# Patient Record
Sex: Female | Born: 1935
Health system: Southern US, Community
[De-identification: ages and names within clinical notes are randomized; demographics above are authoritative.]

## PROBLEM LIST (undated history)

## (undated) DIAGNOSIS — J189 Pneumonia, unspecified organism: Secondary | ICD-10-CM

## (undated) DIAGNOSIS — I10 Essential (primary) hypertension: Secondary | ICD-10-CM

## (undated) DIAGNOSIS — E039 Hypothyroidism, unspecified: Secondary | ICD-10-CM

## (undated) DIAGNOSIS — M7989 Other specified soft tissue disorders: Secondary | ICD-10-CM

## (undated) DIAGNOSIS — F32A Depression, unspecified: Secondary | ICD-10-CM

## (undated) DIAGNOSIS — I499 Cardiac arrhythmia, unspecified: Secondary | ICD-10-CM

## (undated) DIAGNOSIS — M1990 Unspecified osteoarthritis, unspecified site: Secondary | ICD-10-CM

## (undated) DIAGNOSIS — I509 Heart failure, unspecified: Secondary | ICD-10-CM

## (undated) DIAGNOSIS — Z923 Personal history of irradiation: Secondary | ICD-10-CM

## (undated) DIAGNOSIS — E079 Disorder of thyroid, unspecified: Secondary | ICD-10-CM

## (undated) DIAGNOSIS — M869 Osteomyelitis, unspecified: Secondary | ICD-10-CM

## (undated) DIAGNOSIS — Z8719 Personal history of other diseases of the digestive system: Secondary | ICD-10-CM

## (undated) DIAGNOSIS — M8580 Other specified disorders of bone density and structure, unspecified site: Secondary | ICD-10-CM

## (undated) DIAGNOSIS — K469 Unspecified abdominal hernia without obstruction or gangrene: Secondary | ICD-10-CM

## (undated) DIAGNOSIS — F329 Major depressive disorder, single episode, unspecified: Secondary | ICD-10-CM

## (undated) HISTORY — DX: Other specified soft tissue disorders: M79.89

## (undated) HISTORY — PX: BILATERAL SALPINGOOPHORECTOMY: SHX1223

## (undated) HISTORY — DX: Other specified disorders of bone density and structure, unspecified site: M85.80

## (undated) HISTORY — DX: Essential (primary) hypertension: I10

## (undated) HISTORY — DX: Disorder of thyroid, unspecified: E07.9

## (undated) HISTORY — DX: Unspecified abdominal hernia without obstruction or gangrene: K46.9

## (undated) HISTORY — DX: Osteomyelitis, unspecified: M86.9

---

## 1966-03-30 HISTORY — PX: APPENDECTOMY: SHX54

## 1968-03-30 HISTORY — PX: TUMOR REMOVAL: SHX12

## 1970-03-30 HISTORY — PX: ABDOMINAL HYSTERECTOMY: SHX81

## 1978-11-30 HISTORY — PX: CHOLECYSTECTOMY: SHX55

## 1997-11-30 HISTORY — PX: COLONOSCOPY: SHX174

## 1998-07-19 ENCOUNTER — Ambulatory Visit (HOSPITAL_COMMUNITY): Admission: RE | Admit: 1998-07-19 | Discharge: 1998-07-19 | Payer: Self-pay | Admitting: Gastroenterology

## 2001-04-21 ENCOUNTER — Encounter: Payer: Self-pay | Admitting: Family Medicine

## 2001-04-21 ENCOUNTER — Ambulatory Visit (HOSPITAL_COMMUNITY): Admission: RE | Admit: 2001-04-21 | Discharge: 2001-04-21 | Payer: Self-pay | Admitting: Family Medicine

## 2001-05-30 DIAGNOSIS — M869 Osteomyelitis, unspecified: Secondary | ICD-10-CM

## 2001-05-30 HISTORY — DX: Osteomyelitis, unspecified: M86.9

## 2002-05-12 ENCOUNTER — Emergency Department (HOSPITAL_COMMUNITY): Admission: EM | Admit: 2002-05-12 | Discharge: 2002-05-12 | Payer: Self-pay | Admitting: Emergency Medicine

## 2002-06-22 ENCOUNTER — Encounter: Payer: Self-pay | Admitting: Family Medicine

## 2002-06-22 ENCOUNTER — Ambulatory Visit (HOSPITAL_COMMUNITY): Admission: RE | Admit: 2002-06-22 | Discharge: 2002-06-22 | Payer: Self-pay | Admitting: Family Medicine

## 2002-06-29 ENCOUNTER — Ambulatory Visit (HOSPITAL_COMMUNITY): Admission: RE | Admit: 2002-06-29 | Discharge: 2002-06-29 | Payer: Self-pay | Admitting: Family Medicine

## 2003-06-28 ENCOUNTER — Ambulatory Visit (HOSPITAL_COMMUNITY): Admission: RE | Admit: 2003-06-28 | Discharge: 2003-06-28 | Payer: Self-pay | Admitting: Family Medicine

## 2003-06-28 ENCOUNTER — Encounter: Payer: Self-pay | Admitting: Family Medicine

## 2003-09-18 ENCOUNTER — Encounter: Payer: Self-pay | Admitting: Family Medicine

## 2003-09-18 ENCOUNTER — Ambulatory Visit (HOSPITAL_COMMUNITY): Admission: RE | Admit: 2003-09-18 | Discharge: 2003-09-18 | Payer: Self-pay | Admitting: Family Medicine

## 2003-11-06 ENCOUNTER — Ambulatory Visit (HOSPITAL_COMMUNITY): Admission: RE | Admit: 2003-11-06 | Discharge: 2003-11-06 | Payer: Self-pay | Admitting: Gastroenterology

## 2003-11-06 ENCOUNTER — Encounter (INDEPENDENT_AMBULATORY_CARE_PROVIDER_SITE_OTHER): Payer: Self-pay

## 2003-11-06 HISTORY — PX: COLONOSCOPY: SHX174

## 2004-08-20 ENCOUNTER — Ambulatory Visit (HOSPITAL_COMMUNITY): Admission: RE | Admit: 2004-08-20 | Discharge: 2004-08-20 | Payer: Self-pay | Admitting: Family Medicine

## 2005-04-14 ENCOUNTER — Ambulatory Visit (HOSPITAL_COMMUNITY): Admission: RE | Admit: 2005-04-14 | Discharge: 2005-04-14 | Payer: Self-pay | Admitting: Family Medicine

## 2005-09-11 ENCOUNTER — Ambulatory Visit (HOSPITAL_COMMUNITY): Admission: RE | Admit: 2005-09-11 | Discharge: 2005-09-11 | Payer: Self-pay | Admitting: Family Medicine

## 2005-11-11 ENCOUNTER — Ambulatory Visit: Payer: Self-pay | Admitting: Psychiatry

## 2005-11-30 HISTORY — PX: ESOPHAGOGASTRODUODENOSCOPY: SHX1529

## 2005-12-18 ENCOUNTER — Ambulatory Visit (HOSPITAL_COMMUNITY): Admission: RE | Admit: 2005-12-18 | Discharge: 2005-12-18 | Payer: Self-pay | Admitting: Internal Medicine

## 2005-12-18 ENCOUNTER — Ambulatory Visit: Payer: Self-pay | Admitting: Internal Medicine

## 2005-12-21 ENCOUNTER — Ambulatory Visit (HOSPITAL_COMMUNITY): Admission: RE | Admit: 2005-12-21 | Discharge: 2005-12-21 | Payer: Self-pay | Admitting: Family Medicine

## 2005-12-25 ENCOUNTER — Ambulatory Visit: Payer: Self-pay | Admitting: Psychiatry

## 2005-12-28 ENCOUNTER — Ambulatory Visit (HOSPITAL_COMMUNITY): Admission: RE | Admit: 2005-12-28 | Discharge: 2005-12-28 | Payer: Self-pay | Admitting: Family Medicine

## 2005-12-29 ENCOUNTER — Emergency Department (HOSPITAL_COMMUNITY): Admission: EM | Admit: 2005-12-29 | Discharge: 2005-12-29 | Payer: Self-pay | Admitting: Emergency Medicine

## 2006-01-08 ENCOUNTER — Ambulatory Visit: Payer: Self-pay | Admitting: Psychiatry

## 2006-01-28 HISTORY — PX: COLONOSCOPY: SHX174

## 2006-01-29 ENCOUNTER — Ambulatory Visit: Payer: Self-pay | Admitting: Psychiatry

## 2006-02-12 ENCOUNTER — Ambulatory Visit (HOSPITAL_COMMUNITY): Payer: Self-pay | Admitting: Psychiatry

## 2006-02-26 ENCOUNTER — Ambulatory Visit (HOSPITAL_COMMUNITY): Payer: Self-pay | Admitting: Psychiatry

## 2006-03-12 ENCOUNTER — Ambulatory Visit (HOSPITAL_COMMUNITY): Payer: Self-pay | Admitting: Psychiatry

## 2006-04-02 ENCOUNTER — Ambulatory Visit (HOSPITAL_COMMUNITY): Payer: Self-pay | Admitting: Psychiatry

## 2006-05-10 ENCOUNTER — Ambulatory Visit (HOSPITAL_COMMUNITY): Payer: Self-pay | Admitting: Psychiatry

## 2006-06-11 ENCOUNTER — Ambulatory Visit (HOSPITAL_COMMUNITY): Payer: Self-pay | Admitting: Psychiatry

## 2006-06-25 ENCOUNTER — Ambulatory Visit (HOSPITAL_COMMUNITY): Payer: Self-pay | Admitting: Psychiatry

## 2006-07-04 ENCOUNTER — Emergency Department (HOSPITAL_COMMUNITY): Admission: EM | Admit: 2006-07-04 | Discharge: 2006-07-04 | Payer: Self-pay | Admitting: Family Medicine

## 2006-09-22 ENCOUNTER — Ambulatory Visit (HOSPITAL_COMMUNITY): Admission: RE | Admit: 2006-09-22 | Discharge: 2006-09-22 | Payer: Self-pay | Admitting: Family Medicine

## 2006-12-06 ENCOUNTER — Ambulatory Visit (HOSPITAL_COMMUNITY): Payer: Self-pay | Admitting: Psychiatry

## 2006-12-27 ENCOUNTER — Ambulatory Visit (HOSPITAL_COMMUNITY): Payer: Self-pay | Admitting: Psychiatry

## 2007-01-10 ENCOUNTER — Ambulatory Visit (HOSPITAL_COMMUNITY): Payer: Self-pay | Admitting: Psychiatry

## 2007-02-01 ENCOUNTER — Ambulatory Visit (HOSPITAL_COMMUNITY): Payer: Self-pay | Admitting: Psychiatry

## 2007-02-21 ENCOUNTER — Ambulatory Visit (HOSPITAL_COMMUNITY): Payer: Self-pay | Admitting: Psychiatry

## 2007-03-21 ENCOUNTER — Ambulatory Visit (HOSPITAL_COMMUNITY): Payer: Self-pay | Admitting: Psychiatry

## 2007-09-27 ENCOUNTER — Ambulatory Visit (HOSPITAL_COMMUNITY): Admission: RE | Admit: 2007-09-27 | Discharge: 2007-09-27 | Payer: Self-pay | Admitting: Family Medicine

## 2008-09-27 ENCOUNTER — Ambulatory Visit (HOSPITAL_COMMUNITY): Admission: RE | Admit: 2008-09-27 | Discharge: 2008-09-27 | Payer: Self-pay | Admitting: Family Medicine

## 2008-11-13 ENCOUNTER — Ambulatory Visit: Payer: Self-pay | Admitting: Vascular Surgery

## 2008-12-06 ENCOUNTER — Ambulatory Visit: Payer: Self-pay | Admitting: Vascular Surgery

## 2008-12-20 ENCOUNTER — Ambulatory Visit: Payer: Self-pay | Admitting: Vascular Surgery

## 2009-03-30 DIAGNOSIS — M8580 Other specified disorders of bone density and structure, unspecified site: Secondary | ICD-10-CM

## 2009-03-30 HISTORY — DX: Other specified disorders of bone density and structure, unspecified site: M85.80

## 2009-04-23 ENCOUNTER — Ambulatory Visit (HOSPITAL_COMMUNITY): Admission: RE | Admit: 2009-04-23 | Discharge: 2009-04-23 | Payer: Self-pay | Admitting: Family Medicine

## 2009-10-09 ENCOUNTER — Ambulatory Visit (HOSPITAL_COMMUNITY): Admission: RE | Admit: 2009-10-09 | Discharge: 2009-10-09 | Payer: Self-pay | Admitting: Family Medicine

## 2009-12-02 ENCOUNTER — Ambulatory Visit (HOSPITAL_COMMUNITY): Admission: RE | Admit: 2009-12-02 | Discharge: 2009-12-02 | Payer: Self-pay | Admitting: Family Medicine

## 2010-06-03 ENCOUNTER — Emergency Department (HOSPITAL_COMMUNITY): Admission: EM | Admit: 2010-06-03 | Discharge: 2010-06-03 | Payer: Self-pay | Admitting: Family Medicine

## 2010-10-10 ENCOUNTER — Ambulatory Visit (HOSPITAL_COMMUNITY): Admission: RE | Admit: 2010-10-10 | Discharge: 2010-10-10 | Payer: Self-pay | Admitting: Family Medicine

## 2011-02-18 ENCOUNTER — Other Ambulatory Visit (HOSPITAL_COMMUNITY): Payer: Self-pay

## 2011-02-27 ENCOUNTER — Ambulatory Visit (HOSPITAL_COMMUNITY): Admission: RE | Admit: 2011-02-27 | Payer: Medicare Other | Source: Ambulatory Visit | Admitting: General Surgery

## 2011-04-14 NOTE — Procedures (Signed)
LOWER EXTREMITY VENOUS REFLUX EXAM   INDICATION:  Left lower extremity pain and spider veins.   EXAM:  Using color-flow imaging and pulse Doppler spectral analysis, the  left common femoral, superficial femoral, popliteal, posterior tibial,  greater and lesser saphenous veins are evaluated.  There is no evidence  suggesting deep venous insufficiency in the left lower extremity.   The left saphenofemoral junction is competent.  The left GSV is  competent with the caliber as described below.   The left proximal short saphenous vein demonstrates competency.   GSV Diameter (used if found to be incompetent only)                                            Right    Left  Proximal Greater Saphenous Vein           cm       cm  Proximal-to-mid-thigh                     cm       cm  Mid thigh                                 cm       cm  Mid-distal thigh                          cm       cm  Distal thigh                              cm       cm  Knee                                      cm       cm   IMPRESSION:  1. No evidence of reflux noted in the left leg.  2. The left greater saphenous vein is not aneurysmal.  3. The left greater saphenous vein is not tortuous.  4. The deep venous system is competent.  5. The left lesser saphenous vein is competent.  6. No evidence of deep venous thrombosis noted in the left leg.   ___________________________________________  Quita Skye. Hart Rochester, M.D.   MG/MEDQ  D:  11/14/2008  T:  11/14/2008  Job:  [id number]

## 2011-04-14 NOTE — Consult Note (Signed)
VASCULAR SURGERY CONSULTATION   [name], [name]  DOB:  May 29, 1936                                       11/13/2008  EAVWU#:[id number]   The patient is a 75 year old female referred for vascular surgery  consultation by Dr. Gerda Diss for venous insufficiency of the left leg.  This patient has noticed an increasing discomfort in the posterior  aspect of her left leg in the popliteal fossa which she describes as an  aching, cramping discomfort which worsens as the day progresses the more  she is on her feet.  She has occasional mild swelling in the ankles as  the day progresses but has no history of bleeding, ulceration, deep  venous thrombosis, thrombophlebitis or bulging varicosities.  She has no  symptoms in the contralateral right leg.  She has never had a venous  evaluation.  She has noticed a bluish discoloration behind her knee as  well as in her anterior thighs which have become more prominent over the  last several years.   PAST MEDICAL HISTORY:  1. Hypertension.  2. Thyroid disease.  3. Negative for diabetes, coronary artery disease, hyperlipidemia,      COPD or stroke.   PAST SURGICAL HISTORY:  1. Appendectomy.  2. Hiatal hernia repair.  3. Hysterectomy.  4. Removal of bilateral ovaries.   FAMILY HISTORY:  Positive for varicose veins in her mother, coronary  artery disease in her father, diabetes in a sister, negative for stroke.   SOCIAL HISTORY:  She is married, has one child and is retired.  She does  not use tobacco or alcohol.   REVIEW OF SYSTEMS:  Positive for recent weight gain.  She has some  occasional palpitations and peptic ulcer disease, hiatal hernia and some  depression and nervousness.   ALLERGIES:  None known.   MEDICATIONS:  Please see health history form.   PHYSICAL EXAMINATION:  Vital signs:  Blood pressure 167/81, heart rate  62, respirations 14.  General:  She is a female patient who is in no  apparent distress, alert and  oriented x3.  Neck:  Supple, 3+ carotid  pulses palpable.  No bruits are audible.  Neurological:  Normal.  Chest:  Clear to auscultation.  Cardiovascular:  Regular rhythm.  No murmurs.  Abdomen:  Soft, nontender with no masses.  She has 3+ femoral, popliteal  and dorsalis pedis pulses palpable bilaterally.  There is no distal  edema noted.  She does have some prominent reticular and spider veins in  the left popliteal fossa over about a 4 cm in circumference area.  She  also has scattered spider veins in the anterior thigh particularly  medially on the left side.  Right leg has lesser degree of spider veins,  no bulging varicosities noted in either leg.  There is no  hyperpigmentation, ulceration or distal edema noted.   Venous duplex exam of the left leg reveals the deep system to be normal  and no reflux in the superficial system with no valvular incompetence.   I reassured her regarding these findings that her saphenous systems are  normal.  She would like to have sclerotherapy performed for these  reticular and spider veins in the left leg.  We will schedule that in  the near future.   Quita Skye Hart Rochester, M.D.  Electronically Signed  JDL/MEDQ  D:  11/13/2008  T:  11/14/2008  Job:  [id number]   cc:   Lorin Picket A. Gerda Diss, MD

## 2011-04-17 NOTE — Op Note (Signed)
NAMEKHLOE, [name]               ACCOUNT NO.:  [id number]   MEDICAL RECORD NO.:  [id number]          PATIENT TYPE:  AMB   LOCATION:  DAY                           FACILITY:  [facility]   PHYSICIAN:  R. Roetta Sessions, M.D. DATE OF BIRTH:  [birth year]   DATE OF PROCEDURE:  12/18/2005  DATE OF DISCHARGE:                                 OPERATIVE REPORT   PROCEDURE:  Diagnostic esophagogastroduodenoscopy.   INDICATIONS FOR PROCEDURE:  The patient is a 75 year old lady with well  controlled GERD on generic omeprazole ___________ until the night before  last when she got nauseated and heaved several times and brought up a bunch  of black, dark-looking blood in the toilet. She also subsequently had some  darker brown stools the next day. She came to see Dr. Gerda Diss today and found  to her have dark stool, strongly Hemoccult positive, on rectal exam. She has  not had any hematochezia. No further hematemesis. She has remained  hemodynamically stable. She has a hemoglobin of 11 arranged per Dr. Lilyan Punt done through his office. Ms. Greaves has a distant history of peptic  ulcer disease for which she underwent therapeutic endoscopy back in 1990  felt to be related to ibuprofen. She is not taking any ibuprofen, etc., at  this time. She does not recall ever being checked for Helicobacter pylori.  EGD is now being done to further evaluate her symptoms. Approach has been  discussed with the patient at length. Potential risks, benefits, and  alternatives have been reviewed and questions answered. She is agreeable.  Please see documentation in the medical record.   PROCEDURE NOTE:  O2 saturation, blood pressure, pulse, and respirations were  monitored throughout the entire procedure. Conscious sedation with Versed 4  mg IV and Demerol 100 mg IV in divided doses.  Cetacaine spray for topical  oropharyngeal anesthesia.   INSTRUMENT:  Olympus video chip system.   FINDINGS:  Esophagogastroduodenoscopy:   Examination of the tubular esophagus  revealed no mucosa abnormalities. EG junction easily traversed.   Stomach:  Gastric cavity was empty and insufflated well with air. Thorough  examination of gastric mucosa including retroflexed view of the proximal  stomach and esophagogastric junction was undertaken. The patient had  moderate-sized hiatal hernia. The gastric mucosa straddling the  diaphragmatic hiatus was abnormal in three areas. There were three areas of  linear erosions/ulceration straddling the diaphragmatic hiatus perpendicular  to it. Please see photos taken in the retroflexed position. These eroded  ulcerated areas had clean bases. The remainder of the gastric mucosa  appeared normal. Pylorus patent and easily traversed. Examination of bulb  and second portion revealed no abnormalities.   THERAPEUTIC/DIAGNOSTIC MANEUVERS:  None.   The patient tolerated the procedure well and was reactive to endoscopy.   IMPRESSION:  1.  Normal esophagus.  2.  Small to moderate size hiatal hernia.  3.  Three linear eroded ulcerated areas of mucosa straddling the      diaphragmatic hiatus as described above. The remainder of the gastric      mucosa appeared normal. Patent pylorus. Normal D1  and D2.   DISCUSSION:  The appearance of these lesions and location are more  consistent with so-called Sheria Lang lesion which is more or less a trauma-  induced lesion involving the gastric mucosa from the mechanical forces of  sliding back and forth across the diaphragmatic hiatus.   There was no active bleeding seen today.   RECOMMENDATIONS:  1.  Dr. Lilyan Punt has written a prescription for Nexium. I agree with her      going on this medication. She should start it today, Nexium 40 mg orally      daily. Carafate 1 g slurries q.i.d. for the next five days.  2.  Given her documented history of peptic ulcer disease previously and no      history of being checked for Helicobacter pylori, we will go  ahead and      draw serologies today and will treat if positive. I feel Ms. Tieken's      outlook is good.  3.  I told Ms. Plush to check with Dr. Ewing Schlein down in [place] when she is      due for her next colonoscopy.      Jonathon Bellows, M.D.  Electronically Signed     RMR/MEDQ  D:  12/18/2005  T:  12/18/2005  Job:  [id number]   cc:   Lorin Picket A. Gerda Diss, MD  Fax: [phone]

## 2011-04-17 NOTE — Op Note (Signed)
NAME:  [name], [name]                         ACCOUNT NO.:  [id number]   MEDICAL RECORD NO.:  [id number]                   PATIENT TYPE:  AMB   LOCATION:  ENDO                                 FACILITY:  [facility]   PHYSICIAN:  Petra Kuba, M.D.                 DATE OF BIRTH:  [birth year]   DATE OF PROCEDURE:  11/06/2003  DATE OF DISCHARGE:                                 OPERATIVE REPORT   PROCEDURE:  Colonoscopy.   INDICATIONS FOR PROCEDURE:  Screening, history of colon polyps.   Consent was signed after risks, benefits, methods, and options were  thoroughly discussed in the past.   MEDICINES USED:  Demerol 100, Versed 10.   DESCRIPTION OF PROCEDURE:  Rectal inspection was pertinent for external  hemorrhoids small. Digital exam was negative. The pediatric video adjustable  colonoscope was inserted and with abdominal pressure and rolling her on her  back we were able to advance to the cecum. No abnormality was seen on  insertion.  The cecum was identified by the appendiceal orifice and the  ileocecal valve. The scope was slowly withdrawn. The prep was adequate.  There was some liquid stool that required washing and suctioning but on slow  withdrawal through the colon, the cecum, ascending, transverse and  descending were normal.  The scope was withdrawn around the left side of the  colon in the distal sigmoid.  A tiny polyp was seen and was cold biopsied,  also one in the rectum was seen probably hyperplastic, cold biopsied and put  in the same container.  Anorectal pull through and retroflexion confirmed  some small hemorrhoids.  The scope was reinserted a short ways up the left  side of the colon, air was suctioned, scope removed.  The patient tolerated  the procedure well. There was no obvious or immediate complications.   ENDOSCOPIC DIAGNOSIS:  1. Internal and external hemorrhoids.  2. Two tiny rectal distal sigmoid polyps cold biopsied.  3. Otherwise within normal limits to  the cecum.   PLAN:  Await pathology to determine future colonic screening.  Happy to see  back p.r.n. otherwise return care to Dr. Gerda Diss for the customary health  care maintenance to include yearly rectals and guaiacs.                                               Petra Kuba, M.D.    MEM/MEDQ  D:  11/06/2003  T:  11/06/2003  Job:  [id number]   cc:   Lorin Picket A. Gerda Diss, M.D.  [address], [address]  [place]  [place] [address]  Fax: [phone]

## 2011-11-03 ENCOUNTER — Other Ambulatory Visit: Payer: Self-pay | Admitting: Family Medicine

## 2011-11-03 DIAGNOSIS — Z139 Encounter for screening, unspecified: Secondary | ICD-10-CM

## 2011-11-12 ENCOUNTER — Ambulatory Visit (HOSPITAL_COMMUNITY)
Admission: RE | Admit: 2011-11-12 | Discharge: 2011-11-12 | Disposition: A | Discharge status: Home / Self Care | Payer: Medicare Other | Source: Ambulatory Visit | Attending: Family Medicine | Admitting: Family Medicine

## 2011-11-12 DIAGNOSIS — Z1231 Encounter for screening mammogram for malignant neoplasm of breast: Secondary | ICD-10-CM | POA: Insufficient documentation

## 2011-11-12 DIAGNOSIS — Z139 Encounter for screening, unspecified: Secondary | ICD-10-CM

## 2011-12-07 DIAGNOSIS — I10 Essential (primary) hypertension: Secondary | ICD-10-CM | POA: Diagnosis not present

## 2011-12-25 DIAGNOSIS — I10 Essential (primary) hypertension: Secondary | ICD-10-CM | POA: Diagnosis not present

## 2011-12-25 DIAGNOSIS — E039 Hypothyroidism, unspecified: Secondary | ICD-10-CM | POA: Diagnosis not present

## 2011-12-25 DIAGNOSIS — Z79899 Other long term (current) drug therapy: Secondary | ICD-10-CM | POA: Diagnosis not present

## 2012-01-11 DIAGNOSIS — Z0000 Encounter for general adult medical examination without abnormal findings: Secondary | ICD-10-CM | POA: Diagnosis not present

## 2012-04-08 DIAGNOSIS — E039 Hypothyroidism, unspecified: Secondary | ICD-10-CM | POA: Diagnosis not present

## 2012-04-15 ENCOUNTER — Ambulatory Visit (INDEPENDENT_AMBULATORY_CARE_PROVIDER_SITE_OTHER): Payer: Medicare Other | Admitting: Surgery

## 2012-04-15 ENCOUNTER — Encounter (INDEPENDENT_AMBULATORY_CARE_PROVIDER_SITE_OTHER): Payer: Self-pay | Admitting: Surgery

## 2012-04-15 VITALS — BP 128/70 | HR 68 | Temp 97.8°F | Resp 14 | Ht 65.5 in | Wt 188.0 lb

## 2012-04-15 DIAGNOSIS — K469 Unspecified abdominal hernia without obstruction or gangrene: Secondary | ICD-10-CM

## 2012-04-15 NOTE — Progress Notes (Signed)
Chief Complaint:  Right inguinal hernia x 3 years  History of Present Illness:  [name] is an 76 y.o. female referred by Dr. Lilyan Punt with a right inguinal hernia.  She saw Dr. Freida Busman last year who commented on what are probably ventral incisional hernias. These have been bothering her.  Physical exam shows a prominent right inguinal hernia that is reducible. She has an old midline incision with a bulge at the lower umbilicus it could be one or multiple hernias. He would be good to get a CT scan to see what is involved with these hernias whether they need to be repaired where they can be observed. We will therefore get a CT abdomen and pelvis with contrast and I will see her back after that and we will make disposition.  Past Medical History  Diagnosis Date  . Hypertension   . Hernia   . Thyroid disease   . Leg swelling     Past Surgical History  Procedure Date  . Appendectomy 03/1966  . Abdominal hysterectomy 03/1970  . Cholecystectomy 1980  . Tumor removal 03/1968    ovarian    Current Outpatient Prescriptions  Medication Sig Dispense Refill  . ALPRAZolam (XANAX) 0.5 MG tablet Take 0.5 mg by mouth as needed.      Marland Kitchen atenolol (TENORMIN) 50 MG tablet Take 50 mg by mouth daily.      . hydrochlorothiazide (HYDRODIURIL) 25 MG tablet Take 25 mg by mouth daily.      Marland Kitchen levothyroxine (SYNTHROID, LEVOTHROID) 125 MCG tablet Take 125 mcg by mouth daily.      . potassium chloride (K-DUR) 10 MEQ tablet Take 10 mEq by mouth every morning.      . ranitidine (ZANTAC) 300 MG capsule Take 300 mg by mouth every evening.      . venlafaxine XR (EFFEXOR-XR) 150 MG 24 hr capsule Take 150 mg by mouth daily.       Erythromycin; Aspirin; and Ibuprofen Family History  Problem Relation Age of Onset  . Dementia Mother   . Heart disease Father   . Cancer Father     throat  . Cancer Sister     breast   Social History:   reports that she has never smoked. She has never used smokeless tobacco. She  reports that she does not drink alcohol or use illicit drugs.   REVIEW OF SYSTEMS - PERTINENT POSITIVES ONLY: noncontributory  Physical Exam:   Blood pressure 128/70, pulse 68, temperature 97.8 F (36.6 C), temperature source Temporal, resp. rate 14, height 5' 5.5" (1.664 m), weight 188 lb (85.276 kg). Body mass index is 30.81 kg/(m^2).  Gen:  WDWN WF NAD  Neurological: Alert and oriented to person, place, and time. Motor and sensory function is grossly intact  Head: Normocephalic and atraumatic.  Eyes: Conjunctivae are normal. Pupils are equal, round, and reactive to light. No scleral icterus.  Neck: Normal range of motion. Neck supple. No tracheal deviation or thyromegaly present.  Cardiovascular:  SR without murmurs or gallops.  No carotid bruits Respiratory: Effort normal.  No respiratory distress. No chest wall tenderness. Breath sounds normal.  No wheezes, rales or rhonchi.  Abdomen:  Soft areas in lower midline with possible ventral herniae.  Right inguinal hernia is prominent GU: Musculoskeletal: Normal range of motion. Extremities are nontender. No cyanosis, edema or clubbing noted Lymphadenopathy: No cervical, preauricular, postauricular or axillary adenopathy is present Skin: Skin is warm and dry. No rash noted. No diaphoresis. No erythema. No  pallor. Pscyh: Normal mood and affect. Behavior is normal. Judgment and thought content normal.   LABORATORY RESULTS: No results found for this or any previous visit (from the past 48 hour(s)).  RADIOLOGY RESULTS: No results found.  Problem List: There is no problem list on file for this patient.   Assessment & Plan: RIH;  Asymptomatic ventral herniae- will get CT to assess what is in the herniae.      Matt B. Daphine Deutscher, MD, [facility] [phone] beeper [phone]  04/15/2012 3:47 PM

## 2012-04-19 ENCOUNTER — Ambulatory Visit
Admission: RE | Admit: 2012-04-19 | Discharge: 2012-04-19 | Disposition: A | Discharge status: Home / Self Care | Payer: Medicare Other | Source: Ambulatory Visit | Attending: Surgery | Admitting: Surgery

## 2012-04-19 DIAGNOSIS — K449 Diaphragmatic hernia without obstruction or gangrene: Secondary | ICD-10-CM | POA: Diagnosis not present

## 2012-04-19 DIAGNOSIS — K469 Unspecified abdominal hernia without obstruction or gangrene: Secondary | ICD-10-CM

## 2012-04-19 DIAGNOSIS — K4090 Unilateral inguinal hernia, without obstruction or gangrene, not specified as recurrent: Secondary | ICD-10-CM | POA: Diagnosis not present

## 2012-04-19 DIAGNOSIS — R109 Unspecified abdominal pain: Secondary | ICD-10-CM | POA: Diagnosis not present

## 2012-04-19 MED ORDER — IOHEXOL 300 MG/ML  SOLN
100.0000 mL | Freq: Once | INTRAMUSCULAR | Status: AC | PRN
  Administered 2012-04-19: 100 mL via INTRAVENOUS

## 2012-04-27 ENCOUNTER — Ambulatory Visit (INDEPENDENT_AMBULATORY_CARE_PROVIDER_SITE_OTHER): Payer: Medicare Other | Admitting: Surgery

## 2012-04-27 ENCOUNTER — Encounter (INDEPENDENT_AMBULATORY_CARE_PROVIDER_SITE_OTHER): Payer: Self-pay | Admitting: Surgery

## 2012-04-27 VITALS — BP 142/70 | HR 72 | Temp 98.0°F | Resp 18 | Ht 65.5 in | Wt 186.0 lb

## 2012-04-27 DIAGNOSIS — K4090 Unilateral inguinal hernia, without obstruction or gangrene, not specified as recurrent: Secondary | ICD-10-CM | POA: Insufficient documentation

## 2012-04-27 NOTE — Patient Instructions (Signed)
Nausea and vomiting his the hallmark of bowel obstruction. If you develop the symptomsthen feel your right groin make sure he doesn't have incarceration. If you develop the symptoms as always probably better to be checked out rather than try to consider them an intestinal virus.

## 2012-04-27 NOTE — Progress Notes (Signed)
[name] 76 y.o.  Body mass index is 30.48 kg/(m^2).  There is no problem list on file for this patient.   Allergies  Allergen Reactions  . Erythromycin Shortness Of Breath  . Aspirin Other (See Comments)    Caused internal bleeding. Patient had ulcer at the time. Instructed by PCP - Marca Ancona not to take any longer.  . Ibuprofen     Instructed not to take by PCP - Marca Ancona due to reaction to aspirin as well (precautionary).    Past Surgical History  Procedure Date  . Appendectomy 03/1966  . Abdominal hysterectomy 03/1970  . Cholecystectomy 1980  . Tumor removal 03/1968    Sheena Rake, MD, MD No diagnosis found.  [name] returns today and I reviewed her CT scan of her abdomen and pelvis and discussed the findings with her. She does have the right inguinal hernia and we knew she had. No ventral hernias were noted as suggested by Dr. Freida Busman. She does have a hiatal hernia and she has been managing very well for a number of years. She has had inguinal hernia for about 2 years. She is actually getting along very well with that I think would like to manage this without surgery. She knows what to look for in terms of symptoms of bowel obstruction tori be happy to see her again whenever needed should she change her mind will have surgery or if she should become symptomatic. I told her this was a perfectly good decisions and we would be glad to watch this with her. Plan return p.r.n.  Matt B. Daphine Deutscher, MD, [facility] [phone] beeper [phone]  04/27/2012 3:13 PM

## 2012-06-17 DIAGNOSIS — E039 Hypothyroidism, unspecified: Secondary | ICD-10-CM | POA: Diagnosis not present

## 2012-07-13 DIAGNOSIS — H2510 Age-related nuclear cataract, unspecified eye: Secondary | ICD-10-CM | POA: Diagnosis not present

## 2012-07-19 DIAGNOSIS — E039 Hypothyroidism, unspecified: Secondary | ICD-10-CM | POA: Diagnosis not present

## 2012-07-19 DIAGNOSIS — I10 Essential (primary) hypertension: Secondary | ICD-10-CM | POA: Diagnosis not present

## 2012-08-19 DIAGNOSIS — Z23 Encounter for immunization: Secondary | ICD-10-CM | POA: Diagnosis not present

## 2012-12-12 ENCOUNTER — Other Ambulatory Visit: Payer: Self-pay | Admitting: Family Medicine

## 2012-12-12 DIAGNOSIS — Z139 Encounter for screening, unspecified: Secondary | ICD-10-CM

## 2012-12-15 ENCOUNTER — Ambulatory Visit (HOSPITAL_COMMUNITY)
Admission: RE | Admit: 2012-12-15 | Discharge: 2012-12-15 | Disposition: A | Discharge status: Home / Self Care | Payer: Medicare Other | Source: Ambulatory Visit | Attending: Family Medicine | Admitting: Family Medicine

## 2012-12-15 DIAGNOSIS — Z1231 Encounter for screening mammogram for malignant neoplasm of breast: Secondary | ICD-10-CM | POA: Diagnosis not present

## 2012-12-15 DIAGNOSIS — Z139 Encounter for screening, unspecified: Secondary | ICD-10-CM

## 2013-02-01 DIAGNOSIS — Z79899 Other long term (current) drug therapy: Secondary | ICD-10-CM | POA: Diagnosis not present

## 2013-02-01 DIAGNOSIS — M899 Disorder of bone, unspecified: Secondary | ICD-10-CM | POA: Diagnosis not present

## 2013-02-01 DIAGNOSIS — I10 Essential (primary) hypertension: Secondary | ICD-10-CM | POA: Diagnosis not present

## 2013-02-01 DIAGNOSIS — E039 Hypothyroidism, unspecified: Secondary | ICD-10-CM | POA: Diagnosis not present

## 2013-02-01 DIAGNOSIS — E782 Mixed hyperlipidemia: Secondary | ICD-10-CM | POA: Diagnosis not present

## 2013-02-01 DIAGNOSIS — R5381 Other malaise: Secondary | ICD-10-CM | POA: Diagnosis not present

## 2013-02-04 ENCOUNTER — Encounter: Payer: Self-pay | Admitting: *Deleted

## 2013-04-25 DIAGNOSIS — L570 Actinic keratosis: Secondary | ICD-10-CM | POA: Diagnosis not present

## 2013-04-25 DIAGNOSIS — D485 Neoplasm of uncertain behavior of skin: Secondary | ICD-10-CM | POA: Diagnosis not present

## 2013-04-25 DIAGNOSIS — D0460 Carcinoma in situ of skin of unspecified upper limb, including shoulder: Secondary | ICD-10-CM | POA: Diagnosis not present

## 2013-04-25 DIAGNOSIS — L821 Other seborrheic keratosis: Secondary | ICD-10-CM | POA: Diagnosis not present

## 2013-05-11 DIAGNOSIS — C44621 Squamous cell carcinoma of skin of unspecified upper limb, including shoulder: Secondary | ICD-10-CM | POA: Diagnosis not present

## 2013-05-11 DIAGNOSIS — L570 Actinic keratosis: Secondary | ICD-10-CM | POA: Diagnosis not present

## 2013-05-16 ENCOUNTER — Ambulatory Visit (INDEPENDENT_AMBULATORY_CARE_PROVIDER_SITE_OTHER): Payer: Medicare Other | Admitting: Family Medicine

## 2013-05-16 ENCOUNTER — Encounter: Payer: Self-pay | Admitting: Family Medicine

## 2013-05-16 VITALS — BP 130/70 | HR 70 | Wt 188.0 lb

## 2013-05-16 DIAGNOSIS — Z0000 Encounter for general adult medical examination without abnormal findings: Secondary | ICD-10-CM

## 2013-05-16 DIAGNOSIS — E785 Hyperlipidemia, unspecified: Secondary | ICD-10-CM | POA: Diagnosis not present

## 2013-05-16 DIAGNOSIS — I10 Essential (primary) hypertension: Secondary | ICD-10-CM

## 2013-05-16 DIAGNOSIS — E039 Hypothyroidism, unspecified: Secondary | ICD-10-CM | POA: Insufficient documentation

## 2013-05-16 MED ORDER — ALPRAZOLAM 0.5 MG PO TABS: 0.5000 mg | ORAL_TABLET | Freq: Two times a day (BID) | ORAL | Status: DC | PRN

## 2013-05-16 NOTE — Progress Notes (Signed)
  Subjective:    Patient ID: [name], female    DOB: Apr 21, 1936, 77 y.o.   MRN: [id number]  HPI Patient here for a wellness physical. No falls in the last six months. MMSE score 28. WC is 39 inches. Patient states she has no concerns today. Patient overall is doing fairly well she is trying to lose weight is trying to watch diet is trying to exercise. Mental health doing well cognitive doing well safety reviewed dietary reviewed. Family history some neurologic disorders otherwise negative personal history-hypothyroidism, GERD, reflux.  Review of Systems  Constitutional: Negative for activity change, appetite change and fatigue.  HENT: Negative for congestion, rhinorrhea, neck pain and ear discharge.   Eyes: Negative for discharge.  Respiratory: Negative for cough, chest tightness and wheezing.   Cardiovascular: Negative for chest pain.  Gastrointestinal: Negative for vomiting and abdominal pain.  Genitourinary: Negative for frequency and difficulty urinating.  Allergic/Immunologic: Negative for environmental allergies and food allergies.  Neurological: Negative for weakness and headaches.  Psychiatric/Behavioral: Negative for behavioral problems and agitation.       Objective:   Physical Exam  Nursing note and vitals reviewed. Constitutional: She is oriented to person, place, and time. She appears well-developed and well-nourished.  HENT:  Head: Normocephalic.  Right Ear: External ear normal.  Left Ear: External ear normal.  Eyes: Pupils are equal, round, and reactive to light.  Neck: Normal range of motion. No thyromegaly present.  Cardiovascular: Normal rate, regular rhythm, normal heart sounds and intact distal pulses.   No murmur heard. Pulmonary/Chest: Effort normal and breath sounds normal. No respiratory distress. She has no wheezes.  Abdominal: Soft. Bowel sounds are normal. She exhibits no distension and no mass. There is no tenderness.  Genitourinary: Vagina  normal. No vaginal discharge found.  Musculoskeletal: Normal range of motion. She exhibits no edema and no tenderness.  Lymphadenopathy:    She has no cervical adenopathy.  Neurological: She is alert and oriented to person, place, and time. She exhibits normal muscle tone.  Skin: Skin is warm and dry.  Psychiatric: She has a normal mood and affect. Her behavior is normal.   Breast exam was normal.       Assessment & Plan:  Wellness-patient overall doing well continue current medications as is. Lab work toward the end of this year. Followup within 6 months. Safety measures dietary measures all reviewed.

## 2013-07-05 ENCOUNTER — Other Ambulatory Visit: Payer: Self-pay | Admitting: Family Medicine

## 2013-08-18 ENCOUNTER — Other Ambulatory Visit: Payer: Self-pay | Admitting: Family Medicine

## 2013-08-30 ENCOUNTER — Encounter: Payer: Self-pay | Admitting: Family Medicine

## 2013-08-30 ENCOUNTER — Ambulatory Visit (INDEPENDENT_AMBULATORY_CARE_PROVIDER_SITE_OTHER): Payer: Medicare Other | Admitting: Family Medicine

## 2013-08-30 VITALS — BP 122/72 | Temp 98.2°F | Ht 65.5 in | Wt 183.4 lb

## 2013-08-30 DIAGNOSIS — H6120 Impacted cerumen, unspecified ear: Secondary | ICD-10-CM | POA: Diagnosis not present

## 2013-08-30 DIAGNOSIS — H6123 Impacted cerumen, bilateral: Secondary | ICD-10-CM

## 2013-08-30 NOTE — Progress Notes (Signed)
  Subjective:    Patient ID: [name], female    DOB: [birth year], 77 y.o.   MRN: [id number]  Ear Fullness  There is pain in both ears. This is a new problem. The current episode started 1 to 4 weeks ago. The problem occurs constantly. The problem has been gradually worsening. There has been no fever. The patient is experiencing no pain. Associated symptoms include hearing loss. She has tried ear drops (Peroxide) for the symptoms. The treatment provided no relief.      Review of Systems  HENT: Positive for hearing loss.        Objective:   Physical Exam  Bilateral cerumen impaction sinus nontender throat normal neck supple      Assessment & Plan:  Cerumen impaction irrigated successfully both side patient now healed

## 2013-09-12 DIAGNOSIS — Z23 Encounter for immunization: Secondary | ICD-10-CM | POA: Diagnosis not present

## 2013-10-03 ENCOUNTER — Other Ambulatory Visit: Payer: Self-pay | Admitting: Family Medicine

## 2013-10-05 ENCOUNTER — Other Ambulatory Visit: Payer: Self-pay | Admitting: *Deleted

## 2013-10-05 MED ORDER — ALPRAZOLAM 0.5 MG PO TABS: 0.5000 mg | ORAL_TABLET | Freq: Two times a day (BID) | ORAL | Status: DC | PRN

## 2013-10-23 DIAGNOSIS — D485 Neoplasm of uncertain behavior of skin: Secondary | ICD-10-CM | POA: Diagnosis not present

## 2013-10-23 DIAGNOSIS — L570 Actinic keratosis: Secondary | ICD-10-CM | POA: Diagnosis not present

## 2013-10-23 DIAGNOSIS — Z85828 Personal history of other malignant neoplasm of skin: Secondary | ICD-10-CM | POA: Diagnosis not present

## 2013-12-01 ENCOUNTER — Encounter: Payer: Self-pay | Admitting: Family Medicine

## 2013-12-01 ENCOUNTER — Ambulatory Visit (INDEPENDENT_AMBULATORY_CARE_PROVIDER_SITE_OTHER): Payer: Medicare Other | Admitting: Family Medicine

## 2013-12-01 VITALS — BP 130/78 | Ht 65.5 in | Wt 188.4 lb

## 2013-12-01 DIAGNOSIS — E785 Hyperlipidemia, unspecified: Secondary | ICD-10-CM | POA: Diagnosis not present

## 2013-12-01 DIAGNOSIS — E039 Hypothyroidism, unspecified: Secondary | ICD-10-CM

## 2013-12-01 DIAGNOSIS — E559 Vitamin D deficiency, unspecified: Secondary | ICD-10-CM

## 2013-12-01 DIAGNOSIS — I10 Essential (primary) hypertension: Secondary | ICD-10-CM | POA: Diagnosis not present

## 2013-12-01 MED ORDER — ALPRAZOLAM 0.5 MG PO TABS: 0.5000 mg | ORAL_TABLET | Freq: Two times a day (BID) | ORAL | Status: DC | PRN

## 2013-12-01 MED ORDER — VENLAFAXINE HCL ER 150 MG PO CP24: 150.0000 mg | ORAL_CAPSULE | Freq: Every day | ORAL | Status: DC

## 2013-12-01 MED ORDER — POTASSIUM CHLORIDE CRYS ER 10 MEQ PO TBCR: 10.0000 meq | EXTENDED_RELEASE_TABLET | Freq: Every day | ORAL | Status: DC

## 2013-12-01 MED ORDER — RANITIDINE HCL 300 MG PO CAPS: 300.0000 mg | ORAL_CAPSULE | Freq: Every evening | ORAL | Status: DC

## 2013-12-01 MED ORDER — HYDROCHLOROTHIAZIDE 25 MG PO TABS: 25.0000 mg | ORAL_TABLET | Freq: Every day | ORAL | Status: DC

## 2013-12-01 MED ORDER — ATENOLOL 50 MG PO TABS
50.0000 mg | ORAL_TABLET | Freq: Every day | ORAL | Status: DC
Start: 2013-12-01 — End: 2014-06-19

## 2013-12-01 MED ORDER — LEVOTHYROXINE SODIUM 125 MCG PO TABS: 125.0000 ug | ORAL_TABLET | Freq: Every day | ORAL | Status: DC

## 2013-12-01 NOTE — Progress Notes (Signed)
   Subjective:    Patient ID: [name], female    DOB: March 28, 1936, 78 y.o.   MRN: [id number]  HPI Patient arrives for a follow up on blood pressure. Patient reports no concerns or problems-overall doing well. Long discussion held regarding her hypertension or hyperlipidemia hypothyroidism and vitamin D deficiency plus also the importance of exercise healthy eating taking medications on a regular basis patient denies any chest tightness pressure pain shortness breath nausea vomiting diarrhea  Review of Systems  Constitutional: Negative for activity change, appetite change and fatigue.  Endocrine: Negative for polydipsia and polyphagia.  Genitourinary: Negative for frequency.  Neurological: Negative for weakness.  Psychiatric/Behavioral: Negative for confusion.       Objective:   Physical Exam  Vitals reviewed. Constitutional: She appears well-nourished. No distress.  Cardiovascular: Normal rate, regular rhythm and normal heart sounds.   No murmur heard. Pulmonary/Chest: Effort normal and breath sounds normal. No respiratory distress.  Musculoskeletal: She exhibits no edema.  Lymphadenopathy:    She has no cervical adenopathy.  Neurological: She is alert. She exhibits normal muscle tone.  Psychiatric: Her behavior is normal.          Assessment & Plan:  hypothy-continue current medication check lab work. HTN-under good control continue current medication check lab work. Hyperlip-healthy diet regular exercise try to lose weight continue medication check lab work. Vit D def-history of this check vitamin D level may need supplementation Followup 6 months

## 2013-12-05 ENCOUNTER — Other Ambulatory Visit: Payer: Self-pay | Admitting: Family Medicine

## 2013-12-05 DIAGNOSIS — Z139 Encounter for screening, unspecified: Secondary | ICD-10-CM

## 2013-12-18 ENCOUNTER — Ambulatory Visit (HOSPITAL_COMMUNITY)
Admission: RE | Admit: 2013-12-18 | Discharge: 2013-12-18 | Disposition: A | Discharge status: Home / Self Care | Payer: Medicare Other | Source: Ambulatory Visit | Attending: Family Medicine | Admitting: Family Medicine

## 2013-12-18 DIAGNOSIS — Z1231 Encounter for screening mammogram for malignant neoplasm of breast: Secondary | ICD-10-CM | POA: Insufficient documentation

## 2013-12-18 DIAGNOSIS — Z139 Encounter for screening, unspecified: Secondary | ICD-10-CM

## 2014-04-24 DIAGNOSIS — Z85828 Personal history of other malignant neoplasm of skin: Secondary | ICD-10-CM | POA: Diagnosis not present

## 2014-04-24 DIAGNOSIS — L570 Actinic keratosis: Secondary | ICD-10-CM | POA: Diagnosis not present

## 2014-06-11 ENCOUNTER — Telehealth: Payer: Self-pay | Admitting: Family Medicine

## 2014-06-11 DIAGNOSIS — E039 Hypothyroidism, unspecified: Secondary | ICD-10-CM

## 2014-06-11 DIAGNOSIS — R5383 Other fatigue: Secondary | ICD-10-CM

## 2014-06-11 DIAGNOSIS — Z79899 Other long term (current) drug therapy: Secondary | ICD-10-CM

## 2014-06-11 DIAGNOSIS — R5381 Other malaise: Secondary | ICD-10-CM

## 2014-06-11 DIAGNOSIS — E785 Hyperlipidemia, unspecified: Secondary | ICD-10-CM

## 2014-06-11 NOTE — Telephone Encounter (Signed)
Has not had any labs since Epic.

## 2014-06-11 NOTE — Telephone Encounter (Signed)
bloodwork orders ready. Pt notified on voicemail.  

## 2014-06-11 NOTE — Telephone Encounter (Signed)
Pt has appt on 21 July, needs bw orders sent   Call when done

## 2014-06-11 NOTE — Telephone Encounter (Signed)
Lipid, liver, TSH, metabolic 7, CBC

## 2014-06-12 DIAGNOSIS — R5381 Other malaise: Secondary | ICD-10-CM | POA: Diagnosis not present

## 2014-06-12 DIAGNOSIS — E039 Hypothyroidism, unspecified: Secondary | ICD-10-CM | POA: Diagnosis not present

## 2014-06-12 DIAGNOSIS — Z79899 Other long term (current) drug therapy: Secondary | ICD-10-CM | POA: Diagnosis not present

## 2014-06-12 DIAGNOSIS — E785 Hyperlipidemia, unspecified: Secondary | ICD-10-CM | POA: Diagnosis not present

## 2014-06-12 DIAGNOSIS — R5383 Other fatigue: Secondary | ICD-10-CM | POA: Diagnosis not present

## 2014-06-12 LAB — CBC WITH DIFFERENTIAL/PLATELET
Basophils Absolute: 0 10*3/uL (ref 0.0–0.1)
Basophils Relative: 0 % (ref 0–1)
EOS PCT: 4 % (ref 0–5)
Eosinophils Absolute: 0.3 10*3/uL (ref 0.0–0.7)
HEMATOCRIT: 36.9 % (ref 36.0–46.0)
Hemoglobin: 12.7 g/dL (ref 12.0–15.0)
LYMPHS PCT: 47 % — AB (ref 12–46)
Lymphs Abs: 3.4 10*3/uL (ref 0.7–4.0)
MCH: 29.6 pg (ref 26.0–34.0)
MCHC: 34.4 g/dL (ref 30.0–36.0)
MCV: 86 fL (ref 78.0–100.0)
Monocytes Absolute: 0.6 10*3/uL (ref 0.1–1.0)
Monocytes Relative: 8 % (ref 3–12)
Neutro Abs: 3 10*3/uL (ref 1.7–7.7)
Neutrophils Relative %: 41 % — ABNORMAL LOW (ref 43–77)
PLATELETS: 335 10*3/uL (ref 150–400)
RBC: 4.29 MIL/uL (ref 3.87–5.11)
RDW: 13.1 % (ref 11.5–15.5)
WBC: 7.2 10*3/uL (ref 4.0–10.5)

## 2014-06-12 LAB — BASIC METABOLIC PANEL
BUN: 15 mg/dL (ref 6–23)
CO2: 31 mEq/L (ref 19–32)
Calcium: 9 mg/dL (ref 8.4–10.5)
Chloride: 102 mEq/L (ref 96–112)
Creat: 0.78 mg/dL (ref 0.50–1.10)
Glucose, Bld: 94 mg/dL (ref 70–99)
POTASSIUM: 4 meq/L (ref 3.5–5.3)
SODIUM: 141 meq/L (ref 135–145)

## 2014-06-12 LAB — LIPID PANEL
Cholesterol: 164 mg/dL (ref 0–200)
HDL: 69 mg/dL (ref >39)
LDL CALC: 74 mg/dL (ref 0–99)
Total CHOL/HDL Ratio: 2.4 Ratio
Triglycerides: 107 mg/dL (ref <150)
VLDL: 21 mg/dL (ref 0–40)

## 2014-06-12 LAB — HEPATIC FUNCTION PANEL
ALBUMIN: 3.9 g/dL (ref 3.5–5.2)
ALT: 16 U/L (ref 0–35)
AST: 18 U/L (ref 0–37)
Alkaline Phosphatase: 59 U/L (ref 39–117)
Bilirubin, Direct: 0.1 mg/dL (ref 0.0–0.3)
Indirect Bilirubin: 0.5 mg/dL (ref 0.2–1.2)
TOTAL PROTEIN: 6.5 g/dL (ref 6.0–8.3)
Total Bilirubin: 0.6 mg/dL (ref 0.2–1.2)

## 2014-06-12 LAB — TSH: TSH: 0.178 u[IU]/mL — ABNORMAL LOW (ref 0.350–4.500)

## 2014-06-19 ENCOUNTER — Encounter: Payer: Self-pay | Admitting: Family Medicine

## 2014-06-19 ENCOUNTER — Ambulatory Visit (INDEPENDENT_AMBULATORY_CARE_PROVIDER_SITE_OTHER): Payer: Medicare Other | Admitting: Family Medicine

## 2014-06-19 ENCOUNTER — Other Ambulatory Visit: Payer: Self-pay | Admitting: Family Medicine

## 2014-06-19 VITALS — BP 122/82 | Ht 65.5 in | Wt 186.6 lb

## 2014-06-19 DIAGNOSIS — I10 Essential (primary) hypertension: Secondary | ICD-10-CM

## 2014-06-19 DIAGNOSIS — M899 Disorder of bone, unspecified: Secondary | ICD-10-CM

## 2014-06-19 DIAGNOSIS — K2990 Gastroduodenitis, unspecified, without bleeding: Secondary | ICD-10-CM

## 2014-06-19 DIAGNOSIS — M8580 Other specified disorders of bone density and structure, unspecified site: Secondary | ICD-10-CM

## 2014-06-19 DIAGNOSIS — M949 Disorder of cartilage, unspecified: Secondary | ICD-10-CM

## 2014-06-19 DIAGNOSIS — Z23 Encounter for immunization: Secondary | ICD-10-CM

## 2014-06-19 DIAGNOSIS — E785 Hyperlipidemia, unspecified: Secondary | ICD-10-CM

## 2014-06-19 DIAGNOSIS — K2970 Gastritis, unspecified, without bleeding: Secondary | ICD-10-CM

## 2014-06-19 DIAGNOSIS — E039 Hypothyroidism, unspecified: Secondary | ICD-10-CM

## 2014-06-19 MED ORDER — VENLAFAXINE HCL ER 150 MG PO CP24: 150.0000 mg | ORAL_CAPSULE | Freq: Every day | ORAL | Status: DC

## 2014-06-19 MED ORDER — LEVOTHYROXINE SODIUM 112 MCG PO TABS: 112.0000 ug | ORAL_TABLET | Freq: Every day | ORAL | Status: DC

## 2014-06-19 MED ORDER — ALPRAZOLAM 0.5 MG PO TABS: 0.5000 mg | ORAL_TABLET | Freq: Two times a day (BID) | ORAL | Status: DC | PRN

## 2014-06-19 MED ORDER — HYDROCHLOROTHIAZIDE 25 MG PO TABS: 25.0000 mg | ORAL_TABLET | Freq: Every day | ORAL | Status: DC

## 2014-06-19 MED ORDER — POTASSIUM CHLORIDE CRYS ER 10 MEQ PO TBCR: 10.0000 meq | EXTENDED_RELEASE_TABLET | Freq: Every day | ORAL | Status: DC

## 2014-06-19 MED ORDER — ATENOLOL 50 MG PO TABS: 50.0000 mg | ORAL_TABLET | Freq: Every day | ORAL | Status: DC

## 2014-06-19 MED ORDER — PANTOPRAZOLE SODIUM 40 MG PO TBEC: 40.0000 mg | DELAYED_RELEASE_TABLET | Freq: Every day | ORAL | Status: DC

## 2014-06-19 NOTE — Progress Notes (Signed)
   Subjective:    Patient ID: [name], female    DOB: 16-May-1936, 78 y.o.   MRN: [id number]  Hypertension This is a new problem. The current episode started more than 1 year ago. Risk factors for coronary artery disease include dyslipidemia and post-menopausal state. Treatments tried: atenolol, HCTZ. There are no compliance problems.    Intermittent spells: sick on stomach, dry heaves, no pain, occasionally occurs, sometimes triggered by foods, nothing helps it, sometimes will occur a few hours , bowels ok, no blood, has awaken her before, has tried pepto bismol helped before.had black stool earlier this week Patient states she doesn't have as much energy as she used to have.  She denies being depressed states her medicine helps her.  Review of Systems  Constitutional: Negative for activity change, appetite change and fatigue.  Endocrine: Negative for polydipsia and polyphagia.  Genitourinary: Negative for frequency.  Neurological: Negative for weakness.  Psychiatric/Behavioral: Negative for confusion.       Objective:   Physical Exam  Vitals reviewed. Constitutional: She appears well-nourished. No distress.  Cardiovascular: Normal rate, regular rhythm and normal heart sounds.   No murmur heard. Pulmonary/Chest: Effort normal and breath sounds normal. No respiratory distress.  Abdominal: Soft. She exhibits no distension. There is no tenderness.  Musculoskeletal: She exhibits no edema.  Lymphadenopathy:    She has no cervical adenopathy.  Neurological: She is alert. She exhibits normal muscle tone.  Psychiatric: Her behavior is normal.    Abdomen is soft there is no guarding or rebound      Assessment & Plan:  Abdominal symptoms with intermittent symptoms along with having a black stool I believe this patient needs evaluation by gastroenterology along with EGD. Hemoglobin looks good today. Patient is stable. Stop ranitidine. Start protonix 40 mg daily.  HTN stable  monitor her followup 6 months  Osteopenia check bone density  Hypothyroidism-needs lower dose recheck TSH in 6 months  Hyperlipidemia good control 25 minutes spent with patient reviewing and updating her medicines

## 2014-06-25 ENCOUNTER — Ambulatory Visit (HOSPITAL_COMMUNITY)
Admission: RE | Admit: 2014-06-25 | Discharge: 2014-06-25 | Disposition: A | Discharge status: Home / Self Care | Payer: Medicare Other | Source: Ambulatory Visit | Attending: Family Medicine | Admitting: Family Medicine

## 2014-06-25 DIAGNOSIS — Z780 Asymptomatic menopausal state: Secondary | ICD-10-CM | POA: Diagnosis not present

## 2014-06-25 DIAGNOSIS — M949 Disorder of cartilage, unspecified: Principal | ICD-10-CM

## 2014-06-25 DIAGNOSIS — M899 Disorder of bone, unspecified: Secondary | ICD-10-CM | POA: Insufficient documentation

## 2014-06-28 ENCOUNTER — Telehealth: Payer: Self-pay | Admitting: Internal Medicine

## 2014-06-28 NOTE — Telephone Encounter (Signed)
I made OV with AS on 8/3 at 230 and LMOM for patient to call me back to confirm she got my message.

## 2014-06-28 NOTE — Telephone Encounter (Signed)
Dr Wolfgang Phoenix has sent an URG referral to my work queue for this patient. She has sever reflux, possible ulcer and needs EGD. PCP wants pt seen within the next 10-12 days. Please advise where I can put this patient in an URG spot

## 2014-06-28 NOTE — Telephone Encounter (Signed)
There appears to be a regular appt on AS's schedule for 8/3 at 2:30.

## 2014-07-02 ENCOUNTER — Ambulatory Visit (INDEPENDENT_AMBULATORY_CARE_PROVIDER_SITE_OTHER): Payer: Medicare Other | Admitting: Gastroenterology

## 2014-07-02 ENCOUNTER — Encounter: Payer: Self-pay | Admitting: Gastroenterology

## 2014-07-02 VITALS — BP 142/71 | HR 55 | Temp 97.2°F | Resp 18 | Ht 65.0 in | Wt 189.0 lb

## 2014-07-02 DIAGNOSIS — K219 Gastro-esophageal reflux disease without esophagitis: Secondary | ICD-10-CM

## 2014-07-02 DIAGNOSIS — Z1211 Encounter for screening for malignant neoplasm of colon: Secondary | ICD-10-CM | POA: Diagnosis not present

## 2014-07-02 NOTE — Assessment & Plan Note (Signed)
Reportedly last in 2007 per patient; however, epic records state 2004. Will need to retrieve from Dr. Perley Jain office. Obtain ifobt. If positive, consider early interval colonoscopy.

## 2014-07-02 NOTE — Assessment & Plan Note (Signed)
78 year old female with intermittent nausea, dry heaves, presenting with significant improvement after starting PPI therapy in the way of Protonix; also noted possible black stool in late June but no evidence of anemia on CBC at that time. Question if true melena; will obtain an ifobt. May need EGD if any further evidence or recurrent symptoms despite PPI. No acute findings today. Last EGD in 2007 with [facility] lesions. Close follow-up at this time. Obtain ifobt with further recommendations to follow.

## 2014-07-02 NOTE — Patient Instructions (Signed)
Continue to take Protonix once each morning, 30 minutes before breakfast.   Review the diet handout attached.   Please complete the stool sample. If this is positive for blood, we need to consider a colonoscopy with possible upper endoscopy if you have recurrent nausea, vomiting.   Further recommendations to follow!  Gastroesophageal Reflux Disease, Adult Gastroesophageal reflux disease (GERD) happens when acid from your stomach flows up into the esophagus. When acid comes in contact with the esophagus, the acid causes soreness (inflammation) in the esophagus. Over time, GERD may create small holes (ulcers) in the lining of the esophagus. CAUSES   Increased body weight. This puts pressure on the stomach, making acid rise from the stomach into the esophagus.  Smoking. This increases acid production in the stomach.  Drinking alcohol. This causes decreased pressure in the lower esophageal sphincter (valve or ring of muscle between the esophagus and stomach), allowing acid from the stomach into the esophagus.  Late evening meals and a full stomach. This increases pressure and acid production in the stomach.  A malformed lower esophageal sphincter. Sometimes, no cause is found. SYMPTOMS   Burning pain in the lower part of the mid-chest behind the breastbone and in the mid-stomach area. This may occur twice a week or more often.  Trouble swallowing.  Sore throat.  Dry cough.  Asthma-like symptoms including chest tightness, shortness of breath, or wheezing. DIAGNOSIS  Your caregiver may be able to diagnose GERD based on your symptoms. In some cases, X-rays and other tests may be done to check for complications or to check the condition of your stomach and esophagus. TREATMENT  Your caregiver may recommend over-the-counter or prescription medicines to help decrease acid production. Ask your caregiver before starting or adding any new medicines.  HOME CARE INSTRUCTIONS   Change the  factors that you can control. Ask your caregiver for guidance concerning weight loss, quitting smoking, and alcohol consumption.  Avoid foods and drinks that make your symptoms worse, such as:  Caffeine or alcoholic drinks.  Chocolate.  Peppermint or mint flavorings.  Garlic and onions.  Spicy foods.  Citrus fruits, such as oranges, lemons, or limes.  Tomato-based foods such as sauce, chili, salsa, and pizza.  Fried and fatty foods.  Avoid lying down for the 3 hours prior to your bedtime or prior to taking a nap.  Eat small, frequent meals instead of large meals.  Wear loose-fitting clothing. Do not wear anything tight around your waist that causes pressure on your stomach.  Raise the head of your bed 6 to 8 inches with wood blocks to help you sleep. Extra pillows will not help.  Only take over-the-counter or prescription medicines for pain, discomfort, or fever as directed by your caregiver.  Do not take aspirin, ibuprofen, or other nonsteroidal anti-inflammatory drugs (NSAIDs). SEEK IMMEDIATE MEDICAL CARE IF:   You have pain in your arms, neck, jaw, teeth, or back.  Your pain increases or changes in intensity or duration.  You develop nausea, vomiting, or sweating (diaphoresis).  You develop shortness of breath, or you faint.  Your vomit is green, yellow, black, or looks like coffee grounds or blood.  Your stool is red, bloody, or black. These symptoms could be signs of other problems, such as heart disease, gastric bleeding, or esophageal bleeding. MAKE SURE YOU:   Understand these instructions.  Will watch your condition.  Will get help right away if you are not doing well or get worse. Document Released: 08/26/2005 Document Revised: 02/08/2012  Document Reviewed: 06/05/2011 [facility] Patient Information 2015 [place]. This information is not intended to replace advice given to you by your health care provider. Make sure you discuss any questions you have  with your health care provider.

## 2014-07-02 NOTE — Progress Notes (Signed)
Primary Care Physician:  Sallee Lange, MD Primary Gastroenterologist:  Dr. Gala Romney   Chief Complaint  Patient presents with  . Referral    HPI:   [name] presents today at the request of Dr. Wolfgang Phoenix secondary to nausea, dry heaves, possible melena. Has multiple BMs per day but not diarrhea, usually after eating. Twice today already. Ate oatmeal for breakfast. Notes history of chronic  intermittent nausea, dry heaves, will have yellow emesis. Has woken up with nausea several times. Lower abdominal discomfort at times, sometimes at night. Usually after eating late. Was laying on back, turned over and relieved. Not routine or pattern to it. Saw black stool late June. Hgb at that time normal at 12.7. 3 days later was normal color. No dysphagia. No lack of appetite. No NSAIDs. Only takes tylenol if needed. No hematochezia. History of H.pylori in past per patient. Protonix started a few weeks ago. No nausea since day prior to physical. Reflux improved with PPI. Symptoms actually improved/resolved with Protonix.   Last EGD in 2007 with normal esophagus, small to moderate hiatal hernia, Cameron lesions. Last colonoscopy possibly in 2007. Was told repeat in 2017.   Past Medical History  Diagnosis Date  . Hypertension   . Hernia   . Thyroid disease   . Leg swelling   . Hernia     right inguinal hernia  . Osteopenia May 2010  . Osteitis pubis July 2002    Past Surgical History  Procedure Laterality Date  . Appendectomy  03/1966  . Abdominal hysterectomy  03/1970  . Cholecystectomy  1980  . Tumor removal  03/1968    ovarian  . Colonoscopy  1999  . Colonoscopy  March 2007    Repeat 10 years  . Esophagogastroduodenoscopy  2007    Dr. Gala Romney: normal esophagus, small tom oderate sized hiatal hernia, Cameron lesions    Current Outpatient Prescriptions  Medication Sig Dispense Refill  . ALPRAZolam (XANAX) 0.5 MG tablet Take 1 tablet (0.5 mg total) by mouth 2 (two) times daily as  needed for sleep. One bid prn  60 tablet  3  . atenolol (TENORMIN) 50 MG tablet Take 1 tablet (50 mg total) by mouth daily.  90 tablet  1  . Calcium Carbonate-Vitamin D (CALTRATE 600+D PO) Take by mouth.      . Cholecalciferol (VITAMIN D3) 2000 UNITS TABS Take by mouth.      . hydrochlorothiazide (HYDRODIURIL) 25 MG tablet Take 1 tablet (25 mg total) by mouth daily.  90 tablet  1  . levothyroxine (SYNTHROID, LEVOTHROID) 112 MCG tablet Take 1 tablet (112 mcg total) by mouth daily.  90 tablet  3  . pantoprazole (PROTONIX) 40 MG tablet Take 1 tablet (40 mg total) by mouth daily.  30 tablet  3  . potassium chloride (KLOR-CON M10) 10 MEQ tablet Take 1 tablet (10 mEq total) by mouth daily.  90 tablet  1  . venlafaxine XR (EFFEXOR-XR) 150 MG 24 hr capsule Take 1 capsule (150 mg total) by mouth daily.  90 capsule  1   No current facility-administered medications for this visit.    Allergies as of 07/02/2014 - Review Complete 07/02/2014  Allergen Reaction Noted  . Erythromycin Shortness Of Breath 04/15/2012  . Aspirin Other (See Comments) 04/15/2012  . Ibuprofen  04/15/2012    Family History  Problem Relation Age of Onset  . Dementia Mother   . Heart disease Father   . Cancer Father  throat  . Cancer Sister     breast  . Colon cancer Neg Hx     History   Social History  . Marital Status: Married    Spouse Name: N/A    Number of Children: N/A  . Years of Education: N/A   Occupational History  . Retired     Orthoptist, retired in [place]  . Smoking status: Never Smoker   . Smokeless tobacco: Never Used  . Alcohol Use: Yes     Comment: occasional margarita  . Drug Use: No  . Sexual Activity: Not on file   Other Topics Concern  . Not on file   Social History Narrative  . No narrative on file    Review of Systems: Negative unless mentioned in HPI  Physical Exam: BP 142/71  Pulse 55  Temp(Src) 97.2 F (36.2 C) (Oral)  Resp 18  Ht 5\' 5"   (1.651 m)  Wt 189 lb (85.73 kg)  BMI 31.45 kg/m2 General:   Alert and oriented. Pleasant and cooperative. Well-nourished and well-developed.  Head:  Normocephalic and atraumatic. Eyes:  Without icterus, sclera clear and conjunctiva pink.  Ears:  Normal auditory acuity. Nose:  No deformity, discharge,  or lesions. Mouth:  No deformity or lesions, oral mucosa pink.  Lungs:  Clear to auscultation bilaterally. No wheezes, rales, or rhonchi. No distress.  Heart:  S1, S2 present without murmurs appreciated.  Abdomen:  +BS, soft, non-tender and non-distended. No HSM noted. No guarding or rebound. No masses appreciated.  Rectal:  Deferred  Msk:  Symmetrical without gross deformities. Normal posture. Extremities:  Without clubbing or edema. Neurologic:  Alert and  oriented x4;  grossly normal neurologically. Skin:  Intact without significant lesions or rashes. Psych:  Alert and cooperative. Normal mood and affect.   Lab Results  Component Value Date   WBC 7.2 06/12/2014   HGB 12.7 06/12/2014   HCT 36.9 06/12/2014   MCV 86.0 06/12/2014   PLT 335 06/12/2014   Lab Results  Component Value Date   ALT 16 06/12/2014   AST 18 06/12/2014   ALKPHOS 59 06/12/2014   BILITOT 0.6 06/12/2014   Lab Results  Component Value Date   CREATININE 0.78 06/12/2014   BUN 15 06/12/2014   NA 141 06/12/2014   K 4.0 06/12/2014   CL 102 06/12/2014   CO2 31 06/12/2014

## 2014-07-03 NOTE — Progress Notes (Signed)
Cc to pcp °

## 2014-07-10 ENCOUNTER — Ambulatory Visit (INDEPENDENT_AMBULATORY_CARE_PROVIDER_SITE_OTHER): Payer: Medicare Other

## 2014-07-10 DIAGNOSIS — Z1211 Encounter for screening for malignant neoplasm of colon: Secondary | ICD-10-CM

## 2014-07-10 LAB — IFOBT (OCCULT BLOOD): IMMUNOLOGICAL FECAL OCCULT BLOOD TEST: POSITIVE

## 2014-07-10 NOTE — Progress Notes (Signed)
Pt returned IFOBT test and it was POSITIVE

## 2014-07-25 NOTE — Progress Notes (Signed)
Quick Note:  ifobt positive.  Recommend TCS+/-EGD with Dr. Gala Romney.  Last colonoscopy over 5 years ago. ______

## 2014-07-27 ENCOUNTER — Telehealth: Payer: Self-pay | Admitting: Internal Medicine

## 2014-07-27 NOTE — Telephone Encounter (Signed)
Tried to call her back-no answer

## 2014-07-27 NOTE — Telephone Encounter (Signed)
Pt was returning JL call from yesterday. I told her JL wouldn't be back until Monday and I would have her return her call. [phone]

## 2014-07-31 NOTE — Progress Notes (Signed)
Quick Note:  Noted. Will not charge for new H&P as it took longer to address as it was completed during my vacation time. ______

## 2014-07-31 NOTE — Telephone Encounter (Signed)
Spoke with pt

## 2014-08-13 ENCOUNTER — Other Ambulatory Visit: Payer: Self-pay

## 2014-08-13 ENCOUNTER — Ambulatory Visit (INDEPENDENT_AMBULATORY_CARE_PROVIDER_SITE_OTHER): Payer: Medicare Other | Admitting: Gastroenterology

## 2014-08-13 ENCOUNTER — Encounter: Payer: Self-pay | Admitting: Gastroenterology

## 2014-08-13 VITALS — BP 121/70 | HR 73 | Temp 97.4°F | Ht 65.5 in | Wt 190.2 lb

## 2014-08-13 DIAGNOSIS — K921 Melena: Secondary | ICD-10-CM

## 2014-08-13 DIAGNOSIS — K219 Gastro-esophageal reflux disease without esophagitis: Secondary | ICD-10-CM

## 2014-08-13 DIAGNOSIS — R195 Other fecal abnormalities: Secondary | ICD-10-CM | POA: Insufficient documentation

## 2014-08-13 MED ORDER — PEG 3350-KCL-NA BICARB-NACL 420 G PO SOLR: 4000.0000 mL | ORAL | Status: DC

## 2014-08-13 NOTE — Progress Notes (Signed)
Referring Provider: Kathyrn Drown, MD Primary Care Physician:  Sallee Lange, MD Primary GI: Dr. Gala Romney   Chief Complaint  Patient presents with  . Follow-up    doing ok    HPI:   [name] presents today secondary to nausea, dry heaves, possible melena earlier this summer but normal Hgb at that time. Notes once incidence of black stool late June but normal thereafter. GERD symptoms improved with addition of PPI in Aug 2015. Ifobt completed and positive. No further evidence of melena. No hematochezia. One spell of vomiting since last seen. Thinks it was something she ate. No constipation or diarrhea.   Last EGD in 2007 with normal esophagus, small to moderate hiatal hernia, Cameron lesions. Last colonoscopy possibly in 2007 by Dr. Watt Climes. Was told repeat in 2017.  Past Medical History  Diagnosis Date  . Hypertension   . Hernia   . Thyroid disease   . Leg swelling   . Hernia     right inguinal hernia  . Osteopenia May 2010  . Osteitis pubis July 2002    Past Surgical History  Procedure Laterality Date  . Appendectomy  03/1966  . Abdominal hysterectomy  03/1970  . Cholecystectomy  1980  . Tumor removal  03/1968    ovarian  . Colonoscopy  1999  . Colonoscopy  March 2007    Repeat 10 years  . Esophagogastroduodenoscopy  2007    Dr. Gala Romney: normal esophagus, small tom oderate sized hiatal hernia, Cameron lesions  . Colonoscopy  11/06/2003     Internal and external hemorrhoids/Two tiny rectal distal sigmoid polyps cold biopsied/Otherwise within normal limits to the cecum    Current Outpatient Prescriptions  Medication Sig Dispense Refill  . ALPRAZolam (XANAX) 0.5 MG tablet Take 1 tablet (0.5 mg total) by mouth 2 (two) times daily as needed for sleep. One bid prn  60 tablet  3  . atenolol (TENORMIN) 50 MG tablet Take 1 tablet (50 mg total) by mouth daily.  90 tablet  1  . Calcium Carbonate-Vitamin D (CALTRATE 600+D PO) Take by mouth.      . Cholecalciferol (VITAMIN D3)  2000 UNITS TABS Take by mouth.      . hydrochlorothiazide (HYDRODIURIL) 25 MG tablet Take 1 tablet (25 mg total) by mouth daily.  90 tablet  1  . levothyroxine (SYNTHROID, LEVOTHROID) 112 MCG tablet Take 1 tablet (112 mcg total) by mouth daily.  90 tablet  3  . pantoprazole (PROTONIX) 40 MG tablet Take 1 tablet (40 mg total) by mouth daily.  30 tablet  3  . potassium chloride (KLOR-CON M10) 10 MEQ tablet Take 1 tablet (10 mEq total) by mouth daily.  90 tablet  1  . venlafaxine XR (EFFEXOR-XR) 150 MG 24 hr capsule Take 1 capsule (150 mg total) by mouth daily.  90 capsule  1   No current facility-administered medications for this visit.    Allergies as of 08/13/2014 - Review Complete 08/13/2014  Allergen Reaction Noted  . Erythromycin Shortness Of Breath 04/15/2012  . Aspirin Other (See Comments) 04/15/2012  . Ibuprofen  04/15/2012    Family History  Problem Relation Age of Onset  . Dementia Mother   . Heart disease Father   . Cancer Father     throat  . Cancer Sister     breast  . Colon cancer Neg Hx     History   Social History  . Marital Status: Married    Spouse Name: N/A  Number of Children: N/A  . Years of Education: N/A   Occupational History  . Retired     Orthoptist, retired in [place]  . Smoking status: Never Smoker   . Smokeless tobacco: Never Used  . Alcohol Use: Yes     Comment: occasional margarita  . Drug Use: No  . Sexual Activity: None   Other Topics Concern  . None   Social History Narrative  . None    Review of Systems: As mentioned in HPI  Physical Exam: BP 121/70  Pulse 73  Temp(Src) 97.4 F (36.3 C) (Oral)  Ht 5' 5.5" (1.664 m)  Wt 190 lb 3.2 oz (86.274 kg)  BMI 31.16 kg/m2 General:   Alert and oriented. No distress noted. Pleasant and cooperative.  Head:  Normocephalic and atraumatic. Eyes:  Conjuctiva clear without scleral icterus. Mouth:  Oral mucosa pink and moist. Good dentition. No lesions. Heart:   S1, S2 present without murmurs, rubs, or gallops. Regular rate and rhythm. Abdomen:  +BS, soft, non-tender and non-distended. No rebound or guarding. No HSM or masses noted. Msk:  Symmetrical without gross deformities. Normal posture. Extremities:  Without edema. Neurologic:  Alert and  oriented x4;  grossly normal neurologically. Skin:  Intact without significant lesions or rashes. Psych:  Alert and cooperative. Normal mood and affect.  Lab Results  Component Value Date   WBC 7.2 06/12/2014   HGB 12.7 06/12/2014   HCT 36.9 06/12/2014   MCV 86.0 06/12/2014   PLT 335 06/12/2014

## 2014-08-13 NOTE — Patient Instructions (Signed)
We have scheduled you for a colonoscopy and possible upper endoscopy with Dr. Gala Romney in the near future.  Continue Protonix once daily, 30 minutes before breakfast.

## 2014-08-14 NOTE — Assessment & Plan Note (Signed)
78 year old female with heme positive stool with a question of black stool earlier this summer but normal Hgb at that time; this was an isolated incidence and question if truly melena. No hematochezia or other lower GI symptoms. Intermittent N/V noted earlier this summer but with significant improvement since addition of PPI in Aug 2015. With last colonoscopy around 2007, needs updated lower GI evaluation due to heme positive stool. Discussed possible upper endoscopy at time of colonoscopy if further nausea and vomiting despite PPI.   Proceed with TCS +/- EGD with Dr. Gala Romney in near future: the risks, benefits, and alternatives have been discussed with the patient in detail. The patient states understanding and desires to proceed. Continue Protonix daily

## 2014-08-14 NOTE — Progress Notes (Signed)
cc'ed to pcp °

## 2014-08-14 NOTE — Assessment & Plan Note (Signed)
Continue Protonix daily. Excellent improvement in N/V with addition of PPI.

## 2014-08-24 ENCOUNTER — Other Ambulatory Visit: Payer: Self-pay | Admitting: Family Medicine

## 2014-08-27 ENCOUNTER — Encounter (HOSPITAL_COMMUNITY): Payer: Self-pay | Admitting: Pharmacy Technician

## 2014-08-29 ENCOUNTER — Ambulatory Visit (HOSPITAL_COMMUNITY)
Admission: RE | Admit: 2014-08-29 | Discharge: 2014-08-29 | Disposition: A | Discharge status: Home Health | Payer: Medicare Other | Source: Ambulatory Visit | Attending: Internal Medicine | Admitting: Internal Medicine

## 2014-08-29 ENCOUNTER — Encounter (HOSPITAL_COMMUNITY)
Admission: RE | Disposition: A | Discharge status: Home Health | Payer: Self-pay | Source: Ambulatory Visit | Attending: Internal Medicine

## 2014-08-29 ENCOUNTER — Encounter (HOSPITAL_COMMUNITY): Payer: Self-pay | Admitting: *Deleted

## 2014-08-29 DIAGNOSIS — K921 Melena: Secondary | ICD-10-CM | POA: Diagnosis not present

## 2014-08-29 DIAGNOSIS — Z79899 Other long term (current) drug therapy: Secondary | ICD-10-CM | POA: Diagnosis not present

## 2014-08-29 DIAGNOSIS — I10 Essential (primary) hypertension: Secondary | ICD-10-CM | POA: Insufficient documentation

## 2014-08-29 DIAGNOSIS — Z86010 Personal history of colonic polyps: Secondary | ICD-10-CM

## 2014-08-29 DIAGNOSIS — Z886 Allergy status to analgesic agent status: Secondary | ICD-10-CM | POA: Insufficient documentation

## 2014-08-29 DIAGNOSIS — M899 Disorder of bone, unspecified: Secondary | ICD-10-CM | POA: Insufficient documentation

## 2014-08-29 DIAGNOSIS — Z881 Allergy status to other antibiotic agents status: Secondary | ICD-10-CM | POA: Diagnosis not present

## 2014-08-29 DIAGNOSIS — D126 Benign neoplasm of colon, unspecified: Secondary | ICD-10-CM | POA: Diagnosis not present

## 2014-08-29 DIAGNOSIS — R195 Other fecal abnormalities: Secondary | ICD-10-CM | POA: Diagnosis present

## 2014-08-29 DIAGNOSIS — M949 Disorder of cartilage, unspecified: Secondary | ICD-10-CM

## 2014-08-29 HISTORY — PX: COLONOSCOPY: SHX5424

## 2014-08-29 SURGERY — COLONOSCOPY
Anesthesia: Moderate Sedation

## 2014-08-29 MED ORDER — STERILE WATER FOR IRRIGATION IR SOLN
Status: DC | PRN
  Administered 2014-08-29: 13:00:00

## 2014-08-29 MED ORDER — LIDOCAINE VISCOUS 2 % MT SOLN
OROMUCOSAL | Status: DC | PRN
  Administered 2014-08-29: 4 mL via OROMUCOSAL

## 2014-08-29 MED ORDER — MEPERIDINE HCL 100 MG/ML IJ SOLN
INTRAMUSCULAR | Status: AC
  Filled 2014-08-29: qty 2

## 2014-08-29 MED ORDER — MIDAZOLAM HCL 5 MG/5ML IJ SOLN
INTRAMUSCULAR | Status: DC | PRN
  Administered 2014-08-29 (×2): 1 mg via INTRAVENOUS
  Administered 2014-08-29 (×2): 2 mg via INTRAVENOUS

## 2014-08-29 MED ORDER — MIDAZOLAM HCL 5 MG/5ML IJ SOLN
INTRAMUSCULAR | Status: AC
  Filled 2014-08-29: qty 10

## 2014-08-29 MED ORDER — MEPERIDINE HCL 100 MG/ML IJ SOLN
INTRAMUSCULAR | Status: DC | PRN
  Administered 2014-08-29: 50 mg via INTRAVENOUS
  Administered 2014-08-29: 25 mg via INTRAVENOUS

## 2014-08-29 MED ORDER — ONDANSETRON HCL 4 MG/2ML IJ SOLN
INTRAMUSCULAR | Status: AC
  Filled 2014-08-29: qty 2

## 2014-08-29 MED ORDER — SODIUM CHLORIDE 0.9 % IV SOLN
INTRAVENOUS | Status: DC
  Administered 2014-08-29: 1000 mL via INTRAVENOUS

## 2014-08-29 MED ORDER — ONDANSETRON HCL 4 MG/2ML IJ SOLN
INTRAMUSCULAR | Status: DC | PRN
  Administered 2014-08-29: 4 mg via INTRAVENOUS

## 2014-08-29 MED ORDER — LIDOCAINE VISCOUS 2 % MT SOLN
OROMUCOSAL | Status: AC
  Filled 2014-08-29: qty 15

## 2014-08-29 NOTE — Interval H&P Note (Signed)
History and Physical Interval Note:  08/29/2014 12:46 PM  [name]  has presented today for surgery, with the diagnosis of hem positive stool  The various methods of treatment have been discussed with the patient and family. After consideration of risks, benefits and other options for treatment, the patient has consented to  Procedure(s) with comments: COLONOSCOPY (N/A) - 1:15 pm ESOPHAGOGASTRODUODENOSCOPY (EGD) (N/A) as a surgical intervention .  The patient's history has been reviewed, patient examined, no change in status, stable for surgery.  I have reviewed the patient's chart and labs.  Questions were answered to the patient's satisfaction.     [name]  GERD, nausea and vomiting much better since on a PPI. Colonoscopy per plan. If Colonoscopy negative, will do an EGD; otherwise we'll hold EGD; The risks, benefits, limitations, imponderables and alternatives regarding both EGD and colonoscopy have been reviewed with the patient. Questions have been answered. All parties agreeable.

## 2014-08-29 NOTE — Op Note (Signed)
[facility] [address] [place], [address]   COLONOSCOPY PROCEDURE REPORT  PATIENT: Sheena, Kane  MR#: [id number] BIRTHDATE: February 26, 1936 , 58  yrs. old GENDER: female ENDOSCOPIST: R.  Garfield Cornea, MD FACP [facility] REFERRED DZ:HGDJM Wolfgang Phoenix, M.D.  Rosemary Holms, M.D. PROCEDURE DATE:  2014-09-26 PROCEDURE:   Colonoscopy, diagnostic INDICATIONS:Hemoccult-positive stool. MEDICATIONS: Versed 6 mg IV and Demerol 75 mg IV in divided doses. Xylocaine gel orally ASA CLASS:       Class II  CONSENT: The risks, benefits, alternatives and imponderables including but not limited to bleeding, perforation as well as the possibility of a missed lesion have been reviewed.  The potential for biopsy, lesion removal, etc. have also been discussed. Questions have been answered.  All parties agreeable.  Please see the history and physical in the medical record for more information.  DESCRIPTION OF PROCEDURE:   After the risks benefits and alternatives of the procedure were thoroughly explained, informed consent was obtained.  The digital rectal exam revealed no abnormalities of the rectum.   The EC-[id number] ([id number])  endoscope was introduced through the anus and advanced to the   . No adverse events experienced.   The quality of the prep was adequate.  The instrument was then slowly withdrawn as the colon was fully examined.      COLON FINDINGS: A sessile polyp measuring 4 mm in size was found at the cecum.  Retroflexed views revealed no abnormalities. Angus Seller of the rectal and colonic mucosa appeared normal.  The above-mentioned polyp was cold snare removed completely. Withdrawal time=9 minutes 0 seconds.  The scope was withdrawn and the procedure completed. COMPLICATIONS: There were no immediate complications.  ENDOSCOPIC IMPRESSION: Sessile polyp was found at the cecum status post cold snare polypectomy  RECOMMENDATIONS:  Followup on pathology. Patient nausea  vomiting and a dark stool over the summer-months ago.  Those symptoms have resolved on Protonix 40 mg daily. No need to do an EGD today per plan.   eSigned:  R. Garfield Cornea, MD Rosalita Chessman [facility] 09/26/14 1:34 PM   cc:  CPT CODES: ICD CODES:  The ICD and CPT codes recommended by this software are interpretations from the data that the clinical staff has captured with the software.  The verification of the translation of this report to the ICD and CPT codes and modifiers is the sole responsibility of the health care institution and practicing physician where this report was generated.  [place]. will not be held responsible for the validity of the ICD and CPT codes included on this report.  AMA assumes no liability for data contained or not contained herein. CPT is a Designer, television/film set of the [employer].

## 2014-08-29 NOTE — Discharge Instructions (Signed)
Colonoscopy Discharge Instructions  Read the instructions outlined below and refer to this sheet in the next few weeks. These discharge instructions provide you with general information on caring for yourself after you leave the hospital. Your doctor may also give you specific instructions. While your treatment has been planned according to the most current medical practices available, unavoidable complications occasionally occur. If you have any problems or questions after discharge, call Dr. Gala Romney at [phone]. ACTIVITY  You may resume your regular activity, but move at a slower pace for the next 24 hours.   Take frequent rest periods for the next 24 hours.   Walking will help get rid of the air and reduce the bloated feeling in your belly (abdomen).   No driving for 24 hours (because of the medicine (anesthesia) used during the test).    Do not sign any important legal documents or operate any machinery for 24 hours (because of the anesthesia used during the test).  NUTRITION  Drink plenty of fluids.   You may resume your normal diet as instructed by your doctor.   Begin with a light meal and progress to your normal diet. Heavy or fried foods are harder to digest and may make you feel sick to your stomach (nauseated).   Avoid alcoholic beverages for 24 hours or as instructed.  MEDICATIONS  You may resume your normal medications unless your doctor tells you otherwise.  WHAT YOU CAN EXPECT TODAY  Some feelings of bloating in the abdomen.   Passage of more gas than usual.   Spotting of blood in your stool or on the toilet paper.  IF YOU HAD POLYPS REMOVED DURING THE COLONOSCOPY:  No aspirin products for 7 days or as instructed.   No alcohol for 7 days or as instructed.   Eat a soft diet for the next 24 hours.  FINDING OUT THE RESULTS OF YOUR TEST Not all test results are available during your visit. If your test results are not back during the visit, make an appointment  with your caregiver to find out the results. Do not assume everything is normal if you have not heard from your caregiver or the medical facility. It is important for you to follow up on all of your test results.  SEEK IMMEDIATE MEDICAL ATTENTION IF:  You have more than a spotting of blood in your stool.   Your belly is swollen (abdominal distention).   You are nauseated or vomiting.   You have a temperature over 101.   You have abdominal pain or discomfort that is severe or gets worse throughout the day.    Polyp information provided  Further recommendations to follow pending review   Colon Polyps Polyps are lumps of extra tissue growing inside the body. Polyps can grow in the large intestine (colon). Most colon polyps are noncancerous (benign). However, some colon polyps can become cancerous over time. Polyps that are larger than a pea may be harmful. To be safe, caregivers remove and test all polyps. CAUSES  Polyps form when mutations in the genes cause your cells to grow and divide even though no more tissue is needed. RISK FACTORS There are a number of risk factors that can increase your chances of getting colon polyps. They include: Being older than 50 years. Family history of colon polyps or colon cancer. Long-term colon diseases, such as colitis or Crohn disease. Being overweight. Smoking. Being inactive. Drinking too much alcohol. SYMPTOMS  Most small polyps do not cause symptoms. If  symptoms are present, they may include: Blood in the stool. The stool may look dark red or black. Constipation or diarrhea that lasts longer than 1 week. DIAGNOSIS People often do not know they have polyps until their caregiver finds them during a regular checkup. Your caregiver can use 4 tests to check for polyps: Digital rectal exam. The caregiver wears gloves and feels inside the rectum. This test would find polyps only in the rectum. Barium enema. The caregiver puts a liquid called  barium into your rectum before taking X-rays of your colon. Barium makes your colon look white. Polyps are dark, so they are easy to see in the X-ray pictures. Sigmoidoscopy. A thin, flexible tube (sigmoidoscope) is placed into your rectum. The sigmoidoscope has a light and tiny camera in it. The caregiver uses the sigmoidoscope to look at the last third of your colon. Colonoscopy. This test is like sigmoidoscopy, but the caregiver looks at the entire colon. This is the most common method for finding and removing polyps. TREATMENT  Any polyps will be removed during a sigmoidoscopy or colonoscopy. The polyps are then tested for cancer. PREVENTION  To help lower your risk of getting more colon polyps: Eat plenty of fruits and vegetables. Avoid eating fatty foods. Do not smoke. Avoid drinking alcohol. Exercise every day. Lose weight if recommended by your caregiver. Eat plenty of calcium and folate. Foods that are rich in calcium include milk, cheese, and broccoli. Foods that are rich in folate include chickpeas, kidney beans, and spinach. HOME CARE INSTRUCTIONS Keep all follow-up appointments as directed by your caregiver. You may need periodic exams to check for polyps. SEEK MEDICAL CARE IF: You notice bleeding during a bowel movement. Document Released: 08/12/2004 Document Revised: 02/08/2012 Document Reviewed: 01/26/2012 [facility] Patient Information 2015 [place], [place]. This information is not intended to replace advice given to you by your health care provider. Make sure you discuss any questions you have with your health care provider.

## 2014-08-29 NOTE — H&P (View-Only) (Signed)
Referring Provider: Kathyrn Drown, MD Primary Care Physician:  Sallee Lange, MD Primary GI: Dr. Gala Romney   Chief Complaint  Patient presents with  . Follow-up    doing ok    HPI:   [name] presents today secondary to nausea, dry heaves, possible melena earlier this summer but normal Hgb at that time. Notes once incidence of black stool late June but normal thereafter. GERD symptoms improved with addition of PPI in Aug 2015. Ifobt completed and positive. No further evidence of melena. No hematochezia. One spell of vomiting since last seen. Thinks it was something she ate. No constipation or diarrhea.   Last EGD in 2007 with normal esophagus, small to moderate hiatal hernia, Cameron lesions. Last colonoscopy possibly in 2007 by Dr. Watt Climes. Was told repeat in 2017.  Past Medical History  Diagnosis Date  . Hypertension   . Hernia   . Thyroid disease   . Leg swelling   . Hernia     right inguinal hernia  . Osteopenia May 2010  . Osteitis pubis July 2002    Past Surgical History  Procedure Laterality Date  . Appendectomy  03/1966  . Abdominal hysterectomy  03/1970  . Cholecystectomy  1980  . Tumor removal  03/1968    ovarian  . Colonoscopy  1999  . Colonoscopy  March 2007    Repeat 10 years  . Esophagogastroduodenoscopy  2007    Dr. Gala Romney: normal esophagus, small tom oderate sized hiatal hernia, Cameron lesions  . Colonoscopy  11/06/2003     Internal and external hemorrhoids/Two tiny rectal distal sigmoid polyps cold biopsied/Otherwise within normal limits to the cecum    Current Outpatient Prescriptions  Medication Sig Dispense Refill  . ALPRAZolam (XANAX) 0.5 MG tablet Take 1 tablet (0.5 mg total) by mouth 2 (two) times daily as needed for sleep. One bid prn  60 tablet  3  . atenolol (TENORMIN) 50 MG tablet Take 1 tablet (50 mg total) by mouth daily.  90 tablet  1  . Calcium Carbonate-Vitamin D (CALTRATE 600+D PO) Take by mouth.      . Cholecalciferol (VITAMIN D3)  2000 UNITS TABS Take by mouth.      . hydrochlorothiazide (HYDRODIURIL) 25 MG tablet Take 1 tablet (25 mg total) by mouth daily.  90 tablet  1  . levothyroxine (SYNTHROID, LEVOTHROID) 112 MCG tablet Take 1 tablet (112 mcg total) by mouth daily.  90 tablet  3  . pantoprazole (PROTONIX) 40 MG tablet Take 1 tablet (40 mg total) by mouth daily.  30 tablet  3  . potassium chloride (KLOR-CON M10) 10 MEQ tablet Take 1 tablet (10 mEq total) by mouth daily.  90 tablet  1  . venlafaxine XR (EFFEXOR-XR) 150 MG 24 hr capsule Take 1 capsule (150 mg total) by mouth daily.  90 capsule  1   No current facility-administered medications for this visit.    Allergies as of 08/13/2014 - Review Complete 08/13/2014  Allergen Reaction Noted  . Erythromycin Shortness Of Breath 04/15/2012  . Aspirin Other (See Comments) 04/15/2012  . Ibuprofen  04/15/2012    Family History  Problem Relation Age of Onset  . Dementia Mother   . Heart disease Father   . Cancer Father     throat  . Cancer Sister     breast  . Colon cancer Neg Hx     History   Social History  . Marital Status: Married    Spouse Name: N/A  Number of Children: N/A  . Years of Education: N/A   Occupational History  . Retired     Orthoptist, retired in [place]  . Smoking status: Never Smoker   . Smokeless tobacco: Never Used  . Alcohol Use: Yes     Comment: occasional margarita  . Drug Use: No  . Sexual Activity: None   Other Topics Concern  . None   Social History Narrative  . None    Review of Systems: As mentioned in HPI  Physical Exam: BP 121/70  Pulse 73  Temp(Src) 97.4 F (36.3 C) (Oral)  Ht 5' 5.5" (1.664 m)  Wt 190 lb 3.2 oz (86.274 kg)  BMI 31.16 kg/m2 General:   Alert and oriented. No distress noted. Pleasant and cooperative.  Head:  Normocephalic and atraumatic. Eyes:  Conjuctiva clear without scleral icterus. Mouth:  Oral mucosa pink and moist. Good dentition. No lesions. Heart:   S1, S2 present without murmurs, rubs, or gallops. Regular rate and rhythm. Abdomen:  +BS, soft, non-tender and non-distended. No rebound or guarding. No HSM or masses noted. Msk:  Symmetrical without gross deformities. Normal posture. Extremities:  Without edema. Neurologic:  Alert and  oriented x4;  grossly normal neurologically. Skin:  Intact without significant lesions or rashes. Psych:  Alert and cooperative. Normal mood and affect.  Lab Results  Component Value Date   WBC 7.2 06/12/2014   HGB 12.7 06/12/2014   HCT 36.9 06/12/2014   MCV 86.0 06/12/2014   PLT 335 06/12/2014

## 2014-08-31 ENCOUNTER — Encounter (HOSPITAL_COMMUNITY): Payer: Self-pay | Admitting: Internal Medicine

## 2014-09-01 ENCOUNTER — Encounter: Payer: Self-pay | Admitting: Internal Medicine

## 2014-09-03 DIAGNOSIS — Z23 Encounter for immunization: Secondary | ICD-10-CM | POA: Diagnosis not present

## 2014-09-12 DIAGNOSIS — H2513 Age-related nuclear cataract, bilateral: Secondary | ICD-10-CM | POA: Diagnosis not present

## 2014-09-13 DIAGNOSIS — H2513 Age-related nuclear cataract, bilateral: Secondary | ICD-10-CM | POA: Diagnosis not present

## 2014-09-18 DIAGNOSIS — H2511 Age-related nuclear cataract, right eye: Secondary | ICD-10-CM | POA: Diagnosis not present

## 2014-09-18 NOTE — Patient Instructions (Signed)
[name]  09/18/2014   Your procedure is scheduled on:  09/25/14  Report to [facility] at 9:30 AM.  Call this number if you have problems the morning of surgery: [phone]   Remember:   Do not eat food or drink liquids after midnight.   Take these medicines the morning of surgery with A SIP OF WATER: Xanax, Atenolol, Synthroid, Protonix, Effexor   Do not wear jewelry, make-up or nail polish.  Do not wear lotions, powders, or perfumes. You may wear deodorant.  Do not shave 48 hours prior to surgery. Men may shave face and neck.  Do not bring valuables to the hospital.  [facility] is not responsible for any belongings or valuables.               Contacts, dentures or bridgework may not be worn into surgery.  Leave suitcase in the car. After surgery it may be brought to your room.  For patients admitted to the hospital, discharge time is determined by your treatment team.               Patients discharged the day of surgery will not be allowed to drive home.  Name and phone number of your driver:   Special Instructions: N/A   Please read over the following fact sheets that you were given: Anesthesia Post-op Instructions   PATIENT INSTRUCTIONS POST-ANESTHESIA  IMMEDIATELY FOLLOWING SURGERY:  Do not drive or operate machinery for the first twenty four hours after surgery.  Do not make any important decisions for twenty four hours after surgery or while taking narcotic pain medications or sedatives.  If you develop intractable nausea and vomiting or a severe headache please notify your doctor immediately.  FOLLOW-UP:  Please make an appointment with your surgeon as instructed. You do not need to follow up with anesthesia unless specifically instructed to do so.  WOUND CARE INSTRUCTIONS (if applicable):  Keep a dry clean dressing on the anesthesia/puncture wound site if there is drainage.  Once the wound has quit draining you may leave it open to air.  Generally you should  leave the bandage intact for twenty four hours unless there is drainage.  If the epidural site drains for more than 36-48 hours please call the anesthesia department.  QUESTIONS?:  Please feel free to call your physician or the hospital operator if you have any questions, and they will be happy to assist you.      Cataract A cataract is a clouding of the lens of the eye. When a lens becomes cloudy, vision is reduced based on the degree and nature of the clouding. Many cataracts reduce vision to some degree. Some cataracts make people more near-sighted as they develop. Other cataracts increase glare. Cataracts that are ignored and become worse can sometimes look white. The white color can be seen through the pupil. CAUSES   Aging. However, cataracts may occur at any age, even in newborns.  Certain drugs.  Trauma to the eye.  Certain diseases such as diabetes.  Specific eye diseases such as chronic inflammation inside the eye or a sudden attack of a rare form of glaucoma.  Inherited or acquired medical problems. SYMPTOMS   Gradual, progressive drop in vision in the affected eye.  Severe, rapid visual loss. This most often happens when trauma is the cause. DIAGNOSIS  To detect a cataract, an eye doctor examines the lens. Cataracts are best diagnosed with an exam of the eyes with the pupils enlarged (dilated)  by drops.  TREATMENT  For an early cataract, vision may improve by using different eyeglasses or stronger lighting. If that does not help your vision, surgery is the only effective treatment. A cataract needs to be surgically removed when vision loss interferes with your everyday activities, such as driving, reading, or watching TV. A cataract may also have to be removed if it prevents examination or treatment of another eye problem. Surgery removes the cloudy lens and usually replaces it with a substitute lens (intraocular lens, IOL).  At a time when both you and your doctor agree, the  cataract will be surgically removed. If you have cataracts in both eyes, only one is usually removed at a time. This allows the operated eye to heal and be out of danger from any possible problems after surgery (such as infection or poor wound healing). In rare cases, a cataract may be doing damage to your eye. In these cases, your caregiver may advise surgical removal right away. The vast majority of people who have cataract surgery have better vision afterward. HOME CARE INSTRUCTIONS  If you are not planning surgery, you may be asked to do the following:  Use different eyeglasses.  Use stronger or brighter lighting.  Ask your eye doctor about reducing your medicine dose or changing medicines if it is thought that a medicine caused your cataract. Changing medicines does not make the cataract go away on its own.  Become familiar with your surroundings. Poor vision can lead to injury. Avoid bumping into things on the affected side. You are at a higher risk for tripping or falling.  Exercise extreme care when driving or operating machinery.  Wear sunglasses if you are sensitive to bright light or experiencing problems with glare. SEEK IMMEDIATE MEDICAL CARE IF:   You have a worsening or sudden vision loss.  You notice redness, swelling, or increasing pain in the eye.  You have a fever. Document Released: 11/16/2005 Document Revised: 02/08/2012 Document Reviewed: 07/10/2011 [facility] Patient Information 2015 [place], [place]. This information is not intended to replace advice given to you by your health care provider. Make sure you discuss any questions you have with your health care provider.

## 2014-09-19 ENCOUNTER — Encounter (HOSPITAL_COMMUNITY): Payer: Self-pay | Admitting: Pharmacy Technician

## 2014-09-19 ENCOUNTER — Encounter (HOSPITAL_COMMUNITY): Payer: Self-pay

## 2014-09-19 ENCOUNTER — Encounter (HOSPITAL_COMMUNITY)
Admission: RE | Admit: 2014-09-19 | Discharge: 2014-09-19 | Disposition: A | Discharge status: Home / Self Care | Payer: Medicare Other | Source: Ambulatory Visit | Attending: Ophthalmology | Admitting: Ophthalmology

## 2014-09-19 ENCOUNTER — Other Ambulatory Visit: Payer: Self-pay

## 2014-09-19 DIAGNOSIS — H547 Unspecified visual loss: Secondary | ICD-10-CM | POA: Diagnosis not present

## 2014-09-19 DIAGNOSIS — Z01818 Encounter for other preprocedural examination: Secondary | ICD-10-CM | POA: Diagnosis not present

## 2014-09-19 HISTORY — DX: Hypothyroidism, unspecified: E03.9

## 2014-09-19 HISTORY — DX: Personal history of other diseases of the digestive system: Z87.19

## 2014-09-19 HISTORY — DX: Depression, unspecified: F32.A

## 2014-09-19 HISTORY — DX: Major depressive disorder, single episode, unspecified: F32.9

## 2014-09-19 LAB — BASIC METABOLIC PANEL
Anion gap: 11 (ref 5–15)
BUN: 18 mg/dL (ref 6–23)
CHLORIDE: 100 meq/L (ref 96–112)
CO2: 27 meq/L (ref 19–32)
CREATININE: 0.9 mg/dL (ref 0.50–1.10)
Calcium: 9.5 mg/dL (ref 8.4–10.5)
GFR calc Af Amer: 70 mL/min — ABNORMAL LOW (ref >90)
GFR calc non Af Amer: 61 mL/min — ABNORMAL LOW (ref >90)
GLUCOSE: 90 mg/dL (ref 70–99)
Potassium: 4.2 mEq/L (ref 3.7–5.3)
Sodium: 138 mEq/L (ref 137–147)

## 2014-09-19 LAB — HEMOGLOBIN AND HEMATOCRIT, BLOOD
HCT: 35.6 % — ABNORMAL LOW (ref 36.0–46.0)
HEMOGLOBIN: 11.9 g/dL — AB (ref 12.0–15.0)

## 2014-09-19 NOTE — Pre-Procedure Instructions (Signed)
Patient given information to sign up for chart at home.

## 2014-09-24 ENCOUNTER — Encounter (HOSPITAL_COMMUNITY): Payer: Self-pay | Admitting: Pharmacy Technician

## 2014-09-24 MED ORDER — TETRACAINE HCL 0.5 % OP SOLN
OPHTHALMIC | Status: AC
  Filled 2014-09-24: qty 2

## 2014-09-24 MED ORDER — PHENYLEPHRINE HCL 2.5 % OP SOLN
OPHTHALMIC | Status: AC
  Filled 2014-09-24: qty 15

## 2014-09-24 MED ORDER — KETOROLAC TROMETHAMINE 0.5 % OP SOLN
OPHTHALMIC | Status: AC
  Filled 2014-09-24: qty 5

## 2014-09-24 MED ORDER — CYCLOPENTOLATE-PHENYLEPHRINE OP SOLN OPTIME - NO CHARGE
OPHTHALMIC | Status: AC
  Filled 2014-09-24: qty 2

## 2014-09-25 ENCOUNTER — Ambulatory Visit (HOSPITAL_COMMUNITY): Payer: Medicare Other | Admitting: Anesthesiology

## 2014-09-25 ENCOUNTER — Encounter (HOSPITAL_COMMUNITY): Payer: Self-pay

## 2014-09-25 ENCOUNTER — Encounter (HOSPITAL_COMMUNITY): Payer: Medicare Other | Admitting: Anesthesiology

## 2014-09-25 ENCOUNTER — Encounter (HOSPITAL_COMMUNITY)
Admission: RE | Disposition: A | Discharge status: Home / Self Care | Payer: Self-pay | Source: Ambulatory Visit | Attending: Ophthalmology

## 2014-09-25 ENCOUNTER — Ambulatory Visit (HOSPITAL_COMMUNITY)
Admission: RE | Admit: 2014-09-25 | Discharge: 2014-09-25 | Disposition: A | Discharge status: Home / Self Care | Payer: Medicare Other | Source: Ambulatory Visit | Attending: Ophthalmology | Admitting: Ophthalmology

## 2014-09-25 DIAGNOSIS — K219 Gastro-esophageal reflux disease without esophagitis: Secondary | ICD-10-CM | POA: Diagnosis not present

## 2014-09-25 DIAGNOSIS — H2511 Age-related nuclear cataract, right eye: Secondary | ICD-10-CM | POA: Diagnosis not present

## 2014-09-25 DIAGNOSIS — E039 Hypothyroidism, unspecified: Secondary | ICD-10-CM | POA: Insufficient documentation

## 2014-09-25 DIAGNOSIS — I10 Essential (primary) hypertension: Secondary | ICD-10-CM | POA: Insufficient documentation

## 2014-09-25 DIAGNOSIS — H259 Unspecified age-related cataract: Secondary | ICD-10-CM | POA: Diagnosis not present

## 2014-09-25 DIAGNOSIS — Z79899 Other long term (current) drug therapy: Secondary | ICD-10-CM | POA: Insufficient documentation

## 2014-09-25 DIAGNOSIS — F329 Major depressive disorder, single episode, unspecified: Secondary | ICD-10-CM | POA: Diagnosis not present

## 2014-09-25 DIAGNOSIS — H2512 Age-related nuclear cataract, left eye: Secondary | ICD-10-CM | POA: Diagnosis not present

## 2014-09-25 HISTORY — PX: CATARACT EXTRACTION W/PHACO: SHX586

## 2014-09-25 SURGERY — PHACOEMULSIFICATION, CATARACT, WITH IOL INSERTION
Anesthesia: Monitor Anesthesia Care | Site: Eye | Laterality: Right

## 2014-09-25 MED ORDER — KETOROLAC TROMETHAMINE 0.5 % OP SOLN
1.0000 [drp] | OPHTHALMIC | Status: AC
  Administered 2014-09-25 (×3): 1 [drp] via OPHTHALMIC

## 2014-09-25 MED ORDER — PHENYLEPHRINE HCL 2.5 % OP SOLN
1.0000 [drp] | OPHTHALMIC | Status: AC
  Administered 2014-09-25 (×3): 1 [drp] via OPHTHALMIC

## 2014-09-25 MED ORDER — BSS IO SOLN
INTRAOCULAR | Status: DC | PRN
  Administered 2014-09-25: 15 mL via INTRAOCULAR

## 2014-09-25 MED ORDER — FENTANYL CITRATE 0.05 MG/ML IJ SOLN
INTRAMUSCULAR | Status: AC
  Filled 2014-09-25: qty 2

## 2014-09-25 MED ORDER — CYCLOPENTOLATE-PHENYLEPHRINE 0.2-1 % OP SOLN
1.0000 [drp] | OPHTHALMIC | Status: AC
  Administered 2014-09-25 (×3): 1 [drp] via OPHTHALMIC

## 2014-09-25 MED ORDER — MIDAZOLAM HCL 2 MG/2ML IJ SOLN
INTRAMUSCULAR | Status: AC
  Filled 2014-09-25: qty 2

## 2014-09-25 MED ORDER — BSS IO SOLN
INTRAOCULAR | Status: DC | PRN
  Administered 2014-09-25: 10:00:00

## 2014-09-25 MED ORDER — PROVISC 10 MG/ML IO SOLN
INTRAOCULAR | Status: DC | PRN
  Administered 2014-09-25: 0.85 mL via INTRAOCULAR

## 2014-09-25 MED ORDER — EPINEPHRINE HCL 1 MG/ML IJ SOLN
INTRAMUSCULAR | Status: AC
  Filled 2014-09-25: qty 1

## 2014-09-25 MED ORDER — TETRACAINE 0.5 % OP SOLN OPTIME - NO CHARGE
OPHTHALMIC | Status: DC | PRN
  Administered 2014-09-25: 2 [drp] via OPHTHALMIC

## 2014-09-25 MED ORDER — MIDAZOLAM HCL 2 MG/2ML IJ SOLN
1.0000 mg | INTRAMUSCULAR | Status: DC | PRN
  Administered 2014-09-25: 2 mg via INTRAVENOUS

## 2014-09-25 MED ORDER — LACTATED RINGERS IV SOLN
INTRAVENOUS | Status: DC | PRN
  Administered 2014-09-25: 08:00:00 via INTRAVENOUS

## 2014-09-25 MED ORDER — LACTATED RINGERS IV SOLN
INTRAVENOUS | Status: DC
  Administered 2014-09-25: 09:00:00 via INTRAVENOUS

## 2014-09-25 MED ORDER — FENTANYL CITRATE 0.05 MG/ML IJ SOLN
25.0000 ug | INTRAMUSCULAR | Status: AC
  Administered 2014-09-25: 25 ug via INTRAVENOUS

## 2014-09-25 MED ORDER — TETRACAINE HCL 0.5 % OP SOLN
1.0000 [drp] | OPHTHALMIC | Status: AC
  Administered 2014-09-25 (×3): 1 [drp] via OPHTHALMIC

## 2014-09-25 SURGICAL SUPPLY — 25 items
CAPSULAR TENSION RING-AMO (OPHTHALMIC RELATED) IMPLANT
CLOTH BEACON ORANGE TIMEOUT ST (SAFETY) ×2 IMPLANT
EYE SHIELD UNIVERSAL CLEAR (GAUZE/BANDAGES/DRESSINGS) ×2 IMPLANT
GLOVE BIO SURGEON STRL SZ 6.5 (GLOVE) ×1 IMPLANT
GLOVE BIO SURGEONS STRL SZ 6.5 (GLOVE) ×1
GLOVE ECLIPSE 6.5 STRL STRAW (GLOVE) IMPLANT
GLOVE ECLIPSE 7.0 STRL STRAW (GLOVE) IMPLANT
GLOVE EXAM NITRILE LRG STRL (GLOVE) IMPLANT
GLOVE EXAM NITRILE MD LF STRL (GLOVE) ×2 IMPLANT
GLOVE SKINSENSE NS SZ6.5 (GLOVE)
GLOVE SKINSENSE STRL SZ6.5 (GLOVE) IMPLANT
HEALON 5 0.6 ML (INTRAOCULAR LENS) IMPLANT
KIT VITRECTOMY (OPHTHALMIC RELATED) IMPLANT
PAD ARMBOARD 7.5X6 YLW CONV (MISCELLANEOUS) ×2 IMPLANT
PROC W NO LENS (INTRAOCULAR LENS)
PROC W SPEC LENS (INTRAOCULAR LENS)
PROCESS W NO LENS (INTRAOCULAR LENS) IMPLANT
PROCESS W SPEC LENS (INTRAOCULAR LENS) IMPLANT
RETRACTOR IRIS SIGHTPATH (OPHTHALMIC RELATED) IMPLANT
RING MALYGIN (MISCELLANEOUS) IMPLANT
SIGHTPATH CAT PROC W REG LENS (Ophthalmic Related) ×3 IMPLANT
TAPE SURG TRANSPORE 1 IN (GAUZE/BANDAGES/DRESSINGS) IMPLANT
TAPE SURGICAL TRANSPORE 1 IN (GAUZE/BANDAGES/DRESSINGS) ×2
VISCOELASTIC ADDITIONAL (OPHTHALMIC RELATED) IMPLANT
WATER STERILE IRR 250ML POUR (IV SOLUTION) ×2 IMPLANT

## 2014-09-25 NOTE — Anesthesia Procedure Notes (Signed)
Procedure Name: MAC Date/Time: 09/25/2014 9:30 AM Performed by: Andree Elk, AMY A Pre-anesthesia Checklist: Patient identified, Timeout performed, Emergency Drugs available, Suction available and Patient being monitored Oxygen Delivery Method: Nasal cannula

## 2014-09-25 NOTE — Op Note (Signed)
Patient brought to the operating room and prepped and draped in the usual manner.  Lid speculum inserted in right eye.  Stab incision made at the twelve o'clock position.  Provisc instilled in the anterior chamber.   A 2.4 mm. Stab incision was made temporally.  An anterior capsulotomy was done with a bent 25 gauge needle.  The nucleus was hydrodissected.  The Phaco tip was inserted in the anterior chamber and the nucleus was emulsified.  CDE was 7.56.  The cortical material was then removed with the I and A tip.  Posterior capsule was the polished.  The anterior chamber was deepened with Provisc.  A 23.0 Alcon SN60WF IOL was then inserted in the capsular bag.  Provisc was then removed with the I and A tip.  The wound was then hydrated.  Patient sent to the Recovery Room in good condition with follow up in my office.  Preoperative Diagnosis:  Nuclear Cataract OD Postoperative Diagnosis:  Same Procedure name: Kelman Phacoemulsification OD with IOL

## 2014-09-25 NOTE — H&P (Signed)
The patient was re examined and there is no change in the patients condition since the original H and P. 

## 2014-09-25 NOTE — Discharge Instructions (Signed)
[name]  09/25/2014           [facility] Instructions [place] [address]      1. Avoid closing eyes tightly. One often closes the eye tightly when laughing, talking, sneezing, coughing or if they feel irritated. At these times, you should be careful not to close your eyes tightly.  2. Instill eye drops as instructed. To instill drops in your eye, open it, look up and have someone gently pull the lower lid down and instill a couple of drops inside the lower lid.  3. Do not touch upper lid.  4. Take Advil or Tylenol for pain.  5. You may use either eye for near work, such as reading or sewing and you may watch television.  6. You may have your hair done at the beauty parlor at any time.  7. Wear dark glasses with or without your own glasses if you are in bright light.  8. Call our office at [phone] or [phone] if you have sharp pain in your eye or unusual symptoms.  9. Do not be concerned because vision in the operative eye is not good. It will not be good, no matter how successful the operation, until you get a special lens for it. Your old glasses will not be suited to the new eye that was operated on and you will not be ready for a new lens for about a month.  10. Follow up at the [facility] office.    I have received a copy of the above instructions and will follow them.     Follow up today with Dr. Gershon Crane between 2-3 pm

## 2014-09-25 NOTE — Transfer of Care (Signed)
Immediate Anesthesia Transfer of Care Note  Patient: [name]  Procedure(s) Performed: Procedure(s) with comments: CATARACT EXTRACTION PHACO AND INTRAOCULAR LENS PLACEMENT (IOC) (Right) - CDE:7.56  Patient Location: Short Stay  Anesthesia Type:MAC  Level of Consciousness: awake, alert , oriented and patient cooperative  Airway & Oxygen Therapy: Patient Spontanous Breathing  Post-op Assessment: Report given to PACU RN and Post -op Vital signs reviewed and stable  Post vital signs: Reviewed and stable  Complications: No apparent anesthesia complications

## 2014-09-25 NOTE — Anesthesia Postprocedure Evaluation (Signed)
  Anesthesia Post-op Note  Patient: [name]  Procedure(s) Performed: Procedure(s) with comments: CATARACT EXTRACTION PHACO AND INTRAOCULAR LENS PLACEMENT (IOC) (Right) - CDE:7.56  Patient Location: Short Stay  Anesthesia Type:MAC  Level of Consciousness: awake, alert , oriented and patient cooperative  Airway and Oxygen Therapy: Patient Spontanous Breathing  Post-op Pain: none  Post-op Assessment: Post-op Vital signs reviewed, Patient's Cardiovascular Status Stable, Respiratory Function Stable, Patent Airway, No signs of Nausea or vomiting and Pain level controlled  Post-op Vital Signs: Reviewed and stable  Last Vitals:  Filed Vitals:   09/25/14 0925  BP: 134/60  Pulse:   Temp:   Resp: 22    Complications: No apparent anesthesia complications

## 2014-09-25 NOTE — Anesthesia Preprocedure Evaluation (Signed)
Anesthesia Evaluation  Patient identified by MRN, date of birth, ID band Patient awake    Reviewed: Allergy & Precautions, H&P , NPO status , Patient's Chart, lab work & pertinent test results  Airway Mallampati: II  TM Distance: >3 FB Neck ROM: Full    Dental  (+) Teeth Intact, Implants   Pulmonary neg pulmonary ROS,  breath sounds clear to auscultation        Cardiovascular hypertension, Pt. on medications Rhythm:Regular Rate:Normal     Neuro/Psych PSYCHIATRIC DISORDERS Depression    GI/Hepatic hiatal hernia, GERD-  ,  Endo/Other  Hypothyroidism   Renal/GU      Musculoskeletal   Abdominal   Peds  Hematology   Anesthesia Other Findings   Reproductive/Obstetrics                             Anesthesia Physical Anesthesia Plan  ASA: II  Anesthesia Plan: MAC   Post-op Pain Management:    Induction: Intravenous  Airway Management Planned: Nasal Cannula  Additional Equipment:   Intra-op Plan:   Post-operative Plan:   Informed Consent: I have reviewed the patients History and Physical, chart, labs and discussed the procedure including the risks, benefits and alternatives for the proposed anesthesia with the patient or authorized representative who has indicated his/her understanding and acceptance.     Plan Discussed with:   Anesthesia Plan Comments:         Anesthesia Quick Evaluation

## 2014-09-26 ENCOUNTER — Encounter (HOSPITAL_COMMUNITY): Payer: Self-pay | Admitting: Ophthalmology

## 2014-10-01 MED ORDER — FENTANYL CITRATE 0.05 MG/ML IJ SOLN: 25.0000 ug | INTRAMUSCULAR | Status: DC | PRN

## 2014-10-01 MED ORDER — ONDANSETRON HCL 4 MG/2ML IJ SOLN: 4.0000 mg | Freq: Once | INTRAMUSCULAR | Status: AC | PRN

## 2014-10-02 ENCOUNTER — Encounter (HOSPITAL_COMMUNITY): Payer: Medicare Other | Attending: Ophthalmology

## 2014-10-03 ENCOUNTER — Encounter (HOSPITAL_COMMUNITY): Payer: Self-pay | Admitting: *Deleted

## 2014-10-08 MED ORDER — TETRACAINE HCL 0.5 % OP SOLN
OPHTHALMIC | Status: AC
  Filled 2014-10-08: qty 2

## 2014-10-08 MED ORDER — PHENYLEPHRINE HCL 2.5 % OP SOLN
OPHTHALMIC | Status: AC
  Filled 2014-10-08: qty 15

## 2014-10-08 MED ORDER — CYCLOPENTOLATE-PHENYLEPHRINE OP SOLN OPTIME - NO CHARGE
OPHTHALMIC | Status: AC
  Filled 2014-10-08: qty 2

## 2014-10-08 MED ORDER — KETOROLAC TROMETHAMINE 0.5 % OP SOLN
OPHTHALMIC | Status: AC
  Filled 2014-10-08: qty 5

## 2014-10-09 ENCOUNTER — Encounter (HOSPITAL_COMMUNITY): Payer: Self-pay | Admitting: Anesthesiology

## 2014-10-09 ENCOUNTER — Ambulatory Visit (HOSPITAL_COMMUNITY): Payer: Medicare Other | Admitting: Anesthesiology

## 2014-10-09 ENCOUNTER — Ambulatory Visit (HOSPITAL_COMMUNITY)
Admission: RE | Admit: 2014-10-09 | Discharge: 2014-10-09 | Disposition: A | Discharge status: Home / Self Care | Payer: Medicare Other | Source: Ambulatory Visit | Attending: Ophthalmology | Admitting: Ophthalmology

## 2014-10-09 ENCOUNTER — Encounter (HOSPITAL_COMMUNITY)
Admission: RE | Disposition: A | Discharge status: Home / Self Care | Payer: Self-pay | Source: Ambulatory Visit | Attending: Ophthalmology

## 2014-10-09 DIAGNOSIS — H259 Unspecified age-related cataract: Secondary | ICD-10-CM | POA: Diagnosis not present

## 2014-10-09 DIAGNOSIS — H2512 Age-related nuclear cataract, left eye: Secondary | ICD-10-CM | POA: Diagnosis not present

## 2014-10-09 DIAGNOSIS — E785 Hyperlipidemia, unspecified: Secondary | ICD-10-CM | POA: Diagnosis not present

## 2014-10-09 DIAGNOSIS — K219 Gastro-esophageal reflux disease without esophagitis: Secondary | ICD-10-CM | POA: Diagnosis not present

## 2014-10-09 DIAGNOSIS — E039 Hypothyroidism, unspecified: Secondary | ICD-10-CM | POA: Diagnosis not present

## 2014-10-09 DIAGNOSIS — I10 Essential (primary) hypertension: Secondary | ICD-10-CM | POA: Diagnosis not present

## 2014-10-09 HISTORY — PX: CATARACT EXTRACTION W/PHACO: SHX586

## 2014-10-09 SURGERY — PHACOEMULSIFICATION, CATARACT, WITH IOL INSERTION
Anesthesia: Monitor Anesthesia Care | Laterality: Left

## 2014-10-09 MED ORDER — PHENYLEPHRINE HCL 2.5 % OP SOLN
1.0000 [drp] | OPHTHALMIC | Status: AC
Start: 2014-10-09 — End: 2014-10-09
  Administered 2014-10-09 (×3): 1 [drp] via OPHTHALMIC

## 2014-10-09 MED ORDER — PROVISC 10 MG/ML IO SOLN
INTRAOCULAR | Status: DC | PRN
  Administered 2014-10-09: 0.85 mL via INTRAOCULAR

## 2014-10-09 MED ORDER — TETRACAINE 0.5 % OP SOLN OPTIME - NO CHARGE
OPHTHALMIC | Status: DC | PRN
  Administered 2014-10-09: 2 [drp] via OPHTHALMIC

## 2014-10-09 MED ORDER — TETRACAINE HCL 0.5 % OP SOLN
1.0000 [drp] | OPHTHALMIC | Status: AC
  Administered 2014-10-09 (×3): 1 [drp] via OPHTHALMIC

## 2014-10-09 MED ORDER — FENTANYL CITRATE 0.05 MG/ML IJ SOLN
INTRAMUSCULAR | Status: AC
  Filled 2014-10-09: qty 2

## 2014-10-09 MED ORDER — MIDAZOLAM HCL 2 MG/2ML IJ SOLN
1.0000 mg | INTRAMUSCULAR | Status: DC | PRN
  Administered 2014-10-09: 2 mg via INTRAVENOUS

## 2014-10-09 MED ORDER — LACTATED RINGERS IV SOLN
INTRAVENOUS | Status: DC
  Administered 2014-10-09: 1000 mL via INTRAVENOUS

## 2014-10-09 MED ORDER — CYCLOPENTOLATE-PHENYLEPHRINE 0.2-1 % OP SOLN
1.0000 [drp] | OPHTHALMIC | Status: AC
Start: 2014-10-09 — End: 2014-10-09
  Administered 2014-10-09 (×3): 1 [drp] via OPHTHALMIC

## 2014-10-09 MED ORDER — BSS IO SOLN
INTRAOCULAR | Status: DC | PRN
  Administered 2014-10-09: 15 mL

## 2014-10-09 MED ORDER — KETOROLAC TROMETHAMINE 0.5 % OP SOLN
1.0000 [drp] | OPHTHALMIC | Status: AC
  Administered 2014-10-09 (×3): 1 [drp] via OPHTHALMIC

## 2014-10-09 MED ORDER — EPINEPHRINE HCL 1 MG/ML IJ SOLN
INTRAOCULAR | Status: DC | PRN
  Administered 2014-10-09: 08:00:00

## 2014-10-09 MED ORDER — EPINEPHRINE HCL 1 MG/ML IJ SOLN
INTRAMUSCULAR | Status: AC
  Filled 2014-10-09: qty 1

## 2014-10-09 MED ORDER — MIDAZOLAM HCL 2 MG/2ML IJ SOLN
INTRAMUSCULAR | Status: AC
  Filled 2014-10-09: qty 2

## 2014-10-09 MED ORDER — FENTANYL CITRATE 0.05 MG/ML IJ SOLN
25.0000 ug | INTRAMUSCULAR | Status: AC
  Administered 2014-10-09 (×2): 25 ug via INTRAVENOUS

## 2014-10-09 SURGICAL SUPPLY — 10 items
CLOTH BEACON ORANGE TIMEOUT ST (SAFETY) ×2 IMPLANT
EYE SHIELD UNIVERSAL CLEAR (GAUZE/BANDAGES/DRESSINGS) ×2 IMPLANT
GLOVE BIO SURGEON STRL SZ 6.5 (GLOVE) ×1 IMPLANT
GLOVE BIO SURGEONS STRL SZ 6.5 (GLOVE) ×1
GLOVE EXAM NITRILE MD LF STRL (GLOVE) ×2 IMPLANT
PAD ARMBOARD 7.5X6 YLW CONV (MISCELLANEOUS) ×2 IMPLANT
SIGHTPATH CAT PROC W REG LENS (Ophthalmic Related) ×3 IMPLANT
TAPE SURG TRANSPORE 1 IN (GAUZE/BANDAGES/DRESSINGS) IMPLANT
TAPE SURGICAL TRANSPORE 1 IN (GAUZE/BANDAGES/DRESSINGS) ×2
WATER STERILE IRR 250ML POUR (IV SOLUTION) ×2 IMPLANT

## 2014-10-09 NOTE — Op Note (Signed)
Patient brought to the operating room and prepped and draped in the usual manner.  Lid speculum inserted in left eye.  Stab incision made at the twelve o'clock position.  Provisc instilled in the anterior chamber.   A 2.4 mm. Stab incision was made temporally.  An anterior capsulotomy was done with a bent 25 gauge needle.  The nucleus was hydrodissected.  The Phaco tip was inserted in the anterior chamber and the nucleus was emulsified.  CDE was 6.51.  The cortical material was then removed with the I and A tip.  Posterior capsule was the polished.  The anterior chamber was deepened with Provisc.  A 23.0 Alcon SN60WF IOL was then inserted in the capsular bag.  Provisc was then removed with the I and A tip.  The wound was then hydrated.  Patient sent to the Recovery Room in good condition with follow up in my office.  Preoperative Diagnosis:  Nuclear Cataract OS Postoperative Diagnosis:  Same Procedure name: Kelman Phacoemulsification OS with IOL

## 2014-10-09 NOTE — Transfer of Care (Signed)
Immediate Anesthesia Transfer of Care Note  Patient: [name]  Procedure(s) Performed: Procedure(s): CATARACT EXTRACTION PHACO AND INTRAOCULAR LENS PLACEMENT LEFT EYE CDE=6.51 (Left)  Patient Location: Short Stay  Anesthesia Type:MAC  Level of Consciousness: awake  Airway & Oxygen Therapy: Patient Spontanous Breathing  Post-op Assessment: Report given to PACU RN  Post vital signs: Reviewed  Complications: No apparent anesthesia complications

## 2014-10-09 NOTE — Anesthesia Postprocedure Evaluation (Signed)
  Anesthesia Post-op Note  Patient: [name]  Procedure(s) Performed: Procedure(s): CATARACT EXTRACTION PHACO AND INTRAOCULAR LENS PLACEMENT LEFT EYE CDE=6.51 (Left)  Patient Location: Short Stay  Anesthesia Type:MAC  Level of Consciousness: awake, alert  and oriented  Airway and Oxygen Therapy: Patient Spontanous Breathing  Post-op Pain: none  Post-op Assessment: Post-op Vital signs reviewed, Patient's Cardiovascular Status Stable, Respiratory Function Stable, Patent Airway and No signs of Nausea or vomiting  Post-op Vital Signs: Reviewed and stable  Last Vitals:  Filed Vitals:   10/09/14 0740  BP: 128/53  Pulse:   Temp:   Resp: 19    Complications: No apparent anesthesia complications

## 2014-10-09 NOTE — Anesthesia Preprocedure Evaluation (Signed)
Anesthesia Evaluation  Patient identified by MRN, date of birth, ID band Patient awake    Reviewed: Allergy & Precautions, H&P , NPO status , Patient's Chart, lab work & pertinent test results  Airway Mallampati: II  TM Distance: >3 FB Neck ROM: Full    Dental  (+) Teeth Intact, Implants   Pulmonary neg pulmonary ROS,  breath sounds clear to auscultation        Cardiovascular hypertension, Pt. on medications Rhythm:Regular Rate:Normal     Neuro/Psych PSYCHIATRIC DISORDERS Depression    GI/Hepatic hiatal hernia, GERD-  ,  Endo/Other  Hypothyroidism   Renal/GU      Musculoskeletal   Abdominal   Peds  Hematology   Anesthesia Other Findings   Reproductive/Obstetrics                             Anesthesia Physical Anesthesia Plan  ASA: II  Anesthesia Plan: MAC   Post-op Pain Management:    Induction: Intravenous  Airway Management Planned: Nasal Cannula  Additional Equipment:   Intra-op Plan:   Post-operative Plan:   Informed Consent: I have reviewed the patients History and Physical, chart, labs and discussed the procedure including the risks, benefits and alternatives for the proposed anesthesia with the patient or authorized representative who has indicated his/her understanding and acceptance.     Plan Discussed with:   Anesthesia Plan Comments:         Anesthesia Quick Evaluation

## 2014-10-09 NOTE — H&P (Signed)
The patient was re examined and there is no change in the patients condition since the original H and P. 

## 2014-10-09 NOTE — Discharge Instructions (Addendum)
[name]  10/09/2014           [facility] Instructions [place] [address]-      1. Avoid closing eyes tightly. One often closes the eye tightly when laughing, talking, sneezing, coughing or if they feel irritated. At these times, you should be careful not to close your eyes tightly.  2. Instill eye drops as instructed. To instill drops in your eye, open it, look up and have someone gently pull the lower lid down and instill a couple of drops inside the lower lid.  3. Do not touch upper lid.  4. Take Advil or Tylenol for pain.  5. You may use either eye for near work, such as reading or sewing and you may watch television.  6. You may have your hair done at the beauty parlor at any time.  7. Wear dark glasses with or without your own glasses if you are in bright light.  8. Call our office at [phone] or [phone] if you have sharp pain in your eye or unusual symptoms.  9. Do not be concerned because vision in the operative eye is not good. It will not be good, no matter how successful the operation, until you get a special lens for it. Your old glasses will not be suited to the new eye that was operated on and you will not be ready for a new lens for about a month.  10. Follow up at the [facility] office.    I have received a copy of the above instructions and will follow them.      PATIENT INSTRUCTIONS POST-ANESTHESIA  IMMEDIATELY FOLLOWING SURGERY:  Do not drive or operate machinery for the first twenty four hours after surgery.  Do not make any important decisions for twenty four hours after surgery or while taking narcotic pain medications or sedatives.  If you develop intractable nausea and vomiting or a severe headache please notify your doctor immediately.  FOLLOW-UP:  Please make an appointment with your surgeon as instructed. You do not need to follow up with anesthesia unless specifically instructed to do  so.  WOUND CARE INSTRUCTIONS (if applicable):  Keep a dry clean dressing on the anesthesia/puncture wound site if there is drainage.  Once the wound has quit draining you may leave it open to air.  Generally you should leave the bandage intact for twenty four hours unless there is drainage.  If the epidural site drains for more than 36-48 hours please call the anesthesia department.  QUESTIONS?:  Please feel free to call your physician or the hospital operator if you have any questions, and they will be happy to assist you.

## 2014-10-10 ENCOUNTER — Encounter (HOSPITAL_COMMUNITY): Payer: Self-pay | Admitting: Ophthalmology

## 2014-10-13 ENCOUNTER — Other Ambulatory Visit: Payer: Self-pay | Admitting: Family Medicine

## 2014-10-15 ENCOUNTER — Other Ambulatory Visit: Payer: Self-pay | Admitting: Family Medicine

## 2014-10-29 DIAGNOSIS — D485 Neoplasm of uncertain behavior of skin: Secondary | ICD-10-CM | POA: Diagnosis not present

## 2014-10-29 DIAGNOSIS — L570 Actinic keratosis: Secondary | ICD-10-CM | POA: Diagnosis not present

## 2014-10-29 DIAGNOSIS — Z85828 Personal history of other malignant neoplasm of skin: Secondary | ICD-10-CM | POA: Diagnosis not present

## 2014-12-04 ENCOUNTER — Other Ambulatory Visit: Payer: Self-pay | Admitting: Family Medicine

## 2014-12-04 DIAGNOSIS — Z1231 Encounter for screening mammogram for malignant neoplasm of breast: Secondary | ICD-10-CM

## 2014-12-09 ENCOUNTER — Other Ambulatory Visit: Payer: Self-pay | Admitting: Family Medicine

## 2014-12-10 NOTE — Telephone Encounter (Signed)
Last seen 06/19/14

## 2014-12-11 NOTE — Telephone Encounter (Signed)
This and 1 refill, needs ov before further

## 2014-12-19 ENCOUNTER — Ambulatory Visit (HOSPITAL_COMMUNITY)
Admission: RE | Admit: 2014-12-19 | Discharge: 2014-12-19 | Disposition: A | Discharge status: Home / Self Care | Payer: Medicare Other | Source: Ambulatory Visit | Attending: Family Medicine | Admitting: Family Medicine

## 2014-12-19 DIAGNOSIS — Z1231 Encounter for screening mammogram for malignant neoplasm of breast: Secondary | ICD-10-CM | POA: Diagnosis not present

## 2014-12-21 ENCOUNTER — Ambulatory Visit: Payer: Medicare Other | Admitting: Family Medicine

## 2014-12-27 ENCOUNTER — Ambulatory Visit (INDEPENDENT_AMBULATORY_CARE_PROVIDER_SITE_OTHER): Payer: Medicare Other | Admitting: Family Medicine

## 2014-12-27 VITALS — BP 112/80 | Ht 65.5 in | Wt 191.0 lb

## 2014-12-27 DIAGNOSIS — G44099 Other trigeminal autonomic cephalgias (TAC), not intractable: Secondary | ICD-10-CM | POA: Diagnosis not present

## 2014-12-27 DIAGNOSIS — E038 Other specified hypothyroidism: Secondary | ICD-10-CM

## 2014-12-27 MED ORDER — ALPRAZOLAM 0.5 MG PO TABS: 0.5000 mg | ORAL_TABLET | Freq: Two times a day (BID) | ORAL | Status: DC | PRN

## 2014-12-27 NOTE — Progress Notes (Signed)
   Subjective:    Patient ID: [name], female    DOB: 1936-09-06, 79 y.o.   MRN: [id number]  Hypertension This is a chronic problem. The current episode started more than 1 year ago. Associated symptoms include headaches. Pertinent negatives include no chest pain or shortness of breath. There are no compliance problems.   pt does walk some but states she could do better. Uses hand weights and does stretching. Follows a healthy diet.   Concerns about having a cramping pain in head and then afterwards her head feels sore. This started about 2 months ago. Sometimes back of head Sore to the touch , comes and goes  Review of Systems  Constitutional: Negative for fever and activity change.  HENT: Positive for rhinorrhea. Negative for congestion and ear pain.   Eyes: Negative for discharge.  Respiratory: Negative for cough, shortness of breath and wheezing.   Cardiovascular: Negative for chest pain.  Neurological: Positive for headaches. Negative for dizziness, tremors, syncope, speech difficulty, light-headedness and numbness.       Objective:   Physical Exam  Constitutional: She appears well-nourished. No distress.  Cardiovascular: Normal rate, regular rhythm and normal heart sounds.   No murmur heard. Pulmonary/Chest: Effort normal and breath sounds normal. No respiratory distress.  Musculoskeletal: She exhibits no edema.  Lymphadenopathy:    She has no cervical adenopathy.  Neurological: She is alert. She exhibits normal muscle tone.  Psychiatric: Her behavior is normal.  Vitals reviewed.         Assessment & Plan:  1. Other specified hypothyroidism Continue current medication check lab work - TSH  2. Other trigeminal autonomic cephalgia (TAC), not intractable Intermittent headaches is hard to tell if this is a pinch nerve in the back of her head I doubt that it is temporal arteritis but we will go ahead and check a sedimentation rate if ongoing troubles with headache  may need other testing - Sedimentation rate

## 2014-12-28 LAB — TSH: TSH: 2.235 u[IU]/mL (ref 0.350–4.500)

## 2014-12-28 LAB — SEDIMENTATION RATE: SED RATE: 6 mm/h (ref 0–22)

## 2015-01-01 NOTE — Progress Notes (Signed)
Patient notified and verbalized understanding of the test results. No further questions. 

## 2015-02-12 ENCOUNTER — Other Ambulatory Visit: Payer: Self-pay | Admitting: Family Medicine

## 2015-02-12 DIAGNOSIS — Z961 Presence of intraocular lens: Secondary | ICD-10-CM | POA: Diagnosis not present

## 2015-02-21 ENCOUNTER — Encounter: Payer: Self-pay | Admitting: Internal Medicine

## 2015-03-21 ENCOUNTER — Other Ambulatory Visit: Payer: Self-pay | Admitting: Family Medicine

## 2015-03-23 ENCOUNTER — Other Ambulatory Visit: Payer: Self-pay | Admitting: Family Medicine

## 2015-03-25 NOTE — Telephone Encounter (Signed)
Ok this and 4 rf

## 2015-04-10 ENCOUNTER — Ambulatory Visit (INDEPENDENT_AMBULATORY_CARE_PROVIDER_SITE_OTHER): Payer: Medicare Other | Admitting: Family Medicine

## 2015-04-10 VITALS — BP 118/80 | Ht 65.5 in | Wt 194.0 lb

## 2015-04-10 DIAGNOSIS — R0600 Dyspnea, unspecified: Secondary | ICD-10-CM

## 2015-04-10 DIAGNOSIS — R7989 Other specified abnormal findings of blood chemistry: Secondary | ICD-10-CM

## 2015-04-10 DIAGNOSIS — R3589 Other polyuria: Secondary | ICD-10-CM

## 2015-04-10 DIAGNOSIS — R600 Localized edema: Secondary | ICD-10-CM | POA: Diagnosis not present

## 2015-04-10 DIAGNOSIS — R358 Other polyuria: Secondary | ICD-10-CM

## 2015-04-10 DIAGNOSIS — Z79899 Other long term (current) drug therapy: Secondary | ICD-10-CM | POA: Diagnosis not present

## 2015-04-10 DIAGNOSIS — R799 Abnormal finding of blood chemistry, unspecified: Secondary | ICD-10-CM | POA: Diagnosis not present

## 2015-04-10 LAB — POCT URINALYSIS DIPSTICK
PH UA: 7
Spec Grav, UA: 1.015

## 2015-04-10 MED ORDER — INDAPAMIDE 2.5 MG PO TABS
2.5000 mg | ORAL_TABLET | Freq: Every day | ORAL | Status: DC
Start: 2015-04-10 — End: 2015-06-24

## 2015-04-10 NOTE — Progress Notes (Signed)
   Subjective:    Patient ID: [name], female    DOB: June 17, 1936, 79 y.o.   MRN: [id number]  HPISwelling in feet and ankles for the past 2 months. Taking HCTZ 25mg  one daily.  She denies nausea vomiting diarrhea. Denies any chest pressure tightness pain. This been on coming over the past 3-4 months.   Review of Systems  Constitutional: Negative for activity change, appetite change and fatigue.  HENT: Negative for congestion.   Respiratory: Negative for cough.   Cardiovascular: Negative for chest pain.  Gastrointestinal: Negative for abdominal pain.  Endocrine: Negative for polydipsia and polyphagia.  Neurological: Negative for weakness.  Psychiatric/Behavioral: Negative for confusion.   She denies PND denies orthopnea. Does relate a little dyspnea.    Objective:   Physical Exam  Constitutional: She appears well-nourished. No distress.  Cardiovascular: Normal rate, regular rhythm and normal heart sounds.   No murmur heard. Pulmonary/Chest: Effort normal and breath sounds normal. No respiratory distress.  Musculoskeletal: She exhibits edema.  Lymphadenopathy:    She has no cervical adenopathy.  Neurological: She is alert. She exhibits normal muscle tone.  Psychiatric: Her behavior is normal.  Vitals reviewed.  No crackles in the lungs. There is some mild edema in the ankles all the way up into the lower part of the leg. Pitting edema approximately 1+.      Assessment & Plan:  We will bring her back in several weeks to recheck Change diuretics Lab work checked to make sure there is no sign of CHF or albumin dysfunction or creatinine dysfunction. Urine protein negative.

## 2015-04-11 LAB — HEPATIC FUNCTION PANEL
ALBUMIN: 4.3 g/dL (ref 3.5–4.8)
ALT: 13 IU/L (ref 0–32)
AST: 18 IU/L (ref 0–40)
Alkaline Phosphatase: 69 IU/L (ref 39–117)
BILIRUBIN, DIRECT: 0.15 mg/dL (ref 0.00–0.40)
Bilirubin Total: 0.6 mg/dL (ref 0.0–1.2)
Total Protein: 6.8 g/dL (ref 6.0–8.5)

## 2015-04-11 LAB — BASIC METABOLIC PANEL
BUN / CREAT RATIO: 18 (ref 11–26)
BUN: 14 mg/dL (ref 8–27)
CO2: 27 mmol/L (ref 18–29)
Calcium: 9.2 mg/dL (ref 8.7–10.3)
Chloride: 95 mmol/L — ABNORMAL LOW (ref 97–108)
Creatinine, Ser: 0.77 mg/dL (ref 0.57–1.00)
GFR calc non Af Amer: 75 mL/min/{1.73_m2} (ref >59)
GFR, EST AFRICAN AMERICAN: 86 mL/min/{1.73_m2} (ref >59)
Glucose: 92 mg/dL (ref 65–99)
POTASSIUM: 4.1 mmol/L (ref 3.5–5.2)
Sodium: 138 mmol/L (ref 134–144)

## 2015-04-11 LAB — BRAIN NATRIURETIC PEPTIDE: BNP: 163.5 pg/mL — AB (ref 0.0–100.0)

## 2015-04-15 NOTE — Addendum Note (Signed)
Addended by: Carmelina Noun on: 04/15/2015 08:29 AM   Modules accepted: Orders

## 2015-04-22 ENCOUNTER — Other Ambulatory Visit: Payer: Self-pay | Admitting: Family Medicine

## 2015-04-22 DIAGNOSIS — L570 Actinic keratosis: Secondary | ICD-10-CM | POA: Diagnosis not present

## 2015-04-22 DIAGNOSIS — C4442 Squamous cell carcinoma of skin of scalp and neck: Secondary | ICD-10-CM | POA: Diagnosis not present

## 2015-04-22 DIAGNOSIS — L859 Epidermal thickening, unspecified: Secondary | ICD-10-CM | POA: Diagnosis not present

## 2015-04-22 DIAGNOSIS — D485 Neoplasm of uncertain behavior of skin: Secondary | ICD-10-CM | POA: Diagnosis not present

## 2015-04-22 DIAGNOSIS — Z85828 Personal history of other malignant neoplasm of skin: Secondary | ICD-10-CM | POA: Diagnosis not present

## 2015-04-22 DIAGNOSIS — C44629 Squamous cell carcinoma of skin of left upper limb, including shoulder: Secondary | ICD-10-CM | POA: Diagnosis not present

## 2015-04-24 ENCOUNTER — Ambulatory Visit (INDEPENDENT_AMBULATORY_CARE_PROVIDER_SITE_OTHER): Payer: Medicare Other | Admitting: Family Medicine

## 2015-04-24 ENCOUNTER — Encounter: Payer: Self-pay | Admitting: Family Medicine

## 2015-04-24 VITALS — BP 122/80 | Ht 65.5 in | Wt 188.8 lb

## 2015-04-24 DIAGNOSIS — D509 Iron deficiency anemia, unspecified: Secondary | ICD-10-CM

## 2015-04-24 DIAGNOSIS — R600 Localized edema: Secondary | ICD-10-CM

## 2015-04-24 NOTE — Progress Notes (Signed)
   Subjective:    Patient ID: Gayleen Orem, female    DOB: 1936-06-23, 79 y.o.   MRN: [id number]  HPI Patient arrives for a follow up on swelling in feet. Patient states she is doing better on Lozol. Patient denies chest tightness pressure pain or shortness of breath but she does state that she gives out of energy after 4-5 hours of activity every day denies sleep apnea symptoms  Review of Systems  Constitutional: Negative for activity change, appetite change and fatigue.  HENT: Negative for congestion.   Respiratory: Negative for cough.   Cardiovascular: Negative for chest pain.  Gastrointestinal: Negative for abdominal pain.  Endocrine: Negative for polydipsia and polyphagia.  Neurological: Negative for weakness.  Psychiatric/Behavioral: Negative for confusion.   Should also be noted that the patient did have a black stool and has had a history of anemia I recommend some additional checks. May need further workup.    Objective:   Physical Exam  Constitutional: She appears well-nourished. No distress.  Cardiovascular: Normal rate, regular rhythm and normal heart sounds.   No murmur heard. Pulmonary/Chest: Effort normal and breath sounds normal. No respiratory distress.  Musculoskeletal: She exhibits no edema.  Lymphadenopathy:    She has no cervical adenopathy.  Neurological: She is alert. She exhibits normal muscle tone.  Psychiatric: Her behavior is normal.  Vitals reviewed.         Assessment & Plan:  Pedal edema actually much better. I went over her lab work with her I do believe this patient should follow through was seen cardiology I believe she ought to get an echo to help rule out left ventricular dysfunction or possible CHF. Overall checkup today good follow-up in 3-5 months

## 2015-04-25 LAB — CBC WITH DIFFERENTIAL/PLATELET
Basophils Absolute: 0 10*3/uL (ref 0.0–0.2)
Basos: 0 %
EOS (ABSOLUTE): 0.2 10*3/uL (ref 0.0–0.4)
Eos: 2 %
HEMOGLOBIN: 13 g/dL (ref 11.1–15.9)
Hematocrit: 38.2 % (ref 34.0–46.6)
Immature Grans (Abs): 0 10*3/uL (ref 0.0–0.1)
Immature Granulocytes: 0 %
Lymphocytes Absolute: 4.5 10*3/uL — ABNORMAL HIGH (ref 0.7–3.1)
Lymphs: 50 %
MCH: 29.7 pg (ref 26.6–33.0)
MCHC: 34 g/dL (ref 31.5–35.7)
MCV: 87 fL (ref 79–97)
MONOCYTES: 8 %
Monocytes Absolute: 0.8 10*3/uL (ref 0.1–0.9)
Neutrophils Absolute: 3.7 10*3/uL (ref 1.4–7.0)
Neutrophils: 40 %
PLATELETS: 380 10*3/uL — AB (ref 150–379)
RBC: 4.37 x10E6/uL (ref 3.77–5.28)
RDW: 13.8 % (ref 12.3–15.4)
WBC: 9.3 10*3/uL (ref 3.4–10.8)

## 2015-04-25 LAB — FERRITIN: FERRITIN: 45 ng/mL (ref 15–150)

## 2015-04-26 ENCOUNTER — Encounter (INDEPENDENT_AMBULATORY_CARE_PROVIDER_SITE_OTHER): Payer: Self-pay

## 2015-04-26 ENCOUNTER — Ambulatory Visit (INDEPENDENT_AMBULATORY_CARE_PROVIDER_SITE_OTHER): Payer: Medicare Other | Admitting: Gastroenterology

## 2015-04-26 ENCOUNTER — Encounter: Payer: Self-pay | Admitting: Gastroenterology

## 2015-04-26 VITALS — BP 134/63 | HR 51 | Temp 97.7°F | Ht 65.5 in | Wt 188.2 lb

## 2015-04-26 DIAGNOSIS — R195 Other fecal abnormalities: Secondary | ICD-10-CM

## 2015-04-26 DIAGNOSIS — K219 Gastro-esophageal reflux disease without esophagitis: Secondary | ICD-10-CM

## 2015-04-26 NOTE — Progress Notes (Signed)
Referring Provider: Kathyrn Drown, MD Primary Care Physician:  Sallee Lange, MD  Chief Complaint  Patient presents with  . Follow-up    recently put herself on probiotics/ had abdominal cramping  afterwards/ black stool x 1 last tues    HPI:   [name] is a 79 y.o. female presenting today with a history of heme positive stool, GERD, possible melena last year. Colonoscopy completed with tubular adenoma. EGD not completed as she had not had any further dark stool and Hgb was stable.   Pitch black stool last Tuesday, one incident. Rare greens. No pepto. No iron. CBC 2 days ago with Hgb 13. Lower extremity edema better since changing medication. No N/V. No GERD exacerbation. Bm daily.   Past Medical History  Diagnosis Date  . Hypertension   . Hernia   . Thyroid disease   . Leg swelling   . Hernia     right inguinal hernia  . Osteopenia May 2010  . Osteitis pubis July 2002  . Hypothyroidism   . Depression   . H/O hiatal hernia     Past Surgical History  Procedure Laterality Date  . Appendectomy  03/1966  . Abdominal hysterectomy  03/1970  . Cholecystectomy  1980  . Tumor removal  03/1968    ovarian  . Colonoscopy  1999  . Colonoscopy  March 2007    Repeat 10 years  . Esophagogastroduodenoscopy  2007    Dr. Gala Romney: normal esophagus, small tom oderate sized hiatal hernia, Cameron lesions  . Colonoscopy  11/06/2003     Internal and external hemorrhoids/Two tiny rectal distal sigmoid polyps cold biopsied/Otherwise within normal limits to the cecum  . Colonoscopy N/A 08/29/2014    Dr. Gala Romney: Sessile polyp was found at the cecum status post cold snare polypectomy, tubular adenoma. No further surveillance unless clinical status changes  . Bilateral salpingoophorectomy    . Cataract extraction w/phaco Right 09/25/2014    Procedure: CATARACT EXTRACTION PHACO AND INTRAOCULAR LENS PLACEMENT (IOC);  Surgeon: Elta Guadeloupe T. Gershon Crane, MD;  Location: [facility];  Service: Ophthalmology;   Laterality: Right;  CDE:7.56  . Cataract extraction w/phaco Left 10/09/2014    Procedure: CATARACT EXTRACTION PHACO AND INTRAOCULAR LENS PLACEMENT LEFT EYE CDE=6.51;  Surgeon: Elta Guadeloupe T. Gershon Crane, MD;  Location: [facility];  Service: Ophthalmology;  Laterality: Left;    Current Outpatient Prescriptions  Medication Sig Dispense Refill  . acetaminophen (TYLENOL) 325 MG tablet Take 325 mg by mouth daily as needed for mild pain.    Marland Kitchen ALPRAZolam (XANAX) 0.5 MG tablet TAKE 1 TABLET BY MOUTH TWICE DAILY AS NEEDED 60 tablet 4  . atenolol (TENORMIN) 50 MG tablet TAKE 1 TABLET BY MOUTH DAILY 90 tablet 0  . Calcium Carbonate-Vitamin D (CALTRATE 600+D PO) Take 1 tablet by mouth daily.     . Cholecalciferol (VITAMIN D3) 2000 UNITS TABS Take 2 tablets by mouth daily.     . indapamide (LOZOL) 2.5 MG tablet Take 1 tablet (2.5 mg total) by mouth daily. 30 tablet 5  . levothyroxine (SYNTHROID, LEVOTHROID) 112 MCG tablet Take 1 tablet (112 mcg total) by mouth daily. 90 tablet 3  . pantoprazole (PROTONIX) 40 MG tablet TAKE 1 TABLET BY MOUTH EVERY DAY 90 tablet 0  . potassium chloride (KLOR-CON M10) 10 MEQ tablet Take 1 tablet (10 mEq total) by mouth daily. 90 tablet 1  . Probiotic Product (SOLUBLE FIBER/PROBIOTICS PO) Take by mouth. One capsule daily    . venlafaxine XR (EFFEXOR-XR)  150 MG 24 hr capsule Take 1 capsule (150 mg total) by mouth daily. 90 capsule 1   No current facility-administered medications for this visit.    Allergies as of 04/26/2015 - Review Complete 04/26/2015  Allergen Reaction Noted  . Erythromycin Shortness Of Breath 04/15/2012  . Aspirin Other (See Comments) 04/15/2012  . Ibuprofen  04/15/2012    Family History  Problem Relation Age of Onset  . Dementia Mother   . Heart disease Father   . Cancer Father     throat  . Cancer Sister     breast  . Colon cancer Neg Hx     History   Social History  . Marital Status: Married    Spouse Name: N/A  . Number of Children: N/A  . Years  of Education: N/A   Occupational History  . Retired     Orthoptist, retired in [place]  . Smoking status: Never Smoker   . Smokeless tobacco: Never Used     Comment: Never smoked  . Alcohol Use: 0.0 oz/week    0 Standard drinks or equivalent per week     Comment: occasional margarita  . Drug Use: No  . Sexual Activity: Yes    Birth Control/ Protection: Surgical   Other Topics Concern  . None   Social History Narrative    Review of Systems: As mentioned in HPI.  Physical Exam: BP 134/63 mmHg  Pulse 51  Temp(Src) 97.7 F (36.5 C) (Oral)  Ht 5' 5.5" (1.664 m)  Wt 188 lb 3.2 oz (85.367 kg)  BMI 30.83 kg/m2 General:   Alert and oriented. No distress noted. Pleasant and cooperative.  Head:  Normocephalic and atraumatic. Eyes:  Conjuctiva clear without scleral icterus. Abdomen:  +BS, soft, non-tender and non-distended. No rebound or guarding. No HSM or masses noted. Msk:  Symmetrical without gross deformities. Normal posture. Extremities:  Without edema. Neurologic:  Alert and  oriented x4;  grossly normal neurologically. Psych:  Alert and cooperative. Normal mood and affect.  Apr 28, 2015: Hgb 13.0, ferritin 45.

## 2015-04-26 NOTE — Patient Instructions (Signed)
We will see you back in 1 year!  Please let me know if you have any further issues.

## 2015-04-28 ENCOUNTER — Encounter: Payer: Self-pay | Admitting: Family Medicine

## 2015-05-06 ENCOUNTER — Encounter: Payer: Self-pay | Admitting: Gastroenterology

## 2015-05-06 NOTE — Assessment & Plan Note (Signed)
With recent colonoscopy completed and tubular adenoma noted. Due to age, no further surveillance indicated unless clinical status changes. Question of dark stool, ?melena in past and again most recently, but I highly doubt this is true melena, as she has had no change in Hgb. EGD deferred at time of colonoscopy as dark stool had resolved. Doubt an EGD would shed further light on this. Would monitor for now. Consider EGD if further concern for dark stool OR drop in Hgb. As of note, she is not on iron or taking pepto per her report. Limited dark "greens".

## 2015-05-06 NOTE — Assessment & Plan Note (Signed)
Continue Protonix once daily.  

## 2015-05-09 NOTE — Progress Notes (Signed)
CC'ED TO PCP 

## 2015-05-15 ENCOUNTER — Other Ambulatory Visit: Payer: Self-pay | Admitting: Family Medicine

## 2015-05-16 DIAGNOSIS — C44621 Squamous cell carcinoma of skin of unspecified upper limb, including shoulder: Secondary | ICD-10-CM | POA: Diagnosis not present

## 2015-05-16 DIAGNOSIS — C4442 Squamous cell carcinoma of skin of scalp and neck: Secondary | ICD-10-CM | POA: Diagnosis not present

## 2015-05-24 ENCOUNTER — Encounter: Payer: Self-pay | Admitting: Cardiovascular Disease

## 2015-05-24 ENCOUNTER — Ambulatory Visit (INDEPENDENT_AMBULATORY_CARE_PROVIDER_SITE_OTHER): Payer: Medicare Other | Admitting: Cardiovascular Disease

## 2015-05-24 VITALS — BP 136/74 | HR 53 | Ht 65.5 in | Wt 186.3 lb

## 2015-05-24 DIAGNOSIS — R0602 Shortness of breath: Secondary | ICD-10-CM

## 2015-05-24 DIAGNOSIS — R0609 Other forms of dyspnea: Secondary | ICD-10-CM | POA: Diagnosis not present

## 2015-05-24 DIAGNOSIS — R600 Localized edema: Secondary | ICD-10-CM | POA: Diagnosis not present

## 2015-05-24 NOTE — Progress Notes (Signed)
Patient ID: DIMITRI DSOUZA, female   DOB: 1936-03-05, 79 y.o.   MRN: [id number]      Cardiology Office Note   Date:  05/24/2015   ID:  CRISTALLE ROHM, DOB Jan 14, 1936, MRN [id number]  PCP:  Sallee Lange, MD  Cardiologist:   Sanda Klein, MD   Chief Complaint  Patient presents with  . New Evaluation     New patient exam; referred for elevated bmp and edema by Dr Sallee Lange; no chest pain, no shortness of breath, has edema in legs, feet and ankles, has pain in knees, left leg and arm, occassional cramping in legs, no lightheadedness, no dizziness      History of Present Illness: EOLA WALDREP is a 79 y.o. female who presents for evaluation of exertional dyspnea and lower extremity edema. Dr. Wolfgang Phoenix recently added indapamide to her medical regimen which led to complete resolution of her ankle swelling. However she continues to complain of NYHA functional class II dyspnea (for example walking up an incline or taking out the garbage can) which has occurred insidiously over the last year or so.  She has less energy than in the past. She reports having to work 10 minutes then rest 15 minutes.  She has a long-standing history of systemic hypertension, although she only started taking antihypertensives medications at the age of 43. She has well treated hypothyroidism history of hiatal hernia and osteopenia but has otherwise been remarkably healthy.  She is borderline obese with a BMI just over 30.  A chest x-ray performed 13 years ago showed cardiomegaly , but Mrs. Sambrano says this was not investigated further. She does not have diabetes mellitus and has an excellent lipid profile. She has never smoked and there is no strong family history of coronary disease.  The patient specifically denies any chest pain at rest exertion, dyspnea at res, orthopnea, paroxysmal nocturnal dyspnea, syncope, palpitations, focal neurological deficits, intermittent claudication, unexplained weight gain, cough,  hemoptysis or wheezing.   her husband Gwyndolyn Saxon is also my patient.   Past Medical History  Diagnosis Date  . Hypertension   . Hernia   . Thyroid disease   . Leg swelling   . Hernia     right inguinal hernia  . Osteopenia May 2010  . Osteitis pubis July 2002  . Hypothyroidism   . Depression   . H/O hiatal hernia     Past Surgical History  Procedure Laterality Date  . Appendectomy  03/1966  . Abdominal hysterectomy  03/1970  . Cholecystectomy  1980  . Tumor removal  03/1968    ovarian  . Colonoscopy  1999  . Colonoscopy  March 2007    Repeat 10 years  . Esophagogastroduodenoscopy  2007    Dr. Gala Romney: normal esophagus, small tom oderate sized hiatal hernia, Cameron lesions  . Colonoscopy  11/06/2003     Internal and external hemorrhoids/Two tiny rectal distal sigmoid polyps cold biopsied/Otherwise within normal limits to the cecum  . Colonoscopy N/A 08/29/2014    Dr. Gala Romney: Sessile polyp was found at the cecum status post cold snare polypectomy, tubular adenoma. No further surveillance unless clinical status changes  . Bilateral salpingoophorectomy    . Cataract extraction w/phaco Right 09/25/2014    Procedure: CATARACT EXTRACTION PHACO AND INTRAOCULAR LENS PLACEMENT (IOC);  Surgeon: Elta Guadeloupe T. Gershon Crane, MD;  Location: [facility];  Service: Ophthalmology;  Laterality: Right;  CDE:7.56  . Cataract extraction w/phaco Left 10/09/2014    Procedure: CATARACT EXTRACTION PHACO AND INTRAOCULAR LENS PLACEMENT LEFT  EYE CDE=6.51;  Surgeon: Elta Guadeloupe T. Gershon Crane, MD;  Location: [facility];  Service: Ophthalmology;  Laterality: Left;     Current Outpatient Prescriptions  Medication Sig Dispense Refill  . acetaminophen (TYLENOL) 325 MG tablet Take 325 mg by mouth daily as needed for mild pain.    Marland Kitchen ALPRAZolam (XANAX) 0.5 MG tablet TAKE 1 TABLET BY MOUTH TWICE DAILY AS NEEDED 60 tablet 4  . atenolol (TENORMIN) 50 MG tablet TAKE 1 TABLET BY MOUTH DAILY 90 tablet 1  . Calcium Carbonate-Vitamin D (CALTRATE 600+D  PO) Take 1 tablet by mouth daily.     . Cholecalciferol (VITAMIN D3) 2000 UNITS TABS Take 2 tablets by mouth daily.     . indapamide (LOZOL) 2.5 MG tablet Take 1 tablet (2.5 mg total) by mouth daily. 30 tablet 5  . levothyroxine (SYNTHROID, LEVOTHROID) 112 MCG tablet Take 1 tablet (112 mcg total) by mouth daily. 90 tablet 3  . pantoprazole (PROTONIX) 40 MG tablet TAKE 1 TABLET BY MOUTH EVERY DAY 90 tablet 0  . potassium chloride (KLOR-CON M10) 10 MEQ tablet Take 1 tablet (10 mEq total) by mouth daily. 90 tablet 1  . Probiotic Product (SOLUBLE FIBER/PROBIOTICS PO) Take by mouth. One capsule daily    . venlafaxine XR (EFFEXOR-XR) 150 MG 24 hr capsule Take 1 capsule (150 mg total) by mouth daily. 90 capsule 1   No current facility-administered medications for this visit.    Allergies:   Erythromycin; Aspirin; and Ibuprofen    Social History:  The patient  reports that she has never smoked. She has never used smokeless tobacco. She reports that she drinks alcohol. She reports that she does not use illicit drugs.   Family History:  The patient's family history includes Cancer in her father and sister; Dementia in her mother; Heart disease in her father. There is no history of Colon cancer.    ROS:  Please see the history of present illness.    Otherwise, review of systems positive for none.   All other systems are reviewed and negative.    PHYSICAL EXAM: VS:  BP 136/74 mmHg  Pulse 53  Ht 5' 5.5" (1.664 m)  Wt 186 lb 4.8 oz (84.505 kg)  BMI 30.52 kg/m2 , BMI Body mass index is 30.52 kg/(m^2).  General: Alert, oriented x3, no distress Head: no evidence of trauma, PERRL, EOMI, no exophtalmos or lid lag, no myxedema, no xanthelasma; normal ears, nose and oropharynx Neck: normal jugular venous pulsations and no hepatojugular reflux; brisk carotid pulses without delay and no carotid bruits Chest: clear to auscultation, no signs of consolidation by percussion or palpation, normal fremitus,  symmetrical and full respiratory excursions Cardiovascular: normal position and quality of the apical impulse, regular rhythm, normal first and second heart sounds, no murmurs, rubs or gallops Abdomen: no tenderness or distention, no masses by palpation, no abnormal pulsatility or arterial bruits, normal bowel sounds, no hepatosplenomegaly Extremities: no clubbing, cyanosis or edema; 2+ radial, ulnar and brachial pulses bilaterally; 2+ right femoral, posterior tibial and dorsalis pedis pulses; 2+ left femoral, posterior tibial and dorsalis pedis pulses; no subclavian or femoral bruits Neurological: grossly nonfocal Psych: euthymic mood, full affect   EKG:  EKG is ordered today. The ekg ordered today demonstrates  Sinus bradycardia, sharp Q waves in leads 1 and aVL, no repolarization abnormalities, QT interval 424 ms   Recent Labs: 06/12/2014: Platelets 335 09/19/2014: Hemoglobin 11.9* 12/27/2014: TSH 2.235 04/10/2015: ALT 13; BNP 163.5*; BUN 14; Creatinine, Ser 0.77; Potassium 4.1; Sodium  138    Lipid Panel    Component Value Date/Time   CHOL 164 06/12/2014 0950   TRIG 107 06/12/2014 0950   HDL 69 06/12/2014 0950   CHOLHDL 2.4 06/12/2014 0950   VLDL 21 06/12/2014 0950   LDLCALC 74 06/12/2014 0950      Wt Readings from Last 3 Encounters:  05/24/15 186 lb 4.8 oz (84.505 kg)  04/26/15 188 lb 3.2 oz (85.367 kg)  04/24/15 188 lb 12.8 oz (85.639 kg)      Other studies Reviewed: Additional studies/ records that were reviewed today include:  PCP notes , previous x-ray tests and labs   ASSESSMENT AND PLAN:   Mrs. Bangert  Has subtle evidence of possible congestive heart failure with lower extremity edema, exertional dyspnea and reported cardiomegaly on a previous chest x-ray. However her symptoms are mild and she improved quickly with a very weak diuretic.  Her BNP is minimally elevated , actually probably normal range for a woman in her 51s. I agree with Dr. Wolfgang Phoenix that the logical  next In her evaluation as an echocardiogram. It is quite possible we will see evidence of diastolic dysfunction.  If there is no evidence of significant structural heart disease , would consider reducing the dose of her beta blocker since her symptoms could also be explained by drug-induced chronotropic incompetence.  The suspicion for coronary insufficiency is low , with age and well treated hypertension being her only risk factors and without any atypical features to suggest angina pectoris.  Obesity is likely contributing to her exertional dyspnea and she is encouraged to lose weight by resuming regular exercise and restricting carbohydrate and saturated fat calories in her diet.   Current medicines are reviewed at length with the patient today.  The patient does not have concerns regarding medicines.  The following changes have been made:  no change  Labs/ tests ordered today include:  Orders Placed This Encounter  Procedures  . ECHOCARDIOGRAM COMPLETE   Patient Instructions  Your physician has requested that you have an echocardiogram. Echocardiography is a painless test that uses sound waves to create images of your heart. It provides your doctor with information about the size and shape of your heart and how well your heart's chambers and valves are working. This procedure takes approximately one hour. There are no restrictions for this procedure.  Your physician recommends that you schedule a follow-up appointment as needed with Dr. Sallyanne Kuster.     Mikael Spray, MD  05/24/2015 8:31 AM    Sanda Klein, MD, [facility] [phone] office [phone] pager

## 2015-05-24 NOTE — Patient Instructions (Signed)
Your physician has requested that you have an echocardiogram. Echocardiography is a painless test that uses sound waves to create images of your heart. It provides your doctor with information about the size and shape of your heart and how well your heart's chambers and valves are working. This procedure takes approximately one hour. There are no restrictions for this procedure.  Your physician recommends that you schedule a follow-up appointment as needed with Dr. Sallyanne Kuster.

## 2015-06-04 ENCOUNTER — Telehealth: Payer: Self-pay | Admitting: *Deleted

## 2015-06-04 ENCOUNTER — Ambulatory Visit (HOSPITAL_COMMUNITY)
Admission: RE | Admit: 2015-06-04 | Discharge: 2015-06-04 | Disposition: A | Discharge status: Home / Self Care | Payer: Medicare Other | Source: Ambulatory Visit | Attending: Cardiovascular Disease | Admitting: Cardiovascular Disease

## 2015-06-04 DIAGNOSIS — I517 Cardiomegaly: Secondary | ICD-10-CM | POA: Diagnosis not present

## 2015-06-04 DIAGNOSIS — R0600 Dyspnea, unspecified: Secondary | ICD-10-CM | POA: Diagnosis present

## 2015-06-04 DIAGNOSIS — I340 Nonrheumatic mitral (valve) insufficiency: Secondary | ICD-10-CM | POA: Diagnosis not present

## 2015-06-04 DIAGNOSIS — R0602 Shortness of breath: Secondary | ICD-10-CM

## 2015-06-04 DIAGNOSIS — I351 Nonrheumatic aortic (valve) insufficiency: Secondary | ICD-10-CM | POA: Diagnosis not present

## 2015-06-04 NOTE — Telephone Encounter (Signed)
Echo results called to patient.  Voiced understanding. 

## 2015-06-09 ENCOUNTER — Other Ambulatory Visit: Payer: Self-pay | Admitting: Family Medicine

## 2015-06-18 ENCOUNTER — Other Ambulatory Visit: Payer: Self-pay | Admitting: Family Medicine

## 2015-06-24 ENCOUNTER — Encounter: Payer: Self-pay | Admitting: Family Medicine

## 2015-06-24 ENCOUNTER — Ambulatory Visit (INDEPENDENT_AMBULATORY_CARE_PROVIDER_SITE_OTHER): Payer: Medicare Other | Admitting: Family Medicine

## 2015-06-24 VITALS — BP 108/72 | Ht 65.5 in | Wt 184.2 lb

## 2015-06-24 DIAGNOSIS — I10 Essential (primary) hypertension: Secondary | ICD-10-CM

## 2015-06-24 DIAGNOSIS — E038 Other specified hypothyroidism: Secondary | ICD-10-CM | POA: Diagnosis not present

## 2015-06-24 DIAGNOSIS — E785 Hyperlipidemia, unspecified: Secondary | ICD-10-CM | POA: Diagnosis not present

## 2015-06-24 MED ORDER — INDAPAMIDE 2.5 MG PO TABS: 2.5000 mg | ORAL_TABLET | Freq: Every day | ORAL | Status: DC

## 2015-06-24 MED ORDER — PANTOPRAZOLE SODIUM 40 MG PO TBEC: 40.0000 mg | DELAYED_RELEASE_TABLET | Freq: Every day | ORAL | Status: DC

## 2015-06-24 MED ORDER — POTASSIUM CHLORIDE CRYS ER 10 MEQ PO TBCR: 10.0000 meq | EXTENDED_RELEASE_TABLET | Freq: Every day | ORAL | Status: DC

## 2015-06-24 MED ORDER — VENLAFAXINE HCL ER 75 MG PO CP24: 75.0000 mg | ORAL_CAPSULE | Freq: Every day | ORAL | Status: DC

## 2015-06-24 MED ORDER — LEVOTHYROXINE SODIUM 112 MCG PO TABS: 112.0000 ug | ORAL_TABLET | Freq: Every day | ORAL | Status: DC

## 2015-06-24 NOTE — Progress Notes (Signed)
   Subjective:    Patient ID: [name], female    DOB: 18-Jan-1936, 79 y.o.   MRN: [id number]  Hypertension This is a chronic problem. The current episode started more than 1 year ago. The problem has been gradually improving since onset. Pertinent negatives include no chest pain. There are no associated agents to hypertension. There are no known risk factors for coronary artery disease. Treatments tried: indapamide, atenolol. The current treatment provides moderate improvement. There are no compliance problems.    Patient states she has no new concerns at this time.  We had a long discussion regarding recent evaluation by the cardiologist I went over her echo report went over how it's very important keep blood pressure under good control as well as cholesterol and to take her medicines  Review of Systems  Constitutional: Negative for activity change, appetite change and fatigue.  HENT: Negative for congestion.   Respiratory: Negative for cough.   Cardiovascular: Negative for chest pain.  Gastrointestinal: Negative for abdominal pain.  Endocrine: Negative for polydipsia and polyphagia.  Neurological: Negative for weakness.  Psychiatric/Behavioral: Negative for confusion.       Objective:   Physical Exam  Constitutional: She appears well-nourished. No distress.  Cardiovascular: Normal rate, regular rhythm and normal heart sounds.   No murmur heard. Pulmonary/Chest: Effort normal and breath sounds normal. No respiratory distress.  Musculoskeletal: She exhibits no edema.  Lymphadenopathy:    She has no cervical adenopathy.  Neurological: She is alert. She exhibits normal muscle tone.  Psychiatric: Her behavior is normal.  Vitals reviewed.         Assessment & Plan:  HTN Stable  1. Essential hypertension, benign Blood pressure stable continue current measures watch diet - Basic metabolic panel  2. Other specified hypothyroidism Check TSH hypothyroidism stable energy  level fair for her age - TSH  3. Hyperlipidemia Lipid control in the past is been good  Diastolic dysfunction-under good control currently pedal edema not severe check metabolic 7 because of diuretic's  Reflux under good control continuing her medication  Patient relates Effexor causes nausea times her moods are doing good we talked about this she agrees to go back to 75 mg dosage if she has problems she will let us know

## 2015-06-25 ENCOUNTER — Other Ambulatory Visit: Payer: Self-pay | Admitting: *Deleted

## 2015-06-25 ENCOUNTER — Encounter: Payer: Self-pay | Admitting: *Deleted

## 2015-06-25 DIAGNOSIS — E039 Hypothyroidism, unspecified: Secondary | ICD-10-CM

## 2015-06-25 LAB — BASIC METABOLIC PANEL
BUN/Creatinine Ratio: 17 (ref 11–26)
BUN: 14 mg/dL (ref 8–27)
CO2: 28 mmol/L (ref 18–29)
CREATININE: 0.81 mg/dL (ref 0.57–1.00)
Calcium: 9.1 mg/dL (ref 8.7–10.3)
Chloride: 92 mmol/L — ABNORMAL LOW (ref 97–108)
GFR calc Af Amer: 81 mL/min/{1.73_m2} (ref >59)
GFR calc non Af Amer: 70 mL/min/{1.73_m2} (ref >59)
Glucose: 99 mg/dL (ref 65–99)
POTASSIUM: 3.7 mmol/L (ref 3.5–5.2)
SODIUM: 137 mmol/L (ref 134–144)

## 2015-06-25 LAB — TSH: TSH: 0.342 u[IU]/mL — AB (ref 0.450–4.500)

## 2015-07-10 ENCOUNTER — Other Ambulatory Visit: Payer: Self-pay | Admitting: Family Medicine

## 2015-07-10 NOTE — Telephone Encounter (Signed)
May refill 4

## 2015-07-16 IMAGING — MG MM DIGITAL SCREENING BILAT
5 series · 5 of 5 positions shown · non-contrast
Comparison: Previous exam(s).

CLINICAL DATA: Screening.

EXAM:
DIGITAL SCREENING BILATERAL MAMMOGRAM WITH CAD

[L CC]
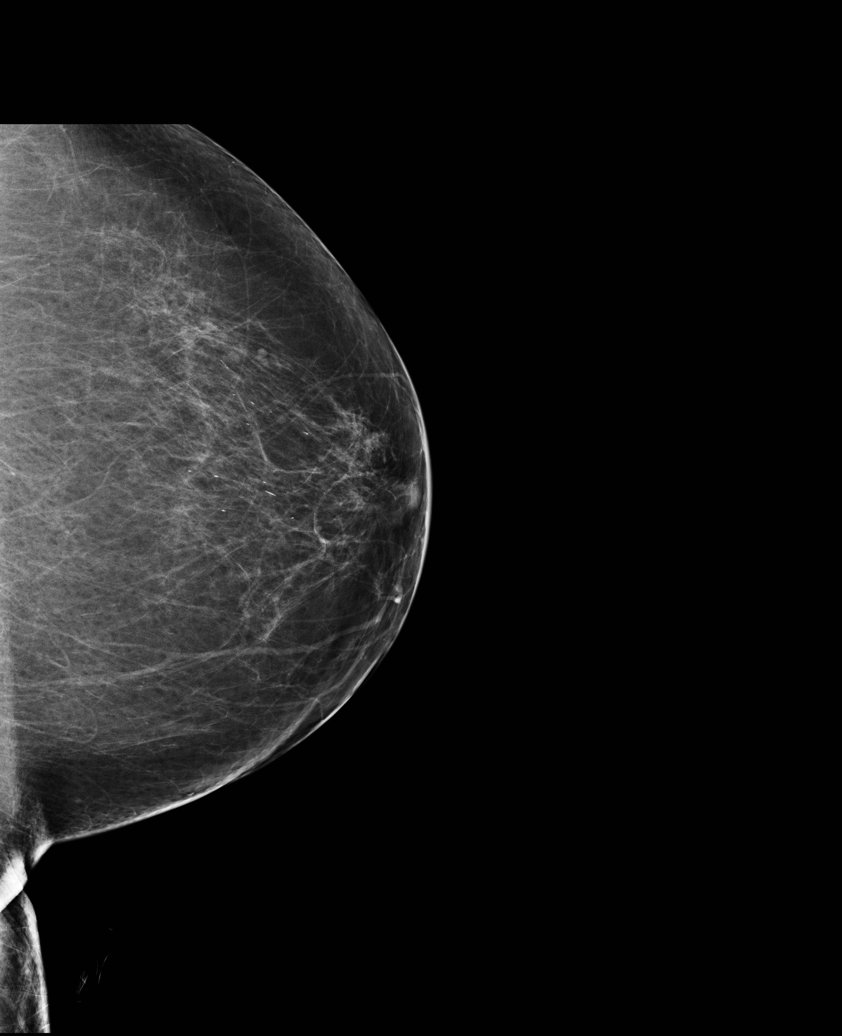

[L MLO (1 of 2)]
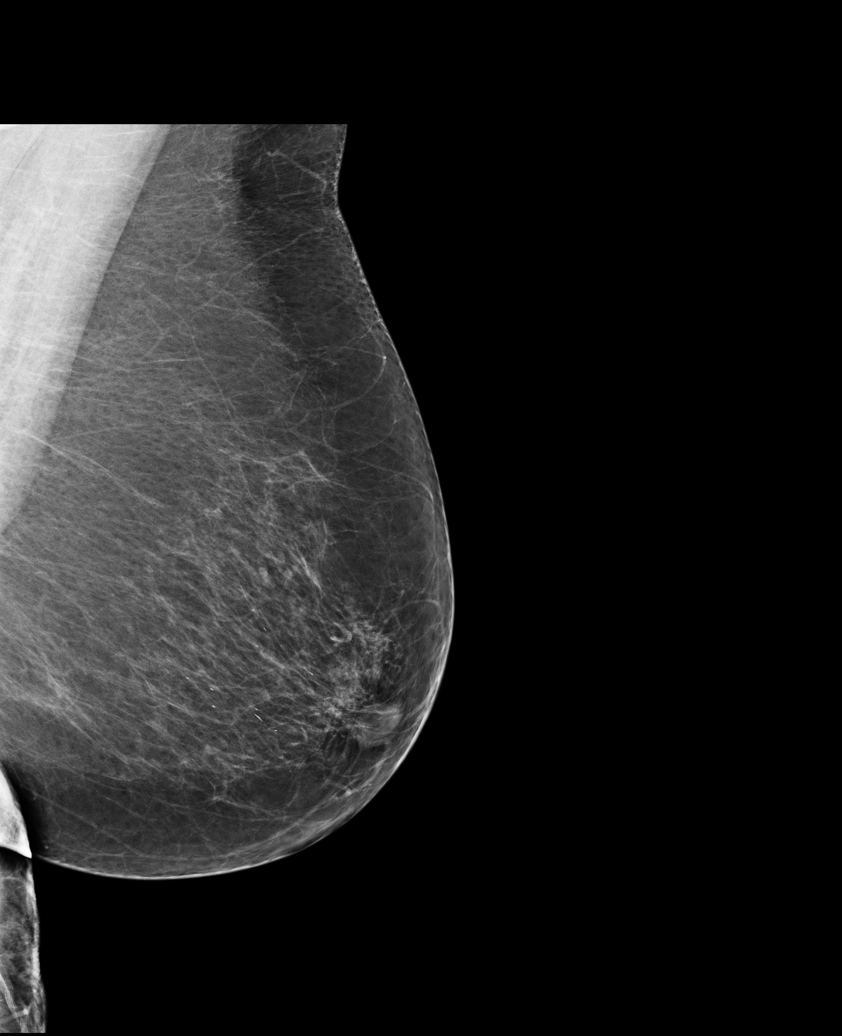

[R CC]
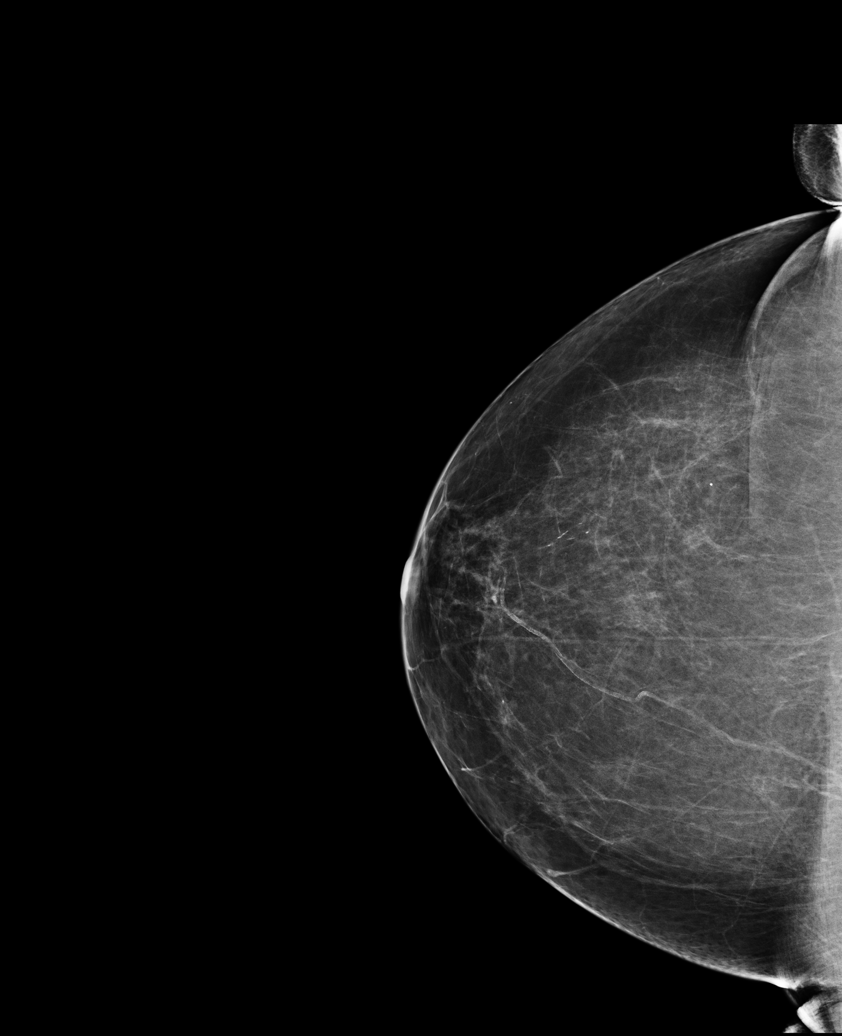

[R MLO]
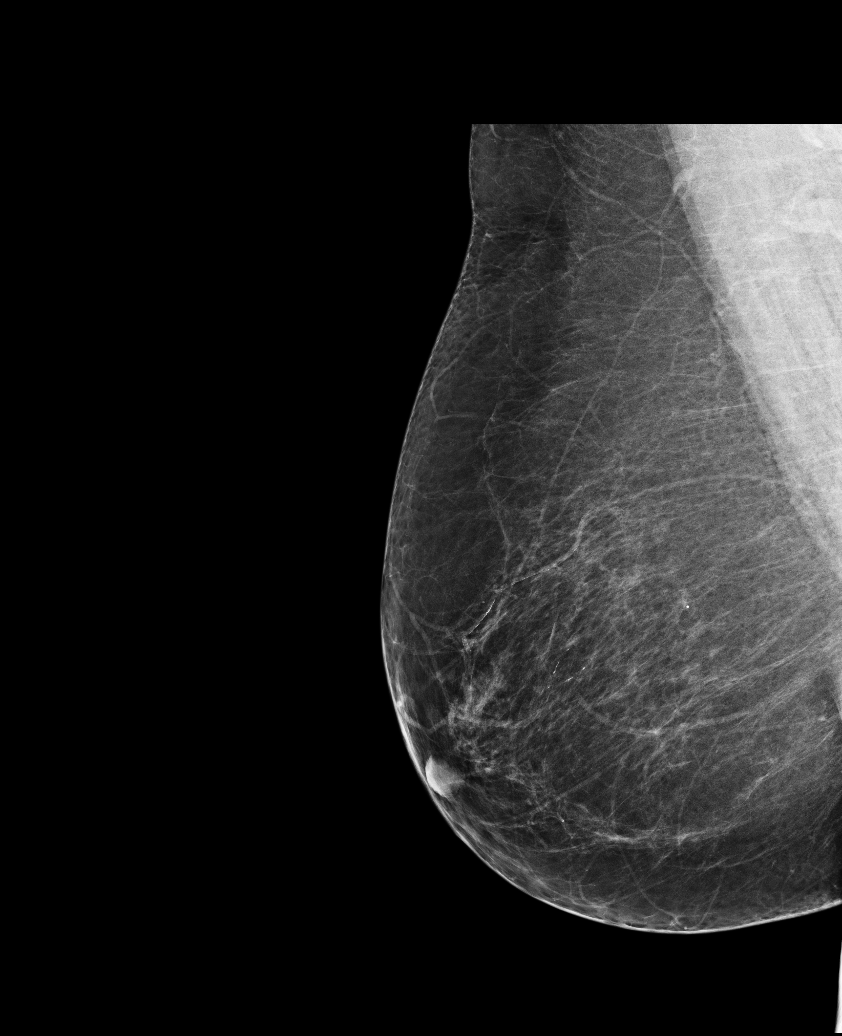

[L MLO (2 of 2)]
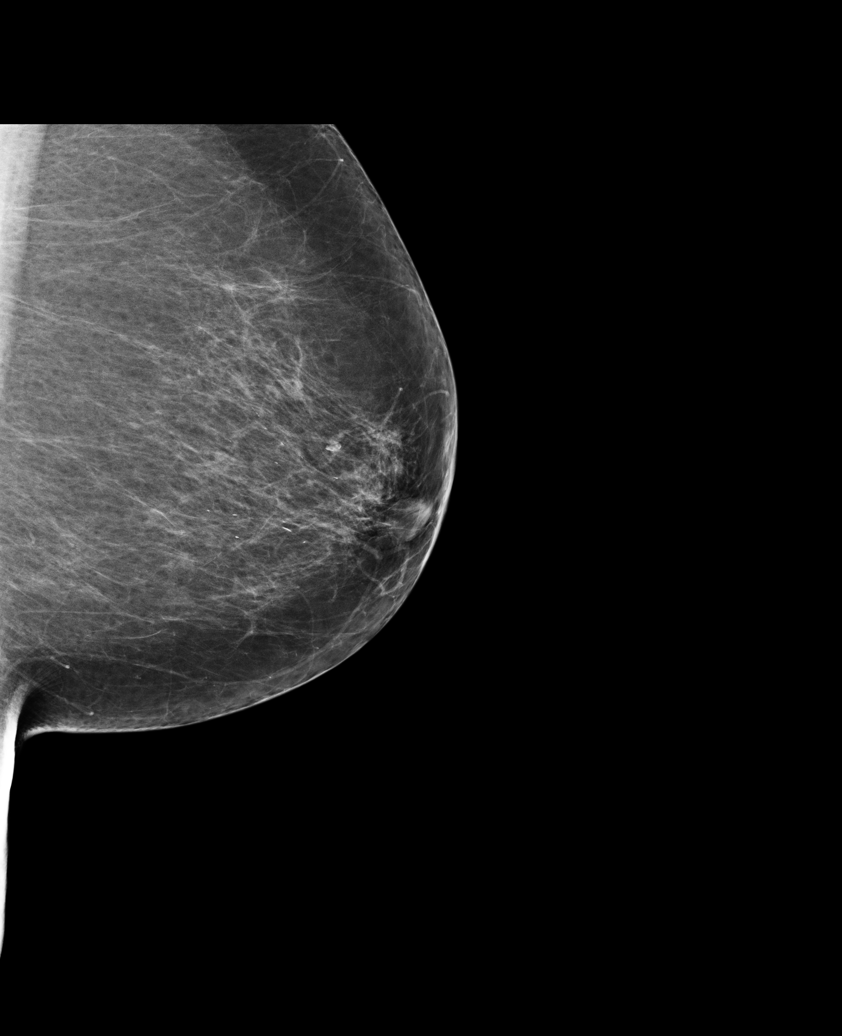

[5 of 5 positions shown; findings below may reference images not displayed]

ACR Breast Density Category b: There are scattered areas of
fibroglandular density.
FINDINGS: There has been no significant interval change and there are no
findings suspicious for malignancy. Images were processed with CAD.
IMPRESSION: No mammographic evidence of malignancy. A result letter of this
screening mammogram will be mailed directly to the patient.

RECOMMENDATION:
Screening mammogram in one year. (Code:[id number]-[id number])

BI-RADS CATEGORY  1: Negative.

## 2015-09-27 DIAGNOSIS — Z23 Encounter for immunization: Secondary | ICD-10-CM | POA: Diagnosis not present

## 2015-10-01 HISTORY — PX: SKIN CANCER EXCISION: SHX779

## 2015-10-14 DIAGNOSIS — E039 Hypothyroidism, unspecified: Secondary | ICD-10-CM | POA: Diagnosis not present

## 2015-10-15 LAB — TSH: TSH: 6 u[IU]/mL — AB (ref 0.450–4.500)

## 2015-10-18 ENCOUNTER — Other Ambulatory Visit: Payer: Self-pay | Admitting: Family Medicine

## 2015-10-21 ENCOUNTER — Ambulatory Visit (INDEPENDENT_AMBULATORY_CARE_PROVIDER_SITE_OTHER): Payer: Medicare Other | Admitting: Family Medicine

## 2015-10-21 VITALS — BP 136/82 | Ht 65.5 in | Wt 189.1 lb

## 2015-10-21 DIAGNOSIS — E785 Hyperlipidemia, unspecified: Secondary | ICD-10-CM

## 2015-10-21 DIAGNOSIS — E038 Other specified hypothyroidism: Secondary | ICD-10-CM

## 2015-10-21 DIAGNOSIS — I10 Essential (primary) hypertension: Secondary | ICD-10-CM | POA: Diagnosis not present

## 2015-10-21 DIAGNOSIS — K219 Gastro-esophageal reflux disease without esophagitis: Secondary | ICD-10-CM

## 2015-10-21 NOTE — Progress Notes (Signed)
   Subjective:    Patient ID: [name], female    DOB: 06/23/1936, 79 y.o.   MRN: [id number]  Hypertension This is a chronic problem. The current episode started more than 1 year ago. Pertinent negatives include no chest pain. There are no compliance problems.    patient states that her reflux under good control with her medication she takes her medicine on regular basis She also has thyroid issues recent test shows that it's underactive she takes her medicine but skips a dose on Mondays She brings her other medicines in for review including her blood pressure medicine she has 2 diuretics with her today HCTZ and indapamide. I told her that she only needs to be on the Lozol stop HCTZ. She states she has not been taking HCTZ.   Patient states no other concerns this visit.  Review of Systems  Constitutional: Negative for activity change, appetite change and fatigue.  HENT: Negative for congestion.   Respiratory: Negative for cough.   Cardiovascular: Negative for chest pain.  Gastrointestinal: Negative for abdominal pain.  Endocrine: Negative for polydipsia and polyphagia.  Neurological: Negative for weakness.  Psychiatric/Behavioral: Negative for confusion.       Objective:   Physical Exam  Constitutional: She appears well-nourished. No distress.  Cardiovascular: Normal rate, regular rhythm and normal heart sounds.   No murmur heard. Pulmonary/Chest: Effort normal and breath sounds normal. No respiratory distress.  Musculoskeletal: She exhibits no edema.  Lymphadenopathy:    She has no cervical adenopathy.  Neurological: She is alert. She exhibits normal muscle tone.  Psychiatric: Her behavior is normal.  Vitals reviewed.        Assessment & Plan:  Patient with recent lab work. HTN decent control continue current measures Hypothyroidism increase medication patient was skipping Mondays I recommend she doesn't half a tablet on Monday 1 tablet on all other days Continue  blood pressure medicine Takes PPI on a regular basis for reflux does well with this continue this Moods are fair continue Effexor under some stress with family illness but overall doing well

## 2015-11-18 DIAGNOSIS — L570 Actinic keratosis: Secondary | ICD-10-CM | POA: Diagnosis not present

## 2015-11-22 ENCOUNTER — Other Ambulatory Visit: Payer: Self-pay | Admitting: Family Medicine

## 2015-11-22 DIAGNOSIS — Z1231 Encounter for screening mammogram for malignant neoplasm of breast: Secondary | ICD-10-CM

## 2015-12-23 ENCOUNTER — Ambulatory Visit (HOSPITAL_COMMUNITY)
Admission: RE | Admit: 2015-12-23 | Discharge: 2015-12-23 | Disposition: A | Discharge status: Home / Self Care | Payer: Medicare Other | Source: Ambulatory Visit | Attending: Family Medicine | Admitting: Family Medicine

## 2015-12-23 DIAGNOSIS — Z1231 Encounter for screening mammogram for malignant neoplasm of breast: Secondary | ICD-10-CM | POA: Diagnosis not present

## 2015-12-31 ENCOUNTER — Other Ambulatory Visit: Payer: Self-pay | Admitting: Family Medicine

## 2016-01-01 DIAGNOSIS — E038 Other specified hypothyroidism: Secondary | ICD-10-CM | POA: Diagnosis not present

## 2016-01-02 ENCOUNTER — Encounter: Payer: Self-pay | Admitting: Family Medicine

## 2016-01-02 LAB — TSH: TSH: 1.09 u[IU]/mL (ref 0.450–4.500)

## 2016-01-08 ENCOUNTER — Other Ambulatory Visit: Payer: Self-pay | Admitting: Family Medicine

## 2016-01-08 NOTE — Telephone Encounter (Signed)
May have this and 4 refills 

## 2016-01-22 ENCOUNTER — Other Ambulatory Visit: Payer: Self-pay | Admitting: Family Medicine

## 2016-02-05 ENCOUNTER — Other Ambulatory Visit: Payer: Self-pay | Admitting: Family Medicine

## 2016-03-06 ENCOUNTER — Encounter: Payer: Self-pay | Admitting: Family Medicine

## 2016-03-06 ENCOUNTER — Telehealth: Payer: Self-pay | Admitting: Family Medicine

## 2016-03-06 ENCOUNTER — Ambulatory Visit (INDEPENDENT_AMBULATORY_CARE_PROVIDER_SITE_OTHER): Payer: Medicare Other | Admitting: Family Medicine

## 2016-03-06 VITALS — BP 130/78 | Temp 98.3°F | Ht 65.5 in | Wt 191.4 lb

## 2016-03-06 DIAGNOSIS — E785 Hyperlipidemia, unspecified: Secondary | ICD-10-CM

## 2016-03-06 DIAGNOSIS — R6889 Other general symptoms and signs: Secondary | ICD-10-CM

## 2016-03-06 DIAGNOSIS — J209 Acute bronchitis, unspecified: Secondary | ICD-10-CM

## 2016-03-06 DIAGNOSIS — J019 Acute sinusitis, unspecified: Secondary | ICD-10-CM

## 2016-03-06 DIAGNOSIS — Z79899 Other long term (current) drug therapy: Secondary | ICD-10-CM

## 2016-03-06 MED ORDER — ALBUTEROL SULFATE HFA 108 (90 BASE) MCG/ACT IN AERS: 2.0000 | INHALATION_SPRAY | RESPIRATORY_TRACT | Status: DC | PRN

## 2016-03-06 MED ORDER — AMOXICILLIN 500 MG PO TABS: 500.0000 mg | ORAL_TABLET | Freq: Three times a day (TID) | ORAL | Status: DC

## 2016-03-06 NOTE — Progress Notes (Signed)
   Subjective:    Patient ID: [name], female    DOB: 05/30/1936, 80 y.o.   MRN: [id number]  URI  This is a new problem. The current episode started in the past 7 days. The problem has been unchanged. Associated symptoms include coughing, headaches, a sore throat and wheezing. Associated symptoms comments: Shortness of breath, runny nose, fever. Treatments tried: coricidin. The treatment provided no relief.   Patient states that she has no other concerns at this time.  Patients husband had flulike illness earlier in week patient currently having a lot of head congestion drainage coughing sinus pressure in addition to this having some chest congestion difficulty breathing through her nose occasional wheezing but no true shortness of breath feeling very weak.  Review of Systems  Constitutional: Positive for fatigue.  HENT: Positive for sore throat.   Respiratory: Positive for cough and wheezing.   Musculoskeletal: Positive for myalgias.  Neurological: Positive for headaches.       Objective:   Physical Exam  Constitutional: She appears well-developed.  HENT:  Head: Normocephalic.  Nose: Nose normal.  Mouth/Throat: Oropharynx is clear and moist. No oropharyngeal exudate.  Neck: Neck supple.  Cardiovascular: Normal rate and normal heart sounds.   No murmur heard. Pulmonary/Chest: Effort normal and breath sounds normal. She has no wheezes.  Lymphadenopathy:    She has no cervical adenopathy.  Skin: Skin is warm and dry.  Nursing note and vitals reviewed.     Prescription for albuterol given that the patient may use if she has increased chest congestion wheezing if high fevers difficulty breathing or worse call follow-up    Assessment & Plan:  Viral URI Flulike illness Secondary bronchitis Antibiotics prescribed Warning signs discussed Follow-up if progressive troubles

## 2016-03-06 NOTE — Telephone Encounter (Signed)
Patient needs order for blood work ordered for appointment on Apr 20, 2016.

## 2016-03-06 NOTE — Patient Instructions (Signed)
If worse or problems please call

## 2016-03-08 NOTE — Telephone Encounter (Signed)
Lipid, liver, metabolic 7-please let the patient know that we are not checking her thyroid on this upcoming tests he cause she just had thyroid testing in February and it looked that it's under good control

## 2016-03-09 NOTE — Telephone Encounter (Signed)
Blood work ordered in EPIC. Patient notified. 

## 2016-03-17 ENCOUNTER — Encounter: Payer: Self-pay | Admitting: Internal Medicine

## 2016-04-13 DIAGNOSIS — Z79899 Other long term (current) drug therapy: Secondary | ICD-10-CM | POA: Diagnosis not present

## 2016-04-13 DIAGNOSIS — E785 Hyperlipidemia, unspecified: Secondary | ICD-10-CM | POA: Diagnosis not present

## 2016-04-14 DIAGNOSIS — Z961 Presence of intraocular lens: Secondary | ICD-10-CM | POA: Diagnosis not present

## 2016-04-14 DIAGNOSIS — H524 Presbyopia: Secondary | ICD-10-CM | POA: Diagnosis not present

## 2016-04-14 LAB — HEPATIC FUNCTION PANEL
ALBUMIN: 4.1 g/dL (ref 3.5–4.8)
ALT: 12 IU/L (ref 0–32)
AST: 17 IU/L (ref 0–40)
Alkaline Phosphatase: 69 IU/L (ref 39–117)
BILIRUBIN TOTAL: 0.5 mg/dL (ref 0.0–1.2)
Bilirubin, Direct: 0.15 mg/dL (ref 0.00–0.40)
TOTAL PROTEIN: 6.7 g/dL (ref 6.0–8.5)

## 2016-04-14 LAB — BASIC METABOLIC PANEL
BUN/Creatinine Ratio: 17 (ref 12–28)
BUN: 15 mg/dL (ref 8–27)
CALCIUM: 9.5 mg/dL (ref 8.7–10.3)
CO2: 28 mmol/L (ref 18–29)
Chloride: 98 mmol/L (ref 96–106)
Creatinine, Ser: 0.89 mg/dL (ref 0.57–1.00)
GFR calc Af Amer: 72 mL/min/{1.73_m2} (ref >59)
GFR, EST NON AFRICAN AMERICAN: 62 mL/min/{1.73_m2} (ref >59)
Glucose: 95 mg/dL (ref 65–99)
POTASSIUM: 4.3 mmol/L (ref 3.5–5.2)
Sodium: 141 mmol/L (ref 134–144)

## 2016-04-14 LAB — LIPID PANEL
CHOL/HDL RATIO: 2.4 ratio (ref 0.0–4.4)
Cholesterol, Total: 163 mg/dL (ref 100–199)
HDL: 67 mg/dL (ref >39)
LDL CALC: 81 mg/dL (ref 0–99)
Triglycerides: 74 mg/dL (ref 0–149)
VLDL CHOLESTEROL CAL: 15 mg/dL (ref 5–40)

## 2016-04-20 ENCOUNTER — Encounter: Payer: Self-pay | Admitting: Family Medicine

## 2016-04-20 ENCOUNTER — Ambulatory Visit (INDEPENDENT_AMBULATORY_CARE_PROVIDER_SITE_OTHER): Payer: Medicare Other | Admitting: Family Medicine

## 2016-04-20 VITALS — BP 138/72 | Temp 98.4°F | Ht 65.5 in | Wt 189.0 lb

## 2016-04-20 DIAGNOSIS — E785 Hyperlipidemia, unspecified: Secondary | ICD-10-CM

## 2016-04-20 DIAGNOSIS — E038 Other specified hypothyroidism: Secondary | ICD-10-CM

## 2016-04-20 DIAGNOSIS — K219 Gastro-esophageal reflux disease without esophagitis: Secondary | ICD-10-CM | POA: Diagnosis not present

## 2016-04-20 DIAGNOSIS — I10 Essential (primary) hypertension: Secondary | ICD-10-CM | POA: Diagnosis not present

## 2016-04-20 MED ORDER — PANTOPRAZOLE SODIUM 40 MG PO TBEC: 40.0000 mg | DELAYED_RELEASE_TABLET | Freq: Every day | ORAL | Status: DC

## 2016-04-20 MED ORDER — INDAPAMIDE 2.5 MG PO TABS: ORAL_TABLET | ORAL | Status: DC

## 2016-04-20 MED ORDER — ATENOLOL 50 MG PO TABS
50.0000 mg | ORAL_TABLET | Freq: Every day | ORAL | Status: DC
Start: 2016-04-20 — End: 2016-10-16

## 2016-04-20 MED ORDER — ALPRAZOLAM 0.5 MG PO TABS: 0.5000 mg | ORAL_TABLET | Freq: Two times a day (BID) | ORAL | Status: DC | PRN

## 2016-04-20 MED ORDER — POTASSIUM CHLORIDE CRYS ER 10 MEQ PO TBCR: 10.0000 meq | EXTENDED_RELEASE_TABLET | Freq: Every day | ORAL | Status: DC

## 2016-04-20 MED ORDER — LEVOTHYROXINE SODIUM 112 MCG PO TABS: 112.0000 ug | ORAL_TABLET | Freq: Every day | ORAL | Status: DC

## 2016-04-20 NOTE — Progress Notes (Signed)
   Subjective:    Patient ID: [name], female    DOB: 01/04/1936, 80 y.o.   MRN: [id number]  Hypertension This is a chronic problem. The current episode started more than 1 year ago. Pertinent negatives include no chest pain. There are no compliance problems (eats healthy and walks, lifts hand weights).   Patient recently got lab work completed. Cholesterol looks good. Liver functions look good sodium potassium kidney functions look good. Thyroid function previously February stable Patient does not do a lot of walking. Denies any chest tightness pressure pain shortness breath with activity. She does state she gets fatigued at times. Denies chest pressure laying pain Pt concerned that she gets fatigued easily.   Dry hacking cough. Pt states she always has a cough. She denies fevers chills sweats denies weight loss denies hemoptysis  Cervical pain intermittent pain in the back of the neck on the right side  Review of Systems  Constitutional: Negative for activity change, appetite change and fatigue.  HENT: Negative for congestion.   Respiratory: Negative for cough.   Cardiovascular: Negative for chest pain.  Gastrointestinal: Negative for abdominal pain.  Endocrine: Negative for polydipsia and polyphagia.  Neurological: Negative for weakness.  Psychiatric/Behavioral: Negative for confusion.       Objective:   Physical Exam  Constitutional: She appears well-nourished. No distress.  Cardiovascular: Normal rate, regular rhythm and normal heart sounds.   No murmur heard. Pulmonary/Chest: Effort normal and breath sounds normal. No respiratory distress.  Musculoskeletal: She exhibits no edema.  Lymphadenopathy:    She has no cervical adenopathy.  Neurological: She is alert. She exhibits normal muscle tone.  Psychiatric: Her behavior is normal.  Vitals reviewed.   25 minutes was spent with the patient. Greater than half the time was spent in discussion and answering questions  and counseling regarding the issues that the patient came in for today.       Assessment & Plan:  1. Essential hypertension, benign Blood pressure under good control importance of taking medicine discuss. Low-salt diet increase walking recommended to try to improve her stamina 2. Gastroesophageal reflux disease without esophagitis Reflux has been under very good control I encouraged patient to try reducing her medication to every other day to see if it stays under control lessen the side effects potentials with the medication  3. Other specified hypothyroidism Thyroid under good control continue current measures recent lab work reviewed  4. Hyperlipidemia Cholesterol great continue current medication watch diet closely stay physically active #5 obesity importance of exercise watching weight try to bring weight down Patient denies falls and depression

## 2016-05-01 ENCOUNTER — Ambulatory Visit: Payer: Medicare Other | Admitting: Internal Medicine

## 2016-05-11 DIAGNOSIS — L570 Actinic keratosis: Secondary | ICD-10-CM | POA: Diagnosis not present

## 2016-05-12 ENCOUNTER — Encounter: Payer: Self-pay | Admitting: Internal Medicine

## 2016-05-12 ENCOUNTER — Ambulatory Visit (INDEPENDENT_AMBULATORY_CARE_PROVIDER_SITE_OTHER): Payer: Medicare Other | Admitting: Internal Medicine

## 2016-05-12 VITALS — BP 130/65 | HR 58 | Temp 98.4°F | Ht 65.5 in | Wt 188.2 lb

## 2016-05-12 DIAGNOSIS — K219 Gastro-esophageal reflux disease without esophagitis: Secondary | ICD-10-CM | POA: Diagnosis not present

## 2016-05-12 NOTE — Progress Notes (Signed)
Primary Care Physician:  Sallee Lange, MD Primary Gastroenterologist:  Dr. Gala Romney  Pre-Procedure History & Physical: HPI:  [name] is a 80 y.o. female here for follow-up of GERD; history of colonic adenoma removed;  up to date on surveillance colonoscopy. Doing very well on pantoprazole 40 mg every other day. Her big challenge these days is obesity  -  trying to obtain a more healthy BMI. She started to walk with her husband every day. Hasn't had any melena or rectal bleeding.  No abdominal pain.  No dysphagia or odynophagia.  Past Medical History  Diagnosis Date  . Hypertension   . Hernia   . Thyroid disease   . Leg swelling   . Hernia     right inguinal hernia  . Osteopenia May 2010  . Osteitis pubis [facility]) July 2002  . Hypothyroidism   . Depression   . H/O hiatal hernia     Past Surgical History  Procedure Laterality Date  . Appendectomy  03/1966  . Abdominal hysterectomy  03/1970  . Cholecystectomy  1980  . Tumor removal  03/1968    ovarian  . Colonoscopy  1999  . Colonoscopy  March 2007    Repeat 10 years  . Esophagogastroduodenoscopy  2007    Dr. Gala Romney: normal esophagus, small tom oderate sized hiatal hernia, Cameron lesions  . Colonoscopy  11/06/2003     Internal and external hemorrhoids/Two tiny rectal distal sigmoid polyps cold biopsied/Otherwise within normal limits to the cecum  . Colonoscopy N/A 08/29/2014    Dr. Gala Romney: Sessile polyp was found at the cecum status post cold snare polypectomy, tubular adenoma. No further surveillance unless clinical status changes  . Bilateral salpingoophorectomy    . Cataract extraction w/phaco Right 09/25/2014    Procedure: CATARACT EXTRACTION PHACO AND INTRAOCULAR LENS PLACEMENT (IOC);  Surgeon: Elta Guadeloupe T. Gershon Crane, MD;  Location: [facility];  Service: Ophthalmology;  Laterality: Right;  CDE:7.56  . Cataract extraction w/phaco Left 10/09/2014    Procedure: CATARACT EXTRACTION PHACO AND INTRAOCULAR LENS PLACEMENT LEFT EYE  CDE=6.51;  Surgeon: Elta Guadeloupe T. Gershon Crane, MD;  Location: [facility];  Service: Ophthalmology;  Laterality: Left;  . Skin cancer excision Left 11 1 2016    left arm squamous cancer    Prior to Admission medications   Medication Sig Start Date End Date Taking? Authorizing Provider  acetaminophen (TYLENOL) 325 MG tablet Take 325 mg by mouth daily as needed for mild pain.   Yes Historical Provider, MD  ALPRAZolam Duanne Moron) 0.5 MG tablet Take 1 tablet (0.5 mg total) by mouth 2 (two) times daily as needed. 04/20/16  Yes Kathyrn Drown, MD  atenolol (TENORMIN) 50 MG tablet Take 1 tablet (50 mg total) by mouth daily. 04/20/16  Yes Kathyrn Drown, MD  Calcium Carbonate-Vitamin D (CALTRATE 600+D PO) Take 1 tablet by mouth daily.    Yes Historical Provider, MD  Cholecalciferol (VITAMIN D3) 2000 UNITS TABS Take 2 tablets by mouth daily.    Yes Historical Provider, MD  indapamide (LOZOL) 2.5 MG tablet TAKE 1 TABLET(2.5 MG) BY MOUTH DAILY 04/20/16  Yes Kathyrn Drown, MD  levothyroxine (SYNTHROID, LEVOTHROID) 112 MCG tablet Take 1 tablet (112 mcg total) by mouth daily. 04/20/16  Yes Kathyrn Drown, MD  pantoprazole (PROTONIX) 40 MG tablet Take 1 tablet (40 mg total) by mouth daily. Patient taking differently: Take 40 mg by mouth every other day.  04/20/16  Yes Kathyrn Drown, MD  potassium chloride (KLOR-CON M10) 10 MEQ  tablet Take 1 tablet (10 mEq total) by mouth daily. 04/20/16  Yes Kathyrn Drown, MD  Probiotic Product (SOLUBLE FIBER/PROBIOTICS PO) Take by mouth. One capsule daily   Yes Historical Provider, MD  albuterol (PROVENTIL HFA;VENTOLIN HFA) 108 (90 Base) MCG/ACT inhaler Inhale 2 puffs into the lungs every 4 (four) hours as needed for wheezing. Patient not taking: Reported on 04/20/2016 03/06/16   Kathyrn Drown, MD    Allergies as of 05/12/2016 - Review Complete 05/12/2016  Allergen Reaction Noted  . Erythromycin Shortness Of Breath 04/15/2012  . Aspirin Other (See Comments) 04/15/2012  . Ibuprofen  04/15/2012      Family History  Problem Relation Age of Onset  . Dementia Mother   . Heart disease Father   . Cancer Father     throat  . Cancer Sister     breast  . Colon cancer Neg Hx     Social History   Social History  . Marital Status: Married    Spouse Name: N/A  . Number of Children: N/A  . Years of Education: N/A   Occupational History  . Retired     Orthoptist, retired in [place]  . Smoking status: Never Smoker   . Smokeless tobacco: Never Used     Comment: Never smoked  . Alcohol Use: 0.0 oz/week    0 Standard drinks or equivalent per week     Comment: occasional margarita  . Drug Use: No  . Sexual Activity: Yes    Birth Control/ Protection: Surgical   Other Topics Concern  . Not on file   Social History Narrative    Review of Systems: See HPI, otherwise negative ROS  Physical Exam: BP 130/65 mmHg  Pulse 58  Temp(Src) 98.4 F (36.9 C) (Oral)  Ht 5' 5.5" (1.664 m)  Wt 188 lb 3.2 oz (85.367 kg)  BMI 30.83 kg/m2 General:   Alert,  Well-developed, well-nourished, pleasant and cooperative in NAD Neck:  Supple; no masses or thyromegaly. No significant cervical adenopathy. Lungs:  Clear throughout to auscultation.   No wheezes, crackles, or rhonchi. No acute distress. Heart:  Regular rate and rhythm; no murmurs, clicks, rubs,  or gallops. Abdomen: Non-distended, normal bowel sounds.  Soft and nontender without appreciable mass or hepatosplenomegaly.  Pulses:  Normal pulses noted. Extremities:  Without clubbing or edema.  Impression:    Pleasant 80 year old lady with long-standing GERD well-controlled on every other day pantoprazole 40 mg daily. History Hemoccult-positive stool last year with recent EGD and  TCS -up-to-date. Clinically, doing very well from a GI standpoint.  No further GI evaluation needed unless patient develops new symptoms.  Recommendations: May continue Pantoprazole 40 mg daily to every other day  Office visit in 1  year and as needed  Call if any interim problems arise  GERD information provided     Notice: This dictation was prepared with Dragon dictation along with smaller phrase technology. Any transcriptional errors that result from this process are unintentional and may not be corrected upon review.

## 2016-05-12 NOTE — Patient Instructions (Signed)
May continue Pantoprazole 40 mg daily to every other day  Office visit in 1 year and as needed  GERD information provided

## 2016-08-31 ENCOUNTER — Other Ambulatory Visit: Payer: Self-pay | Admitting: Family Medicine

## 2016-09-03 DIAGNOSIS — Z23 Encounter for immunization: Secondary | ICD-10-CM | POA: Diagnosis not present

## 2016-10-15 ENCOUNTER — Other Ambulatory Visit: Payer: Self-pay | Admitting: Family Medicine

## 2016-10-16 ENCOUNTER — Telehealth: Payer: Self-pay | Admitting: Family Medicine

## 2016-10-16 ENCOUNTER — Other Ambulatory Visit: Payer: Self-pay

## 2016-10-16 MED ORDER — ATENOLOL 50 MG PO TABS: 50.0000 mg | ORAL_TABLET | Freq: Every day | ORAL | 0 refills | Status: DC

## 2016-10-16 NOTE — Telephone Encounter (Signed)
Typically we use metoprolol which is a twice a day beta blocker. Or we can use metoprolol extended release which is once a day. Patient does have follow-up office visit on Monday if she would like to discuss further at that time or if she would like to list her preference now and we can send in rx

## 2016-10-16 NOTE — Telephone Encounter (Signed)
Pharmacist called from [facility]. Atenolol 50  is no longer available. Please advise?

## 2016-10-16 NOTE — Telephone Encounter (Signed)
Left message return call 10/16/16

## 2016-10-19 ENCOUNTER — Encounter: Payer: Self-pay | Admitting: Family Medicine

## 2016-10-19 ENCOUNTER — Ambulatory Visit (INDEPENDENT_AMBULATORY_CARE_PROVIDER_SITE_OTHER): Payer: Medicare Other | Admitting: Family Medicine

## 2016-10-19 VITALS — Ht 65.5 in | Wt 191.2 lb

## 2016-10-19 DIAGNOSIS — K219 Gastro-esophageal reflux disease without esophagitis: Secondary | ICD-10-CM

## 2016-10-19 DIAGNOSIS — I10 Essential (primary) hypertension: Secondary | ICD-10-CM

## 2016-10-19 DIAGNOSIS — E038 Other specified hypothyroidism: Secondary | ICD-10-CM

## 2016-10-19 MED ORDER — POTASSIUM CHLORIDE CRYS ER 10 MEQ PO TBCR: 10.0000 meq | EXTENDED_RELEASE_TABLET | Freq: Every day | ORAL | 3 refills | Status: DC

## 2016-10-19 MED ORDER — RANITIDINE HCL 300 MG PO TABS: 300.0000 mg | ORAL_TABLET | Freq: Every day | ORAL | 5 refills | Status: DC

## 2016-10-19 MED ORDER — METOPROLOL SUCCINATE ER 25 MG PO TB24
25.0000 mg | ORAL_TABLET | Freq: Every day | ORAL | 3 refills | Status: DC
Start: 2016-10-19 — End: 2017-10-05

## 2016-10-19 MED ORDER — LEVOTHYROXINE SODIUM 112 MCG PO TABS: 112.0000 ug | ORAL_TABLET | Freq: Every day | ORAL | 2 refills | Status: DC

## 2016-10-19 NOTE — Telephone Encounter (Signed)
Discussed with patient. Patient advised Typically we use metoprolol which is a twice a day beta blocker. Or we can use metoprolol extended release which is once a day. Patient does have appointment this am and will discuss further at office visit.

## 2016-10-19 NOTE — Progress Notes (Signed)
   Subjective:    Patient ID: [name], female    DOB: [birth year], 80 y.o.   MRN: [id number]  Hypertension  This is a chronic problem. The current episode started more than 1 year ago. Pertinent negatives include no chest pain, headaches or shortness of breath. Risk factors for coronary artery disease include post-menopausal state. Treatments tried: atenolol, lozol. There are no compliance problems.    Discuss options for atenolol which is no longer available This medication no longer being produced therefore not available therefore will need to change to a different beta blocker  Patient takes her thyroid medicine on a regular basis does need lab work  Her reflexes been under decent control but she has frequent loose stools wonders if this could be related to protonic's we will try different approach for her reflux  Review of Systems  Constitutional: Negative for activity change, fatigue and fever.  Respiratory: Negative for cough and shortness of breath.   Cardiovascular: Negative for chest pain and leg swelling.  Neurological: Negative for headaches.       Objective:   Physical Exam  Constitutional: She appears well-nourished. No distress.  Cardiovascular: Normal rate, regular rhythm and normal heart sounds.   No murmur heard. Pulmonary/Chest: Effort normal and breath sounds normal. No respiratory distress.  Musculoskeletal: She exhibits no edema.  Lymphadenopathy:    She has no cervical adenopathy.  Neurological: She is alert. She exhibits normal muscle tone.  Psychiatric: Her behavior is normal.  Vitals reviewed.         Assessment & Plan:  Hypertension decent control continue current measures, atenolol no longer produced. Therefore go with metoprolol 25 mg extended release   Reflux will try Zantac for 1 months E that doesn't better job of controlling her symptoms as well as less side effects patient currently having frequent loose stools with Protonix we will  see if Zantac does better  Hypothyroidism check TSH await the result continue medication

## 2016-10-20 ENCOUNTER — Encounter: Payer: Self-pay | Admitting: Family Medicine

## 2016-10-20 LAB — TSH: TSH: 2.19 u[IU]/mL (ref 0.450–4.500)

## 2016-11-03 ENCOUNTER — Other Ambulatory Visit: Payer: Self-pay | Admitting: Family Medicine

## 2016-11-04 NOTE — Telephone Encounter (Signed)
May refill this +3 additional refills 

## 2016-12-11 ENCOUNTER — Encounter (HOSPITAL_COMMUNITY): Payer: Self-pay | Admitting: Emergency Medicine

## 2016-12-11 ENCOUNTER — Ambulatory Visit (HOSPITAL_COMMUNITY)
Admission: EM | Admit: 2016-12-11 | Discharge: 2016-12-11 | Disposition: A | Discharge status: Home / Self Care | Payer: Medicare Other | Attending: Emergency Medicine | Admitting: Emergency Medicine

## 2016-12-11 ENCOUNTER — Ambulatory Visit (INDEPENDENT_AMBULATORY_CARE_PROVIDER_SITE_OTHER): Payer: Medicare Other

## 2016-12-11 DIAGNOSIS — M542 Cervicalgia: Secondary | ICD-10-CM

## 2016-12-11 MED ORDER — DICLOFENAC SODIUM 1 % TD GEL: 1.0000 "application " | Freq: Four times a day (QID) | TRANSDERMAL | 0 refills | Status: DC

## 2016-12-11 NOTE — ED Triage Notes (Signed)
Here for neck pain onset this am.... Reports it started on her back and then moved to the neck  Pain increases w/movement and getting worse  Taking Aleve w/no relief.   Denies inj/trauma, muscle weakness, blurred vision  Also c/o soreness of scalp/head onset 2 years   A&O x4... NAD

## 2016-12-11 NOTE — Discharge Instructions (Signed)
You may take 500 mg to 1 g of Tylenol up to 4 times a day as needed for pain. This with the Voltaren gel is an effective combination for pain. Try the Xanax as we discussed. Follow up with your doctor in several days. Try some deep tissue massage, and use ice or heat, whichever feels better Go to the ER for the signs and symptoms we discussed

## 2016-12-11 NOTE — ED Provider Notes (Signed)
HPI  SUBJECTIVE:  [name] is a 81 y.o. female who presents with throbbing, burning, constant bilateral neck pain starting last night. She states it radiates to the scalp/top of her head starting today. Patient states that it feels like a "crick in my neck". She tried 2 tabs of Tylenol and 2 tabs Aleve, ice, heat without improvement in symptoms. Symptoms are worse with neck extension and rotation. She is able to flex her neck. States that she was able to fully extend her neck yesterday. She denies recent viral symptoms, upper respiratory sx, flulike symptoms. No nausea, vomiting, fevers, trauma to her neck. States that she did not sleep on it improperly. She reports a posterior dull headache. No neck stiffness, photophobia, rash. No arm or leg weakness, numbness, tingling. No visual changes, chest pain, shortness of breath. No facial droop, slurred speech, discoordination. No sore throat. She has never had symptoms this severe before although she has had symptoms in this area with similar quality pain before. She does not work at a Teaching [employer], tablet. She denies looking down her phone frequently. She has had no change in her physical activity. She has a past medical history of hypertension, GERD, peptic ulcer disease/upper GI bleeding, cardiomyopathy. No history of diabetes, neck or back injury, MI, coronary artery disease, osteoporosis or cancer. She takes Xanax at night only. [id number] Wolfgang Phoenix, MD     Past Medical History:  Diagnosis Date  . Depression   . H/O hiatal hernia   . Hernia   . Hernia    right inguinal hernia  . Hypertension   . Hypothyroidism   . Leg swelling   . Osteitis pubis [facility]) July 2002  . Osteopenia May 2010  . Thyroid disease     Past Surgical History:  Procedure Laterality Date  . ABDOMINAL HYSTERECTOMY  03/1970  . APPENDECTOMY  03/1966  . BILATERAL SALPINGOOPHORECTOMY    . CATARACT EXTRACTION W/PHACO Right 09/25/2014   Procedure: CATARACT EXTRACTION PHACO  AND INTRAOCULAR LENS PLACEMENT (IOC);  Surgeon: Elta Guadeloupe T. Gershon Crane, MD;  Location: [facility];  Service: Ophthalmology;  Laterality: Right;  CDE:7.56  . CATARACT EXTRACTION W/PHACO Left 10/09/2014   Procedure: CATARACT EXTRACTION PHACO AND INTRAOCULAR LENS PLACEMENT LEFT EYE CDE=6.51;  Surgeon: Elta Guadeloupe T. Gershon Crane, MD;  Location: [facility];  Service: Ophthalmology;  Laterality: Left;  . CHOLECYSTECTOMY  1980  . COLONOSCOPY  1999  . COLONOSCOPY  March 2007   Repeat 10 years  . COLONOSCOPY  11/06/2003    Internal and external hemorrhoids/Two tiny rectal distal sigmoid polyps cold biopsied/Otherwise within normal limits to the cecum  . COLONOSCOPY N/A 08/29/2014   Dr. Gala Romney: Sessile polyp was found at the cecum status post cold snare polypectomy, tubular adenoma. No further surveillance unless clinical status changes  . ESOPHAGOGASTRODUODENOSCOPY  2007   Dr. Gala Romney: normal esophagus, small tom oderate sized hiatal hernia, Cameron lesions  . SKIN CANCER EXCISION Left 11 1 2016   left arm squamous cancer  . TUMOR REMOVAL  03/1968   ovarian    Family History  Problem Relation Age of Onset  . Dementia Mother   . Heart disease Father   . Cancer Father     throat  . Cancer Sister     breast  . Colon cancer Neg Hx     Social History  Substance Use Topics  . Smoking status: Never Smoker  . Smokeless tobacco: Never Used     Comment: Never smoked  . Alcohol use 0.0 oz/week  Comment: occasional margarita    No current facility-administered medications for this encounter.   Current Outpatient Prescriptions:  .  ALPRAZolam (XANAX) 0.5 MG tablet, TAKE 1 TABLET BY MOUTH TWICE DAILY AS NEEDED, Disp: 60 tablet, Rfl: 3 .  Calcium Carbonate-Vitamin D (CALTRATE 600+D PO), Take 1 tablet by mouth daily. , Disp: , Rfl:  .  Cholecalciferol (VITAMIN D3) 2000 UNITS TABS, Take 2 tablets by mouth daily. , Disp: , Rfl:  .  levothyroxine (SYNTHROID, LEVOTHROID) 112 MCG tablet, Take 1 tablet (112 mcg total) by mouth  daily., Disp: 90 tablet, Rfl: 2 .  metoprolol succinate (TOPROL-XL) 25 MG 24 hr tablet, Take 1 tablet (25 mg total) by mouth daily., Disp: 90 tablet, Rfl: 3 .  pantoprazole (PROTONIX) 40 MG tablet, Take 1 tablet (40 mg total) by mouth daily. (Patient taking differently: Take 40 mg by mouth every other day. ), Disp: 90 tablet, Rfl: 1 .  potassium chloride (KLOR-CON M10) 10 MEQ tablet, Take 1 tablet (10 mEq total) by mouth daily., Disp: 90 tablet, Rfl: 3 .  Probiotic Product (SOLUBLE FIBER/PROBIOTICS PO), Take by mouth. One capsule daily, Disp: , Rfl:  .  diclofenac sodium (VOLTAREN) 1 % GEL, Apply 1 application topically 4 (four) times daily. Apply 2 gm qid, Disp: 100 g, Rfl: 0 .  ranitidine (ZANTAC) 300 MG tablet, Take 1 tablet (300 mg total) by mouth at bedtime., Disp: 30 tablet, Rfl: 5  Allergies  Allergen Reactions  . Erythromycin Shortness Of Breath  . Aspirin Other (See Comments)    Caused internal bleeding. Patient had ulcer at the time. Instructed by PCP - Darral Dash not to take any longer.  . Ibuprofen     Instructed not to take by PCP - Darral Dash due to reaction to aspirin as well (precautionary).     ROS  As noted in HPI.   Physical Exam  BP 156/61 (BP Location: Left Arm)   Pulse 73   Temp 98.1 F (36.7 C) (Oral)   Resp 16   SpO2 99%   Constitutional: Well developed, well nourished, no acute distress Eyes: PERRL, EOMI, conjunctiva normal bilaterally and no photophobia. HENT: Normocephalic, atraumatic,mucus membranes moist Neck: Mild Bilateral trapezial tenderness, tight trapezius muscles, positive C-spine tenderness. No T-spine or L-spine tenderness. Pain with rotation to the right and left, pain with extension. Patient has difficulty fully extending neck. Patient able to flex neck. No meningismus. Respiratory: Normal inspiratory effort, lungs clear bilaterally Cardiovascular: Normal rate and rhythm, no murmurs, no gallops, no rubs GI: Nondistended skin: No rash, skin  intact Musculoskeletal: No edema, no tenderness, no deformities. No  Shoulder, arm tenderness bilaterally. Pt has full AROM b/l shoulders, elbows. Bilateral shoulder, elbow, grip strength 5/5 and equal. RP 2+. Neurologic: Alert & oriented x 3, CN II-XII grossly intact, no motor deficits, sensation grossly intact Psychiatric: Speech and behavior appropriate   ED Course   Medications - No data to display  Orders Placed This Encounter  Procedures  . DG Cervical Spine Complete    Standing Status:   Standing    Number of Occurrences:   1    Order Specific Question:   Reason for Exam (SYMPTOM  OR DIAGNOSIS REQUIRED)    Answer:   bilateral neck pain r/o acute changes   No results found for this or any previous visit (from the past 24 hour(s)). Dg Cervical Spine Complete  Result Date: 12/11/2016 CLINICAL DATA:  Acute onset of neck pain, radiating to the head. Initial encounter. EXAM: CERVICAL SPINE -  COMPLETE 4+ VIEW COMPARISON:  CT of the cervical spine performed 12/29/2005 FINDINGS: There is no evidence of fracture or subluxation. Vertebral bodies demonstrate normal height and alignment. Minimal disc space narrowing is noted at C5-C6. Small anterior and posterior disc osteophyte complexes are seen along the lower cervical spine. Prevertebral soft tissues are within normal limits. The provided odontoid view demonstrates no significant abnormality. The visualized lung apices are clear. IMPRESSION: No evidence of fracture or subluxation along the cervical spine. Electronically Signed   By: Garald Balding M.D.   On: 12/11/2016 18:15    ED Clinical Impression  Neck pain  ED Assessment/Plan  [facility] narcotic database reviewed. Patient with prescription of Xanax 0.5 from the same provider, last filled this on 11/04/2016  Imaging independently reviewed. Minimal disc space narrowing noted at C5, C6. No evidence of fracture or subluxation. Normal vertebral bodies. Small anterior posterior disc  osteophyte complexes are seen along the lower cervical spine. Prevertebral soft tissues normal. Odontoid view normal. See radiology report for details  Presentation is suggestive of a musculoskeletal cause of her neck pain. Doubt carotid dissection or meningitis. We'll get x-rays due to the midline C-spine tenderness to rule out any acute changes. Plan to send her Voltaren gel, hesitant to send home with muscle relaxants due to her age. She does have 0.5 mg Xanax that she can take it twice daily as needed. Advised her to try half a pill, if that does not work, then up at to a whole pill, she is not to take more than 2 a day. Tylenol 1 g up to 4 times a day as needed for pain. She will need to follow up with her primary care physician. To the ER if she gets worse or for any signs of stroke, meningitis.  Discussed imaging, MDM, plan and followup with patient. Discussed sn/sx that should prompt return to the ED. Patient agrees with plan.   Meds ordered this encounter  Medications  . diclofenac sodium (VOLTAREN) 1 % GEL    Sig: Apply 1 application topically 4 (four) times daily. Apply 2 gm qid    Dispense:  100 g    Refill:  0    *This clinic note was created using [employer]. Therefore, there may be occasional mistakes despite careful proofreading.  ?   Melynda Ripple, MD 12/11/16 2145

## 2016-12-15 ENCOUNTER — Ambulatory Visit (INDEPENDENT_AMBULATORY_CARE_PROVIDER_SITE_OTHER): Payer: Medicare Other | Admitting: Family Medicine

## 2016-12-15 VITALS — BP 136/88 | Wt 194.6 lb

## 2016-12-15 DIAGNOSIS — R519 Headache, unspecified: Secondary | ICD-10-CM

## 2016-12-15 DIAGNOSIS — R51 Headache: Secondary | ICD-10-CM | POA: Diagnosis not present

## 2016-12-15 NOTE — Progress Notes (Signed)
   Subjective:    Patient ID: [name], female    DOB: 11-28-1936, 81 y.o.   MRN: [id number]  HPI  Patient states she is in office today to follow up from recent UC visit.  She states she had severe headache.  She states she sat there for 4 hours and they did neck xray. She states she has improved since that visit. She denies symptoms of CVA/TIA.  Patient states her shoulders are still tender. The patient relates that she is having pain in the upper portion of her neck difficult time turning left and right she also relates that she was having a severe headache there is in the temporal region and the top of her head she went to urgent care center they did a cervical spine x-ray. They treated her with Tylenol and told Taran gel the center home she states it took 2 days for the headache go away no blurred vision or double vision denies nausea vomiting not wake her up through the night. Has not had frequent headaches.  Review of Systems Please see above    Objective:   Physical Exam Patient does not appear to be in any distress currently her lungs are clear hearts regular HEENT is benign extremities no edema neurologic grossly normal Patient relates some soreness in the upper shoulder muscles No tenderness in the temporal region     Assessment & Plan:  Severe headache Now has resolved We will check sedimentation rate I doubt polymyalgia rheumatica or temporal arteritis but this is a possibility  Patient was told if she starts having frequent headaches nausea vomiting she needs to immediately get checked she is in notify us if having ongoing troubles she is to keep her regular follow-up visit

## 2016-12-16 LAB — SEDIMENTATION RATE: Sed Rate: 9 mm/hr (ref 0–40)

## 2016-12-16 LAB — C-REACTIVE PROTEIN: CRP: 11 mg/L — ABNORMAL HIGH (ref 0.0–4.9)

## 2016-12-21 ENCOUNTER — Other Ambulatory Visit: Payer: Self-pay | Admitting: Family Medicine

## 2016-12-21 DIAGNOSIS — Z1231 Encounter for screening mammogram for malignant neoplasm of breast: Secondary | ICD-10-CM

## 2016-12-30 ENCOUNTER — Ambulatory Visit (HOSPITAL_COMMUNITY)
Admission: RE | Admit: 2016-12-30 | Discharge: 2016-12-30 | Disposition: A | Discharge status: Home / Self Care | Payer: Medicare Other | Source: Ambulatory Visit | Attending: Family Medicine | Admitting: Family Medicine

## 2016-12-30 DIAGNOSIS — Z1231 Encounter for screening mammogram for malignant neoplasm of breast: Secondary | ICD-10-CM | POA: Diagnosis present

## 2017-01-25 ENCOUNTER — Other Ambulatory Visit: Payer: Self-pay | Admitting: Family Medicine

## 2017-04-05 ENCOUNTER — Encounter: Payer: Self-pay | Admitting: Internal Medicine

## 2017-04-07 ENCOUNTER — Other Ambulatory Visit: Payer: Self-pay | Admitting: Family Medicine

## 2017-04-07 NOTE — Telephone Encounter (Signed)
It would be fine to give this +2 additional refills follow-up in the summer

## 2017-04-15 ENCOUNTER — Ambulatory Visit: Payer: Medicare Other | Admitting: Family Medicine

## 2017-04-23 ENCOUNTER — Other Ambulatory Visit: Payer: Self-pay | Admitting: Family Medicine

## 2017-04-28 ENCOUNTER — Ambulatory Visit (HOSPITAL_COMMUNITY): Payer: Medicare Other

## 2017-04-28 ENCOUNTER — Encounter: Payer: Self-pay | Admitting: Family Medicine

## 2017-04-28 ENCOUNTER — Ambulatory Visit (INDEPENDENT_AMBULATORY_CARE_PROVIDER_SITE_OTHER): Payer: Medicare Other | Admitting: Family Medicine

## 2017-04-28 VITALS — BP 138/84 | Ht 65.5 in | Wt 190.0 lb

## 2017-04-28 DIAGNOSIS — K219 Gastro-esophageal reflux disease without esophagitis: Secondary | ICD-10-CM | POA: Diagnosis not present

## 2017-04-28 DIAGNOSIS — Z780 Asymptomatic menopausal state: Secondary | ICD-10-CM | POA: Diagnosis not present

## 2017-04-28 DIAGNOSIS — E784 Other hyperlipidemia: Secondary | ICD-10-CM | POA: Diagnosis not present

## 2017-04-28 DIAGNOSIS — E038 Other specified hypothyroidism: Secondary | ICD-10-CM | POA: Diagnosis not present

## 2017-04-28 DIAGNOSIS — E7849 Other hyperlipidemia: Secondary | ICD-10-CM

## 2017-04-28 DIAGNOSIS — H6123 Impacted cerumen, bilateral: Secondary | ICD-10-CM | POA: Diagnosis not present

## 2017-04-28 DIAGNOSIS — I10 Essential (primary) hypertension: Secondary | ICD-10-CM

## 2017-04-28 MED ORDER — ALPRAZOLAM 0.5 MG PO TABS: 0.5000 mg | ORAL_TABLET | Freq: Two times a day (BID) | ORAL | 5 refills | Status: DC | PRN

## 2017-04-28 MED ORDER — LEVOTHYROXINE SODIUM 112 MCG PO TABS: 112.0000 ug | ORAL_TABLET | Freq: Every day | ORAL | 1 refills | Status: DC

## 2017-04-28 NOTE — Progress Notes (Signed)
   Subjective:    Patient ID: [name], female    DOB: 1936-09-17, 81 y.o.   MRN: [id number]  Hyperlipidemia  This is a chronic problem. The current episode started more than 1 year ago. Pertinent negatives include no chest pain or shortness of breath. Treatments tried: diet  There are no compliance problems (eats healthy and walks).   Blood pressure under good control takes her medicine watch his salt Hypothyroidism takes her medicine as directed labs reviewed Hyperlipidemia previous labs reviewed Patient due for bone density Patient M difficult time hearing Patient does take her blood pressure medicine on a regular basis Patient does use Xanax intermittently for stressed denies abusing it does not cause drowsiness Ear wax.    Review of Systems  Constitutional: Negative for activity change, appetite change and fatigue.  HENT: Negative for congestion.   Respiratory: Negative for cough and shortness of breath.   Cardiovascular: Negative for chest pain.  Endocrine: Negative for polydipsia and polyphagia.  Genitourinary: Negative for frequency.  Neurological: Negative for weakness.  Psychiatric/Behavioral: Negative for confusion.       Objective:   Physical Exam  Constitutional: She appears well-nourished. No distress.  Cardiovascular: Normal rate, regular rhythm and normal heart sounds.   No murmur heard. Pulmonary/Chest: Effort normal and breath sounds normal. No respiratory distress.  Musculoskeletal: She exhibits no edema.  Lymphadenopathy:    She has no cervical adenopathy.  Neurological: She is alert. She exhibits normal muscle tone.  Psychiatric: Her behavior is normal.  Vitals reviewed.         Assessment & Plan:  HTN-good control continue current measures check metabolic 7  Screening lipid  Reflux good control continue current measures  Hypothyroidism continue medication check TSH  Osteopenia check bone density  Generalized anxiety Xanax refills  given  Cerumen impaction referral to Dr. Benjamine Mola  Follow-up 6 months

## 2017-04-29 ENCOUNTER — Encounter: Payer: Self-pay | Admitting: Family Medicine

## 2017-05-03 DIAGNOSIS — E038 Other specified hypothyroidism: Secondary | ICD-10-CM | POA: Diagnosis not present

## 2017-05-03 DIAGNOSIS — I10 Essential (primary) hypertension: Secondary | ICD-10-CM | POA: Diagnosis not present

## 2017-05-03 DIAGNOSIS — E784 Other hyperlipidemia: Secondary | ICD-10-CM | POA: Diagnosis not present

## 2017-05-04 LAB — BASIC METABOLIC PANEL
BUN / CREAT RATIO: 12 (ref 12–28)
BUN: 11 mg/dL (ref 8–27)
CHLORIDE: 100 mmol/L (ref 96–106)
CO2: 31 mmol/L — ABNORMAL HIGH (ref 18–29)
Calcium: 9 mg/dL (ref 8.7–10.3)
Creatinine, Ser: 0.89 mg/dL (ref 0.57–1.00)
GFR calc Af Amer: 71 mL/min/{1.73_m2} (ref >59)
GFR calc non Af Amer: 62 mL/min/{1.73_m2} (ref >59)
GLUCOSE: 102 mg/dL — AB (ref 65–99)
Potassium: 3.6 mmol/L (ref 3.5–5.2)
SODIUM: 144 mmol/L (ref 134–144)

## 2017-05-04 LAB — LIPID PANEL
CHOL/HDL RATIO: 2.6 ratio (ref 0.0–4.4)
Cholesterol, Total: 153 mg/dL (ref 100–199)
HDL: 60 mg/dL (ref >39)
LDL CALC: 71 mg/dL (ref 0–99)
Triglycerides: 110 mg/dL (ref 0–149)
VLDL CHOLESTEROL CAL: 22 mg/dL (ref 5–40)

## 2017-05-04 LAB — TSH: TSH: 1.07 u[IU]/mL (ref 0.450–4.500)

## 2017-05-11 DIAGNOSIS — L570 Actinic keratosis: Secondary | ICD-10-CM | POA: Diagnosis not present

## 2017-05-12 ENCOUNTER — Ambulatory Visit (HOSPITAL_COMMUNITY)
Admission: RE | Admit: 2017-05-12 | Discharge: 2017-05-12 | Disposition: A | Discharge status: Home / Self Care | Payer: Medicare Other | Source: Ambulatory Visit | Attending: Family Medicine | Admitting: Family Medicine

## 2017-05-12 ENCOUNTER — Encounter: Payer: Self-pay | Admitting: Family Medicine

## 2017-05-12 DIAGNOSIS — Z780 Asymptomatic menopausal state: Secondary | ICD-10-CM | POA: Diagnosis not present

## 2017-05-12 DIAGNOSIS — M85852 Other specified disorders of bone density and structure, left thigh: Secondary | ICD-10-CM | POA: Insufficient documentation

## 2017-05-27 ENCOUNTER — Ambulatory Visit (INDEPENDENT_AMBULATORY_CARE_PROVIDER_SITE_OTHER): Payer: Medicare Other | Admitting: Otolaryngology

## 2017-05-27 DIAGNOSIS — H903 Sensorineural hearing loss, bilateral: Secondary | ICD-10-CM | POA: Diagnosis not present

## 2017-05-27 DIAGNOSIS — H6123 Impacted cerumen, bilateral: Secondary | ICD-10-CM

## 2017-07-21 ENCOUNTER — Other Ambulatory Visit: Payer: Self-pay | Admitting: Family Medicine

## 2017-08-11 ENCOUNTER — Other Ambulatory Visit: Payer: Self-pay | Admitting: Family Medicine

## 2017-09-13 DIAGNOSIS — Z23 Encounter for immunization: Secondary | ICD-10-CM | POA: Diagnosis not present

## 2017-10-05 ENCOUNTER — Ambulatory Visit (INDEPENDENT_AMBULATORY_CARE_PROVIDER_SITE_OTHER): Payer: Medicare Other | Admitting: Family Medicine

## 2017-10-05 ENCOUNTER — Encounter: Payer: Self-pay | Admitting: Family Medicine

## 2017-10-05 VITALS — BP 124/76 | Ht 66.5 in | Wt 187.6 lb

## 2017-10-05 DIAGNOSIS — G542 Cervical root disorders, not elsewhere classified: Secondary | ICD-10-CM | POA: Diagnosis not present

## 2017-10-05 DIAGNOSIS — E038 Other specified hypothyroidism: Secondary | ICD-10-CM | POA: Diagnosis not present

## 2017-10-05 DIAGNOSIS — I10 Essential (primary) hypertension: Secondary | ICD-10-CM

## 2017-10-05 DIAGNOSIS — Z23 Encounter for immunization: Secondary | ICD-10-CM

## 2017-10-05 DIAGNOSIS — R8290 Unspecified abnormal findings in urine: Secondary | ICD-10-CM

## 2017-10-05 LAB — POCT URINALYSIS DIPSTICK
SPEC GRAV UA: 1.015 (ref 1.010–1.025)
pH, UA: 6 (ref 5.0–8.0)

## 2017-10-05 MED ORDER — METOPROLOL SUCCINATE ER 25 MG PO TB24: 25.0000 mg | ORAL_TABLET | Freq: Every day | ORAL | 3 refills | Status: DC

## 2017-10-05 MED ORDER — LEVOTHYROXINE SODIUM 112 MCG PO TABS: 112.0000 ug | ORAL_TABLET | Freq: Every day | ORAL | 1 refills | Status: DC

## 2017-10-05 MED ORDER — POTASSIUM CHLORIDE CRYS ER 10 MEQ PO TBCR: 10.0000 meq | EXTENDED_RELEASE_TABLET | Freq: Every day | ORAL | 3 refills | Status: DC

## 2017-10-05 MED ORDER — ALPRAZOLAM 0.5 MG PO TABS: 0.5000 mg | ORAL_TABLET | Freq: Two times a day (BID) | ORAL | 5 refills | Status: DC | PRN

## 2017-10-05 MED ORDER — PANTOPRAZOLE SODIUM 40 MG PO TBEC: DELAYED_RELEASE_TABLET | ORAL | 0 refills | Status: DC

## 2017-10-05 NOTE — Progress Notes (Signed)
   Subjective:    Patient ID: [name], female    DOB: [birth year], 81 y.o.   MRN: [id number]  Hypertension  This is a chronic problem. The current episode started more than 1 year ago. Pertinent negatives include no chest pain, headaches or shortness of breath. Risk factors for coronary artery disease include post-menopausal state. Treatments tried: lozol, toprol. There are no compliance problems.    Urine seems cloudy at times- no burning or pain She denies abdominal pain fever chills sweats  She does states she takes her thyroid medicine keeps under good control has history of thyroid  Has a history of reflux is under good control with Protonix she does try to watch her scans  She does suffer with obesity she knows she needs to cut back on diet and stay active  She also relates some intermittent sharp pains in the back of her head that radiate up into the back of the occiput does not last long only a few seconds at a time is not frequent probable impingement of the nerve or possible occipital nerve inflammation  Review of Systems  Constitutional: Negative for activity change, fatigue and fever.  HENT: Negative for congestion.   Respiratory: Negative for cough, chest tightness and shortness of breath.   Cardiovascular: Negative for chest pain and leg swelling.  Gastrointestinal: Negative for abdominal pain.  Skin: Negative for color change.  Neurological: Negative for headaches.  Psychiatric/Behavioral: Negative for behavioral problems.       Objective:   Physical Exam  Constitutional: She appears well-developed and well-nourished. No distress.  HENT:  Head: Normocephalic and atraumatic.  Eyes: Right eye exhibits no discharge. Left eye exhibits no discharge.  Neck: No tracheal deviation present.  Cardiovascular: Normal rate, regular rhythm and normal heart sounds.  No murmur heard. Pulmonary/Chest: Effort normal and breath sounds normal. No respiratory distress. She has  no wheezes. She has no rales.  Musculoskeletal: She exhibits no edema.  Lymphadenopathy:    She has no cervical adenopathy.  Neurological: She is alert. She exhibits normal muscle tone.  Skin: Skin is warm and dry. No erythema.  Psychiatric: Her behavior is normal.  Vitals reviewed.   25 minutes was spent with the patient. Greater than half the time was spent in discussion and answering questions and counseling regarding the issues that the patient came in for today.      Assessment & Plan:  Urine does have WBCs we will send for culture await the results  Blood pressure good control continue current measures watch diet take medication  Thyroid under good control continue medication watch diet  Moderate obesity patient encouraged to watch diet minimize portions stay physically active try to lose weight  Reflux issues she states she needs to take Protonix on a regular basis to keep this under control therefore she may continue  Does have some stress related issues uses Xanax occasionally   patient will follow-up for wellness exam in the spring along with comprehensive lab work  Patient defers her breast exam today  If sharp pains get worse with her head she needs to follow-up

## 2017-10-06 ENCOUNTER — Ambulatory Visit: Payer: Medicare Other | Admitting: Family Medicine

## 2017-10-07 LAB — URINE CULTURE

## 2017-10-08 MED ORDER — CEFPROZIL 500 MG PO TABS: 500.0000 mg | ORAL_TABLET | Freq: Two times a day (BID) | ORAL | 0 refills | Status: DC

## 2017-11-13 ENCOUNTER — Other Ambulatory Visit: Payer: Self-pay | Admitting: Family Medicine

## 2017-11-15 ENCOUNTER — Other Ambulatory Visit: Payer: Self-pay | Admitting: *Deleted

## 2017-11-15 MED ORDER — INDAPAMIDE 2.5 MG PO TABS: ORAL_TABLET | ORAL | 0 refills | Status: DC

## 2017-11-29 ENCOUNTER — Other Ambulatory Visit: Payer: Self-pay | Admitting: Family Medicine

## 2018-01-17 ENCOUNTER — Other Ambulatory Visit: Payer: Self-pay | Admitting: Family Medicine

## 2018-01-20 ENCOUNTER — Other Ambulatory Visit: Payer: Self-pay | Admitting: Family Medicine

## 2018-01-20 DIAGNOSIS — Z1231 Encounter for screening mammogram for malignant neoplasm of breast: Secondary | ICD-10-CM

## 2018-01-24 ENCOUNTER — Ambulatory Visit (HOSPITAL_COMMUNITY)
Admission: RE | Admit: 2018-01-24 | Discharge: 2018-01-24 | Disposition: A | Discharge status: Home / Self Care | Payer: PPO | Source: Ambulatory Visit | Attending: Family Medicine | Admitting: Family Medicine

## 2018-01-24 ENCOUNTER — Encounter (HOSPITAL_COMMUNITY): Payer: Self-pay

## 2018-01-24 DIAGNOSIS — Z1231 Encounter for screening mammogram for malignant neoplasm of breast: Secondary | ICD-10-CM | POA: Diagnosis not present

## 2018-01-28 DIAGNOSIS — M5441 Lumbago with sciatica, right side: Secondary | ICD-10-CM | POA: Diagnosis not present

## 2018-01-28 DIAGNOSIS — M9902 Segmental and somatic dysfunction of thoracic region: Secondary | ICD-10-CM | POA: Diagnosis not present

## 2018-01-28 DIAGNOSIS — M9905 Segmental and somatic dysfunction of pelvic region: Secondary | ICD-10-CM | POA: Diagnosis not present

## 2018-01-28 DIAGNOSIS — M546 Pain in thoracic spine: Secondary | ICD-10-CM | POA: Diagnosis not present

## 2018-01-28 DIAGNOSIS — M9903 Segmental and somatic dysfunction of lumbar region: Secondary | ICD-10-CM | POA: Diagnosis not present

## 2018-01-31 DIAGNOSIS — M9905 Segmental and somatic dysfunction of pelvic region: Secondary | ICD-10-CM | POA: Diagnosis not present

## 2018-01-31 DIAGNOSIS — M5441 Lumbago with sciatica, right side: Secondary | ICD-10-CM | POA: Diagnosis not present

## 2018-01-31 DIAGNOSIS — M9903 Segmental and somatic dysfunction of lumbar region: Secondary | ICD-10-CM | POA: Diagnosis not present

## 2018-01-31 DIAGNOSIS — M546 Pain in thoracic spine: Secondary | ICD-10-CM | POA: Diagnosis not present

## 2018-01-31 DIAGNOSIS — M9902 Segmental and somatic dysfunction of thoracic region: Secondary | ICD-10-CM | POA: Diagnosis not present

## 2018-02-02 DIAGNOSIS — M9905 Segmental and somatic dysfunction of pelvic region: Secondary | ICD-10-CM | POA: Diagnosis not present

## 2018-02-02 DIAGNOSIS — M546 Pain in thoracic spine: Secondary | ICD-10-CM | POA: Diagnosis not present

## 2018-02-02 DIAGNOSIS — M9902 Segmental and somatic dysfunction of thoracic region: Secondary | ICD-10-CM | POA: Diagnosis not present

## 2018-02-02 DIAGNOSIS — M9903 Segmental and somatic dysfunction of lumbar region: Secondary | ICD-10-CM | POA: Diagnosis not present

## 2018-02-02 DIAGNOSIS — M5441 Lumbago with sciatica, right side: Secondary | ICD-10-CM | POA: Diagnosis not present

## 2018-02-03 DIAGNOSIS — M9905 Segmental and somatic dysfunction of pelvic region: Secondary | ICD-10-CM | POA: Diagnosis not present

## 2018-02-03 DIAGNOSIS — M5441 Lumbago with sciatica, right side: Secondary | ICD-10-CM | POA: Diagnosis not present

## 2018-02-03 DIAGNOSIS — M546 Pain in thoracic spine: Secondary | ICD-10-CM | POA: Diagnosis not present

## 2018-02-03 DIAGNOSIS — M9902 Segmental and somatic dysfunction of thoracic region: Secondary | ICD-10-CM | POA: Diagnosis not present

## 2018-02-03 DIAGNOSIS — M9903 Segmental and somatic dysfunction of lumbar region: Secondary | ICD-10-CM | POA: Diagnosis not present

## 2018-02-07 DIAGNOSIS — M9903 Segmental and somatic dysfunction of lumbar region: Secondary | ICD-10-CM | POA: Diagnosis not present

## 2018-02-07 DIAGNOSIS — M9902 Segmental and somatic dysfunction of thoracic region: Secondary | ICD-10-CM | POA: Diagnosis not present

## 2018-02-07 DIAGNOSIS — M9905 Segmental and somatic dysfunction of pelvic region: Secondary | ICD-10-CM | POA: Diagnosis not present

## 2018-02-07 DIAGNOSIS — M5441 Lumbago with sciatica, right side: Secondary | ICD-10-CM | POA: Diagnosis not present

## 2018-02-07 DIAGNOSIS — M546 Pain in thoracic spine: Secondary | ICD-10-CM | POA: Diagnosis not present

## 2018-02-08 ENCOUNTER — Other Ambulatory Visit: Payer: Self-pay | Admitting: Family Medicine

## 2018-02-09 DIAGNOSIS — M9905 Segmental and somatic dysfunction of pelvic region: Secondary | ICD-10-CM | POA: Diagnosis not present

## 2018-02-09 DIAGNOSIS — M5441 Lumbago with sciatica, right side: Secondary | ICD-10-CM | POA: Diagnosis not present

## 2018-02-09 DIAGNOSIS — M9902 Segmental and somatic dysfunction of thoracic region: Secondary | ICD-10-CM | POA: Diagnosis not present

## 2018-02-09 DIAGNOSIS — M546 Pain in thoracic spine: Secondary | ICD-10-CM | POA: Diagnosis not present

## 2018-02-09 DIAGNOSIS — M9903 Segmental and somatic dysfunction of lumbar region: Secondary | ICD-10-CM | POA: Diagnosis not present

## 2018-02-10 DIAGNOSIS — M9902 Segmental and somatic dysfunction of thoracic region: Secondary | ICD-10-CM | POA: Diagnosis not present

## 2018-02-10 DIAGNOSIS — M9903 Segmental and somatic dysfunction of lumbar region: Secondary | ICD-10-CM | POA: Diagnosis not present

## 2018-02-10 DIAGNOSIS — M5441 Lumbago with sciatica, right side: Secondary | ICD-10-CM | POA: Diagnosis not present

## 2018-02-10 DIAGNOSIS — M546 Pain in thoracic spine: Secondary | ICD-10-CM | POA: Diagnosis not present

## 2018-02-10 DIAGNOSIS — M9905 Segmental and somatic dysfunction of pelvic region: Secondary | ICD-10-CM | POA: Diagnosis not present

## 2018-02-11 ENCOUNTER — Other Ambulatory Visit: Payer: Self-pay | Admitting: Family Medicine

## 2018-02-14 ENCOUNTER — Other Ambulatory Visit: Payer: Self-pay | Admitting: Family Medicine

## 2018-03-31 ENCOUNTER — Ambulatory Visit (INDEPENDENT_AMBULATORY_CARE_PROVIDER_SITE_OTHER): Payer: PPO | Admitting: Family Medicine

## 2018-03-31 ENCOUNTER — Encounter: Payer: Self-pay | Admitting: Family Medicine

## 2018-03-31 VITALS — BP 138/78 | Ht 66.5 in | Wt 193.0 lb

## 2018-03-31 DIAGNOSIS — Z0000 Encounter for general adult medical examination without abnormal findings: Secondary | ICD-10-CM

## 2018-03-31 DIAGNOSIS — R7989 Other specified abnormal findings of blood chemistry: Secondary | ICD-10-CM

## 2018-03-31 DIAGNOSIS — Z0001 Encounter for general adult medical examination with abnormal findings: Secondary | ICD-10-CM

## 2018-03-31 DIAGNOSIS — E038 Other specified hypothyroidism: Secondary | ICD-10-CM

## 2018-03-31 DIAGNOSIS — I10 Essential (primary) hypertension: Secondary | ICD-10-CM

## 2018-03-31 DIAGNOSIS — F321 Major depressive disorder, single episode, moderate: Secondary | ICD-10-CM

## 2018-03-31 DIAGNOSIS — E7849 Other hyperlipidemia: Secondary | ICD-10-CM

## 2018-03-31 MED ORDER — ALPRAZOLAM 0.5 MG PO TABS: 0.5000 mg | ORAL_TABLET | Freq: Two times a day (BID) | ORAL | 5 refills | Status: DC | PRN

## 2018-03-31 MED ORDER — PANTOPRAZOLE SODIUM 40 MG PO TBEC: DELAYED_RELEASE_TABLET | ORAL | 1 refills | Status: DC

## 2018-03-31 MED ORDER — SERTRALINE HCL 50 MG PO TABS: 50.0000 mg | ORAL_TABLET | Freq: Every day | ORAL | 3 refills | Status: DC

## 2018-03-31 MED ORDER — LEVOTHYROXINE SODIUM 112 MCG PO TABS: ORAL_TABLET | ORAL | 1 refills | Status: DC

## 2018-03-31 MED ORDER — INDAPAMIDE 2.5 MG PO TABS: ORAL_TABLET | ORAL | 1 refills | Status: DC

## 2018-03-31 MED ORDER — METOPROLOL SUCCINATE ER 25 MG PO TB24: 25.0000 mg | ORAL_TABLET | Freq: Every day | ORAL | 1 refills | Status: DC

## 2018-03-31 MED ORDER — POTASSIUM CHLORIDE CRYS ER 10 MEQ PO TBCR: 10.0000 meq | EXTENDED_RELEASE_TABLET | Freq: Every day | ORAL | 1 refills | Status: DC

## 2018-03-31 NOTE — Progress Notes (Signed)
Subjective:    Patient ID: [name], female    DOB: 06/08/1936, 82 y.o.   MRN: [id number]  HPI    PATIENT IS UNDRESSED AND WAITING IN THE ROOM!  AWV- Annual Wellness Visit  The patient was seen for their annual wellness visit. The patient's past medical history, surgical history, and family history were reviewed. Pertinent vaccines were reviewed ( tetanus, pneumonia, shingles, flu) The patient's medication list was reviewed and updated.  The height and weight were entered.  BMI recorded in electronic record elsewhere  Cognitive screening was completed. Outcome of Mini - Cog: Can not tell me where 9:15 is on the clock face.   Falls /depression screening electronically recorded within record elsewhere  Yes she fell about two weeks ago and bruised her arm. She was given fall risk/prevention information.  Current tobacco usage:No (All patients who use tobacco were given written and verbal information on quitting)  Recent listing of emergency department/hospitalizations over the past year were reviewed.  current specialist the patient sees on a regular basis: Dr.Rourk   Medicare annual wellness visit patient questionnaire was reviewed.  A written screening schedule for the patient for the next 5-10 years was given. Appropriate discussion of followup regarding next visit was discussed.  Patient has thyroid condition.  Takes thyroid medication on a regular basis.  States that the proper way.  Relates compliance.  States no negative side effects.  States condition seems to be under good control.  Patient here for follow-up regarding cholesterol.  Patient does try to maintain a reasonable diet.  Patient does take the medication on a regular basis.  Denies missing a dose.  The patient denies any obvious side effects.  Prior blood work results reviewed with the patient.  The patient is aware of his cholesterol goals and the need to keep it under good control to lessen the risk of  disease.  Patient for blood pressure check up. Patient relates compliance with meds. Todays BP reviewed with the patient. Patient denies issues with medication. Patient relates reasonable diet. Patient tries to minimize salt. Patient aware of BP goals.     Review of Systems  Constitutional: Negative for activity change, appetite change and fatigue.  HENT: Negative for congestion and rhinorrhea.   Eyes: Negative for discharge.  Respiratory: Negative for cough, chest tightness and wheezing.   Cardiovascular: Negative for chest pain.  Gastrointestinal: Negative for abdominal pain, blood in stool and vomiting.  Endocrine: Negative for polyphagia.  Genitourinary: Negative for difficulty urinating and frequency.  Musculoskeletal: Negative for neck pain.  Skin: Negative for color change.  Allergic/Immunologic: Negative for environmental allergies and food allergies.  Neurological: Negative for weakness and headaches.  Psychiatric/Behavioral: Negative for agitation and behavioral problems.       Objective:   Physical Exam  Constitutional: She is oriented to person, place, and time. She appears well-developed and well-nourished.  HENT:  Head: Normocephalic.  Right Ear: External ear normal.  Left Ear: External ear normal.  Eyes: Pupils are equal, round, and reactive to light.  Neck: Normal range of motion. No thyromegaly present.  Cardiovascular: Normal rate, regular rhythm, normal heart sounds and intact distal pulses.  No murmur heard. Pulmonary/Chest: Effort normal and breath sounds normal. No respiratory distress. She has no wheezes.  Abdominal: Soft. Bowel sounds are normal. She exhibits no distension and no mass. There is no tenderness.  Musculoskeletal: Normal range of motion. She exhibits no edema or tenderness.  Lymphadenopathy:    She has no  cervical adenopathy.  Neurological: She is alert and oriented to person, place, and time. She exhibits normal muscle tone.  Skin: Skin  is warm and dry.  Psychiatric: She has a normal mood and affect. Her behavior is normal.   Breast exam normal Pelvic exam normal Pap smear not indicated       Assessment & Plan:  Adult wellness-complete.wellness physical was conducted today. Importance of diet and exercise were discussed in detail.  In addition to this a discussion regarding safety was also covered. We also reviewed over immunizations and gave recommendations regarding current immunization needed for age.  In addition to this additional areas were also touched on including: Preventative health exams needed: Colonoscopy not indicated currently  Patient was advised yearly wellness exam thyroid good control continue current medication check lab work await results previous labs reviewed  Hyperlipidemia previous labs reviewed Labs ordered  Watch diet closely  History of elevated platelet count recheck CBC previous labs reviewed  Hypertension blood pressure good control continue current measures  Major depression moderate recommend Zoloft low-dose follow-up in 6 weeks warning signs discussed patient not suicidal

## 2018-03-31 NOTE — Patient Instructions (Signed)
DASH Eating Plan DASH stands for "Dietary Approaches to Stop Hypertension." The DASH eating plan is a healthy eating plan that has been shown to reduce high blood pressure (hypertension). It may also reduce your risk for type 2 diabetes, heart disease, and stroke. The DASH eating plan may also help with weight loss. What are tips for following this plan? General guidelines  Avoid eating more than 2,300 mg (milligrams) of salt (sodium) a day. If you have hypertension, you may need to reduce your sodium intake to 1,500 mg a day.  Limit alcohol intake to no more than 1 drink a day for nonpregnant women and 2 drinks a day for men. One drink equals 12 oz of beer, 5 oz of wine, or 1 oz of hard liquor.  Work with your health care provider to maintain a healthy body weight or to lose weight. Ask what an ideal weight is for you.  Get at least 30 minutes of exercise that causes your heart to beat faster (aerobic exercise) most days of the week. Activities may include walking, swimming, or biking.  Work with your health care provider or diet and nutrition specialist (dietitian) to adjust your eating plan to your individual calorie needs. Reading food labels  Check food labels for the amount of sodium per serving. Choose foods with less than 5 percent of the Daily Value of sodium. Generally, foods with less than 300 mg of sodium per serving fit into this eating plan.  To find whole grains, look for the word "whole" as the first word in the ingredient list. Shopping  Buy products labeled as "low-sodium" or "no salt added."  Buy fresh foods. Avoid canned foods and premade or frozen meals. Cooking  Avoid adding salt when cooking. Use salt-free seasonings or herbs instead of table salt or sea salt. Check with your health care provider or pharmacist before using salt substitutes.  Do not fry foods. Cook foods using healthy methods such as baking, boiling, grilling, and broiling instead.  Cook with  heart-healthy oils, such as olive, canola, soybean, or sunflower oil. Meal planning   Eat a balanced diet that includes: ? 5 or more servings of fruits and vegetables each day. At each meal, try to fill half of your plate with fruits and vegetables. ? Up to 6-8 servings of whole grains each day. ? Less than 6 oz of lean meat, poultry, or fish each day. A 3-oz serving of meat is about the same size as a deck of cards. One egg equals 1 oz. ? 2 servings of low-fat dairy each day. ? A serving of nuts, seeds, or beans 5 times each week. ? Heart-healthy fats. Healthy fats called Omega-3 fatty acids are found in foods such as flaxseeds and coldwater fish, like sardines, salmon, and mackerel.  Limit how much you eat of the following: ? Canned or prepackaged foods. ? Food that is high in trans fat, such as fried foods. ? Food that is high in saturated fat, such as fatty meat. ? Sweets, desserts, sugary drinks, and other foods with added sugar. ? Full-fat dairy products.  Do not salt foods before eating.  Try to eat at least 2 vegetarian meals each week.  Eat more home-cooked food and less restaurant, buffet, and fast food.  When eating at a restaurant, ask that your food be prepared with less salt or no salt, if possible. What foods are recommended? The items listed may not be a complete list. Talk with your dietitian about what   dietary choices are best for you. Grains Whole-grain or whole-wheat bread. Whole-grain or whole-wheat pasta. Brown rice. Oatmeal. Quinoa. Bulgur. Whole-grain and low-sodium cereals. Pita bread. Low-fat, low-sodium crackers. Whole-wheat flour tortillas. Vegetables Fresh or frozen vegetables (raw, steamed, roasted, or grilled). Low-sodium or reduced-sodium tomato and vegetable juice. Low-sodium or reduced-sodium tomato sauce and tomato paste. Low-sodium or reduced-sodium canned vegetables. Fruits All fresh, dried, or frozen fruit. Canned fruit in natural juice (without  added sugar). Meat and other protein foods Skinless chicken or turkey. Ground chicken or turkey. Pork with fat trimmed off. Fish and seafood. Egg whites. Dried beans, peas, or lentils. Unsalted nuts, nut butters, and seeds. Unsalted canned beans. Lean cuts of beef with fat trimmed off. Low-sodium, lean deli meat. Dairy Low-fat (1%) or fat-free (skim) milk. Fat-free, low-fat, or reduced-fat cheeses. Nonfat, low-sodium ricotta or cottage cheese. Low-fat or nonfat yogurt. Low-fat, low-sodium cheese. Fats and oils Soft margarine without trans fats. Vegetable oil. Low-fat, reduced-fat, or light mayonnaise and salad dressings (reduced-sodium). Canola, safflower, olive, soybean, and sunflower oils. Avocado. Seasoning and other foods Herbs. Spices. Seasoning mixes without salt. Unsalted popcorn and pretzels. Fat-free sweets. What foods are not recommended? The items listed may not be a complete list. Talk with your dietitian about what dietary choices are best for you. Grains Baked goods made with fat, such as croissants, muffins, or some breads. Dry pasta or rice meal packs. Vegetables Creamed or fried vegetables. Vegetables in a cheese sauce. Regular canned vegetables (not low-sodium or reduced-sodium). Regular canned tomato sauce and paste (not low-sodium or reduced-sodium). Regular tomato and vegetable juice (not low-sodium or reduced-sodium). Pickles. Olives. Fruits Canned fruit in a light or heavy syrup. Fried fruit. Fruit in cream or butter sauce. Meat and other protein foods Fatty cuts of meat. Ribs. Fried meat. Bacon. Sausage. Bologna and other processed lunch meats. Salami. Fatback. Hotdogs. Bratwurst. Salted nuts and seeds. Canned beans with added salt. Canned or smoked fish. Whole eggs or egg yolks. Chicken or turkey with skin. Dairy Whole or 2% milk, cream, and half-and-half. Whole or full-fat cream cheese. Whole-fat or sweetened yogurt. Full-fat cheese. Nondairy creamers. Whipped toppings.  Processed cheese and cheese spreads. Fats and oils Butter. Stick margarine. Lard. Shortening. Ghee. Bacon fat. Tropical oils, such as coconut, palm kernel, or palm oil. Seasoning and other foods Salted popcorn and pretzels. Onion salt, garlic salt, seasoned salt, table salt, and sea salt. Worcestershire sauce. Tartar sauce. Barbecue sauce. Teriyaki sauce. Soy sauce, including reduced-sodium. Steak sauce. Canned and packaged gravies. Fish sauce. Oyster sauce. Cocktail sauce. Horseradish that you find on the shelf. Ketchup. Mustard. Meat flavorings and tenderizers. Bouillon cubes. Hot sauce and Tabasco sauce. Premade or packaged marinades. Premade or packaged taco seasonings. Relishes. Regular salad dressings. Where to find more information:  National Heart, Lung, and Blood Institute: www.nhlbi.nih.gov  American Heart Association: www.heart.org Summary  The DASH eating plan is a healthy eating plan that has been shown to reduce high blood pressure (hypertension). It may also reduce your risk for type 2 diabetes, heart disease, and stroke.  With the DASH eating plan, you should limit salt (sodium) intake to 2,300 mg a day. If you have hypertension, you may need to reduce your sodium intake to 1,500 mg a day.  When on the DASH eating plan, aim to eat more fresh fruits and vegetables, whole grains, lean proteins, low-fat dairy, and heart-healthy fats.  Work with your health care provider or diet and nutrition specialist (dietitian) to adjust your eating plan to your individual   calorie needs. This information is not intended to replace advice given to you by your health care provider. Make sure you discuss any questions you have with your health care provider. Document Released: 11/05/2011 Document Revised: 11/09/2016 Document Reviewed: 11/09/2016 Elsevier Interactive Patient Education  2018 [employer]  

## 2018-04-04 DIAGNOSIS — E038 Other specified hypothyroidism: Secondary | ICD-10-CM | POA: Diagnosis not present

## 2018-04-04 DIAGNOSIS — Z0000 Encounter for general adult medical examination without abnormal findings: Secondary | ICD-10-CM | POA: Diagnosis not present

## 2018-04-04 DIAGNOSIS — E7849 Other hyperlipidemia: Secondary | ICD-10-CM | POA: Diagnosis not present

## 2018-04-04 DIAGNOSIS — R7989 Other specified abnormal findings of blood chemistry: Secondary | ICD-10-CM | POA: Diagnosis not present

## 2018-04-04 DIAGNOSIS — I10 Essential (primary) hypertension: Secondary | ICD-10-CM | POA: Diagnosis not present

## 2018-04-05 ENCOUNTER — Encounter: Payer: Self-pay | Admitting: Family Medicine

## 2018-04-05 LAB — CBC WITH DIFFERENTIAL/PLATELET
BASOS: 0 %
Basophils Absolute: 0 10*3/uL (ref 0.0–0.2)
EOS (ABSOLUTE): 0.3 10*3/uL (ref 0.0–0.4)
EOS: 3 %
HEMATOCRIT: 36.2 % (ref 34.0–46.6)
Hemoglobin: 12.3 g/dL (ref 11.1–15.9)
Immature Grans (Abs): 0 10*3/uL (ref 0.0–0.1)
Immature Granulocytes: 0 %
LYMPHS ABS: 4.6 10*3/uL — AB (ref 0.7–3.1)
Lymphs: 52 %
MCH: 29.5 pg (ref 26.6–33.0)
MCHC: 34 g/dL (ref 31.5–35.7)
MCV: 87 fL (ref 79–97)
MONOS ABS: 0.6 10*3/uL (ref 0.1–0.9)
Monocytes: 6 %
Neutrophils Absolute: 3.6 10*3/uL (ref 1.4–7.0)
Neutrophils: 39 %
Platelets: 340 10*3/uL (ref 150–379)
RBC: 4.17 x10E6/uL (ref 3.77–5.28)
RDW: 14.5 % (ref 12.3–15.4)
WBC: 9.1 10*3/uL (ref 3.4–10.8)

## 2018-04-05 LAB — BASIC METABOLIC PANEL
BUN / CREAT RATIO: 17 (ref 12–28)
BUN: 16 mg/dL (ref 8–27)
CO2: 28 mmol/L (ref 20–29)
CREATININE: 0.92 mg/dL (ref 0.57–1.00)
Calcium: 9.5 mg/dL (ref 8.7–10.3)
Chloride: 95 mmol/L — ABNORMAL LOW (ref 96–106)
GFR calc Af Amer: 68 mL/min/{1.73_m2} (ref >59)
GFR calc non Af Amer: 59 mL/min/{1.73_m2} — ABNORMAL LOW (ref >59)
GLUCOSE: 100 mg/dL — AB (ref 65–99)
Potassium: 3.8 mmol/L (ref 3.5–5.2)
SODIUM: 138 mmol/L (ref 134–144)

## 2018-04-05 LAB — HEPATIC FUNCTION PANEL
ALBUMIN: 4.4 g/dL (ref 3.5–4.7)
ALK PHOS: 69 IU/L (ref 39–117)
ALT: 13 IU/L (ref 0–32)
AST: 18 IU/L (ref 0–40)
Bilirubin Total: 0.5 mg/dL (ref 0.0–1.2)
Bilirubin, Direct: 0.15 mg/dL (ref 0.00–0.40)
Total Protein: 6.8 g/dL (ref 6.0–8.5)

## 2018-04-05 LAB — LIPID PANEL
CHOLESTEROL TOTAL: 156 mg/dL (ref 100–199)
Chol/HDL Ratio: 2.3 ratio (ref 0.0–4.4)
HDL: 69 mg/dL (ref >39)
LDL Calculated: 70 mg/dL (ref 0–99)
TRIGLYCERIDES: 86 mg/dL (ref 0–149)
VLDL Cholesterol Cal: 17 mg/dL (ref 5–40)

## 2018-04-05 LAB — TSH: TSH: 1.4 u[IU]/mL (ref 0.450–4.500)

## 2018-04-18 DIAGNOSIS — Z961 Presence of intraocular lens: Secondary | ICD-10-CM | POA: Diagnosis not present

## 2018-04-28 ENCOUNTER — Other Ambulatory Visit: Payer: Self-pay | Admitting: Family Medicine

## 2018-04-28 NOTE — Telephone Encounter (Signed)
May have this and 4 refills 

## 2018-05-12 ENCOUNTER — Encounter: Payer: Self-pay | Admitting: Family Medicine

## 2018-05-12 ENCOUNTER — Ambulatory Visit (INDEPENDENT_AMBULATORY_CARE_PROVIDER_SITE_OTHER): Payer: PPO | Admitting: Family Medicine

## 2018-05-12 VITALS — BP 118/76 | Ht 66.5 in | Wt 188.6 lb

## 2018-05-12 DIAGNOSIS — F321 Major depressive disorder, single episode, moderate: Secondary | ICD-10-CM | POA: Diagnosis not present

## 2018-05-12 DIAGNOSIS — I10 Essential (primary) hypertension: Secondary | ICD-10-CM

## 2018-05-12 MED ORDER — SERTRALINE HCL 50 MG PO TABS: 50.0000 mg | ORAL_TABLET | Freq: Every day | ORAL | 6 refills | Status: DC

## 2018-05-12 NOTE — Progress Notes (Signed)
   Subjective:    Patient ID: [name], female    DOB: [birth year], 82 y.o.   MRN: [id number]  HPI Pt here for 6 week follow up. Provider placed pt on Zoloft 50mg  at last visit. Pt states she is doing OK on med, no side effects or complications.  Patient states she is taking Zoloft on a regular basis states it is helping Denies any side effects States her moods are improved Not tearful as much Not suicidal Patient also taken blood pressure medicine watching diet trying to stay active she relates no swelling in her legs  Review of Systems  Constitutional: Negative for activity change, fatigue and fever.  HENT: Negative for congestion.   Respiratory: Negative for cough, chest tightness and shortness of breath.   Cardiovascular: Negative for chest pain and leg swelling.  Gastrointestinal: Negative for abdominal pain.  Skin: Negative for color change.  Neurological: Negative for headaches.  Psychiatric/Behavioral: Negative for behavioral problems.       Objective:   Physical Exam  Constitutional: She appears well-nourished. No distress.  Cardiovascular: Normal rate, regular rhythm and normal heart sounds.  No murmur heard. Pulmonary/Chest: Effort normal and breath sounds normal. No respiratory distress.  Musculoskeletal: She exhibits no edema.  Lymphadenopathy:    She has no cervical adenopathy.  Neurological: She is alert. She exhibits normal muscle tone.  Psychiatric: Her behavior is normal.  Vitals reviewed.       Assessment & Plan:  Depression improving Continue current medication We talked about adjusting the medication depending on how she does Blood pressure under good control Healthy diet regular physical activity recommended Follow-up office visit in the fall time.  Continue antidepressant until the fall back to [place] if any problems

## 2018-06-22 DIAGNOSIS — L570 Actinic keratosis: Secondary | ICD-10-CM | POA: Diagnosis not present

## 2018-08-21 ENCOUNTER — Other Ambulatory Visit: Payer: Self-pay | Admitting: Family Medicine

## 2018-09-06 ENCOUNTER — Encounter: Payer: Self-pay | Admitting: Family Medicine

## 2018-09-06 ENCOUNTER — Ambulatory Visit (INDEPENDENT_AMBULATORY_CARE_PROVIDER_SITE_OTHER): Payer: PPO | Admitting: Family Medicine

## 2018-09-06 VITALS — BP 112/70 | Ht 66.5 in | Wt 183.0 lb

## 2018-09-06 DIAGNOSIS — K219 Gastro-esophageal reflux disease without esophagitis: Secondary | ICD-10-CM | POA: Diagnosis not present

## 2018-09-06 DIAGNOSIS — E038 Other specified hypothyroidism: Secondary | ICD-10-CM | POA: Diagnosis not present

## 2018-09-06 DIAGNOSIS — I10 Essential (primary) hypertension: Secondary | ICD-10-CM | POA: Diagnosis not present

## 2018-09-06 DIAGNOSIS — F411 Generalized anxiety disorder: Secondary | ICD-10-CM

## 2018-09-06 DIAGNOSIS — E7849 Other hyperlipidemia: Secondary | ICD-10-CM | POA: Diagnosis not present

## 2018-09-06 DIAGNOSIS — Z23 Encounter for immunization: Secondary | ICD-10-CM

## 2018-09-06 DIAGNOSIS — F325 Major depressive disorder, single episode, in full remission: Secondary | ICD-10-CM | POA: Diagnosis not present

## 2018-09-06 MED ORDER — POTASSIUM CHLORIDE CRYS ER 10 MEQ PO TBCR: 10.0000 meq | EXTENDED_RELEASE_TABLET | Freq: Every day | ORAL | 1 refills | Status: DC

## 2018-09-06 MED ORDER — ZOSTER VAC RECOMB ADJUVANTED 50 MCG/0.5ML IM SUSR: 0.5000 mL | Freq: Once | INTRAMUSCULAR | 1 refills | Status: AC

## 2018-09-06 MED ORDER — SERTRALINE HCL 50 MG PO TABS: 50.0000 mg | ORAL_TABLET | Freq: Every day | ORAL | 6 refills | Status: DC

## 2018-09-06 MED ORDER — ALPRAZOLAM 0.5 MG PO TABS: 0.5000 mg | ORAL_TABLET | Freq: Two times a day (BID) | ORAL | 5 refills | Status: DC | PRN

## 2018-09-06 MED ORDER — METOPROLOL SUCCINATE ER 25 MG PO TB24: 25.0000 mg | ORAL_TABLET | Freq: Every day | ORAL | 1 refills | Status: DC

## 2018-09-06 MED ORDER — LEVOTHYROXINE SODIUM 112 MCG PO TABS: ORAL_TABLET | ORAL | 1 refills | Status: DC

## 2018-09-06 NOTE — Patient Instructions (Signed)

## 2018-09-06 NOTE — Progress Notes (Signed)
Subjective:    Patient ID: [name], female    DOB: 12-Oct-1936, 82 y.o.   MRN: [id number]  Hypertension  This is a chronic problem. Pertinent negatives include no chest pain or shortness of breath. There are no compliance problems (eats healthy, walks for exercise).    Concerns about abdominal cramping.  She relates when she was certain movements she feels severe cramping in the mid abdomen and it causes her to be concerned she denies any rectal bleeding dysphagia she has been watching her diet cutting back on carbohydrates and has lost some weight she is also walking at least every other day Flu vaccine today.    Review of Systems  Constitutional: Negative for activity change, appetite change and fatigue.  HENT: Negative for congestion and rhinorrhea.   Respiratory: Negative for cough and shortness of breath.   Cardiovascular: Negative for chest pain and leg swelling.  Gastrointestinal: Negative for abdominal pain and diarrhea.  Endocrine: Negative for polydipsia and polyphagia.  Skin: Negative for color change.  Neurological: Negative for dizziness and weakness.  Psychiatric/Behavioral: Negative for behavioral problems and confusion.       Objective:   Physical Exam  Constitutional: She appears well-nourished. No distress.  HENT:  Head: Normocephalic and atraumatic.  Eyes: Right eye exhibits no discharge. Left eye exhibits no discharge.  Neck: No tracheal deviation present.  Cardiovascular: Normal rate, regular rhythm and normal heart sounds.  No murmur heard. Pulmonary/Chest: Effort normal and breath sounds normal. No respiratory distress.  Musculoskeletal: She exhibits no edema.  Lymphadenopathy:    She has no cervical adenopathy.  Neurological: She is alert. Coordination normal.  Skin: Skin is warm and dry.  Psychiatric: She has a normal mood and affect. Her behavior is normal.  Vitals reviewed.   Abdomen I do not feel any hernias abdominal        Assessment & Plan:  /Abdominal cramping I think this is more muscle recommend stretching aspects  HTN- Patient was seen today as part of a visit regarding hypertension. The importance of healthy diet and regular physical activity was discussed. The importance of compliance with medications discussed.  Ideal goal is to keep blood pressure low elevated levels certainly below 950/93 when possible.  The patient was counseled that keeping blood pressure under control lessen his risk of complications.  The importance of regular follow-ups was discussed with the patient.  Low-salt diet such as DASH recommended.  Regular physical activity was recommended as well.  Patient was advised to keep regular follow-ups.  Generalized anxiety uses Xanax in the evening today refills given moods are doing well  History of depression and anxiety under good control currently in remission but wants to continue medication continue this  Patient was seen today regarding hypothyroidism.  Importance of healthy diet, regular physical activity was discussed.  Importance of compliance with medication and regular checks regarding this was discussed.   The patient was seen today for GERD. Patient benefits from medication. Patient to continue medication. Keep all regular follow ups. Extremely nice patient Some mild anxiety related issues No depression currently Wants to continue medication  25 minutes was spent with the patient.  This statement verifies that 25 minutes was indeed spent with the patient.  More than 50% of this visit-total duration of the visit-was spent in counseling and coordination of care. The issues that the patient came in for today as reflected in the diagnosis (s) please refer to documentation for further details.  Follow-up in 6  months  Patient is losing weight by watching diet

## 2018-10-07 ENCOUNTER — Other Ambulatory Visit: Payer: Self-pay | Admitting: Family Medicine

## 2018-10-07 NOTE — Telephone Encounter (Signed)
Refills-may have 4

## 2018-11-07 ENCOUNTER — Other Ambulatory Visit: Payer: Self-pay | Admitting: Family Medicine

## 2018-12-01 ENCOUNTER — Ambulatory Visit (INDEPENDENT_AMBULATORY_CARE_PROVIDER_SITE_OTHER): Payer: PPO | Admitting: Family Medicine

## 2018-12-01 ENCOUNTER — Encounter: Payer: Self-pay | Admitting: Family Medicine

## 2018-12-01 VITALS — BP 118/70 | Temp 98.1°F | Ht 66.5 in | Wt 186.0 lb

## 2018-12-01 DIAGNOSIS — J019 Acute sinusitis, unspecified: Secondary | ICD-10-CM

## 2018-12-01 MED ORDER — AMOXICILLIN 500 MG PO TABS: 500.0000 mg | ORAL_TABLET | Freq: Three times a day (TID) | ORAL | 0 refills | Status: DC

## 2018-12-01 NOTE — Progress Notes (Signed)
   Subjective:    Patient ID: [name], female    DOB: 07-28-1936, 82 y.o.   MRN: [id number]  Sinusitis  This is a new problem. Episode onset: 6 days. Associated symptoms include congestion, coughing, ear pain and headaches. Pertinent negatives include no shortness of breath.   Viral-like illness for a few days now with sinus pressure drainage not able to hear as well drainage down the back of throat coughing up yellow-green phlegm   Review of Systems  Constitutional: Negative for activity change and fever.  HENT: Positive for congestion, ear pain and rhinorrhea.   Eyes: Negative for discharge.  Respiratory: Positive for cough. Negative for shortness of breath and wheezing.   Cardiovascular: Negative for chest pain.  Neurological: Positive for headaches.       Objective:   Physical Exam Vitals signs and nursing note reviewed.  Constitutional:      Appearance: She is well-developed.  HENT:     Head: Normocephalic.     Nose: Nose normal.     Mouth/Throat:     Pharynx: No oropharyngeal exudate.  Neck:     Musculoskeletal: Neck supple.  Cardiovascular:     Rate and Rhythm: Normal rate.     Heart sounds: Normal heart sounds. No murmur.  Pulmonary:     Effort: Pulmonary effort is normal.     Breath sounds: Normal breath sounds. No wheezing.  Lymphadenopathy:     Cervical: No cervical adenopathy.  Skin:    General: Skin is warm and dry.           Assessment & Plan:  I believe the patient has some secondary rhinosinusitis Probably started off of the virus I find no evidence of pneumonia currently I do not recommend x-rays or lab work Patient not toxic no respiratory distress Antibiotics prescribed warning signs discussed follow-up if progressive troubles or if worse Warnings were discussed regarding what to watch for

## 2018-12-26 ENCOUNTER — Other Ambulatory Visit: Payer: Self-pay | Admitting: Family Medicine

## 2019-02-07 ENCOUNTER — Other Ambulatory Visit: Payer: Self-pay | Admitting: Family Medicine

## 2019-02-10 ENCOUNTER — Other Ambulatory Visit (HOSPITAL_COMMUNITY): Payer: Self-pay | Admitting: Family Medicine

## 2019-02-10 DIAGNOSIS — Z1231 Encounter for screening mammogram for malignant neoplasm of breast: Secondary | ICD-10-CM

## 2019-02-27 ENCOUNTER — Ambulatory Visit (HOSPITAL_COMMUNITY): Payer: PPO

## 2019-03-08 ENCOUNTER — Other Ambulatory Visit: Payer: Self-pay

## 2019-03-08 ENCOUNTER — Encounter: Payer: Self-pay | Admitting: Family Medicine

## 2019-03-08 ENCOUNTER — Other Ambulatory Visit: Payer: Self-pay | Admitting: Family Medicine

## 2019-03-08 ENCOUNTER — Ambulatory Visit (INDEPENDENT_AMBULATORY_CARE_PROVIDER_SITE_OTHER): Payer: PPO | Admitting: Family Medicine

## 2019-03-08 VITALS — Wt 185.0 lb

## 2019-03-08 DIAGNOSIS — E038 Other specified hypothyroidism: Secondary | ICD-10-CM

## 2019-03-08 DIAGNOSIS — I10 Essential (primary) hypertension: Secondary | ICD-10-CM | POA: Diagnosis not present

## 2019-03-08 DIAGNOSIS — E7849 Other hyperlipidemia: Secondary | ICD-10-CM | POA: Diagnosis not present

## 2019-03-08 MED ORDER — METOPROLOL SUCCINATE ER 25 MG PO TB24: 25.0000 mg | ORAL_TABLET | Freq: Every day | ORAL | 1 refills | Status: DC

## 2019-03-08 MED ORDER — ALPRAZOLAM 0.5 MG PO TABS: 0.5000 mg | ORAL_TABLET | Freq: Two times a day (BID) | ORAL | 5 refills | Status: DC | PRN

## 2019-03-08 MED ORDER — LEVOTHYROXINE SODIUM 112 MCG PO TABS: ORAL_TABLET | ORAL | 1 refills | Status: DC

## 2019-03-08 MED ORDER — INDAPAMIDE 2.5 MG PO TABS: 2.5000 mg | ORAL_TABLET | Freq: Every day | ORAL | 1 refills | Status: DC

## 2019-03-08 MED ORDER — PANTOPRAZOLE SODIUM 40 MG PO TBEC: DELAYED_RELEASE_TABLET | ORAL | 1 refills | Status: DC

## 2019-03-08 MED ORDER — POTASSIUM CHLORIDE CRYS ER 10 MEQ PO TBCR: 10.0000 meq | EXTENDED_RELEASE_TABLET | Freq: Every day | ORAL | 1 refills | Status: DC

## 2019-03-08 MED ORDER — SERTRALINE HCL 50 MG PO TABS: 50.0000 mg | ORAL_TABLET | Freq: Every day | ORAL | 1 refills | Status: DC

## 2019-03-08 NOTE — Progress Notes (Signed)
   Subjective:    Patient ID: [name], female    DOB: [birth year], 83 y.o.   MRN: [id number]  Telephone visit only video was not available  Hypertension  This is a chronic problem. There are no compliance problems.   pt here today for 6 month followup,  The patient relates compliance with her blood pressure medicine takes it on a regular basis watches salt in her diet stays physically active does not feel she is having any problems  Patient does take her antidepressant denies being depressed currently states it does benefit her she would like to continue it  She is taking her Xanax as needed but does not abuse it or overuse it She is taking her thyroid medicine every morning.  States her energy levels overall is doing okay. She also takes her acid blocker daily states reflux under good control.  Pt states that she does not check BP regular due to not having a machine.   Virtual Visit via Telephone Note  I connected with [name] on 03/08/19 at  9:00 AM EDT by telephone and verified that I am speaking with the correct person using two identifiers.   I discussed the limitations, risks, security and privacy concerns of performing an evaluation and management service by telephone and the availability of in person appointments. I also discussed with the patient that there may be a patient responsible charge related to this service. The patient expressed understanding and agreed to proceed.   History of Present Illness:    Observations/Objective:   Assessment and Plan:   Follow Up Instructions:    I discussed the assessment and treatment plan with the patient. The patient was provided an opportunity to ask questions and all were answered. The patient agreed with the plan and demonstrated an understanding of the instructions.   The patient was advised to call back or seek an in-person evaluation if the symptoms worsen or if the condition fails to improve as anticipated.   I provided 15 minutes of non-face-to-face time during this encounter.  15 minutes was spent with patient today discussing healthcare issues which they came.  More than 50% of this visit-total duration of visit-was spent in counseling and coordination of care.  Please see diagnosis regarding the focus of this coordination and care  [name], LPN  Review of Systems     Objective:   Physical Exam  Unable to do physical exam via telephone      Assessment & Plan:  HTN good control continue current measures Hyperlipidemia previous labs reviewed new labs will be ordered for the summer Thyroid medicine each morning as directed follow-up if problems Lab work in the summer follow-up here 6 months

## 2019-04-03 ENCOUNTER — Ambulatory Visit (HOSPITAL_COMMUNITY): Payer: PPO

## 2019-04-17 ENCOUNTER — Other Ambulatory Visit: Payer: Self-pay

## 2019-04-17 ENCOUNTER — Ambulatory Visit (HOSPITAL_COMMUNITY)
Admission: RE | Admit: 2019-04-17 | Discharge: 2019-04-17 | Disposition: A | Discharge status: Home / Self Care | Payer: PPO | Source: Ambulatory Visit | Attending: Family Medicine | Admitting: Family Medicine

## 2019-04-17 DIAGNOSIS — Z1231 Encounter for screening mammogram for malignant neoplasm of breast: Secondary | ICD-10-CM

## 2019-04-18 ENCOUNTER — Ambulatory Visit (INDEPENDENT_AMBULATORY_CARE_PROVIDER_SITE_OTHER): Payer: PPO | Admitting: Family Medicine

## 2019-04-18 DIAGNOSIS — N3000 Acute cystitis without hematuria: Secondary | ICD-10-CM | POA: Diagnosis not present

## 2019-04-18 MED ORDER — CEFPROZIL 500 MG PO TABS: 500.0000 mg | ORAL_TABLET | Freq: Two times a day (BID) | ORAL | 0 refills | Status: DC

## 2019-04-18 NOTE — Progress Notes (Signed)
   Subjective:    Patient ID: [name], female    DOB: Jan 27, 1936, 83 y.o.   MRN: [id number] Phone only patient did not have video Urinary Tract Infection   This is a new problem. Episode onset: 10-14 days. Associated symptoms include flank pain and frequency. Pertinent negatives include no nausea or vomiting. Associated symptoms comments: Pt states she has no burning. Pt does have cloudy urine and a chemical like smell. No fever. Treatments tried: Cranberry Juice and Azo.  Patient relates urinary frequency some irritation and burning denies sweats chills nausea vomiting flank pain or high fevers Virtual Visit via Video Note  I connected with [name] on 04/18/19 at  3:00 PM EDT by a video enabled telemedicine application and verified that I am speaking with the correct person using two identifiers.  Location: Patient:home Provider: office   I discussed the limitations of evaluation and management by telemedicine and the availability of in person appointments. The patient expressed understanding and agreed to proceed.  History of Present Illness:    Observations/Objective:   Assessment and Plan:   Follow Up Instructions:    I discussed the assessment and treatment plan with the patient. The patient was provided an opportunity to ask questions and all were answered. The patient agreed with the plan and demonstrated an understanding of the instructions.   The patient was advised to call back or seek an in-person evaluation if the symptoms worsen or if the condition fails to improve as anticipated.  I provided 12 minutes of non-face-to-face time during this encounter.   [name], LPN    Review of Systems  Constitutional: Negative for activity change, appetite change, fatigue and fever.  HENT: Negative for congestion and rhinorrhea.   Respiratory: Negative for cough and shortness of breath.   Cardiovascular: Negative for chest pain and leg swelling.   Gastrointestinal: Negative for abdominal pain, nausea and vomiting.  Genitourinary: Positive for flank pain and frequency.  Skin: Negative for color change.  Neurological: Negative for dizziness and weakness.  Psychiatric/Behavioral: Negative for agitation and confusion.       Objective:   Physical Exam   Unable to do physical exam via telephone call     Assessment & Plan:  Probable UTI recommend antibiotics over the next several days follow-up if progressive troubles worse warning signs were discussed in detail recheck if any problems

## 2019-04-28 ENCOUNTER — Other Ambulatory Visit: Payer: Self-pay | Admitting: Family Medicine

## 2019-05-15 ENCOUNTER — Other Ambulatory Visit: Payer: Self-pay | Admitting: Family Medicine

## 2019-05-29 DIAGNOSIS — Z961 Presence of intraocular lens: Secondary | ICD-10-CM | POA: Diagnosis not present

## 2019-05-29 DIAGNOSIS — H524 Presbyopia: Secondary | ICD-10-CM | POA: Diagnosis not present

## 2019-06-26 DIAGNOSIS — L570 Actinic keratosis: Secondary | ICD-10-CM | POA: Diagnosis not present

## 2019-06-26 DIAGNOSIS — C4442 Squamous cell carcinoma of skin of scalp and neck: Secondary | ICD-10-CM | POA: Diagnosis not present

## 2019-06-26 DIAGNOSIS — D485 Neoplasm of uncertain behavior of skin: Secondary | ICD-10-CM | POA: Diagnosis not present

## 2019-06-29 ENCOUNTER — Other Ambulatory Visit: Payer: Self-pay

## 2019-06-29 DIAGNOSIS — I10 Essential (primary) hypertension: Secondary | ICD-10-CM | POA: Diagnosis not present

## 2019-06-29 DIAGNOSIS — E7849 Other hyperlipidemia: Secondary | ICD-10-CM | POA: Diagnosis not present

## 2019-06-29 DIAGNOSIS — E038 Other specified hypothyroidism: Secondary | ICD-10-CM | POA: Diagnosis not present

## 2019-06-30 LAB — TSH: TSH: 4.09 u[IU]/mL (ref 0.450–4.500)

## 2019-06-30 LAB — LIPID PANEL
Chol/HDL Ratio: 2.3 ratio (ref 0.0–4.4)
Cholesterol, Total: 163 mg/dL (ref 100–199)
HDL: 70 mg/dL (ref >39)
LDL Calculated: 75 mg/dL (ref 0–99)
Triglycerides: 92 mg/dL (ref 0–149)
VLDL Cholesterol Cal: 18 mg/dL (ref 5–40)

## 2019-06-30 LAB — BASIC METABOLIC PANEL
BUN/Creatinine Ratio: 16 (ref 12–28)
BUN: 14 mg/dL (ref 8–27)
CO2: 27 mmol/L (ref 20–29)
Calcium: 9 mg/dL (ref 8.7–10.3)
Chloride: 99 mmol/L (ref 96–106)
Creatinine, Ser: 0.87 mg/dL (ref 0.57–1.00)
GFR calc Af Amer: 72 mL/min/{1.73_m2} (ref >59)
GFR calc non Af Amer: 63 mL/min/{1.73_m2} (ref >59)
Glucose: 95 mg/dL (ref 65–99)
Potassium: 4 mmol/L (ref 3.5–5.2)
Sodium: 141 mmol/L (ref 134–144)

## 2019-06-30 LAB — HEPATIC FUNCTION PANEL
ALT: 7 IU/L (ref 0–32)
AST: 15 IU/L (ref 0–40)
Albumin: 4.1 g/dL (ref 3.6–4.6)
Alkaline Phosphatase: 66 IU/L (ref 39–117)
Bilirubin Total: 0.5 mg/dL (ref 0.0–1.2)
Bilirubin, Direct: 0.13 mg/dL (ref 0.00–0.40)
Total Protein: 6.6 g/dL (ref 6.0–8.5)

## 2019-07-01 ENCOUNTER — Encounter: Payer: Self-pay | Admitting: Family Medicine

## 2019-07-06 DIAGNOSIS — C4442 Squamous cell carcinoma of skin of scalp and neck: Secondary | ICD-10-CM | POA: Diagnosis not present

## 2019-08-02 ENCOUNTER — Other Ambulatory Visit: Payer: Self-pay | Admitting: Family Medicine

## 2019-08-02 NOTE — Telephone Encounter (Signed)
May have this +1 refill Will need follow-up visit this fall either virtual or in person

## 2019-09-20 ENCOUNTER — Other Ambulatory Visit: Payer: Self-pay | Admitting: Family Medicine

## 2019-09-21 NOTE — Telephone Encounter (Signed)
May have 3 months on each recommend follow-up virtual this fall

## 2019-10-13 ENCOUNTER — Other Ambulatory Visit: Payer: Self-pay | Admitting: Family Medicine

## 2019-10-13 NOTE — Telephone Encounter (Signed)
Please schedule follow up

## 2019-10-13 NOTE — Telephone Encounter (Signed)
Virtual follow-up in November or December I will send in refills

## 2019-10-16 NOTE — Telephone Encounter (Signed)
Pt is scheduled for 12/3. Pharmacy is out of medication right now and not sure when they will get any in.   Also pt would like to know if the regular flu shot is ok to get. She has tried getting the senior shot but everywhere is out.

## 2019-10-19 ENCOUNTER — Other Ambulatory Visit: Payer: Self-pay | Admitting: Family Medicine

## 2019-11-03 ENCOUNTER — Ambulatory Visit: Payer: PPO | Admitting: Family Medicine

## 2019-11-08 ENCOUNTER — Ambulatory Visit (INDEPENDENT_AMBULATORY_CARE_PROVIDER_SITE_OTHER): Payer: PPO | Admitting: Family Medicine

## 2019-11-08 ENCOUNTER — Other Ambulatory Visit: Payer: Self-pay

## 2019-11-08 DIAGNOSIS — E038 Other specified hypothyroidism: Secondary | ICD-10-CM | POA: Diagnosis not present

## 2019-11-08 DIAGNOSIS — E7849 Other hyperlipidemia: Secondary | ICD-10-CM | POA: Diagnosis not present

## 2019-11-08 DIAGNOSIS — I10 Essential (primary) hypertension: Secondary | ICD-10-CM

## 2019-11-08 MED ORDER — METOPROLOL SUCCINATE ER 25 MG PO TB24: ORAL_TABLET | ORAL | 1 refills | Status: DC

## 2019-11-08 MED ORDER — POTASSIUM CHLORIDE CRYS ER 10 MEQ PO TBCR: EXTENDED_RELEASE_TABLET | ORAL | 1 refills | Status: DC

## 2019-11-08 MED ORDER — AMOXICILLIN 500 MG PO TABS: 500.0000 mg | ORAL_TABLET | Freq: Three times a day (TID) | ORAL | 0 refills | Status: DC

## 2019-11-08 MED ORDER — SERTRALINE HCL 50 MG PO TABS: 50.0000 mg | ORAL_TABLET | Freq: Every day | ORAL | 1 refills | Status: DC

## 2019-11-08 MED ORDER — INDAPAMIDE 2.5 MG PO TABS: 2.5000 mg | ORAL_TABLET | Freq: Every day | ORAL | 1 refills | Status: DC

## 2019-11-08 MED ORDER — ALPRAZOLAM 0.5 MG PO TABS: ORAL_TABLET | ORAL | 5 refills | Status: DC

## 2019-11-08 MED ORDER — LEVOTHYROXINE SODIUM 112 MCG PO TABS: ORAL_TABLET | ORAL | 1 refills | Status: DC

## 2019-11-08 NOTE — Progress Notes (Signed)
Subjective:    Patient ID: [name], female    DOB: [birth year], 83 y.o.   MRN: [id number]  HPImed check up. Pt states she is doing well with all her meds.  Patient has underlying anxiety related issues takes Xanax on a as needed basis does not cause drowsiness is been doing so for years  Has thyroid takes her medicine states energy level doing well compliant with medicine.  Previous lab work look good  Blood pressure good control currently takes her medicine on a regular basis.  Denies any setbacks  Moods overall are doing okay on medications she would like to continue this denies being depressed currently. Cough and congestion for 2 weeks.  Patient denies high fever chills sweats wheezing difficulty breathing states her overall energy level seemingly okay. Virtual Visit via Telephone Note  I connected with [name] on 11/08/19 at  9:30 AM EST by telephone and verified that I am speaking with the correct person using two identifiers.  Location: Patient: home Provider: office   I discussed the limitations, risks, security and privacy concerns of performing an evaluation and management service by telephone and the availability of in person appointments. I also discussed with the patient that there may be a patient responsible charge related to this service. The patient expressed understanding and agreed to proceed.   History of Present Illness: Fall Risk  06/29/2019 03/31/2018 03/31/2018 04/28/2017 04/20/2016  Falls in the past year? 0 Yes Yes No No  Comment Emmi Telephone Survey: data to providers prior to load - - - -  Number falls in past yr: - 1 1 - -  Injury with Fall? - No Yes - -  Follow up - Education provided Education provided - -      Observations/Objective:   Assessment and Plan:   Follow Up Instructions:    I discussed the assessment and treatment plan with the patient. The patient was provided an opportunity to ask questions and all were answered. The  patient agreed with the plan and demonstrated an understanding of the instructions.   The patient was advised to call back or seek an in-person evaluation if the symptoms worsen or if the condition fails to improve as anticipated.  I provided 25 minutes of non-face-to-face time during this encounter.     Review of Systems  Constitutional: Negative for activity change, appetite change and fatigue.  HENT: Negative for congestion and rhinorrhea.   Respiratory: Negative for cough and shortness of breath.   Cardiovascular: Negative for chest pain and leg swelling.  Gastrointestinal: Negative for abdominal pain and diarrhea.  Endocrine: Negative for polydipsia and polyphagia.  Skin: Negative for color change.  Neurological: Negative for dizziness and weakness.  Psychiatric/Behavioral: Negative for behavioral problems and confusion.       Objective:   Physical Exam  Today's visit was via telephone Physical exam was not possible for this visit       Assessment & Plan:  1. Other specified hypothyroidism Hypothyroidism labs last time overall look good continue current medication.  2. Essential hypertension, benign Blood pressure good control per patient watch diet stay active  3. Other hyperlipidemia Cholesterol last time look good continue current measures  Patient with recent cough congestion amoxicillin sent in for possible sinus infection she was encouraged to get Covid testing  History of depression currently doing well on sertraline she would like to continue this.  25 minutes was spent with the patient.  This statement verifies that 1  minutes was indeed spent with the patient.  More than 50% of this visit-total duration of the visit-was spent in counseling and coordination of care. The issues that the patient came in for today as reflected in the diagnosis (s) please refer to documentation for further details.

## 2020-02-09 ENCOUNTER — Telehealth: Payer: Self-pay | Admitting: Family Medicine

## 2020-02-09 MED ORDER — PANTOPRAZOLE SODIUM 40 MG PO TBEC: 40.0000 mg | DELAYED_RELEASE_TABLET | Freq: Every day | ORAL | 1 refills | Status: DC

## 2020-02-09 NOTE — Addendum Note (Signed)
Addended by: [name] on: 02/09/2020 02:20 PM   Modules accepted: Orders

## 2020-02-09 NOTE — Telephone Encounter (Signed)
[facility] requesting refill on Pantoprazole 40 mg tablets. Take one tablet po daily. Pt last seen 11/08/2019 for hypothyroidism. Please advise. Thank you

## 2020-02-09 NOTE — Telephone Encounter (Signed)
Medication refill sent to pharmacy  

## 2020-02-09 NOTE — Telephone Encounter (Signed)
May have 6 months worth on refills

## 2020-02-29 ENCOUNTER — Other Ambulatory Visit: Payer: Self-pay | Admitting: Family Medicine

## 2020-02-29 MED ORDER — ALPRAZOLAM 0.5 MG PO TABS: ORAL_TABLET | ORAL | 3 refills | Status: DC

## 2020-02-29 NOTE — Telephone Encounter (Signed)
Medication pended. Please advise. Thank you °

## 2020-02-29 NOTE — Telephone Encounter (Signed)
May have refill with 3 additional refills please pend

## 2020-02-29 NOTE — Addendum Note (Signed)
Addended by: Sallee Lange A on: 02/29/2020 10:46 AM   Modules accepted: Orders

## 2020-02-29 NOTE — Telephone Encounter (Signed)
[facility] [address] requesting refill on Alprazolam 0.5 mg tablets. Take one tablet po BID prn. Pt last seen 11/08/2019 for hypothyroidism. Please advise. Thank you

## 2020-02-29 NOTE — Addendum Note (Signed)
Addended by: [name] on: 02/29/2020 10:41 AM   Modules accepted: Orders

## 2020-02-29 NOTE — Telephone Encounter (Signed)
Medication was sent in   

## 2020-03-15 ENCOUNTER — Other Ambulatory Visit (HOSPITAL_COMMUNITY): Payer: Self-pay | Admitting: Family Medicine

## 2020-03-15 DIAGNOSIS — Z1231 Encounter for screening mammogram for malignant neoplasm of breast: Secondary | ICD-10-CM

## 2020-03-22 DIAGNOSIS — M9903 Segmental and somatic dysfunction of lumbar region: Secondary | ICD-10-CM | POA: Diagnosis not present

## 2020-03-22 DIAGNOSIS — M5441 Lumbago with sciatica, right side: Secondary | ICD-10-CM | POA: Diagnosis not present

## 2020-03-22 DIAGNOSIS — M9905 Segmental and somatic dysfunction of pelvic region: Secondary | ICD-10-CM | POA: Diagnosis not present

## 2020-03-22 DIAGNOSIS — M9902 Segmental and somatic dysfunction of thoracic region: Secondary | ICD-10-CM | POA: Diagnosis not present

## 2020-03-22 DIAGNOSIS — M546 Pain in thoracic spine: Secondary | ICD-10-CM | POA: Diagnosis not present

## 2020-03-25 DIAGNOSIS — M9902 Segmental and somatic dysfunction of thoracic region: Secondary | ICD-10-CM | POA: Diagnosis not present

## 2020-03-25 DIAGNOSIS — M9905 Segmental and somatic dysfunction of pelvic region: Secondary | ICD-10-CM | POA: Diagnosis not present

## 2020-03-25 DIAGNOSIS — M546 Pain in thoracic spine: Secondary | ICD-10-CM | POA: Diagnosis not present

## 2020-03-25 DIAGNOSIS — M5441 Lumbago with sciatica, right side: Secondary | ICD-10-CM | POA: Diagnosis not present

## 2020-03-25 DIAGNOSIS — M9903 Segmental and somatic dysfunction of lumbar region: Secondary | ICD-10-CM | POA: Diagnosis not present

## 2020-03-27 DIAGNOSIS — M9905 Segmental and somatic dysfunction of pelvic region: Secondary | ICD-10-CM | POA: Diagnosis not present

## 2020-03-27 DIAGNOSIS — M9902 Segmental and somatic dysfunction of thoracic region: Secondary | ICD-10-CM | POA: Diagnosis not present

## 2020-03-27 DIAGNOSIS — M5441 Lumbago with sciatica, right side: Secondary | ICD-10-CM | POA: Diagnosis not present

## 2020-03-27 DIAGNOSIS — M9903 Segmental and somatic dysfunction of lumbar region: Secondary | ICD-10-CM | POA: Diagnosis not present

## 2020-03-27 DIAGNOSIS — M546 Pain in thoracic spine: Secondary | ICD-10-CM | POA: Diagnosis not present

## 2020-03-29 DIAGNOSIS — M546 Pain in thoracic spine: Secondary | ICD-10-CM | POA: Diagnosis not present

## 2020-03-29 DIAGNOSIS — M9905 Segmental and somatic dysfunction of pelvic region: Secondary | ICD-10-CM | POA: Diagnosis not present

## 2020-03-29 DIAGNOSIS — M5441 Lumbago with sciatica, right side: Secondary | ICD-10-CM | POA: Diagnosis not present

## 2020-03-29 DIAGNOSIS — M9902 Segmental and somatic dysfunction of thoracic region: Secondary | ICD-10-CM | POA: Diagnosis not present

## 2020-03-29 DIAGNOSIS — M9903 Segmental and somatic dysfunction of lumbar region: Secondary | ICD-10-CM | POA: Diagnosis not present

## 2020-04-01 DIAGNOSIS — M9902 Segmental and somatic dysfunction of thoracic region: Secondary | ICD-10-CM | POA: Diagnosis not present

## 2020-04-01 DIAGNOSIS — M9903 Segmental and somatic dysfunction of lumbar region: Secondary | ICD-10-CM | POA: Diagnosis not present

## 2020-04-01 DIAGNOSIS — M546 Pain in thoracic spine: Secondary | ICD-10-CM | POA: Diagnosis not present

## 2020-04-01 DIAGNOSIS — M9905 Segmental and somatic dysfunction of pelvic region: Secondary | ICD-10-CM | POA: Diagnosis not present

## 2020-04-01 DIAGNOSIS — M5441 Lumbago with sciatica, right side: Secondary | ICD-10-CM | POA: Diagnosis not present

## 2020-04-05 DIAGNOSIS — M9903 Segmental and somatic dysfunction of lumbar region: Secondary | ICD-10-CM | POA: Diagnosis not present

## 2020-04-05 DIAGNOSIS — M9902 Segmental and somatic dysfunction of thoracic region: Secondary | ICD-10-CM | POA: Diagnosis not present

## 2020-04-05 DIAGNOSIS — M546 Pain in thoracic spine: Secondary | ICD-10-CM | POA: Diagnosis not present

## 2020-04-05 DIAGNOSIS — M5441 Lumbago with sciatica, right side: Secondary | ICD-10-CM | POA: Diagnosis not present

## 2020-04-05 DIAGNOSIS — M9905 Segmental and somatic dysfunction of pelvic region: Secondary | ICD-10-CM | POA: Diagnosis not present

## 2020-04-08 DIAGNOSIS — M9903 Segmental and somatic dysfunction of lumbar region: Secondary | ICD-10-CM | POA: Diagnosis not present

## 2020-04-08 DIAGNOSIS — M546 Pain in thoracic spine: Secondary | ICD-10-CM | POA: Diagnosis not present

## 2020-04-08 DIAGNOSIS — M5441 Lumbago with sciatica, right side: Secondary | ICD-10-CM | POA: Diagnosis not present

## 2020-04-08 DIAGNOSIS — M9905 Segmental and somatic dysfunction of pelvic region: Secondary | ICD-10-CM | POA: Diagnosis not present

## 2020-04-08 DIAGNOSIS — M9902 Segmental and somatic dysfunction of thoracic region: Secondary | ICD-10-CM | POA: Diagnosis not present

## 2020-04-12 DIAGNOSIS — M9903 Segmental and somatic dysfunction of lumbar region: Secondary | ICD-10-CM | POA: Diagnosis not present

## 2020-04-12 DIAGNOSIS — M546 Pain in thoracic spine: Secondary | ICD-10-CM | POA: Diagnosis not present

## 2020-04-12 DIAGNOSIS — M9905 Segmental and somatic dysfunction of pelvic region: Secondary | ICD-10-CM | POA: Diagnosis not present

## 2020-04-12 DIAGNOSIS — M5441 Lumbago with sciatica, right side: Secondary | ICD-10-CM | POA: Diagnosis not present

## 2020-04-12 DIAGNOSIS — M9902 Segmental and somatic dysfunction of thoracic region: Secondary | ICD-10-CM | POA: Diagnosis not present

## 2020-04-15 DIAGNOSIS — M546 Pain in thoracic spine: Secondary | ICD-10-CM | POA: Diagnosis not present

## 2020-04-15 DIAGNOSIS — M9905 Segmental and somatic dysfunction of pelvic region: Secondary | ICD-10-CM | POA: Diagnosis not present

## 2020-04-15 DIAGNOSIS — M9903 Segmental and somatic dysfunction of lumbar region: Secondary | ICD-10-CM | POA: Diagnosis not present

## 2020-04-15 DIAGNOSIS — M5441 Lumbago with sciatica, right side: Secondary | ICD-10-CM | POA: Diagnosis not present

## 2020-04-15 DIAGNOSIS — M9902 Segmental and somatic dysfunction of thoracic region: Secondary | ICD-10-CM | POA: Diagnosis not present

## 2020-04-18 ENCOUNTER — Ambulatory Visit (HOSPITAL_COMMUNITY): Payer: PPO

## 2020-04-19 ENCOUNTER — Ambulatory Visit (HOSPITAL_COMMUNITY)
Admission: RE | Admit: 2020-04-19 | Discharge: 2020-04-19 | Disposition: A | Discharge status: Home / Self Care | Payer: PPO | Source: Ambulatory Visit | Attending: Family Medicine | Admitting: Family Medicine

## 2020-04-19 ENCOUNTER — Other Ambulatory Visit: Payer: Self-pay

## 2020-04-19 DIAGNOSIS — M9903 Segmental and somatic dysfunction of lumbar region: Secondary | ICD-10-CM | POA: Diagnosis not present

## 2020-04-19 DIAGNOSIS — Z1231 Encounter for screening mammogram for malignant neoplasm of breast: Secondary | ICD-10-CM | POA: Diagnosis not present

## 2020-04-19 DIAGNOSIS — M546 Pain in thoracic spine: Secondary | ICD-10-CM | POA: Diagnosis not present

## 2020-04-19 DIAGNOSIS — M9902 Segmental and somatic dysfunction of thoracic region: Secondary | ICD-10-CM | POA: Diagnosis not present

## 2020-04-19 DIAGNOSIS — M5441 Lumbago with sciatica, right side: Secondary | ICD-10-CM | POA: Diagnosis not present

## 2020-04-19 DIAGNOSIS — M9905 Segmental and somatic dysfunction of pelvic region: Secondary | ICD-10-CM | POA: Diagnosis not present

## 2020-05-02 ENCOUNTER — Telehealth: Payer: Self-pay | Admitting: Family Medicine

## 2020-05-02 DIAGNOSIS — R5381 Other malaise: Secondary | ICD-10-CM | POA: Diagnosis not present

## 2020-05-02 DIAGNOSIS — R457 State of emotional shock and stress, unspecified: Secondary | ICD-10-CM | POA: Diagnosis not present

## 2020-05-02 DIAGNOSIS — R110 Nausea: Secondary | ICD-10-CM | POA: Diagnosis not present

## 2020-05-02 NOTE — Telephone Encounter (Signed)
Pt scheduled at 3 6/4

## 2020-05-02 NOTE — Telephone Encounter (Signed)
Pt contacting the office to make Dr. Nicki Reaper aware yesterday she felt like she had the flu and burning up all day. She started vomiting and shaking all over her hands was shaking real bad and they contacted EMS and EMS came out and said her BP was good and she is feeling better today. They advised her contact her PCP office to make them aware. Pt is drinking gatorade today and had 1/2 a piece of toast today. She has an appt on 6/10 with Dr. Nicki Reaper.

## 2020-05-02 NOTE — Telephone Encounter (Signed)
Please contact patient You have my permission to encourage patient to follow-up with [place] at 3 PM on Friday Rather than wait till next week This will allow [place] to check her urine and make sure nothing else is going on

## 2020-05-03 ENCOUNTER — Other Ambulatory Visit: Payer: Self-pay

## 2020-05-03 ENCOUNTER — Ambulatory Visit (INDEPENDENT_AMBULATORY_CARE_PROVIDER_SITE_OTHER): Payer: PPO | Admitting: Family Medicine

## 2020-05-03 VITALS — BP 110/70 | Temp 97.7°F | Ht 66.5 in | Wt 187.0 lb

## 2020-05-03 DIAGNOSIS — R5383 Other fatigue: Secondary | ICD-10-CM

## 2020-05-03 DIAGNOSIS — R300 Dysuria: Secondary | ICD-10-CM

## 2020-05-03 DIAGNOSIS — R109 Unspecified abdominal pain: Secondary | ICD-10-CM

## 2020-05-03 DIAGNOSIS — R6883 Chills (without fever): Secondary | ICD-10-CM | POA: Diagnosis not present

## 2020-05-03 DIAGNOSIS — R61 Generalized hyperhidrosis: Secondary | ICD-10-CM | POA: Diagnosis not present

## 2020-05-03 DIAGNOSIS — E038 Other specified hypothyroidism: Secondary | ICD-10-CM

## 2020-05-03 LAB — POCT URINALYSIS DIPSTICK
Spec Grav, UA: 1.01 (ref 1.010–1.025)
pH, UA: 5 (ref 5.0–8.0)

## 2020-05-03 MED ORDER — CEPHALEXIN 500 MG PO CAPS: 500.0000 mg | ORAL_CAPSULE | Freq: Four times a day (QID) | ORAL | 0 refills | Status: DC

## 2020-05-03 NOTE — Progress Notes (Signed)
   Subjective:    Patient ID: [name], female    DOB: 14-Jan-1936, 84 y.o.   MRN: [id number]  HPI  pt states she was vomiting and feeling like she was going to pass out yesterday morning. EMS was called.  Patient felt lightheaded she felt faint she also had chills had some intermittent abdominal pain and vomiting but denied flank pain denies fevers denies chest tightness pressure pain shortness of breath was seen by the EMS was instructed to follow-up with [place]. In addition to this patient with fatigue tiredness over the past few weeks No energy for the past couple of weeks.   Fall Risk  05/03/2020 11/08/2019 06/29/2019 03/31/2018 03/31/2018  Falls in the past year? 1 0 0 Yes Yes  Comment - - Emmi Telephone Survey: data to providers prior to load - -  Number falls in past yr: 0 - - 1 1  Injury with Fall? 0 - - No Yes  Risk for fall due to : History of fall(s) - - - -  Follow up Falls evaluation completed;Education provided Falls evaluation completed - Education provided Education provided     Review of Systems  Constitutional: Positive for fatigue. Negative for activity change and appetite change.  HENT: Negative for congestion and rhinorrhea.   Respiratory: Negative for cough and shortness of breath.   Cardiovascular: Negative for chest pain and leg swelling.  Gastrointestinal: Negative for abdominal pain and diarrhea.  Endocrine: Negative for polydipsia and polyphagia.  Skin: Negative for color change.  Neurological: Positive for tremors and weakness. Negative for dizziness.  Psychiatric/Behavioral: Negative for behavioral problems and confusion.       Objective:   Physical Exam Vitals reviewed.  Constitutional:      General: She is not in acute distress. HENT:     Head: Normocephalic.  Cardiovascular:     Rate and Rhythm: Normal rate and regular rhythm.     Heart sounds: Normal heart sounds. No murmur.  Pulmonary:     Effort: Pulmonary effort is normal.     Breath sounds:  Normal breath sounds.  Lymphadenopathy:     Cervical: No cervical adenopathy.  Neurological:     Mental Status: She is alert.  Psychiatric:        Behavior: Behavior normal.    Patient not toxic       Assessment & Plan:  Lab work indicated Probable UTI Chills concerning for possibility of early progressive infection Antibiotics for 7 days Follow-up in 3 weeks for urinalysis and urine check Await the results of the lab work Warning signs discussed if high fevers flank pain or worse go to ER

## 2020-05-04 LAB — CBC WITH DIFFERENTIAL/PLATELET
Basophils Absolute: 0.1 10*3/uL (ref 0.0–0.2)
Basos: 0 %
EOS (ABSOLUTE): 0.1 10*3/uL (ref 0.0–0.4)
Eos: 1 %
Hematocrit: 37.2 % (ref 34.0–46.6)
Hemoglobin: 12 g/dL (ref 11.1–15.9)
Immature Grans (Abs): 0.1 10*3/uL (ref 0.0–0.1)
Immature Granulocytes: 1 %
Lymphocytes Absolute: 4.7 10*3/uL — ABNORMAL HIGH (ref 0.7–3.1)
Lymphs: 31 %
MCH: 29.2 pg (ref 26.6–33.0)
MCHC: 32.3 g/dL (ref 31.5–35.7)
MCV: 91 fL (ref 79–97)
Monocytes Absolute: 1.7 10*3/uL — ABNORMAL HIGH (ref 0.1–0.9)
Monocytes: 11 %
Neutrophils Absolute: 8.4 10*3/uL — ABNORMAL HIGH (ref 1.4–7.0)
Neutrophils: 56 %
Platelets: 323 10*3/uL (ref 150–450)
RBC: 4.11 x10E6/uL (ref 3.77–5.28)
RDW: 12.5 % (ref 11.7–15.4)
WBC: 15 10*3/uL — ABNORMAL HIGH (ref 3.4–10.8)

## 2020-05-04 LAB — HEPATIC FUNCTION PANEL
ALT: 13 IU/L (ref 0–32)
AST: 20 IU/L (ref 0–40)
Albumin: 4.4 g/dL (ref 3.6–4.6)
Alkaline Phosphatase: 65 IU/L (ref 48–121)
Bilirubin Total: 0.2 mg/dL (ref 0.0–1.2)
Bilirubin, Direct: 0.12 mg/dL (ref 0.00–0.40)
Total Protein: 7.3 g/dL (ref 6.0–8.5)

## 2020-05-04 LAB — BASIC METABOLIC PANEL
BUN/Creatinine Ratio: 19 (ref 12–28)
BUN: 20 mg/dL (ref 8–27)
CO2: 27 mmol/L (ref 20–29)
Calcium: 9 mg/dL (ref 8.7–10.3)
Chloride: 91 mmol/L — ABNORMAL LOW (ref 96–106)
Creatinine, Ser: 1.07 mg/dL — ABNORMAL HIGH (ref 0.57–1.00)
GFR calc Af Amer: 56 mL/min/{1.73_m2} — ABNORMAL LOW (ref >59)
GFR calc non Af Amer: 48 mL/min/{1.73_m2} — ABNORMAL LOW (ref >59)
Glucose: 93 mg/dL (ref 65–99)
Potassium: 3.5 mmol/L (ref 3.5–5.2)
Sodium: 135 mmol/L (ref 134–144)

## 2020-05-04 LAB — LIPASE: Lipase: 19 U/L (ref 14–85)

## 2020-05-04 LAB — TSH: TSH: 7.52 u[IU]/mL — ABNORMAL HIGH (ref 0.450–4.500)

## 2020-05-06 ENCOUNTER — Other Ambulatory Visit: Payer: Self-pay

## 2020-05-06 LAB — URINE CULTURE

## 2020-05-06 MED ORDER — LEVOTHYROXINE SODIUM 125 MCG PO TABS: ORAL_TABLET | ORAL | 1 refills | Status: DC

## 2020-05-09 ENCOUNTER — Ambulatory Visit: Payer: PPO | Admitting: Family Medicine

## 2020-05-10 ENCOUNTER — Other Ambulatory Visit: Payer: Self-pay | Admitting: Family Medicine

## 2020-05-13 DIAGNOSIS — M160 Bilateral primary osteoarthritis of hip: Secondary | ICD-10-CM | POA: Diagnosis not present

## 2020-05-13 DIAGNOSIS — M5136 Other intervertebral disc degeneration, lumbar region: Secondary | ICD-10-CM | POA: Diagnosis not present

## 2020-05-15 ENCOUNTER — Ambulatory Visit (INDEPENDENT_AMBULATORY_CARE_PROVIDER_SITE_OTHER): Payer: PPO | Admitting: Family Medicine

## 2020-05-15 ENCOUNTER — Encounter: Payer: Self-pay | Admitting: Family Medicine

## 2020-05-15 ENCOUNTER — Other Ambulatory Visit: Payer: Self-pay

## 2020-05-15 VITALS — BP 130/82 | Temp 98.2°F | Ht 66.5 in | Wt 189.0 lb

## 2020-05-15 DIAGNOSIS — Z13820 Encounter for screening for osteoporosis: Secondary | ICD-10-CM | POA: Diagnosis not present

## 2020-05-15 DIAGNOSIS — Z780 Asymptomatic menopausal state: Secondary | ICD-10-CM

## 2020-05-15 DIAGNOSIS — E7849 Other hyperlipidemia: Secondary | ICD-10-CM

## 2020-05-15 DIAGNOSIS — R300 Dysuria: Secondary | ICD-10-CM | POA: Diagnosis not present

## 2020-05-15 DIAGNOSIS — I10 Essential (primary) hypertension: Secondary | ICD-10-CM

## 2020-05-15 DIAGNOSIS — E038 Other specified hypothyroidism: Secondary | ICD-10-CM

## 2020-05-15 LAB — POCT URINALYSIS DIPSTICK
Spec Grav, UA: 1.02 (ref 1.010–1.025)
pH, UA: 6 (ref 5.0–8.0)

## 2020-05-15 NOTE — Progress Notes (Signed)
   Subjective:    Patient ID: [name], female    DOB: 1936/04/30, 84 y.o.   MRN: [id number]  HPI  Patient arrives for a follow up on UTI. Patient states she is doing better. No longer having chills or fever no [place] not having any type of chest pain shortness of breath did adjust her thyroid medicine feels much much better. Review of Systems  Constitutional: Negative for activity change, appetite change and fatigue.  HENT: Negative for congestion and rhinorrhea.   Respiratory: Negative for cough and shortness of breath.   Cardiovascular: Negative for chest pain and leg swelling.  Gastrointestinal: Negative for abdominal pain and diarrhea.  Endocrine: Negative for polydipsia and polyphagia.  Skin: Negative for color change.  Neurological: Negative for dizziness and weakness.  Psychiatric/Behavioral: Negative for behavioral problems and confusion.       Objective:   Physical Exam Vitals reviewed.  Constitutional:      General: She is not in acute distress. HENT:     Head: Normocephalic and atraumatic.  Eyes:     General:        Right eye: No discharge.        Left eye: No discharge.  Neck:     Trachea: No tracheal deviation.  Cardiovascular:     Rate and Rhythm: Normal rate and regular rhythm.     Heart sounds: Normal heart sounds. No murmur heard.   Pulmonary:     Effort: Pulmonary effort is normal. No respiratory distress.     Breath sounds: Normal breath sounds.  Lymphadenopathy:     Cervical: No cervical adenopathy.  Skin:    General: Skin is warm and dry.  Neurological:     Mental Status: She is alert.     Coordination: Coordination normal.  Psychiatric:        Behavior: Behavior normal.       Fall Risk  05/03/2020 11/08/2019 06/29/2019 03/31/2018 03/31/2018  Falls in the past year? 1 0 0 Yes Yes  Comment - - Emmi Telephone Survey: data to providers prior to load - -  Number falls in past yr: 0 - - 1 1  Injury with Fall? 0 - - No Yes  Risk for fall due  to : History of fall(s) - - - -  Follow up Falls evaluation completed;Education provided Falls evaluation completed - Education provided Education provided       Assessment & Plan:  1. Dysuria Urine actually looks good under the microscope no need for culture - POCT urinalysis dipstick  2. Post-menopausal Bone density indicated - DG Bone Density  3. Screening for osteoporosis Bone density indicated - DG Bone Density  4. Essential hypertension, benign Blood pressure very good continue current measures - Lipid panel  5. Other specified hypothyroidism Thyroid should be under better control with the new adjustment recheck lab work in the fall with a follow-up visit in October - TSH  6. Other hyperlipidemia Lipid profile in October - Lipid panel

## 2020-05-20 ENCOUNTER — Other Ambulatory Visit (HOSPITAL_COMMUNITY): Payer: PPO

## 2020-05-23 ENCOUNTER — Ambulatory Visit (INDEPENDENT_AMBULATORY_CARE_PROVIDER_SITE_OTHER): Payer: PPO | Admitting: Family Medicine

## 2020-05-23 ENCOUNTER — Telehealth: Payer: Self-pay | Admitting: *Deleted

## 2020-05-23 ENCOUNTER — Other Ambulatory Visit: Payer: Self-pay

## 2020-05-23 DIAGNOSIS — J019 Acute sinusitis, unspecified: Secondary | ICD-10-CM

## 2020-05-23 MED ORDER — AMOXICILLIN 500 MG PO TABS: 500.0000 mg | ORAL_TABLET | Freq: Three times a day (TID) | ORAL | 0 refills | Status: DC

## 2020-05-23 NOTE — Progress Notes (Signed)
   Subjective:    Patient ID: [name], female    DOB: 01/19/1936, 84 y.o.   MRN: [id number]  HPI Patient calls in today with complaints of congestion and sinus pressure. Patient started with fatigue, vomiting mucous and headaches. Patient is starting to feel better but can't get rid of the congestion and sinus pressure.  Patient with head congestion drainage coughing been going on for the past week seemingly somewhat better the past couple days denies high fever chills sweats wheezing difficulty breathing Virtual Visit via Video Note  I connected with [name] on 05/23/20 at 11:00 AM EDT by a video enabled telemedicine application and verified that I am speaking with the correct person using two identifiers.  Location: Patient: home Provider: office   I discussed the limitations of evaluation and management by telemedicine and the availability of in person appointments. The patient expressed understanding and agreed to proceed.  History of Present Illness:    Observations/Objective:   Assessment and Plan:   Follow Up Instructions:    I discussed the assessment and treatment plan with the patient. The patient was provided an opportunity to ask questions and all were answered. The patient agreed with the plan and demonstrated an understanding of the instructions.   The patient was advised to call back or seek an in-person evaluation if the symptoms worsen or if the condition fails to improve as anticipated.  I provided 20 minutes of non-face-to-face time during this encounter. Including documentation   Review of Systems  Constitutional: Negative for activity change and fever.  HENT: Positive for congestion and rhinorrhea. Negative for ear pain.   Eyes: Negative for discharge.  Respiratory: Positive for cough. Negative for shortness of breath and wheezing.   Cardiovascular: Negative for chest pain.       Objective:   Physical Exam  Today's visit was via  telephone Physical exam was not possible for this visit       Assessment & Plan:  Acute rhinosinusitis Antibiotic prescribed warning signs discussed follow-up r if problems

## 2020-05-23 NOTE — Telephone Encounter (Signed)
[name], [place] are scheduled for a virtual visit with your provider today.    Just as we do with appointments in the office, we must obtain your consent to participate.  Your consent will be active for this visit and any virtual visit you may have with one of our providers in the next 365 days.    If you have a MyChart account, I can also send a copy of this consent to you electronically.  All virtual visits are billed to your insurance company just like a traditional visit in the office.  As this is a virtual visit, video technology does not allow for your provider to perform a traditional examination.  This may limit your provider's ability to fully assess your condition.  If your provider identifies any concerns that need to be evaluated in person or the need to arrange testing such as labs, EKG, etc, we will make arrangements to do so.    Although advances in technology are sophisticated, we cannot ensure that it will always work on either your end or our end.  If the connection with a video visit is poor, we may have to switch to a telephone visit.  With either a video or telephone visit, we are not always able to ensure that we have a secure connection.   I need to obtain your verbal consent now.   Are you willing to proceed with your visit today?   [name] has provided verbal consent on 05/23/2020 for a virtual visit (video or telephone).   [name], LPN 7/47/1595  3:96 AM

## 2020-05-27 ENCOUNTER — Other Ambulatory Visit: Payer: Self-pay | Admitting: Family Medicine

## 2020-06-06 ENCOUNTER — Other Ambulatory Visit: Payer: Self-pay

## 2020-06-06 ENCOUNTER — Ambulatory Visit (HOSPITAL_COMMUNITY)
Admission: RE | Admit: 2020-06-06 | Discharge: 2020-06-06 | Disposition: A | Discharge status: Home / Self Care | Payer: PPO | Source: Ambulatory Visit | Attending: Family Medicine | Admitting: Family Medicine

## 2020-06-06 DIAGNOSIS — Z13820 Encounter for screening for osteoporosis: Secondary | ICD-10-CM | POA: Insufficient documentation

## 2020-06-06 DIAGNOSIS — R29890 Loss of height: Secondary | ICD-10-CM | POA: Diagnosis not present

## 2020-06-06 DIAGNOSIS — M85852 Other specified disorders of bone density and structure, left thigh: Secondary | ICD-10-CM | POA: Diagnosis not present

## 2020-06-06 DIAGNOSIS — Z780 Asymptomatic menopausal state: Secondary | ICD-10-CM | POA: Diagnosis not present

## 2020-06-18 DIAGNOSIS — Z961 Presence of intraocular lens: Secondary | ICD-10-CM | POA: Diagnosis not present

## 2020-06-18 DIAGNOSIS — H52203 Unspecified astigmatism, bilateral: Secondary | ICD-10-CM | POA: Diagnosis not present

## 2020-06-18 DIAGNOSIS — H5213 Myopia, bilateral: Secondary | ICD-10-CM | POA: Diagnosis not present

## 2020-06-18 DIAGNOSIS — H524 Presbyopia: Secondary | ICD-10-CM | POA: Diagnosis not present

## 2020-06-25 DIAGNOSIS — L570 Actinic keratosis: Secondary | ICD-10-CM | POA: Diagnosis not present

## 2020-06-25 DIAGNOSIS — Z85828 Personal history of other malignant neoplasm of skin: Secondary | ICD-10-CM | POA: Diagnosis not present

## 2020-06-25 DIAGNOSIS — L719 Rosacea, unspecified: Secondary | ICD-10-CM | POA: Diagnosis not present

## 2020-06-25 DIAGNOSIS — L814 Other melanin hyperpigmentation: Secondary | ICD-10-CM | POA: Diagnosis not present

## 2020-06-25 DIAGNOSIS — L280 Lichen simplex chronicus: Secondary | ICD-10-CM | POA: Diagnosis not present

## 2020-06-25 DIAGNOSIS — D485 Neoplasm of uncertain behavior of skin: Secondary | ICD-10-CM | POA: Diagnosis not present

## 2020-07-08 ENCOUNTER — Other Ambulatory Visit: Payer: Self-pay

## 2020-07-08 ENCOUNTER — Ambulatory Visit (INDEPENDENT_AMBULATORY_CARE_PROVIDER_SITE_OTHER): Payer: PPO | Admitting: Family Medicine

## 2020-07-08 VITALS — BP 122/82 | Ht 66.5 in | Wt 189.0 lb

## 2020-07-08 DIAGNOSIS — M85851 Other specified disorders of bone density and structure, right thigh: Secondary | ICD-10-CM | POA: Diagnosis not present

## 2020-07-08 DIAGNOSIS — M85852 Other specified disorders of bone density and structure, left thigh: Secondary | ICD-10-CM | POA: Diagnosis not present

## 2020-07-08 MED ORDER — ALENDRONATE SODIUM 70 MG PO TABS
70.0000 mg | ORAL_TABLET | ORAL | 11 refills | Status: DC
Start: 2020-07-08 — End: 2020-10-21

## 2020-07-08 NOTE — Patient Instructions (Signed)
Choose one day a week  Same day every week  Take first thing in the morning on a empty stomach Upright don't lay down after taking Take with 8 oz of water  Call us if heartburn symptoms ( stop Fosamax at that point)  plase drop off your xray

## 2020-07-08 NOTE — Progress Notes (Signed)
   Subjective:    Patient ID: [name], female    DOB: 1936/06/02, 84 y.o.   MRN: [id number]  HPI  Patient arrives to discuss osteoporosis.  20 minute discussion today on her recent bone density test Recent bone density test showed osteopenia as well as abnormal FRAX score.  We discussed the benefits and risk of medications and the risk of hip fractures and consequences of hip fractures Review of Systems     Objective:   Physical Exam        Assessment & Plan:  Osteopenia Abnormal FRAX score Importance of treating this Continue vitamin D and calcium Fosamax once weekly with tall glass of water on empty stomach if does not tolerate or severe heartburn stop medicine notify us Repeat bone density in 2 years

## 2020-07-10 ENCOUNTER — Other Ambulatory Visit: Payer: Self-pay | Admitting: Family Medicine

## 2020-07-17 ENCOUNTER — Other Ambulatory Visit: Payer: Self-pay | Admitting: Family Medicine

## 2020-08-06 ENCOUNTER — Other Ambulatory Visit: Payer: Self-pay | Admitting: Family Medicine

## 2020-08-07 ENCOUNTER — Telehealth: Payer: Self-pay | Admitting: Family Medicine

## 2020-08-07 NOTE — Telephone Encounter (Signed)
Please advise. Thank you

## 2020-08-07 NOTE — Telephone Encounter (Signed)
Patient dropped off some xrays that she wanted Dr. Nicki Reaper to look at.  Papers put in Dr. Nicki Reaper folder.

## 2020-08-29 NOTE — Telephone Encounter (Signed)
I was able to look at the x-ray pictures appears to be degenerative changes patient under the care of orthopedics

## 2020-10-14 ENCOUNTER — Telehealth: Payer: Self-pay

## 2020-10-14 ENCOUNTER — Other Ambulatory Visit: Payer: Self-pay | Admitting: *Deleted

## 2020-10-14 DIAGNOSIS — E038 Other specified hypothyroidism: Secondary | ICD-10-CM

## 2020-10-14 DIAGNOSIS — E7849 Other hyperlipidemia: Secondary | ICD-10-CM

## 2020-10-14 DIAGNOSIS — Z79899 Other long term (current) drug therapy: Secondary | ICD-10-CM

## 2020-10-14 DIAGNOSIS — I10 Essential (primary) hypertension: Secondary | ICD-10-CM

## 2020-10-14 NOTE — Telephone Encounter (Signed)
Pt is calling saying Dr Nicki Reaper is setting her up with blood work and wanted to know when she will be able to have this done?   Pt call [phone]

## 2020-10-14 NOTE — Telephone Encounter (Signed)
Labs were ordered, pt notified.

## 2020-10-15 DIAGNOSIS — E7849 Other hyperlipidemia: Secondary | ICD-10-CM | POA: Diagnosis not present

## 2020-10-15 DIAGNOSIS — Z79899 Other long term (current) drug therapy: Secondary | ICD-10-CM | POA: Diagnosis not present

## 2020-10-15 DIAGNOSIS — E038 Other specified hypothyroidism: Secondary | ICD-10-CM | POA: Diagnosis not present

## 2020-10-15 DIAGNOSIS — I10 Essential (primary) hypertension: Secondary | ICD-10-CM | POA: Diagnosis not present

## 2020-10-16 LAB — BASIC METABOLIC PANEL
BUN/Creatinine Ratio: 15 (ref 12–28)
BUN: 14 mg/dL (ref 8–27)
CO2: 27 mmol/L (ref 20–29)
Calcium: 9.3 mg/dL (ref 8.7–10.3)
Chloride: 97 mmol/L (ref 96–106)
Creatinine, Ser: 0.93 mg/dL (ref 0.57–1.00)
GFR calc Af Amer: 66 mL/min/{1.73_m2} (ref >59)
GFR calc non Af Amer: 57 mL/min/{1.73_m2} — ABNORMAL LOW (ref >59)
Glucose: 110 mg/dL — ABNORMAL HIGH (ref 65–99)
Potassium: 4.2 mmol/L (ref 3.5–5.2)
Sodium: 139 mmol/L (ref 134–144)

## 2020-10-16 LAB — CBC WITH DIFFERENTIAL/PLATELET
Basophils Absolute: 0.1 10*3/uL (ref 0.0–0.2)
Basos: 1 %
EOS (ABSOLUTE): 0.3 10*3/uL (ref 0.0–0.4)
Eos: 3 %
Hematocrit: 38.5 % (ref 34.0–46.6)
Hemoglobin: 12.6 g/dL (ref 11.1–15.9)
Immature Grans (Abs): 0 10*3/uL (ref 0.0–0.1)
Immature Granulocytes: 0 %
Lymphocytes Absolute: 5 10*3/uL — ABNORMAL HIGH (ref 0.7–3.1)
Lymphs: 50 %
MCH: 29 pg (ref 26.6–33.0)
MCHC: 32.7 g/dL (ref 31.5–35.7)
MCV: 89 fL (ref 79–97)
Monocytes Absolute: 0.7 10*3/uL (ref 0.1–0.9)
Monocytes: 7 %
Neutrophils Absolute: 3.9 10*3/uL (ref 1.4–7.0)
Neutrophils: 39 %
Platelets: 380 10*3/uL (ref 150–450)
RBC: 4.35 x10E6/uL (ref 3.77–5.28)
RDW: 12.6 % (ref 11.7–15.4)
WBC: 10 10*3/uL (ref 3.4–10.8)

## 2020-10-16 LAB — TSH: TSH: 0.61 u[IU]/mL (ref 0.450–4.500)

## 2020-10-16 LAB — HEPATIC FUNCTION PANEL
ALT: 11 IU/L (ref 0–32)
AST: 16 IU/L (ref 0–40)
Albumin: 4.2 g/dL (ref 3.6–4.6)
Alkaline Phosphatase: 71 IU/L (ref 44–121)
Bilirubin Total: 0.6 mg/dL (ref 0.0–1.2)
Bilirubin, Direct: 0.19 mg/dL (ref 0.00–0.40)
Total Protein: 6.8 g/dL (ref 6.0–8.5)

## 2020-10-16 LAB — LIPID PANEL
Chol/HDL Ratio: 2.7 ratio (ref 0.0–4.4)
Cholesterol, Total: 187 mg/dL (ref 100–199)
HDL: 69 mg/dL (ref >39)
LDL Chol Calc (NIH): 96 mg/dL (ref 0–99)
Triglycerides: 128 mg/dL (ref 0–149)
VLDL Cholesterol Cal: 22 mg/dL (ref 5–40)

## 2020-10-21 ENCOUNTER — Other Ambulatory Visit: Payer: Self-pay

## 2020-10-21 ENCOUNTER — Ambulatory Visit (INDEPENDENT_AMBULATORY_CARE_PROVIDER_SITE_OTHER): Payer: PPO | Admitting: Family Medicine

## 2020-10-21 ENCOUNTER — Encounter: Payer: Self-pay | Admitting: Family Medicine

## 2020-10-21 VITALS — BP 128/74 | HR 76 | Temp 97.7°F | Ht 66.5 in | Wt 190.0 lb

## 2020-10-21 DIAGNOSIS — I10 Essential (primary) hypertension: Secondary | ICD-10-CM

## 2020-10-21 DIAGNOSIS — R7301 Impaired fasting glucose: Secondary | ICD-10-CM

## 2020-10-21 DIAGNOSIS — E7849 Other hyperlipidemia: Secondary | ICD-10-CM | POA: Diagnosis not present

## 2020-10-21 DIAGNOSIS — K219 Gastro-esophageal reflux disease without esophagitis: Secondary | ICD-10-CM

## 2020-10-21 DIAGNOSIS — E038 Other specified hypothyroidism: Secondary | ICD-10-CM

## 2020-10-21 DIAGNOSIS — E669 Obesity, unspecified: Secondary | ICD-10-CM

## 2020-10-21 MED ORDER — POTASSIUM CHLORIDE CRYS ER 10 MEQ PO TBCR: EXTENDED_RELEASE_TABLET | ORAL | 1 refills | Status: DC

## 2020-10-21 MED ORDER — METOPROLOL SUCCINATE ER 25 MG PO TB24: ORAL_TABLET | ORAL | 1 refills | Status: DC

## 2020-10-21 MED ORDER — PANTOPRAZOLE SODIUM 40 MG PO TBEC
40.0000 mg | DELAYED_RELEASE_TABLET | Freq: Every day | ORAL | 1 refills | Status: DC
Start: 2020-10-21 — End: 2021-03-20

## 2020-10-21 MED ORDER — LEVOTHYROXINE SODIUM 125 MCG PO TABS: ORAL_TABLET | ORAL | 1 refills | Status: DC

## 2020-10-21 MED ORDER — ALENDRONATE SODIUM 70 MG PO TABS
70.0000 mg | ORAL_TABLET | ORAL | 5 refills | Status: DC
Start: 2020-10-21 — End: 2021-05-19

## 2020-10-21 MED ORDER — INDAPAMIDE 2.5 MG PO TABS: ORAL_TABLET | ORAL | 1 refills | Status: DC

## 2020-10-21 MED ORDER — SERTRALINE HCL 50 MG PO TABS: ORAL_TABLET | ORAL | 1 refills | Status: DC

## 2020-10-21 MED ORDER — ALPRAZOLAM 0.5 MG PO TABS
ORAL_TABLET | ORAL | 5 refills | Status: DC
Start: 2020-10-21 — End: 2020-10-28

## 2020-10-21 NOTE — Patient Instructions (Signed)
  Results for orders placed or performed in visit on 74/08/14  Basic metabolic panel  Result Value Ref Range   Glucose 110 (H) 65 - 99 mg/dL   BUN 14 8 - 27 mg/dL   Creatinine, Ser 0.93 0.57 - 1.00 mg/dL   GFR calc non Af Amer 57 (L) >59 mL/min/1.73   GFR calc Af Amer 66 >59 mL/min/1.73   BUN/Creatinine Ratio 15 12 - 28   Sodium 139 134 - 144 mmol/L   Potassium 4.2 3.5 - 5.2 mmol/L   Chloride 97 96 - 106 mmol/L   CO2 27 20 - 29 mmol/L   Calcium 9.3 8.7 - 10.3 mg/dL  CBC with Differential/Platelet  Result Value Ref Range   WBC 10.0 3.4 - 10.8 x10E3/uL   RBC 4.35 3.77 - 5.28 x10E6/uL   Hemoglobin 12.6 11.1 - 15.9 g/dL   Hematocrit 38.5 34.0 - 46.6 %   MCV 89 79 - 97 fL   MCH 29.0 26.6 - 33.0 pg   MCHC 32.7 31 - 35 g/dL   RDW 12.6 11.7 - 15.4 %   Platelets 380 150 - 450 x10E3/uL   Neutrophils 39 Not Estab. %   Lymphs 50 Not Estab. %   Monocytes 7 Not Estab. %   Eos 3 Not Estab. %   Basos 1 Not Estab. %   Neutrophils Absolute 3.9 1.40 - 7.00 x10E3/uL   Lymphocytes Absolute 5.0 (H) 0 - 3 x10E3/uL   Monocytes Absolute 0.7 0 - 0 x10E3/uL   EOS (ABSOLUTE) 0.3 0.0 - 0.4 x10E3/uL   Basophils Absolute 0.1 0 - 0 x10E3/uL   Immature Granulocytes 0 Not Estab. %   Immature Grans (Abs) 0.0 0.0 - 0.1 x10E3/uL  Lipid panel  Result Value Ref Range   Cholesterol, Total 187 100 - 199 mg/dL   Triglycerides 128 0 - 149 mg/dL   HDL 69 >39 mg/dL   VLDL Cholesterol Cal 22 5 - 40 mg/dL   LDL Chol Calc (NIH) 96 0 - 99 mg/dL   Chol/HDL Ratio 2.7 0.0 - 4.4 ratio  Hepatic function panel  Result Value Ref Range   Total Protein 6.8 6.0 - 8.5 g/dL   Albumin 4.2 3.6 - 4.6 g/dL   Bilirubin Total 0.6 0.0 - 1.2 mg/dL   Bilirubin, Direct 0.19 0.00 - 0.40 mg/dL   Alkaline Phosphatase 71 44 - 121 IU/L   AST 16 0 - 40 IU/L   ALT 11 0 - 32 IU/L  TSH  Result Value Ref Range   TSH 0.610 0.450 - 4.500 uIU/mL

## 2020-10-21 NOTE — Progress Notes (Signed)
Subjective:    Patient ID: [name], female    DOB: 17-Mar-1936, 84 y.o.   MRN: [id number]  HPIfollow up on bloodwork results.   Other specified hypothyroidism  Essential hypertension, benign  Other hyperlipidemia  Gastroesophageal reflux disease without esophagitis She relates blood pressure under good control takes her medicine regular basis tries to minimize salt in the diet History of elevated cholesterol trying to watch her diet closely Mild obesity issues try to watch portions and stay active History thyroid takes her medicine denies any setbacks Moods overall are doing well denies being depressed currently is under a lot of stress with known health of her husband  Pt states no concerns today.  Results for orders placed or performed in visit on 09/15/50  Basic metabolic panel  Result Value Ref Range   Glucose 110 (H) 65 - 99 mg/dL   BUN 14 8 - 27 mg/dL   Creatinine, Ser 0.93 0.57 - 1.00 mg/dL   GFR calc non Af Amer 57 (L) >59 mL/min/1.73   GFR calc Af Amer 66 >59 mL/min/1.73   BUN/Creatinine Ratio 15 12 - 28   Sodium 139 134 - 144 mmol/L   Potassium 4.2 3.5 - 5.2 mmol/L   Chloride 97 96 - 106 mmol/L   CO2 27 20 - 29 mmol/L   Calcium 9.3 8.7 - 10.3 mg/dL  CBC with Differential/Platelet  Result Value Ref Range   WBC 10.0 3.4 - 10.8 x10E3/uL   RBC 4.35 3.77 - 5.28 x10E6/uL   Hemoglobin 12.6 11.1 - 15.9 g/dL   Hematocrit 38.5 34.0 - 46.6 %   MCV 89 79 - 97 fL   MCH 29.0 26.6 - 33.0 pg   MCHC 32.7 31 - 35 g/dL   RDW 12.6 11.7 - 15.4 %   Platelets 380 150 - 450 x10E3/uL   Neutrophils 39 Not Estab. %   Lymphs 50 Not Estab. %   Monocytes 7 Not Estab. %   Eos 3 Not Estab. %   Basos 1 Not Estab. %   Neutrophils Absolute 3.9 1.40 - 7.00 x10E3/uL   Lymphocytes Absolute 5.0 (H) 0 - 3 x10E3/uL   Monocytes Absolute 0.7 0 - 0 x10E3/uL   EOS (ABSOLUTE) 0.3 0.0 - 0.4 x10E3/uL   Basophils Absolute 0.1 0 - 0 x10E3/uL   Immature Granulocytes 0 Not Estab. %   Immature  Grans (Abs) 0.0 0.0 - 0.1 x10E3/uL  Lipid panel  Result Value Ref Range   Cholesterol, Total 187 100 - 199 mg/dL   Triglycerides 128 0 - 149 mg/dL   HDL 69 >39 mg/dL   VLDL Cholesterol Cal 22 5 - 40 mg/dL   LDL Chol Calc (NIH) 96 0 - 99 mg/dL   Chol/HDL Ratio 2.7 0.0 - 4.4 ratio  Hepatic function panel  Result Value Ref Range   Total Protein 6.8 6.0 - 8.5 g/dL   Albumin 4.2 3.6 - 4.6 g/dL   Bilirubin Total 0.6 0.0 - 1.2 mg/dL   Bilirubin, Direct 0.19 0.00 - 0.40 mg/dL   Alkaline Phosphatase 71 44 - 121 IU/L   AST 16 0 - 40 IU/L   ALT 11 0 - 32 IU/L  TSH  Result Value Ref Range   TSH 0.610 0.450 - 4.500 uIU/mL      Review of Systems  Constitutional: Negative for activity change, fatigue and fever.  HENT: Negative for congestion and rhinorrhea.   Respiratory: Negative for cough, chest tightness and shortness of breath.   Cardiovascular: Negative  for chest pain and leg swelling.  Gastrointestinal: Negative for abdominal pain and nausea.  Skin: Negative for color change.  Neurological: Negative for dizziness and headaches.  Psychiatric/Behavioral: Negative for agitation and behavioral problems.       Objective:   Physical Exam Vitals reviewed.  Constitutional:      General: She is not in acute distress. HENT:     Head: Normocephalic and atraumatic.  Eyes:     General:        Right eye: No discharge.        Left eye: No discharge.  Neck:     Trachea: No tracheal deviation.  Cardiovascular:     Rate and Rhythm: Normal rate and regular rhythm.     Heart sounds: Normal heart sounds. No murmur heard.   Pulmonary:     Effort: Pulmonary effort is normal. No respiratory distress.     Breath sounds: Normal breath sounds.  Lymphadenopathy:     Cervical: No cervical adenopathy.  Skin:    General: Skin is warm and dry.  Neurological:     Mental Status: She is alert.     Coordination: Coordination normal.  Psychiatric:        Behavior: Behavior normal.             Assessment & Plan:  1. Other specified hypothyroidism Thyroid good control Continue current measures Watch diet closely Take medicine regular basis Recheck in 5 to 6 months  2. Essential hypertension, benign Blood pressure good control continue current measures minimize salt diet stay active  3. Other hyperlipidemia Watch diet stay active no meds indicated  4. Gastroesophageal reflux disease without esophagitis Under good control with current medications  5. Fasting hyperglycemia Minimize starches stay active and watch portion control  6. Obesity (BMI 30-39.9) Portion control healthy eating  Osteopenia with increased risk of hip fracture tolerating Fosamax on a regular basis currently follow-up in the spring

## 2020-10-28 ENCOUNTER — Other Ambulatory Visit: Payer: Self-pay | Admitting: Family Medicine

## 2021-02-05 ENCOUNTER — Other Ambulatory Visit: Payer: Self-pay

## 2021-02-05 ENCOUNTER — Ambulatory Visit (INDEPENDENT_AMBULATORY_CARE_PROVIDER_SITE_OTHER): Payer: PPO | Admitting: Family Medicine

## 2021-02-05 VITALS — BP 128/78 | Temp 97.2°F | Ht 66.5 in | Wt 188.6 lb

## 2021-02-05 DIAGNOSIS — R5383 Other fatigue: Secondary | ICD-10-CM | POA: Diagnosis not present

## 2021-02-05 DIAGNOSIS — D509 Iron deficiency anemia, unspecified: Secondary | ICD-10-CM

## 2021-02-05 DIAGNOSIS — R1084 Generalized abdominal pain: Secondary | ICD-10-CM | POA: Diagnosis not present

## 2021-02-05 DIAGNOSIS — E038 Other specified hypothyroidism: Secondary | ICD-10-CM | POA: Diagnosis not present

## 2021-02-05 NOTE — Progress Notes (Signed)
   Subjective:    Patient ID: [name], female    DOB: 03/10/1936, 85 y.o.   MRN: [id number]  HPI Patient arrives to discuss fatigue for a long time. Patient states sometimes she feels like she just wants to lay down. She relates a lot of fatigue and tiredness finding herself feeling rundown.  Denies any major setbacks.  She states she is having some upper abdominal pain and discomfort been going on for months happens more so in the evening hours but to some degree during the day as well she states it does not wake her up at night.  No vomiting or diarrhea.  No bloody stools.  Energy level overall poor.  Finds her self feeling fatigued tired with most activity does not sleep well at night because she has a lot of things on her mind including her grandson who is dying of terminal cancer She denies being depressed She wonders if a lot of this is related to age but she also wants to make sure there is not more serious issues States occasionally her bowel movements are dark   Review of Systems Please see above    Objective:   Physical Exam Lungs are clear respiratory rate normal heart regular pulse normal blood pressure good extremities no edema skin warm dry abdomen is soft no guarding or rebound       Assessment & Plan:  1. Generalized abdominal pain Lab work indicated to help rule out the possibility of more serious pathology.  Start off with ultrasound may need CAT scan depending on results follow-up again in 2 to 3 weeks stool test for blood recommended - Lipase - Comprehensive metabolic panel - IFOBT POC (occult bld, rslt in office); Future - US Abdomen Complete  2. Other fatigue Look at thyroid function as well as CBC.  Need to rule out iron deficiency as well - CBC with Differential/Platelet - Ferritin - Iron Binding Cap (TIBC)([facility]) - Lipase - Comprehensive metabolic panel - IFOBT POC (occult bld, rslt in office); Future  3. Other specified  hypothyroidism Check thyroid function continue medication - TSH  4. Iron deficiency anemia, unspecified iron deficiency anemia type Check lab work check stool for blood recheck patient in 2 to 3 weeks follow-up sooner problems once again may need CAT scan depending on results of tests - CBC with Differential/Platelet - Ferritin - Iron Binding Cap (TIBC)([facility]) - Comprehensive metabolic panel - IFOBT POC (occult bld, rslt in office); Future

## 2021-02-06 LAB — IRON AND TIBC
Iron Saturation: 15 % (ref 15–55)
Iron: 52 ug/dL (ref 27–139)
Total Iron Binding Capacity: 341 ug/dL (ref 250–450)
UIBC: 289 ug/dL (ref 118–369)

## 2021-02-06 LAB — COMPREHENSIVE METABOLIC PANEL
ALT: 14 IU/L (ref 0–32)
AST: 20 IU/L (ref 0–40)
Albumin/Globulin Ratio: 1.6 (ref 1.2–2.2)
Albumin: 4.4 g/dL (ref 3.6–4.6)
Alkaline Phosphatase: 68 IU/L (ref 44–121)
BUN/Creatinine Ratio: 14 (ref 12–28)
BUN: 15 mg/dL (ref 8–27)
Bilirubin Total: 0.2 mg/dL (ref 0.0–1.2)
CO2: 27 mmol/L (ref 20–29)
Calcium: 9.4 mg/dL (ref 8.7–10.3)
Chloride: 95 mmol/L — ABNORMAL LOW (ref 96–106)
Creatinine, Ser: 1.09 mg/dL — ABNORMAL HIGH (ref 0.57–1.00)
Globulin, Total: 2.7 g/dL (ref 1.5–4.5)
Glucose: 104 mg/dL — ABNORMAL HIGH (ref 65–99)
Potassium: 3.6 mmol/L (ref 3.5–5.2)
Sodium: 138 mmol/L (ref 134–144)
Total Protein: 7.1 g/dL (ref 6.0–8.5)
eGFR: 51 mL/min/{1.73_m2} — ABNORMAL LOW (ref >59)

## 2021-02-06 LAB — CBC WITH DIFFERENTIAL/PLATELET
Basophils Absolute: 0.1 10*3/uL (ref 0.0–0.2)
Basos: 1 %
EOS (ABSOLUTE): 0.3 10*3/uL (ref 0.0–0.4)
Eos: 3 %
Hematocrit: 38.1 % (ref 34.0–46.6)
Hemoglobin: 12.6 g/dL (ref 11.1–15.9)
Immature Grans (Abs): 0 10*3/uL (ref 0.0–0.1)
Immature Granulocytes: 0 %
Lymphocytes Absolute: 5.5 10*3/uL — ABNORMAL HIGH (ref 0.7–3.1)
Lymphs: 49 %
MCH: 29.2 pg (ref 26.6–33.0)
MCHC: 33.1 g/dL (ref 31.5–35.7)
MCV: 88 fL (ref 79–97)
Monocytes Absolute: 0.7 10*3/uL (ref 0.1–0.9)
Monocytes: 7 %
Neutrophils Absolute: 4.4 10*3/uL (ref 1.4–7.0)
Neutrophils: 40 %
Platelets: 448 10*3/uL (ref 150–450)
RBC: 4.31 x10E6/uL (ref 3.77–5.28)
RDW: 12.5 % (ref 11.7–15.4)
WBC: 11 10*3/uL — ABNORMAL HIGH (ref 3.4–10.8)

## 2021-02-06 LAB — LIPASE: Lipase: 39 U/L (ref 14–85)

## 2021-02-06 LAB — TSH: TSH: 1.16 u[IU]/mL (ref 0.450–4.500)

## 2021-02-06 LAB — FERRITIN: Ferritin: 69 ng/mL (ref 15–150)

## 2021-02-07 ENCOUNTER — Telehealth: Payer: Self-pay | Admitting: Family Medicine

## 2021-02-10 ENCOUNTER — Other Ambulatory Visit: Payer: Self-pay | Admitting: *Deleted

## 2021-02-10 DIAGNOSIS — D509 Iron deficiency anemia, unspecified: Secondary | ICD-10-CM

## 2021-02-10 DIAGNOSIS — R5383 Other fatigue: Secondary | ICD-10-CM

## 2021-02-10 DIAGNOSIS — R1084 Generalized abdominal pain: Secondary | ICD-10-CM

## 2021-02-10 LAB — IFOBT (OCCULT BLOOD): IFOBT: NEGATIVE

## 2021-02-11 ENCOUNTER — Ambulatory Visit (HOSPITAL_COMMUNITY)
Admission: RE | Admit: 2021-02-11 | Discharge: 2021-02-11 | Disposition: A | Discharge status: Home / Self Care | Payer: PPO | Source: Ambulatory Visit | Attending: Family Medicine | Admitting: Family Medicine

## 2021-02-11 DIAGNOSIS — R1084 Generalized abdominal pain: Secondary | ICD-10-CM | POA: Diagnosis not present

## 2021-02-11 DIAGNOSIS — N200 Calculus of kidney: Secondary | ICD-10-CM | POA: Diagnosis not present

## 2021-03-04 ENCOUNTER — Ambulatory Visit (INDEPENDENT_AMBULATORY_CARE_PROVIDER_SITE_OTHER): Payer: PPO | Admitting: Family Medicine

## 2021-03-04 ENCOUNTER — Other Ambulatory Visit: Payer: Self-pay

## 2021-03-04 ENCOUNTER — Encounter: Payer: Self-pay | Admitting: Family Medicine

## 2021-03-04 VITALS — BP 130/88 | Temp 95.5°F | Wt 194.8 lb

## 2021-03-04 DIAGNOSIS — R1084 Generalized abdominal pain: Secondary | ICD-10-CM | POA: Diagnosis not present

## 2021-03-04 NOTE — Progress Notes (Signed)
   Subjective:    Patient ID: [name], female    DOB: Jun 16, 1936, 85 y.o.   MRN: [id number]  HPI Pt here for follow up on abdominal pain. Pt has hernia. Pain has improved but sometimes will flare up. Pt fatigued and "wore out". Pt grandson was buried yesterday.   Patient under a lot of stress Death in the family other family members sick with dementia husband took a hit to the head from a fall in the has significant cognitive issues with hallucinating [name] is trying to do the best she can tolerating all of this Not having any abdominal pain currently States occasional discomfort in the upper epigastric region that comes and goes  Review of Systems Please see above    Objective:   Physical Exam Lungs clear respiratory rate normal heart regular pulse normal abdomen is soft no guarding or rebound       Assessment & Plan:  Recheck again in 3 to 4 months if doing well no need for any further testing Patient was told if she starts having increased abdominal pain or discomfort to notify us Continue current medications Recent ultrasound labs reviewed with patient

## 2021-03-19 ENCOUNTER — Other Ambulatory Visit (HOSPITAL_COMMUNITY): Payer: Self-pay | Admitting: Family Medicine

## 2021-03-19 DIAGNOSIS — Z1231 Encounter for screening mammogram for malignant neoplasm of breast: Secondary | ICD-10-CM

## 2021-03-20 ENCOUNTER — Other Ambulatory Visit: Payer: Self-pay | Admitting: Family Medicine

## 2021-04-21 ENCOUNTER — Ambulatory Visit (INDEPENDENT_AMBULATORY_CARE_PROVIDER_SITE_OTHER): Payer: PPO | Admitting: Family Medicine

## 2021-04-21 ENCOUNTER — Other Ambulatory Visit: Payer: Self-pay | Admitting: Family Medicine

## 2021-04-21 ENCOUNTER — Other Ambulatory Visit: Payer: Self-pay

## 2021-04-21 VITALS — BP 126/82 | Temp 97.1°F | Ht 66.5 in | Wt 191.8 lb

## 2021-04-21 DIAGNOSIS — D72829 Elevated white blood cell count, unspecified: Secondary | ICD-10-CM | POA: Diagnosis not present

## 2021-04-21 DIAGNOSIS — R5383 Other fatigue: Secondary | ICD-10-CM

## 2021-04-21 DIAGNOSIS — E038 Other specified hypothyroidism: Secondary | ICD-10-CM

## 2021-04-21 DIAGNOSIS — R0609 Other forms of dyspnea: Secondary | ICD-10-CM

## 2021-04-21 DIAGNOSIS — I10 Essential (primary) hypertension: Secondary | ICD-10-CM | POA: Diagnosis not present

## 2021-04-21 DIAGNOSIS — R0600 Dyspnea, unspecified: Secondary | ICD-10-CM | POA: Diagnosis not present

## 2021-04-21 DIAGNOSIS — N289 Disorder of kidney and ureter, unspecified: Secondary | ICD-10-CM

## 2021-04-21 NOTE — Progress Notes (Signed)
   Subjective:    Patient ID: [name], female    DOB: 15-Nov-1936, 85 y.o.   MRN: [id number]  Hypertension This is a chronic problem. The current episode started more than 1 year ago. Risk factors for coronary artery disease include post-menopausal state. Treatments tried: metoprolol, lozol. There are no compliance problems.    Cant hold out to do work like she used too Patient relates a lot of fatigue tiredness gets a little out of breath with activity but denies chest pressure tightness or pain  Under a lot of stress her grandson recently passed away- also her husband has dementia issues related to a severe concussion   Review of Systems     Objective:   Physical Exam Lungs clear heart regular pulse normal extremities no edema skin warm dry       Assessment & Plan:  1. Essential hypertension, benign She will check blood work again no later than this summer.  Blood pressure overall looks good.  Continue current measures.  Reduce metoprolol to half tablet - Basic Metabolic Panel (BMET) - CBC with Differential  2. Other specified hypothyroidism Continue thyroid medicine as is recent TSH lipid - Basic Metabolic Panel (BMET) - CBC with Differential  3. Renal insufficiency Concerning renal insufficiency check be met await results - Basic Metabolic Panel (BMET) - CBC with Differential  4. Leukocytosis, unspecified type Previous CBC shows a slight elevation of the WBC recheck CBC before next visit may need further work-up if this is a trend - Basic Metabolic Panel (BMET) - CBC with Differential  5. DOE (dyspnea on exertion) Shortness of breath EKG looks good recommend echo - EKG 12-Lead  6. Other fatigue Mild fatigue could be related to age-related deconditioning but will see what above tests show

## 2021-04-23 ENCOUNTER — Other Ambulatory Visit: Payer: Self-pay

## 2021-04-23 ENCOUNTER — Ambulatory Visit (HOSPITAL_COMMUNITY)
Admission: RE | Admit: 2021-04-23 | Discharge: 2021-04-23 | Disposition: A | Discharge status: Home / Self Care | Payer: PPO | Source: Ambulatory Visit | Attending: Family Medicine | Admitting: Family Medicine

## 2021-04-23 DIAGNOSIS — Z1231 Encounter for screening mammogram for malignant neoplasm of breast: Secondary | ICD-10-CM | POA: Insufficient documentation

## 2021-04-29 ENCOUNTER — Telehealth: Payer: Self-pay | Admitting: Family Medicine

## 2021-04-29 NOTE — Telephone Encounter (Signed)
Left message for patient to call back and schedule Medicare Annual Wellness Visit (AWV) in office.   If not able to come in office, please offer to do virtually or by telephone.   Last TVD:[phone]  Please schedule at anytime with Nurse Health Advisor.

## 2021-05-01 ENCOUNTER — Other Ambulatory Visit: Payer: Self-pay | Admitting: Family Medicine

## 2021-05-01 ENCOUNTER — Other Ambulatory Visit: Payer: Self-pay

## 2021-05-01 MED ORDER — METOPROLOL SUCCINATE ER 25 MG PO TB24: ORAL_TABLET | ORAL | 1 refills | Status: DC

## 2021-05-01 NOTE — Telephone Encounter (Signed)
04/21/21 was last med check up

## 2021-05-06 ENCOUNTER — Other Ambulatory Visit: Payer: Self-pay | Admitting: *Deleted

## 2021-05-06 MED ORDER — LEVOTHYROXINE SODIUM 125 MCG PO TABS: ORAL_TABLET | ORAL | 1 refills | Status: DC

## 2021-05-13 ENCOUNTER — Other Ambulatory Visit: Payer: Self-pay

## 2021-05-13 ENCOUNTER — Ambulatory Visit (HOSPITAL_COMMUNITY)
Admission: RE | Admit: 2021-05-13 | Discharge: 2021-05-13 | Disposition: A | Discharge status: Home / Self Care | Payer: PPO | Source: Ambulatory Visit | Attending: Family Medicine | Admitting: Family Medicine

## 2021-05-13 DIAGNOSIS — R0600 Dyspnea, unspecified: Secondary | ICD-10-CM | POA: Diagnosis not present

## 2021-05-13 DIAGNOSIS — R0609 Other forms of dyspnea: Secondary | ICD-10-CM

## 2021-05-13 LAB — ECHOCARDIOGRAM COMPLETE
AR max vel: 2.17 cm2
AV Area VTI: 2.32 cm2
AV Area mean vel: 2.3 cm2
AV Mean grad: 5.7 mmHg
AV Peak grad: 10.2 mmHg
Ao pk vel: 1.6 m/s
Area-P 1/2: 4.12 cm2
S' Lateral: 3.2 cm

## 2021-05-13 NOTE — Progress Notes (Signed)
*  PRELIMINARY RESULTS* Echocardiogram 2D Echocardiogram has been performed.  Samuel Germany 05/13/2021, 12:05 PM

## 2021-05-18 ENCOUNTER — Other Ambulatory Visit: Payer: Self-pay | Admitting: Family Medicine

## 2021-05-19 ENCOUNTER — Other Ambulatory Visit: Payer: Self-pay | Admitting: Family Medicine

## 2021-05-26 NOTE — Progress Notes (Addendum)
Subjective:   [name] is a 85 y.o. female who presents for Medicare Annual (Subsequent) preventive examination.  I connected with [name]  today by telephone and verified that I am speaking with the correct person using two identifiers. Location patient: home Location provider: work Persons participating in the virtual visit: patient, provider.   I discussed the limitations, risks, security and privacy concerns of performing an evaluation and management service by telephone and the availability of in person appointments. I also discussed with the patient that there may be a patient responsible charge related to this service. The patient expressed understanding and verbally consented to this telephonic visit.    Interactive audio and video telecommunications were attempted between this provider and patient, however failed, due to patient having technical difficulties OR patient did not have access to video capability.  We continued and completed visit with audio only.     Review of Systems    N/A  Cardiac Risk Factors include: advanced age (>84men, >95 women);hypertension;dyslipidemia     Objective:    Today's Vitals   There is no height or weight on file to calculate BMI.  Advanced Directives 05/27/2021 10/09/2014 09/25/2014 09/19/2014 08/29/2014  Does Patient Have a Medical Advance Directive? No No No - No  Would patient like information on creating a medical advance directive? No - Patient declined No - patient declined information No - patient declined information Yes - Scientist, clinical (histocompatibility and immunogenetics) given Yes - Scientist, clinical (histocompatibility and immunogenetics) given    Current Medications (verified) Outpatient Encounter Medications as of 05/27/2021  Medication Sig   alendronate (FOSAMAX) 70 MG tablet TAKE 1 TABLET(70 MG) BY MOUTH EVERY 7 DAYS WITH A FULL GLASS OF WATER AND ON AN EMPTY STOMACH   ALPRAZolam (XANAX) 0.5 MG tablet TAKE 1 TABLET BY MOUTH TWICE DAILY AS NEEDED   Cholecalciferol (VITAMIN  D3) 2000 UNITS TABS Take 2 tablets by mouth daily.    indapamide (LOZOL) 2.5 MG tablet TAKE 1 TABLET(2.5 MG) BY MOUTH DAILY   levothyroxine (SYNTHROID) 125 MCG tablet Take one tablet by mouth daily   metoprolol succinate (TOPROL-XL) 25 MG 24 hr tablet Take one tablet daily   Multiple Vitamin (MULTI-VITAMIN DAILY PO) Take by mouth.   pantoprazole (PROTONIX) 40 MG tablet TAKE 1 TABLET(40 MG) BY MOUTH DAILY   potassium chloride (KLOR-CON) 10 MEQ tablet TAKE 1 TABLET(10 MEQ) BY MOUTH DAILY   sertraline (ZOLOFT) 50 MG tablet TAKE 1 TABLET(50 MG) BY MOUTH DAILY   No facility-administered encounter medications on file as of 05/27/2021.    Allergies (verified) Erythromycin, Aspirin, and Ibuprofen   History: Past Medical History:  Diagnosis Date   Depression    H/O hiatal hernia    Hernia    Hernia    right inguinal hernia   Hypertension    Hypothyroidism    Leg swelling    Osteitis pubis [facility]) July 2002   Osteopenia May 2010   Thyroid disease    Past Surgical History:  Procedure Laterality Date   ABDOMINAL HYSTERECTOMY  03/1970   APPENDECTOMY  03/1966   BILATERAL SALPINGOOPHORECTOMY     CATARACT EXTRACTION W/PHACO Right 09/25/2014   Procedure: CATARACT EXTRACTION PHACO AND INTRAOCULAR LENS PLACEMENT ([place]);  Surgeon: Elta Guadeloupe T. Gershon Crane, MD;  Location: [facility];  Service: Ophthalmology;  Laterality: Right;  CDE:7.56   CATARACT EXTRACTION W/PHACO Left 10/09/2014   Procedure: CATARACT EXTRACTION PHACO AND INTRAOCULAR LENS PLACEMENT LEFT EYE CDE=6.51;  Surgeon: Elta Guadeloupe T. Gershon Crane, MD;  Location: [facility];  Service: Ophthalmology;  Laterality: Left;   [place]   COLONOSCOPY  March 2007   Repeat 10 years   COLONOSCOPY  11/06/2003    Internal and external hemorrhoids/Two tiny rectal distal sigmoid polyps cold biopsied/Otherwise within normal limits to the cecum   COLONOSCOPY N/A 08/29/2014   Dr. Gala Romney: Sessile polyp was found at the cecum status post cold snare  polypectomy, tubular adenoma. No further surveillance unless clinical status changes   ESOPHAGOGASTRODUODENOSCOPY  2007   Dr. Gala Romney: normal esophagus, small tom oderate sized hiatal hernia, [place] lesions   SKIN CANCER EXCISION Left 11 1 2016   left arm squamous cancer   TUMOR REMOVAL  03/1968   ovarian   Family History  Problem Relation Age of Onset   Dementia Mother    Heart disease Father    Cancer Father        throat   Cancer Sister        breast   Colon cancer Neg Hx    Social History   Socioeconomic History   Marital status: Married    Spouse name: Not on file   Number of children: Not on file   Years of education: Not on file   Highest education level: Not on file  Occupational History   Occupation: Retired    Fish farm manager: RETIRED    Comment: Orthoptist, retired in [place] Use   Smoking status: Never   Smokeless tobacco: Never   Tobacco comments:    Never smoked  Substance and Sexual Activity   Alcohol use: Yes    Alcohol/week: 0.0 standard drinks    Comment: occasional margarita   Drug use: No   Sexual activity: Yes    Birth control/protection: Surgical  Other Topics Concern   Not on file  Social History Narrative   Not on file   Social Determinants of Health   Financial Resource Strain: Low Risk    Difficulty of Paying Living Expenses: Not hard at all  Food Insecurity: No Food Insecurity   Worried About Charity fundraiser in the Last Year: Never true   [place] in the Last Year: Never true  Transportation Needs: No Transportation Needs   Lack of Transportation (Medical): No   Lack of Transportation (Non-Medical): No  Physical Activity: Sufficiently Active   Days of Exercise per Week: 5 days   Minutes of Exercise per Session: 30 min  Stress: No Stress Concern Present   Feeling of Stress : Only a little  Social Connections: Engineer, building services of Communication with Friends and Family: More than three times a week   Frequency of  Social Gatherings with Friends and Family: More than three times a week   Attends Religious Services: 1 to 4 times per year   Active Member of [employer] or Organizations: Yes   Attends Archivist Meetings: 1 to 4 times per year   Marital Status: Married    Tobacco Counseling Counseling given: Not Answered Tobacco comments: Never smoked   Clinical Intake:  Pre-visit preparation completed: Yes  Pain : No/denies pain     Nutritional Risks: Nausea/ vomitting/ diarrhea (diarrhea) Diabetes: No  How often do you need to have someone help you when you read instructions, pamphlets, or other written materials from your doctor or pharmacy?: 1 - Never  Diabetic?No  Interpreter Needed?: No  Information entered by :: [place] of Daily Living In your present state of health, do  you have any difficulty performing the following activities: 05/27/2021  Hearing? Y  Vision? N  Difficulty concentrating or making decisions? N  Walking or climbing stairs? N  Dressing or bathing? N  Doing errands, shopping? N  Preparing Food and eating ? N  Using the Toilet? N  In the past six months, have you accidently leaked urine? Y  Comment has occassional leakage  Do you have problems with loss of bowel control? N  Managing your Medications? N  Managing your Finances? N  Housekeeping or managing your Housekeeping? N  Some recent data might be hidden    Patient Care Team: Kathyrn Drown, MD as PCP - General (Family Medicine) Gala Romney, Cristopher Estimable, MD as Consulting Physician (Gastroenterology)  Indicate any recent Medical Services you may have received from other than [facility] providers in the past year (date may be approximate).     Assessment:   This is a routine wellness examination for Tyresa.  Hearing/Vision screen Vision Screening - Comments:: Patient states gets eye exams once per year. Currently wears reading glasses   Dietary issues and exercise activities  discussed: Current Exercise Habits: Home exercise routine, Type of exercise: walking, Time (Minutes): 25, Frequency (Times/Week): 5, Weekly Exercise (Minutes/Week): 125, Intensity: Mild, Exercise limited by: None identified   Goals Addressed             This Visit's Progress    DIET - REDUCE SUGAR INTAKE       Prevent falls       Weight (lb) < 175 lb (79.4 kg)         Depression Screen PHQ 2/9 Scores 05/27/2021 10/21/2020 09/06/2018 05/12/2018 03/31/2018 04/28/2017 04/20/2016  PHQ - 2 Score 0 2 0 0 1 0 0  PHQ- 9 Score - 8 1 3  - - -    Fall Risk Fall Risk  05/27/2021 04/21/2021 10/21/2020 05/03/2020 11/08/2019  Falls in the past year? 0 0 0 1 0  Comment - - - - -  Number falls in past yr: 0 - - 0 -  Injury with Fall? 0 - - 0 -  Risk for fall due to : No Fall Risks No Fall Risks - History of fall(s) -  Follow up Falls evaluation completed;Falls prevention discussed Falls evaluation completed Falls evaluation completed Falls evaluation completed;Education provided Falls evaluation completed    FALL RISK PREVENTION PERTAINING TO THE HOME:  Any stairs in or around the home? Yes  If so, are there any without handrails? No  Home free of loose throw rugs in walkways, pet beds, electrical cords, etc? Yes  Adequate lighting in your home to reduce risk of falls? Yes   ASSISTIVE DEVICES UTILIZED TO PREVENT FALLS:  Life alert? No  Use of a cane, walker or w/c? No  Grab bars in the bathroom? No  Shower chair or bench in shower? No  Elevated toilet seat or a handicapped toilet? Yes     Cognitive Function:  Normal cognitive status assessed by direct observation by this Nurse Health Advisor. No abnormalities found.        Immunizations Immunization History  Administered Date(s) Administered   Fluad Quad(high Dose 65+) 09/24/2020   Influenza,inj,Quad PF,6+ Mos 09/06/2018   Influenza-Unspecified 08/31/2015, 08/06/2016, 09/16/2017, 10/19/2019   PFIZER(Purple Top)SARS-COV-2 Vaccination  12/04/2019, 12/26/2019   Pneumococcal Conjugate-13 06/19/2014   Pneumococcal Polysaccharide-23 10/05/2017   Zoster, Live 10/05/2012    TDAP status: Due, Education has been provided regarding the importance of this vaccine. Advised may receive this vaccine  at local pharmacy or Health Dept. Aware to provide a copy of the vaccination record if obtained from local pharmacy or Health Dept. Verbalized acceptance and understanding.  Flu Vaccine status: Up to date  Pneumococcal vaccine status: Up to date  Covid-19 vaccine status: Completed vaccines  Qualifies for Shingles Vaccine? Yes   Zostavax completed Yes   Shingrix Completed?: No.    Education has been provided regarding the importance of this vaccine. Patient has been advised to call insurance company to determine out of pocket expense if they have not yet received this vaccine. Advised may also receive vaccine at local pharmacy or Health Dept. Verbalized acceptance and understanding.  Screening Tests Health Maintenance  Topic Date Due   TETANUS/TDAP  Never done   Zoster Vaccines- Shingrix (1 of 2) Never done   COVID-19 Vaccine (3 - Pfizer risk series) 01/23/2020   INFLUENZA VACCINE  06/30/2021   DEXA SCAN  Completed   PNA vac Low Risk Adult  Completed   HPV VACCINES  Aged Out    Health Maintenance  Health Maintenance Due  Topic Date Due   TETANUS/TDAP  Never done   Zoster Vaccines- Shingrix (1 of 2) Never done   COVID-19 Vaccine (3 - Pfizer risk series) 01/23/2020    Colorectal cancer screening: No longer required.   Mammogram status: Completed 04/23/2021. Repeat every year  Bone Density status: Completed 06/06/2020. Results reflect: Bone density results: OSTEOPENIA. Repeat every 2 years.  Lung Cancer Screening: (Low Dose CT Chest recommended if Age 9-80 years, 30 pack-year currently smoking OR have quit w/in 15years.) does not qualify.   Lung Cancer Screening Referral: N/A  Additional Screening:  Hepatitis C  Screening: does not qualify;   Vision Screening: Recommended annual ophthalmology exams for early detection of glaucoma and other disorders of the eye. Is the patient up to date with their annual eye exam?  Yes  Who is the provider or what is the name of the office in which the patient attends annual eye exams? Dr. Rutherford Guys  If pt is not established with a provider, would they like to be referred to a provider to establish care? No .   Dental Screening: Recommended annual dental exams for proper oral hygiene  Community Resource Referral / Chronic Care Management: CRR required this visit?  No   CCM required this visit?  No      Plan:     I have personally reviewed and noted the following in the patient's chart:   Medical and social history Use of alcohol, tobacco or illicit drugs  Current medications and supplements including opioid prescriptions.  Functional ability and status Nutritional status Physical activity Advanced directives List of other physicians Hospitalizations, surgeries, and ER visits in previous 12 months Vitals Screenings to include cognitive, depression, and falls Referrals and appointments  In addition, I have reviewed and discussed with patient certain preventive protocols, quality metrics, and best practice recommendations. A written personalized care plan for preventive services as well as general preventive health recommendations were provided to patient.     Ofilia Neas, LPN   5/37/4827   Nurse Notes: None I have reviewed and agree with the above annual wellness visit documentation Sallee Lange MD [facility]

## 2021-05-27 ENCOUNTER — Other Ambulatory Visit: Payer: Self-pay

## 2021-05-27 ENCOUNTER — Ambulatory Visit (INDEPENDENT_AMBULATORY_CARE_PROVIDER_SITE_OTHER): Payer: PPO

## 2021-05-27 DIAGNOSIS — Z0000 Encounter for general adult medical examination without abnormal findings: Secondary | ICD-10-CM | POA: Diagnosis not present

## 2021-05-27 NOTE — Patient Instructions (Signed)
[name] , Thank you for taking time to come for your Medicare Wellness Visit. I appreciate your ongoing commitment to your health goals. Please review the following plan we discussed and let me know if I can assist you in the future.   Screening recommendations/referrals: Colonoscopy: No longer required  Mammogram: No longer required  Bone Density: No longer required  Recommended yearly ophthalmology/optometry visit for glaucoma screening and checkup Recommended yearly dental visit for hygiene and checkup  Vaccinations: Influenza vaccine: Up to date, next due fall 2022  Pneumococcal vaccine: Completed series  Tdap vaccine: Currently due, you may await and injury to receive  Shingles vaccine: Currently due for Shingrix, if you would like to receive we recommend that you do so at your local pharmacy    Advanced directives: Please bring in copies of your advanced medical directives so that we may scan them into your chart.  Conditions/risks identified: None   Next appointment: None    Preventive Care 65 Years and Older, Female Preventive care refers to lifestyle choices and visits with your health care provider that can promote health and wellness. What does preventive care include? A yearly physical exam. This is also called an annual well check. Dental exams once or twice a year. Routine eye exams. Ask your health care provider how often you should have your eyes checked. Personal lifestyle choices, including: Daily care of your teeth and gums. Regular physical activity. Eating a healthy diet. Avoiding tobacco and drug use. Limiting alcohol use. Practicing safe sex. Taking low-dose aspirin every day. Taking vitamin and mineral supplements as recommended by your health care provider. What happens during an annual well check? The services and screenings done by your health care provider during your annual well check will depend on your age, overall health, lifestyle risk  factors, and family history of disease. Counseling  Your health care provider may ask you questions about your: Alcohol use. Tobacco use. Drug use. Emotional well-being. Home and relationship well-being. Sexual activity. Eating habits. History of falls. Memory and ability to understand (cognition). Work and work Statistician. Reproductive health. Screening  You may have the following tests or measurements: Height, weight, and BMI. Blood pressure. Lipid and cholesterol levels. These may be checked every 5 years, or more frequently if you are over 64 years old. Skin check. Lung cancer screening. You may have this screening every year starting at age 43 if you have a 30-pack-year history of smoking and currently smoke or have quit within the past 15 years. Fecal occult blood test (FOBT) of the stool. You may have this test every year starting at age 3. Flexible sigmoidoscopy or colonoscopy. You may have a sigmoidoscopy every 5 years or a colonoscopy every 10 years starting at age 30. Hepatitis C blood test. Hepatitis B blood test. Sexually transmitted disease (STD) testing. Diabetes screening. This is done by checking your blood sugar (glucose) after you have not eaten for a while (fasting). You may have this done every 1-3 years. Bone density scan. This is done to screen for osteoporosis. You may have this done starting at age 80. Mammogram. This may be done every 1-2 years. Talk to your health care provider about how often you should have regular mammograms. Talk with your health care provider about your test results, treatment options, and if necessary, the need for more tests. Vaccines  Your health care provider may recommend certain vaccines, such as: Influenza vaccine. This is recommended every year. Tetanus, diphtheria, and acellular pertussis (Tdap, Td)  vaccine. You may need a Td booster every 10 years. Zoster vaccine. You may need this after age 63. Pneumococcal 13-valent  conjugate (PCV13) vaccine. One dose is recommended after age 65. Pneumococcal polysaccharide (PPSV23) vaccine. One dose is recommended after age 87. Talk to your health care provider about which screenings and vaccines you need and how often you need them. This information is not intended to replace advice given to you by your health care provider. Make sure you discuss any questions you have with your health care provider. Document Released: 12/13/2015 Document Revised: 08/05/2016 Document Reviewed: 09/17/2015 Elsevier Interactive Patient Education  2017 [place] Prevention in the Home Falls can cause injuries. They can happen to people of all ages. There are many things you can do to make your home safe and to help prevent falls. What can I do on the outside of my home? Regularly fix the edges of walkways and driveways and fix any cracks. Remove anything that might make you trip as you walk through a door, such as a raised step or threshold. Trim any bushes or trees on the path to your home. Use bright outdoor lighting. Clear any walking paths of anything that might make someone trip, such as rocks or tools. Regularly check to see if handrails are loose or broken. Make sure that both sides of any steps have handrails. Any raised decks and porches should have guardrails on the edges. Have any leaves, snow, or ice cleared regularly. Use sand or salt on walking paths during winter. Clean up any spills in your garage right away. This includes oil or grease spills. What can I do in the bathroom? Use night lights. Install grab bars by the toilet and in the tub and shower. Do not use towel bars as grab bars. Use non-skid mats or decals in the tub or shower. If you need to sit down in the shower, use a plastic, non-slip stool. Keep the floor dry. Clean up any water that spills on the floor as soon as it happens. Remove soap buildup in the tub or shower regularly. Attach bath mats  securely with double-sided non-slip rug tape. Do not have throw rugs and other things on the floor that can make you trip. What can I do in the bedroom? Use night lights. Make sure that you have a light by your bed that is easy to reach. Do not use any sheets or blankets that are too big for your bed. They should not hang down onto the floor. Have a firm chair that has side arms. You can use this for support while you get dressed. Do not have throw rugs and other things on the floor that can make you trip. What can I do in the kitchen? Clean up any spills right away. Avoid walking on wet floors. Keep items that you use a lot in easy-to-reach places. If you need to reach something above you, use a strong step stool that has a grab bar. Keep electrical cords out of the way. Do not use floor polish or wax that makes floors slippery. If you must use wax, use non-skid floor wax. Do not have throw rugs and other things on the floor that can make you trip. What can I do with my stairs? Do not leave any items on the stairs. Make sure that there are handrails on both sides of the stairs and use them. Fix handrails that are broken or loose. Make sure that handrails are as long  as the stairways. Check any carpeting to make sure that it is firmly attached to the stairs. Fix any carpet that is loose or worn. Avoid having throw rugs at the top or bottom of the stairs. If you do have throw rugs, attach them to the floor with carpet tape. Make sure that you have a light switch at the top of the stairs and the bottom of the stairs. If you do not have them, ask someone to add them for you. What else can I do to help prevent falls? Wear shoes that: Do not have high heels. Have rubber bottoms. Are comfortable and fit you well. Are closed at the toe. Do not wear sandals. If you use a stepladder: Make sure that it is fully opened. Do not climb a closed stepladder. Make sure that both sides of the stepladder  are locked into place. Ask someone to hold it for you, if possible. Clearly mark and make sure that you can see: Any grab bars or handrails. First and last steps. Where the edge of each step is. Use tools that help you move around (mobility aids) if they are needed. These include: Canes. Walkers. Scooters. Crutches. Turn on the lights when you go into a dark area. Replace any light bulbs as soon as they burn out. Set up your furniture so you have a clear path. Avoid moving your furniture around. If any of your floors are uneven, fix them. If there are any pets around you, be aware of where they are. Review your medicines with your doctor. Some medicines can make you feel dizzy. This can increase your chance of falling. Ask your doctor what other things that you can do to help prevent falls. This information is not intended to replace advice given to you by your health care provider. Make sure you discuss any questions you have with your health care provider. Document Released: 09/12/2009 Document Revised: 04/23/2016 Document Reviewed: 12/21/2014 Elsevier Interactive Patient Education  2017 [employer].

## 2021-06-03 DIAGNOSIS — E038 Other specified hypothyroidism: Secondary | ICD-10-CM | POA: Diagnosis not present

## 2021-06-03 DIAGNOSIS — D72829 Elevated white blood cell count, unspecified: Secondary | ICD-10-CM | POA: Diagnosis not present

## 2021-06-03 DIAGNOSIS — I10 Essential (primary) hypertension: Secondary | ICD-10-CM | POA: Diagnosis not present

## 2021-06-03 DIAGNOSIS — N289 Disorder of kidney and ureter, unspecified: Secondary | ICD-10-CM | POA: Diagnosis not present

## 2021-06-04 LAB — CBC WITH DIFFERENTIAL/PLATELET
Basophils Absolute: 0.1 10*3/uL (ref 0.0–0.2)
Basos: 1 %
EOS (ABSOLUTE): 0.2 10*3/uL (ref 0.0–0.4)
Eos: 1 %
Hematocrit: 39.1 % (ref 34.0–46.6)
Hemoglobin: 12.5 g/dL (ref 11.1–15.9)
Immature Grans (Abs): 0 10*3/uL (ref 0.0–0.1)
Immature Granulocytes: 0 %
Lymphocytes Absolute: 6.9 10*3/uL — ABNORMAL HIGH (ref 0.7–3.1)
Lymphs: 50 %
MCH: 29.3 pg (ref 26.6–33.0)
MCHC: 32 g/dL (ref 31.5–35.7)
MCV: 92 fL (ref 79–97)
Monocytes Absolute: 0.8 10*3/uL (ref 0.1–0.9)
Monocytes: 6 %
Neutrophils Absolute: 5.8 10*3/uL (ref 1.4–7.0)
Neutrophils: 42 %
Platelets: 427 10*3/uL (ref 150–450)
RBC: 4.26 x10E6/uL (ref 3.77–5.28)
RDW: 12.8 % (ref 11.7–15.4)
WBC: 13.7 10*3/uL — ABNORMAL HIGH (ref 3.4–10.8)

## 2021-06-04 LAB — BASIC METABOLIC PANEL
BUN/Creatinine Ratio: 15 (ref 12–28)
BUN: 16 mg/dL (ref 8–27)
CO2: 25 mmol/L (ref 20–29)
Calcium: 9.2 mg/dL (ref 8.7–10.3)
Chloride: 100 mmol/L (ref 96–106)
Creatinine, Ser: 1.05 mg/dL — ABNORMAL HIGH (ref 0.57–1.00)
Glucose: 92 mg/dL (ref 65–99)
Potassium: 3.7 mmol/L (ref 3.5–5.2)
Sodium: 139 mmol/L (ref 134–144)
eGFR: 53 mL/min/{1.73_m2} — ABNORMAL LOW (ref >59)

## 2021-06-06 ENCOUNTER — Other Ambulatory Visit: Payer: Self-pay

## 2021-06-06 ENCOUNTER — Ambulatory Visit (INDEPENDENT_AMBULATORY_CARE_PROVIDER_SITE_OTHER): Payer: PPO | Admitting: Family Medicine

## 2021-06-06 ENCOUNTER — Encounter: Payer: Self-pay | Admitting: Family Medicine

## 2021-06-06 VITALS — BP 136/84 | Temp 94.1°F | Wt 192.0 lb

## 2021-06-06 DIAGNOSIS — D72829 Elevated white blood cell count, unspecified: Secondary | ICD-10-CM | POA: Diagnosis not present

## 2021-06-06 DIAGNOSIS — N289 Disorder of kidney and ureter, unspecified: Secondary | ICD-10-CM | POA: Diagnosis not present

## 2021-06-06 DIAGNOSIS — E038 Other specified hypothyroidism: Secondary | ICD-10-CM | POA: Diagnosis not present

## 2021-06-06 NOTE — Patient Instructions (Signed)
We will set up a referral to Dr.K

## 2021-06-06 NOTE — Progress Notes (Signed)
   Subjective:    Patient ID: [name], female    DOB: [birth year], 85 y.o.   MRN: [id number]  HPI Pt here for follow up on blood pressure. Pt was seen 04/21/21. Pt states she is doing alright. Pt husband has cancer and dementia and she is dealing with that.   Blood pressure has been a little high; no issues.  Pt doing ok on 100 mg Zoloft.  Patient moods have been doing okay Recently found out her husband has kidney cancer as well as possible lung cancer  Review of Systems     Objective:   Physical Exam  General-in no acute distress Eyes-no discharge Lungs-respiratory rate normal, CTA CV-no murmurs,RRR Extremities skin warm dry no edema Neuro grossly normal Behavior normal, alert       Assessment & Plan:  1. Other specified hypothyroidism Continue thyroid medication.  No need to do lab work currently  2. Renal insufficiency Mild renal insufficiency watch diet closely stay physically active try to exercise on a regular basis vegetables and fruits Will reduce her indapamide 1.25 mg 3. Leukocytosis, unspecified type Persistent leukocytosis with white count going up referral to hematology could be CLL - Ambulatory referral to Hematology / Oncology  Recent stress denies being depressed but is on Zoloft continue Zoloft currently.  Blood pressure good control continue current measures  Uses Xanax intermittently  We will see the patient back in 4 months

## 2021-06-11 MED ORDER — POTASSIUM CHLORIDE CRYS ER 10 MEQ PO TBCR: EXTENDED_RELEASE_TABLET | ORAL | 1 refills | Status: DC

## 2021-06-11 MED ORDER — INDAPAMIDE 1.25 MG PO TABS: ORAL_TABLET | ORAL | 1 refills | Status: DC

## 2021-06-11 NOTE — Progress Notes (Signed)
06/11/21-Pt contacted and decreased dose sent to pharmacy. Refill of potassium also sent in. Pt verbalized understanding.

## 2021-06-11 NOTE — Addendum Note (Signed)
Addended by: [name] on: 06/11/2021 01:55 PM   Modules accepted: Orders

## 2021-06-19 ENCOUNTER — Other Ambulatory Visit: Payer: Self-pay | Admitting: Family Medicine

## 2021-07-28 ENCOUNTER — Ambulatory Visit (INDEPENDENT_AMBULATORY_CARE_PROVIDER_SITE_OTHER): Payer: PPO | Admitting: Family Medicine

## 2021-07-28 ENCOUNTER — Other Ambulatory Visit: Payer: Self-pay

## 2021-07-28 ENCOUNTER — Telehealth: Payer: Self-pay | Admitting: Family Medicine

## 2021-07-28 DIAGNOSIS — U071 COVID-19: Secondary | ICD-10-CM | POA: Diagnosis not present

## 2021-07-28 MED ORDER — NIRMATRELVIR/RITONAVIR (PAXLOVID)TABLET: 3.0000 | ORAL_TABLET | Freq: Two times a day (BID) | ORAL | 0 refills | Status: AC

## 2021-07-28 NOTE — Telephone Encounter (Signed)
Spoke with patient states feeling better overall, no SOB or trouble breathing. Patient states had a recent death of her loved one last week, and has cough and some nasal congestion. Offered a telephone appt tomorrow .

## 2021-07-28 NOTE — Telephone Encounter (Signed)
Patient tested positive for Covid 8/28 with cough,congestion,fever. The symptoms started on 8/27.[facility]

## 2021-07-28 NOTE — Progress Notes (Addendum)
   Subjective:  Telephone only unable to do video  Patient ID: [name], female    DOB: 06-18-1936, 85 y.o.   MRN: [id number]  HPI Patient tested positive for Covid 8/28 with cough,congestion,fever. The symptoms started on 8/27 patient states feeling better overall, no SOB or trouble breathing. Patient states had a recent death of her loved one last week, and has cough and some nasal congestion. Patient started to feel better today no shortness of breath today.  Felt bad Saturday Sunday Monday morning but starting to feel better Virtual Visit via Telephone Note 15 minutes spent with patient discussing her issue and also documentation I connected with Gayleen Orem on 07/28/21 at  4:30 PM EDT by telephone and verified that I am speaking with the correct person using two identifiers.  Location: Patient: home Provider: office   I discussed the limitations, risks, security and privacy concerns of performing an evaluation and management service by telephone and the availability of in person appointments. I also discussed with the patient that there may be a patient responsible charge related to this service. The patient expressed understanding and agreed to proceed.   History of Present Illness:    Observations/Objective:   Assessment and Plan:   Follow Up Instructions:    I discussed the assessment and treatment plan with the patient. The patient was provided an opportunity to ask questions and all were answered. The patient agreed with the plan and demonstrated an understanding of the instructions.   The patient was advised to call back or seek an in-person evaluation if the symptoms worsen or if the condition fails to improve as anticipated.  I provided  minutes of non-face-to-face time during this encounter.      Review of Systems     Objective:   Physical Exam  Today's visit was via telephone Physical exam was not possible for this visit       Assessment &  Plan:  We did discuss the risk factors associated with COVID We also discussed complications that could occur Go ahead with Paxlovid There does not appear to be any drug interactions that I can tell Her kidney function overall looks good She will utilize 3 pills twice daily for 5 days If unable to tolerate or having progressive symptoms will notify us Warning signs regarding COVID was discussed

## 2021-07-28 NOTE — Telephone Encounter (Signed)
[name], kawa are scheduled for a virtual visit with your provider today.    Just as we do with appointments in the office, we must obtain your consent to participate.  Your consent will be active for this visit and any virtual visit you may have with one of our providers in the next 365 days.    If you have a MyChart account, I can also send a copy of this consent to you electronically.  All virtual visits are billed to your insurance company just like a traditional visit in the office.  As this is a virtual visit, video technology does not allow for your provider to perform a traditional examination.  This may limit your provider's ability to fully assess your condition.  If your provider identifies any concerns that need to be evaluated in person or the need to arrange testing such as labs, EKG, etc, we will make arrangements to do so.    Although advances in technology are sophisticated, we cannot ensure that it will always work on either your end or our end.  If the connection with a video visit is poor, we may have to switch to a telephone visit.  With either a video or telephone visit, we are not always able to ensure that we have a secure connection.   I need to obtain your verbal consent now.   Are you willing to proceed with your visit today?   [name] has provided verbal consent on 07/28/2021 for a virtual visit (video or telephone).   [name], LPN [id number]  [id number]

## 2021-07-29 ENCOUNTER — Telehealth: Payer: PPO | Admitting: Family Medicine

## 2021-08-15 ENCOUNTER — Telehealth: Payer: Self-pay | Admitting: Family Medicine

## 2021-08-15 DIAGNOSIS — D72829 Elevated white blood cell count, unspecified: Secondary | ICD-10-CM

## 2021-08-17 NOTE — Telephone Encounter (Signed)
See previous note from July Needs referral for Dr. Delton Coombes for leukocytosis thank you

## 2021-08-18 NOTE — Telephone Encounter (Signed)
Referral ordered in Epic. 

## 2021-08-21 ENCOUNTER — Encounter: Payer: Self-pay | Admitting: Emergency Medicine

## 2021-08-21 ENCOUNTER — Ambulatory Visit
Admission: EM | Admit: 2021-08-21 | Discharge: 2021-08-21 | Disposition: A | Discharge status: Home / Self Care | Payer: PPO

## 2021-08-21 ENCOUNTER — Other Ambulatory Visit: Payer: Self-pay

## 2021-08-21 DIAGNOSIS — H1132 Conjunctival hemorrhage, left eye: Secondary | ICD-10-CM | POA: Diagnosis not present

## 2021-08-21 NOTE — Discharge Instructions (Signed)
You may use OTC eye drops as needed Follow up with PCP as needed Symptoms should improve over the next few weeks, with complete resolution within 4- 6 weeks Return or follow up with PCP if symptoms persists such as fever, chills, redness, swelling, eye pain, painful eye movements, vision changes, etc..Marland Kitchen

## 2021-08-21 NOTE — ED Triage Notes (Signed)
Left eye red since yesterday

## 2021-08-21 NOTE — ED Provider Notes (Signed)
[name]   [id number] 08/21/21 Arrival Time: 1435  CC: Red eye  SUBJECTIVE:  [name] is a 85 y.o. female who presents with complaint of LT eye redness that started 1 day ago  Had covid last month and reports coughing.  Denies alleviating or aggravating factors.  Reports similar symptoms in the past.  Denies fever, chills, nausea, vomiting, eye pain, painful eye movements, discharge, itching, vision changes, double vision, FB sensation, periorbital erythema, CP, SOB, weakness, slurred speech, abnormal bruising, bleeding gums, blood in stool or urine.     ROS: As per HPI.  All other pertinent ROS negative.     Past Medical History:  Diagnosis Date   Depression    H/O hiatal hernia    Hernia    Hernia    right inguinal hernia   Hypertension    Hypothyroidism    Leg swelling    Osteitis pubis [name]) July 2002   Osteopenia May 2010   Thyroid disease    Past Surgical History:  Procedure Laterality Date   ABDOMINAL HYSTERECTOMY  03/1970   APPENDECTOMY  03/1966   BILATERAL SALPINGOOPHORECTOMY     CATARACT EXTRACTION W/PHACO Right 09/25/2014   Procedure: CATARACT EXTRACTION PHACO AND INTRAOCULAR LENS PLACEMENT ([name]);  Surgeon: Elta Guadeloupe T. Gershon Crane, MD;  Location: [name];  Service: Ophthalmology;  Laterality: Right;  CDE:7.56   CATARACT EXTRACTION W/PHACO Left 10/09/2014   Procedure: CATARACT EXTRACTION PHACO AND INTRAOCULAR LENS PLACEMENT LEFT EYE CDE=6.51;  Surgeon: Elta Guadeloupe T. Gershon Crane, MD;  Location: [name];  Service: Ophthalmology;  Laterality: Left;   [name]   COLONOSCOPY  March 2007   Repeat 10 years   COLONOSCOPY  11/06/2003    Internal and external hemorrhoids/Two tiny rectal distal sigmoid polyps cold biopsied/Otherwise within normal limits to the cecum   COLONOSCOPY N/A 08/29/2014   Dr. Gala Romney: Sessile polyp was found at the cecum status post cold snare polypectomy, tubular adenoma. No further surveillance unless clinical status  changes   ESOPHAGOGASTRODUODENOSCOPY  2007   Dr. Gala Romney: normal esophagus, small tom oderate sized hiatal hernia, Cameron lesions   SKIN CANCER EXCISION Left 11 1 2016   left arm squamous cancer   TUMOR REMOVAL  03/1968   ovarian   Allergies  Allergen Reactions   Erythromycin Shortness Of Breath   Aspirin Other (See Comments)    Caused internal bleeding. Patient had ulcer at the time. Instructed by PCP - Darral Dash not to take any longer.   Ibuprofen     Instructed not to take by PCP - Darral Dash due to reaction to aspirin as well (precautionary).   No current facility-administered medications on file prior to encounter.   Current Outpatient Medications on File Prior to Encounter  Medication Sig Dispense Refill   alendronate (FOSAMAX) 70 MG tablet TAKE 1 TABLET(70 MG) BY MOUTH EVERY 7 DAYS WITH A FULL GLASS OF WATER AND ON AN EMPTY STOMACH 4 tablet 5   ALPRAZolam (XANAX) 0.5 MG tablet TAKE 1 TABLET(0.5 MG) BY MOUTH TWICE DAILY AS NEEDED 60 tablet 4   Cholecalciferol (VITAMIN D3) 2000 UNITS TABS Take 2 tablets by mouth daily.      indapamide (LOZOL) 1.25 MG tablet Take one tablet po every morning 90 tablet 1   levothyroxine (SYNTHROID) 125 MCG tablet Take one tablet by mouth daily 90 tablet 1   metoprolol succinate (TOPROL-XL) 25 MG 24 hr tablet Take one tablet daily 90 tablet 1   Multiple Vitamin (MULTI-VITAMIN DAILY  PO) Take by mouth.     pantoprazole (PROTONIX) 40 MG tablet TAKE 1 TABLET(40 MG) BY MOUTH DAILY 90 tablet 1   potassium chloride (KLOR-CON) 10 MEQ tablet TAKE 1 TABLET(10 MEQ) BY MOUTH DAILY 90 tablet 1   sertraline (ZOLOFT) 50 MG tablet TAKE 1 TABLET(50 MG) BY MOUTH DAILY 90 tablet 1   Social History   Socioeconomic History   Marital status: Married    Spouse name: Not on file   Number of children: Not on file   Years of education: Not on file   Highest education level: Not on file  Occupational History   Occupation: Retired    Fish farm manager: RETIRED    Comment: Orthoptist,  retired in [place] Use   Smoking status: Never   Smokeless tobacco: Never   Tobacco comments:    Never smoked  Substance and Sexual Activity   Alcohol use: Yes    Alcohol/week: 0.0 standard drinks    Comment: occasional margarita   Drug use: No   Sexual activity: Yes    Birth control/protection: Surgical  Other Topics Concern   Not on file  Social History Narrative   Not on file   Social Determinants of Health   Financial Resource Strain: Low Risk    Difficulty of Paying Living Expenses: Not hard at all  Food Insecurity: No Food Insecurity   Worried About Charity fundraiser in the Last Year: Never true   [place] in the Last Year: Never true  Transportation Needs: No Transportation Needs   Lack of Transportation (Medical): No   Lack of Transportation (Non-Medical): No  Physical Activity: Sufficiently Active   Days of Exercise per Week: 5 days   Minutes of Exercise per Session: 30 min  Stress: No Stress Concern Present   Feeling of Stress : Only a little  Social Connections: Engineer, building services of Communication with Friends and Family: More than three times a week   Frequency of Social Gatherings with Friends and Family: More than three times a week   Attends Religious Services: 1 to 4 times per year   Active Member of [employer] or Organizations: Yes   Attends Archivist Meetings: 1 to 4 times per year   Marital Status: Married  Human resources officer Violence: Not At Risk   Fear of Current or Ex-Partner: No   Emotionally Abused: No   Physically Abused: No   Sexually Abused: No   Family History  Problem Relation Age of Onset   Dementia Mother    Heart disease Father    Cancer Father        throat   Cancer Sister        breast   Colon cancer Neg Hx     OBJECTIVE:   Vitals:   08/21/21 1514  BP: (!) 162/76  Pulse: 75  Resp: 16  Temp: 98.8 F (37.1 C)  TempSrc: Oral  SpO2: 98%    General appearance: alert; no distress Eyes:  LT subconjunctival hemorrhage in 7-11 o'clock. PERRL; EOMI without discomfort;  no obvious drainage Neck: supple Lungs: normal respiratory effort Skin: warm and dry Psychological: alert and cooperative; normal mood and affect  ASSESSMENT & PLAN:  1. Non-traumatic subconjunctival hemorrhage of left eye    You may use OTC eye drops as needed Follow up with PCP as needed Symptoms should improve over the next few weeks, with complete resolution within 4- 6 weeks Return or follow up with PCP if  symptoms persists such as fever, chills, redness, swelling, eye pain, painful eye movements, vision changes, etc...   Reviewed expectations re: course of current medical issues. Questions answered. Outlined signs and symptoms indicating need for more acute intervention. Patient verbalized understanding. After Visit Summary given.    Lestine Box, PA-C 08/21/21 1542

## 2021-09-04 NOTE — Progress Notes (Signed)
[facility] [address] [place] [address]   CLINIC:  Medical Oncology/Hematology  Patient Care Team: Kathyrn Drown, MD as PCP - General (Family Medicine) Gala Romney, Cristopher Estimable, MD as Consulting Physician (Gastroenterology) Derek Jack, MD as Medical Oncologist (Hematology)  CHIEF COMPLAINTS/PURPOSE OF CONSULTATION:  Evaluation of lymphocytic leukocytosis  HISTORY OF PRESENTING ILLNESS:  [name] 85 y.o. female is here because of evaluation of leukocytosis, at the request of Dr. Wolfgang Phoenix.  Today she reports feeling good. She denies recent infections, rashes, fevers, night sweats, and unintentional weight loss. She is not taking any steroids. She denies history of RA and lupus. She reports a history of skin cancer on her scalp that was removed 3 years ago. She reports ankle swellings that have improved over the past week. She lives at home alone and is able to do all of her typical home activities. Prior to retirement she worked at [employer] Her father had throat cancer, and her sister had breast cancer. She denies family history of leukemia.   MEDICAL HISTORY:  Past Medical History:  Diagnosis Date   Depression    H/O hiatal hernia    Hernia    Hernia    right inguinal hernia   Hypertension    Hypothyroidism    Leg swelling    Osteitis pubis [facility]) July 2002   Osteopenia May 2010   Thyroid disease     SURGICAL HISTORY: Past Surgical History:  Procedure Laterality Date   ABDOMINAL HYSTERECTOMY  03/1970   APPENDECTOMY  03/1966   BILATERAL SALPINGOOPHORECTOMY     CATARACT EXTRACTION W/PHACO Right 09/25/2014   Procedure: CATARACT EXTRACTION PHACO AND INTRAOCULAR LENS PLACEMENT ([place]);  Surgeon: Elta Guadeloupe T. Gershon Crane, MD;  Location: [facility];  Service: Ophthalmology;  Laterality: Right;  CDE:7.56   CATARACT EXTRACTION W/PHACO Left 10/09/2014   Procedure: CATARACT EXTRACTION PHACO AND INTRAOCULAR LENS PLACEMENT LEFT EYE CDE=6.51;  Surgeon: Elta Guadeloupe T. Gershon Crane,  MD;  Location: [facility];  Service: Ophthalmology;  Laterality: Left;   [place]   COLONOSCOPY  March 2007   Repeat 10 years   COLONOSCOPY  11/06/2003    Internal and external hemorrhoids/Two tiny rectal distal sigmoid polyps cold biopsied/Otherwise within normal limits to the cecum   COLONOSCOPY N/A 08/29/2014   Dr. Gala Romney: Sessile polyp was found at the cecum status post cold snare polypectomy, tubular adenoma. No further surveillance unless clinical status changes   ESOPHAGOGASTRODUODENOSCOPY  2007   Dr. 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   Smoking status: Never   Smokeless tobacco: Never   Tobacco comments:    Never smoked  Substance and Sexual Activity   Alcohol use: Yes    Alcohol/week: 0.0 standard drinks    Comment: occasional margarita   Drug use: No   Sexual activity: Yes    Birth control/protection: Surgical  Other Topics Concern  Not on file  Social History Narrative   Not on file   Social Determinants of Health   Financial Resource Strain: Low Risk    Difficulty of Paying Living Expenses: Not hard at all  Food Insecurity: No Food Insecurity   Worried About Charity fundraiser in the Last Year: Never true   Arboriculturist in the Last Year: Never true  Transportation Needs: No Transportation Needs   Lack of Transportation (Medical): No   Lack of Transportation (Non-Medical): No  Physical Activity: Sufficiently Active   Days of Exercise per Week: 5 days    Minutes of Exercise per Session: 30 min  Stress: No Stress Concern Present   Feeling of Stress : Only a little  Social Connections: Engineer, building services of Communication with Friends and Family: More than three times a week   Frequency of Social Gatherings with Friends and Family: More than three times a week   Attends Religious Services: 1 to 4 times per year   Active Member of [employer] or Organizations: Yes   Attends Archivist Meetings: 1 to 4 times per year   Marital Status: Married  Human resources officer Violence: Not At Risk   Fear of Current or Ex-Partner: No   Emotionally Abused: No   Physically Abused: No   Sexually Abused: No    FAMILY HISTORY: Family History  Problem Relation Age of Onset   Dementia Mother    Heart disease Father    Cancer Father        throat   Cancer Sister        breast   Colon cancer Neg Hx     ALLERGIES:  is allergic to erythromycin, aspirin, and ibuprofen.  MEDICATIONS:  Current Outpatient Medications  Medication Sig Dispense Refill   alendronate (FOSAMAX) 70 MG tablet TAKE 1 TABLET(70 MG) BY MOUTH EVERY 7 DAYS WITH A FULL GLASS OF WATER AND ON AN EMPTY STOMACH 4 tablet 5   Cholecalciferol (VITAMIN D3) 2000 UNITS TABS Take 2 tablets by mouth daily.      indapamide (LOZOL) 1.25 MG tablet Take one tablet po every morning 90 tablet 1   levothyroxine (SYNTHROID) 125 MCG tablet Take one tablet by mouth daily 90 tablet 1   metoprolol succinate (TOPROL-XL) 25 MG 24 hr tablet Take one tablet daily 90 tablet 1   Multiple Vitamin (MULTI-VITAMIN DAILY PO) Take by mouth.     pantoprazole (PROTONIX) 40 MG tablet TAKE 1 TABLET(40 MG) BY MOUTH DAILY 90 tablet 1   potassium chloride (KLOR-CON) 10 MEQ tablet TAKE 1 TABLET(10 MEQ) BY MOUTH DAILY 90 tablet 1   sertraline (ZOLOFT) 50 MG tablet TAKE 1 TABLET(50 MG) BY MOUTH DAILY 90 tablet 1   ALPRAZolam (XANAX) 0.5 MG tablet TAKE 1 TABLET(0.5 MG) BY MOUTH TWICE DAILY AS NEEDED (Patient not  taking: Reported on 09/05/2021) 60 tablet 4   No current facility-administered medications for this visit.    REVIEW OF SYSTEMS:   Review of Systems  Constitutional:  Negative for appetite change, fatigue (75%) and fever.  HENT:   Positive for trouble swallowing.   Respiratory:  Positive for cough (dry) and shortness of breath (with exertion).   Cardiovascular:  Positive for leg swelling (ankle).  Genitourinary:  Positive for difficulty urinating (urgency).   Skin:  Negative for rash.  All other systems reviewed and are negative.   PHYSICAL EXAMINATION: ECOG PERFORMANCE STATUS: 1 - Symptomatic but completely ambulatory  Vitals:   09/05/21  1053  BP: (!) 157/71  Pulse: 62  Resp: 18  Temp: 98.1 F (36.7 C)  SpO2: 97%   Filed Weights   09/05/21 1053  Weight: 186 lb 11.2 oz (84.7 kg)   Physical Exam Vitals reviewed.  Constitutional:      Appearance: Normal appearance.  Cardiovascular:     Rate and Rhythm: Normal rate and regular rhythm.     Pulses: Normal pulses.     Heart sounds: Normal heart sounds.  Pulmonary:     Effort: Pulmonary effort is normal.     Breath sounds: Normal breath sounds.  Abdominal:     Palpations: Abdomen is soft. There is no hepatomegaly, splenomegaly or mass.     Tenderness: There is no abdominal tenderness.  Musculoskeletal:     Right lower leg: No edema.     Left lower leg: No edema.  Lymphadenopathy:     Cervical: No cervical adenopathy.     Right cervical: No superficial, deep or posterior cervical adenopathy.    Left cervical: No superficial, deep or posterior cervical adenopathy.     Upper Body:     Right upper body: No supraclavicular, axillary or pectoral adenopathy.     Left upper body: No supraclavicular, axillary or pectoral adenopathy.     Lower Body: No right inguinal adenopathy. No left inguinal adenopathy.  Neurological:     General: No focal deficit present.     Mental Status: She is alert and oriented to person, place, and  time.  Psychiatric:        Mood and Affect: Mood normal.        Behavior: Behavior normal.     LABORATORY DATA:  I have reviewed the data as listed No results found for this or any previous visit (from the past 2160 hour(s)).  RADIOGRAPHIC STUDIES: I have personally reviewed the radiological images as listed and agreed with the findings in the report. No results found.  ASSESSMENT:  Lymphocytic leukocytosis: - Patient seen at the request of Dr. Wolfgang Phoenix for leukocytosis. - CBC on 06/03/2021 white count 13.7, normal hemoglobin and platelets.  Differential showed increased lymphocytosis. - Review of her labs showed increase in absolute lymphocytosis since May 2016. - Ultrasound abdomen on 02/11/2021 showed normal spleen size.  No other abnormalities. - She does not have any B symptoms or recurrent infections.  She was treated for 1 UTI this year.  She is not on steroids.  No skin rashes.  Social/Family history: - She is recently widowed when her husband passed away in 29-Jul-2021.  She lives by herself and does all her ADLs and IADLs.  She used to work in [place] prior to retirement.  She is non-smoker. - Father had throat cancer.  Sister had breast cancer.  No family history of leukemias.   PLAN:  Lymphocytic leukocytosis: - We talked about the differential diagnosis for lymphocytic leukocytosis including the most common cause-CLL. - Recommend repeating CBC today along with LDH. - Recommend flow cytometry on peripheral blood. - RTC 3 weeks for follow-up to discuss results.   All questions were answered. The patient knows to call the clinic with any problems, questions or concerns.  Derek Jack, MD 09/05/21 11:25 AM  [place] [phone]   I, Thana Ates, am acting as a scribe for Dr. Derek Jack.  I, Derek Jack MD, have reviewed the above documentation for accuracy and completeness, and I agree with the above.

## 2021-09-05 ENCOUNTER — Inpatient Hospital Stay (HOSPITAL_COMMUNITY): Payer: PPO | Attending: Hematology | Admitting: Hematology

## 2021-09-05 ENCOUNTER — Inpatient Hospital Stay (HOSPITAL_COMMUNITY): Payer: PPO

## 2021-09-05 ENCOUNTER — Other Ambulatory Visit: Payer: Self-pay

## 2021-09-05 DIAGNOSIS — Z801 Family history of malignant neoplasm of trachea, bronchus and lung: Secondary | ICD-10-CM | POA: Insufficient documentation

## 2021-09-05 DIAGNOSIS — I10 Essential (primary) hypertension: Secondary | ICD-10-CM | POA: Insufficient documentation

## 2021-09-05 DIAGNOSIS — M8580 Other specified disorders of bone density and structure, unspecified site: Secondary | ICD-10-CM | POA: Diagnosis not present

## 2021-09-05 DIAGNOSIS — D72829 Elevated white blood cell count, unspecified: Secondary | ICD-10-CM | POA: Insufficient documentation

## 2021-09-05 DIAGNOSIS — Z85828 Personal history of other malignant neoplasm of skin: Secondary | ICD-10-CM | POA: Insufficient documentation

## 2021-09-05 DIAGNOSIS — Z79899 Other long term (current) drug therapy: Secondary | ICD-10-CM | POA: Diagnosis not present

## 2021-09-05 DIAGNOSIS — D72820 Lymphocytosis (symptomatic): Secondary | ICD-10-CM | POA: Insufficient documentation

## 2021-09-05 DIAGNOSIS — Z803 Family history of malignant neoplasm of breast: Secondary | ICD-10-CM | POA: Diagnosis not present

## 2021-09-05 DIAGNOSIS — E039 Hypothyroidism, unspecified: Secondary | ICD-10-CM | POA: Diagnosis not present

## 2021-09-05 LAB — CBC WITH DIFFERENTIAL/PLATELET
Band Neutrophils: 6 %
Basophils Absolute: 0.2 10*3/uL — ABNORMAL HIGH (ref 0.0–0.1)
Basophils Relative: 2 %
Eosinophils Absolute: 0 10*3/uL (ref 0.0–0.5)
Eosinophils Relative: 0 %
HCT: 36.1 % (ref 36.0–46.0)
Hemoglobin: 11.7 g/dL — ABNORMAL LOW (ref 12.0–15.0)
Lymphocytes Relative: 51 %
Lymphs Abs: 5.6 10*3/uL — ABNORMAL HIGH (ref 0.7–4.0)
MCH: 30 pg (ref 26.0–34.0)
MCHC: 32.4 g/dL (ref 30.0–36.0)
MCV: 92.6 fL (ref 80.0–100.0)
Monocytes Absolute: 0.4 10*3/uL (ref 0.1–1.0)
Monocytes Relative: 4 %
Neutro Abs: 4.7 10*3/uL (ref 1.7–7.7)
Neutrophils Relative %: 37 %
Platelets: 392 10*3/uL (ref 150–400)
RBC: 3.9 MIL/uL (ref 3.87–5.11)
RDW: 13.8 % (ref 11.5–15.5)
WBC: 10.9 10*3/uL — ABNORMAL HIGH (ref 4.0–10.5)
nRBC: 0 % (ref 0.0–0.2)

## 2021-09-05 LAB — LACTATE DEHYDROGENASE: LDH: 161 U/L (ref 98–192)

## 2021-09-05 NOTE — Patient Instructions (Signed)
[place] at [facility] Discharge Instructions  You were seen and examined today by Dr. Delton Coombes. Dr. Delton Coombes is a hematologist, meaning he specializes in blood disorders. Dr. Delton Coombes discussed your past medical history, family history of cancers/blood disorders, and the events that led to you being here today.  Dr. Wolfgang Phoenix referred you to Dr. Delton Coombes due to elevated white blood cells. Dr. Delton Coombes has recommended additional lab work in an attempt to identify the cause of this, including ruling out leukemia.  Follow-up as scheduled.   Thank you for choosing [place] at [facility] to provide your oncology and hematology care.  To afford each patient quality time with our provider, please arrive at least 15 minutes before your scheduled appointment time.   If you have a lab appointment with the [facility] please come in thru the Main Entrance and check in at the main information desk.  You need to re-schedule your appointment should you arrive 10 or more minutes late.  We strive to give you quality time with our providers, and arriving late affects you and other patients whose appointments are after yours.  Also, if you no show three or more times for appointments you may be dismissed from the clinic at the providers discretion.     Again, thank you for choosing [facility].  Our hope is that these requests will decrease the amount of time that you wait before being seen by our physicians.       _____________________________________________________________  Should you have questions after your visit to [facility], please contact our office at [phone] and follow the prompts.  Our office hours are 8:00 a.m. and 4:30 p.m. Monday - Friday.  Please note that voicemails left after 4:00 p.m. may not be returned until the following business day.  We are closed weekends and major holidays.  You do have access  to a nurse 24-7, just call the main number to the clinic [phone] and do not press any options, hold on the line and a nurse will answer the phone.    For prescription refill requests, have your pharmacy contact our office and allow 72 hours.    Due to Covid, you will need to wear a mask upon entering the hospital. If you do not have a mask, a mask will be given to you at the Main Entrance upon arrival. For doctor visits, patients may have 1 support person age 98 or older with them. For treatment visits, patients can not have anyone with them due to social distancing guidelines and our immunocompromised population.

## 2021-09-09 LAB — SURGICAL PATHOLOGY

## 2021-10-02 DIAGNOSIS — D225 Melanocytic nevi of trunk: Secondary | ICD-10-CM | POA: Diagnosis not present

## 2021-10-02 DIAGNOSIS — X32XXXA Exposure to sunlight, initial encounter: Secondary | ICD-10-CM | POA: Diagnosis not present

## 2021-10-02 DIAGNOSIS — Z85828 Personal history of other malignant neoplasm of skin: Secondary | ICD-10-CM | POA: Diagnosis not present

## 2021-10-02 DIAGNOSIS — Z1283 Encounter for screening for malignant neoplasm of skin: Secondary | ICD-10-CM | POA: Diagnosis not present

## 2021-10-02 DIAGNOSIS — L570 Actinic keratosis: Secondary | ICD-10-CM | POA: Diagnosis not present

## 2021-10-02 DIAGNOSIS — Z08 Encounter for follow-up examination after completed treatment for malignant neoplasm: Secondary | ICD-10-CM | POA: Diagnosis not present

## 2021-10-04 NOTE — Progress Notes (Signed)
[name], [place] [address]   CLINIC:  Medical Oncology/Hematology  PCP:  Kathyrn Drown, MD [address] [place] / [place] [address]  [phone]  REASON FOR VISIT:  Follow-up for CLL  PRIOR THERAPY: none  CURRENT THERAPY: under work-up  INTERVAL HISTORY:  [name], a 85 y.o. female, returns for routine follow-up for her CLL. [name] was last seen on 09/05/2021.  Today she reports feeling well. She denies fever, night sweats, and weight loss. She reports right side head pain which radiates down her neck and is helped by tylenol.   REVIEW OF SYSTEMS:  Review of Systems  Constitutional:  Negative for appetite change, fatigue (50%), fever and unexpected weight change.  HENT:   Positive for trouble swallowing.   Respiratory:  Positive for shortness of breath.   Musculoskeletal:  Positive for neck pain.  Neurological:  Positive for headaches.  Psychiatric/Behavioral:  Positive for depression and sleep disturbance.   All other systems reviewed and are negative.  PAST MEDICAL/SURGICAL HISTORY:  Past Medical History:  Diagnosis Date   Depression    H/O hiatal hernia    Hernia    Hernia    right inguinal hernia   Hypertension    Hypothyroidism    Leg swelling    Osteitis pubis [facility]) July 2002   Osteopenia May 2010   Thyroid disease    Past Surgical History:  Procedure Laterality Date   ABDOMINAL HYSTERECTOMY  03/1970   APPENDECTOMY  03/1966   BILATERAL SALPINGOOPHORECTOMY     CATARACT EXTRACTION W/PHACO Right 09/25/2014   Procedure: CATARACT EXTRACTION PHACO AND INTRAOCULAR LENS PLACEMENT ([place]);  Surgeon: Elta Guadeloupe T. Gershon Crane, MD;  Location: [facility];  Service: Ophthalmology;  Laterality: Right;  CDE:7.56   CATARACT EXTRACTION W/PHACO Left 10/09/2014   Procedure: CATARACT EXTRACTION PHACO AND INTRAOCULAR LENS PLACEMENT LEFT EYE CDE=6.51;  Surgeon: Elta Guadeloupe T. Gershon Crane, MD;  Location: [facility];  Service: Ophthalmology;   Laterality: Left;   [place]   COLONOSCOPY  March 2007   Repeat 10 years   COLONOSCOPY  11/06/2003    Internal and external hemorrhoids/Two tiny rectal distal sigmoid polyps cold biopsied/Otherwise within normal limits to the cecum   COLONOSCOPY N/A 08/29/2014   Dr. Gala Romney: Sessile polyp was found at the cecum status post cold snare polypectomy, tubular adenoma. No further surveillance unless clinical status changes   ESOPHAGOGASTRODUODENOSCOPY  2007   Dr. 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   Smoking status: Never   Smokeless tobacco: Never   Tobacco comments:    Never smoked  Substance and Sexual Activity   Alcohol use: Yes    Alcohol/week: 0.0 standard drinks    Comment: occasional margarita   Drug use: No   Sexual activity: Yes    Birth control/protection: Surgical  Other Topics Concern   Not on  file  Social History Narrative   Not on file   Social Determinants of Health   Financial Resource Strain: Low Risk    Difficulty of Paying Living Expenses: Not hard at all  Food Insecurity: No Food Insecurity   Worried About Charity fundraiser in the Last Year: Never true   Arboriculturist in the Last Year: Never true  Transportation Needs: No Transportation Needs   Lack of Transportation (Medical): No   Lack of Transportation (Non-Medical): No  Physical Activity: Sufficiently Active   Days of Exercise per Week: 5 days   Minutes of Exercise per Session: 30 min   Stress: No Stress Concern Present   Feeling of Stress : Only a little  Social Connections: Engineer, building services of Communication with Friends and Family: More than three times a week   Frequency of Social Gatherings with Friends and Family: More than three times a week   Attends Religious Services: 1 to 4 times per year   Active Member of [employer] or Organizations: Yes   Attends Archivist Meetings: 1 to 4 times per year   Marital Status: Married  Human resources officer Violence: Not At Risk   Fear of Current or Ex-Partner: No   Emotionally Abused: No   Physically Abused: No   Sexually Abused: No    FAMILY HISTORY:  Family History  Problem Relation Age of Onset   Dementia Mother    Heart disease Father    Cancer Father        throat   Cancer Sister        breast   Colon cancer Neg Hx     CURRENT MEDICATIONS:  Current Outpatient Medications  Medication Sig Dispense Refill   alendronate (FOSAMAX) 70 MG tablet TAKE 1 TABLET(70 MG) BY MOUTH EVERY 7 DAYS WITH A FULL GLASS OF WATER AND ON AN EMPTY STOMACH 4 tablet 5   ALPRAZolam (XANAX) 0.5 MG tablet TAKE 1 TABLET(0.5 MG) BY MOUTH TWICE DAILY AS NEEDED (Patient not taking: Reported on 09/05/2021) 60 tablet 4   Cholecalciferol (VITAMIN D3) 2000 UNITS TABS Take 2 tablets by mouth daily.      indapamide (LOZOL) 1.25 MG tablet Take one tablet po every morning 90 tablet 1   levothyroxine (SYNTHROID) 125 MCG tablet Take one tablet by mouth daily 90 tablet 1   metoprolol succinate (TOPROL-XL) 25 MG 24 hr tablet Take one tablet daily 90 tablet 1   Multiple Vitamin (MULTI-VITAMIN DAILY PO) Take by mouth.     pantoprazole (PROTONIX) 40 MG tablet TAKE 1 TABLET(40 MG) BY MOUTH DAILY 90 tablet 1   potassium chloride (KLOR-CON) 10 MEQ tablet TAKE 1 TABLET(10 MEQ) BY MOUTH DAILY 90 tablet 1   sertraline (ZOLOFT) 50 MG tablet TAKE 1 TABLET(50 MG) BY MOUTH DAILY 90 tablet 1   No current facility-administered medications for this  visit.    ALLERGIES:  Allergies  Allergen Reactions   Erythromycin Shortness Of Breath   Aspirin Other (See Comments)    Caused internal bleeding. Patient had ulcer at the time. Instructed by PCP - Darral Dash not to take any longer.   Ibuprofen     Instructed not to take by PCP - Darral Dash due to reaction to aspirin as well (precautionary).    PHYSICAL EXAM:  Performance status (ECOG): 1 - Symptomatic but completely ambulatory  There were no vitals filed for this visit. Wt Readings from Last 3 Encounters:  09/05/21 186 lb 11.2 oz (84.7 kg)  06/06/21 192 lb (87.1 kg)  04/21/21 191 lb 12.8 oz (87 kg)   Physical Exam Vitals reviewed.  Constitutional:      Appearance: Normal appearance.  Cardiovascular:     Rate and Rhythm: Normal rate and regular rhythm.     Pulses: Normal pulses.     Heart sounds: Normal heart sounds.  Pulmonary:     Effort: Pulmonary effort is normal.     Breath sounds: Normal breath sounds.  Lymphadenopathy:     Cervical: No cervical adenopathy.     Right cervical: No superficial, deep or posterior cervical adenopathy.    Left cervical: No superficial, deep or posterior cervical adenopathy.     Upper Body:     Right upper body: No axillary or pectoral adenopathy.     Left upper body: No axillary or pectoral adenopathy.  Neurological:     General: No focal deficit present.     Mental Status: She is alert and oriented to person, place, and time.  Psychiatric:        Mood and Affect: Mood normal.        Behavior: Behavior normal.    LABORATORY DATA:  I have reviewed the labs as listed.  CBC Latest Ref Rng & Units 09/05/2021 06/03/2021 02/05/2021  WBC 4.0 - 10.5 K/uL 10.9(H) 13.7(H) 11.0(H)  Hemoglobin 12.0 - 15.0 g/dL 11.7(L) 12.5 12.6  Hematocrit 36.0 - 46.0 % 36.1 39.1 38.1  Platelets 150 - 400 K/uL 392 427 448   CMP Latest Ref Rng & Units 06/03/2021 02/05/2021 10/15/2020  Glucose 65 - 99 mg/dL 92 104(H) 110(H)  BUN 8 - 27 mg/dL 16 15 14   Creatinine 0.57 -  1.00 mg/dL 1.05(H) 1.09(H) 0.93  Sodium 134 - 144 mmol/L 139 138 139  Potassium 3.5 - 5.2 mmol/L 3.7 3.6 4.2  Chloride 96 - 106 mmol/L 100 95(L) 97  CO2 20 - 29 mmol/L 25 27 27   Calcium 8.7 - 10.3 mg/dL 9.2 9.4 9.3  Total Protein 6.0 - 8.5 g/dL - 7.1 6.8  Total Bilirubin 0.0 - 1.2 mg/dL - 0.2 0.6  Alkaline Phos 44 - 121 IU/L - 68 71  AST 0 - 40 IU/L - 20 16  ALT 0 - 32 IU/L - 14 11      Component Value Date/Time   RBC 3.90 09/05/2021 1113   MCV 92.6 09/05/2021 1113   MCV 92 06/03/2021 1058   MCH 30.0 09/05/2021 1113   MCHC 32.4 09/05/2021 1113   RDW 13.8 09/05/2021 1113   RDW 12.8 06/03/2021 1058   LYMPHSABS 5.6 (H) 09/05/2021 1113   LYMPHSABS 6.9 (H) 06/03/2021 1058   MONOABS 0.4 09/05/2021 1113   EOSABS 0.0 09/05/2021 1113   EOSABS 0.2 06/03/2021 1058   BASOSABS 0.2 (H) 09/05/2021 1113   BASOSABS 0.1 06/03/2021 1058    DIAGNOSTIC IMAGING:  I have independently reviewed the scans and discussed with the patient. No results found.   ASSESSMENT:  CLL: - Patient seen at the request of Dr. Wolfgang Phoenix for leukocytosis. - CBC on 06/03/2021 white count 13.7, normal hemoglobin and platelets.  Differential showed increased lymphocytosis. - Review of her labs showed increase in absolute lymphocytosis since May 2016. - Ultrasound abdomen on 02/11/2021 showed normal spleen size.  No other abnormalities. - She does not have any B symptoms or recurrent infections.  She was treated for 1 UTI this year.  She is not on steroids.  No skin rashes.   Social/Family history: - She is recently widowed when her  husband passed away in August 2022.  She lives by herself and does all her ADLs and IADLs.  She used to work in [place] prior to retirement.  She is non-smoker. - Father had throat cancer.  Sister had breast cancer.  No family history of leukemias.   PLAN:  [place] stage 0 CLL: - We reviewed flow cytometry results from 09/05/2021 which was consistent with B-cell lymphoproliferative process. - She  does not have a significant anemia or any thrombocytopenia.  No palpable lymphadenopathy or splenomegaly on physical exam.  She does not have any B symptoms. - We talked about indications for treatment which she does not have any. - Recommend surveillance at 35-month intervals.  She was told to see [place] sooner should she develop any fevers, night sweats or unintentional weight loss.  Orders placed this encounter:  No orders of the defined types were placed in this encounter.    Derek Jack, MD [place] [phone]   I, Thana Ates, am acting as a scribe for Dr. Derek Jack.  I, Derek Jack MD, have reviewed the above documentation for accuracy and completeness, and I agree with the above.

## 2021-10-06 ENCOUNTER — Other Ambulatory Visit: Payer: Self-pay

## 2021-10-06 ENCOUNTER — Inpatient Hospital Stay (HOSPITAL_COMMUNITY): Payer: PPO | Attending: Hematology | Admitting: Hematology

## 2021-10-06 VITALS — BP 162/84 | HR 70 | Temp 98.0°F | Resp 18 | Wt 187.8 lb

## 2021-10-06 DIAGNOSIS — Z801 Family history of malignant neoplasm of trachea, bronchus and lung: Secondary | ICD-10-CM | POA: Insufficient documentation

## 2021-10-06 DIAGNOSIS — E039 Hypothyroidism, unspecified: Secondary | ICD-10-CM | POA: Diagnosis not present

## 2021-10-06 DIAGNOSIS — Z79899 Other long term (current) drug therapy: Secondary | ICD-10-CM | POA: Insufficient documentation

## 2021-10-06 DIAGNOSIS — C9110 Chronic lymphocytic leukemia of B-cell type not having achieved remission: Secondary | ICD-10-CM | POA: Insufficient documentation

## 2021-10-06 DIAGNOSIS — Z803 Family history of malignant neoplasm of breast: Secondary | ICD-10-CM | POA: Insufficient documentation

## 2021-10-06 DIAGNOSIS — R519 Headache, unspecified: Secondary | ICD-10-CM | POA: Insufficient documentation

## 2021-10-06 DIAGNOSIS — M8580 Other specified disorders of bone density and structure, unspecified site: Secondary | ICD-10-CM | POA: Diagnosis not present

## 2021-10-06 DIAGNOSIS — D72820 Lymphocytosis (symptomatic): Secondary | ICD-10-CM | POA: Diagnosis not present

## 2021-10-06 DIAGNOSIS — I10 Essential (primary) hypertension: Secondary | ICD-10-CM | POA: Diagnosis not present

## 2021-10-06 NOTE — Patient Instructions (Addendum)
[place] at [facility] Discharge Instructions  You were seen and examined by Dr. Delton Coombes.  You are diagnosed with chronic lymphocytic leukemia (CLL).  You do not require any treatment for this at this time.  We will keep a watch on your blood work and initiate treatment in the future if needed.   Call the clinic sooner than your next appointment if you develop persistent, unexplained fevers, drenching night sweats, or enlarged lymph nodes.  Return as scheduled in 6 months for lab work and office visit.    Thank you for choosing [place] at [facility] to provide your oncology and hematology care.  To afford each patient quality time with our provider, please arrive at least 15 minutes before your scheduled appointment time.   If you have a lab appointment with the [facility] please come in thru the Main Entrance and check in at the main information desk.  You need to re-schedule your appointment should you arrive 10 or more minutes late.  We strive to give you quality time with our providers, and arriving late affects you and other patients whose appointments are after yours.  Also, if you no show three or more times for appointments you may be dismissed from the clinic at the providers discretion.     Again, thank you for choosing [facility].  Our hope is that these requests will decrease the amount of time that you wait before being seen by our physicians.       _____________________________________________________________  Should you have questions after your visit to [facility], please contact our office at [phone] and follow the prompts.  Our office hours are 8:00 a.m. and 4:30 p.m. Monday - Friday.  Please note that voicemails left after 4:00 p.m. may not be returned until the following business day.  We are closed weekends and major holidays.  You do have access to a nurse 24-7, just call the  main number to the clinic [phone] and do not press any options, hold on the line and a nurse will answer the phone.    For prescription refill requests, have your pharmacy contact our office and allow 72 hours.    Due to Covid, you will need to wear a mask upon entering the hospital. If you do not have a mask, a mask will be given to you at the Main Entrance upon arrival. For doctor visits, patients may have 1 support person age 22 or older with them. For treatment visits, patients can not have anyone with them due to social distancing guidelines and our immunocompromised population.

## 2021-10-07 ENCOUNTER — Ambulatory Visit (INDEPENDENT_AMBULATORY_CARE_PROVIDER_SITE_OTHER): Payer: PPO | Admitting: Family Medicine

## 2021-10-07 ENCOUNTER — Ambulatory Visit: Payer: PPO | Admitting: Family Medicine

## 2021-10-07 VITALS — BP 139/78 | Ht 65.0 in | Wt 187.8 lb

## 2021-10-07 DIAGNOSIS — I10 Essential (primary) hypertension: Secondary | ICD-10-CM | POA: Diagnosis not present

## 2021-10-07 DIAGNOSIS — E038 Other specified hypothyroidism: Secondary | ICD-10-CM | POA: Diagnosis not present

## 2021-10-07 DIAGNOSIS — E7849 Other hyperlipidemia: Secondary | ICD-10-CM | POA: Diagnosis not present

## 2021-10-07 DIAGNOSIS — R519 Headache, unspecified: Secondary | ICD-10-CM

## 2021-10-07 NOTE — Progress Notes (Signed)
   Subjective:    Patient ID: [name], female    DOB: September 07, 1936, 85 y.o.   MRN: [id number]  Hypertension This is a chronic problem. The current episode started more than 1 year ago. Risk factors for coronary artery disease include post-menopausal state. Treatments tried: lozol, toprol xl. There are no compliance problems.    She is going through the loss of her husband which has been very difficult for her but she is making progress Has underlying thyroid condition takes her medicine denies any problems Reflux under decent control Anxiety related to the loss of her husband doing better Insomnia uses Xanax at nighttime to help her rest  Review of Systems     Objective:   Physical Exam  General-in no acute distress Eyes-no discharge Lungs-respiratory rate normal, CTA CV-no murmurs,RRR Extremities skin warm dry no edema Neuro grossly normal Behavior normal, alert       Assessment & Plan:  1. Essential hypertension, benign HTN- patient seen for follow-up regarding HTN.  Diet, medication compliance, appropriate labs and refills were completed.  Importance of keeping blood pressure under good control to lessen the risk of complications discussed  - Basic Metabolic Panel (BMET) - Sed Rate (ESR)  2. Other specified hypothyroidism Patient was seen today regarding hypothyroidism.  Importance of healthy diet, regular physical activity was discussed.  Importance of compliance with medication and regular checks regarding this was discussed.  . - Basic Metabolic Panel (BMET) - Sed Rate (ESR)  3. Other hyperlipidemia Hyperlipidemia-importance of diet, weight control, activity, compliance with medications discussed.  Recent labs reviewed.  Any additional labs or refills ordered.  Importance of keeping under good control discussed.  - Basic Metabolic Panel (BMET) - Sed Rate (ESR)  4. Right-sided headache Intermittent right side headache we will check a sed rate to make sure  that this is not temporal arteritis I believe it is more likely arthritis in the upper neck causing impingement of nerves which could be causing occipital pain - Basic Metabolic Panel (BMET) - Sed Rate (ESR)  Follow-up in 3 to 4 months

## 2021-10-07 NOTE — Addendum Note (Signed)
Addended by: Joie Bimler on: 10/07/2021 04:02 PM   Modules accepted: Orders

## 2021-10-07 NOTE — Patient Instructions (Signed)

## 2021-10-08 LAB — BASIC METABOLIC PANEL
BUN/Creatinine Ratio: 14 (ref 12–28)
BUN: 11 mg/dL (ref 8–27)
CO2: 27 mmol/L (ref 20–29)
Calcium: 9.4 mg/dL (ref 8.7–10.3)
Chloride: 96 mmol/L (ref 96–106)
Creatinine, Ser: 0.81 mg/dL (ref 0.57–1.00)
Glucose: 88 mg/dL (ref 70–99)
Potassium: 4.7 mmol/L (ref 3.5–5.2)
Sodium: 137 mmol/L (ref 134–144)
eGFR: 72 mL/min/{1.73_m2} (ref >59)

## 2021-10-08 LAB — SEDIMENTATION RATE: Sed Rate: 17 mm/hr (ref 0–40)

## 2021-10-09 LAB — FLOW CYTOMETRY

## 2021-11-06 DIAGNOSIS — L570 Actinic keratosis: Secondary | ICD-10-CM | POA: Diagnosis not present

## 2021-11-06 DIAGNOSIS — X32XXXD Exposure to sunlight, subsequent encounter: Secondary | ICD-10-CM | POA: Diagnosis not present

## 2021-11-10 ENCOUNTER — Other Ambulatory Visit: Payer: Self-pay | Admitting: Family Medicine

## 2021-11-20 ENCOUNTER — Other Ambulatory Visit: Payer: Self-pay

## 2021-11-20 MED ORDER — PANTOPRAZOLE SODIUM 40 MG PO TBEC: DELAYED_RELEASE_TABLET | ORAL | 1 refills | Status: DC

## 2021-12-03 ENCOUNTER — Ambulatory Visit
Admission: EM | Admit: 2021-12-03 | Discharge: 2021-12-03 | Disposition: A | Discharge status: Home / Self Care | Payer: PPO | Attending: Family Medicine | Admitting: Family Medicine

## 2021-12-03 ENCOUNTER — Other Ambulatory Visit: Payer: Self-pay

## 2021-12-03 ENCOUNTER — Encounter: Payer: Self-pay | Admitting: Emergency Medicine

## 2021-12-03 DIAGNOSIS — N390 Urinary tract infection, site not specified: Secondary | ICD-10-CM | POA: Insufficient documentation

## 2021-12-03 DIAGNOSIS — J069 Acute upper respiratory infection, unspecified: Secondary | ICD-10-CM | POA: Insufficient documentation

## 2021-12-03 LAB — POCT URINALYSIS DIP (MANUAL ENTRY)
Bilirubin, UA: NEGATIVE
Blood, UA: NEGATIVE
Glucose, UA: NEGATIVE mg/dL
Ketones, POC UA: NEGATIVE mg/dL
Nitrite, UA: NEGATIVE
Spec Grav, UA: 1.025 (ref 1.010–1.025)
Urobilinogen, UA: 0.2 E.U./dL
pH, UA: 5 (ref 5.0–8.0)

## 2021-12-03 MED ORDER — CIPROFLOXACIN HCL 500 MG PO TABS: 500.0000 mg | ORAL_TABLET | Freq: Two times a day (BID) | ORAL | 0 refills | Status: DC

## 2021-12-03 NOTE — ED Triage Notes (Signed)
Cough since Sunday.  Cough worse at night.  Bad smell to urine with urinary frequency.

## 2021-12-03 NOTE — ED Provider Notes (Signed)
[facility]    CSN: [id number] Arrival date & time: 12/03/21  1405      History   Chief Complaint No chief complaint on file.   HPI [name] is a 86 y.o. female.   Patient presenting today with 4-day history of cough, runny nose, scratchy throat that seems to be improving and now just a dry hacking cough.  Denies fever, chills, body aches, chest pain, shortness of breath.  Taking Coricidin HBP with good temporary relief.  No known history of pulmonary disease.  She is also been having about 10 days of strong odor to her urine, urinary hesitancy and frequency.  States the past year she has been struggling with urinary tract infections.  Not trying anything over-the-counter for symptoms.   Past Medical History:  Diagnosis Date   Depression    H/O hiatal hernia    Hernia    Hernia    right inguinal hernia   Hypertension    Hypothyroidism    Leg swelling    Osteitis pubis [facility]) July 2002   Osteopenia May 2010   Thyroid disease     Patient Active Problem List   Diagnosis Date Noted   Leukocytosis 09/05/2021   Depression, major, single episode, moderate ([place]) 05/12/2018   Dyspnea on exertion 05/24/2015   Bilateral leg edema 05/24/2015   Heme positive stool 08/13/2014   GERD (gastroesophageal reflux disease) 07/02/2014   Encounter for screening colonoscopy 07/02/2014   Hyperlipidemia 05/16/2013   Hypothyroidism 05/16/2013   Essential hypertension, benign 05/16/2013   Inguinal hernia, right 04/27/2012    Past Surgical History:  Procedure Laterality Date   ABDOMINAL HYSTERECTOMY  03/1970   APPENDECTOMY  03/1966   BILATERAL SALPINGOOPHORECTOMY     CATARACT EXTRACTION W/PHACO Right 09/25/2014   Procedure: CATARACT EXTRACTION PHACO AND INTRAOCULAR LENS PLACEMENT ([place]);  Surgeon: Elta Guadeloupe T. Gershon Crane, MD;  Location: [facility];  Service: Ophthalmology;  Laterality: Right;  CDE:7.56   CATARACT EXTRACTION W/PHACO Left 10/09/2014   Procedure: CATARACT EXTRACTION PHACO  AND INTRAOCULAR LENS PLACEMENT LEFT EYE CDE=6.51;  Surgeon: Elta Guadeloupe T. Gershon Crane, MD;  Location: [facility];  Service: Ophthalmology;  Laterality: Left;   [place]   COLONOSCOPY  March 2007   Repeat 10 years   COLONOSCOPY  11/06/2003    Internal and external hemorrhoids/Two tiny rectal distal sigmoid polyps cold biopsied/Otherwise within normal limits to the cecum   COLONOSCOPY N/A 08/29/2014   Dr. Gala Romney: Sessile polyp was found at the cecum status post cold snare polypectomy, tubular adenoma. No further surveillance unless clinical status changes   ESOPHAGOGASTRODUODENOSCOPY  2007   Dr. Gala Romney: normal esophagus, small tom oderate sized hiatal hernia, Cameron lesions   SKIN CANCER EXCISION Left 11 1 2016   left arm squamous cancer   TUMOR REMOVAL  03/1968   ovarian    OB History   No obstetric history on file.      Home Medications    Prior to Admission medications   Medication Sig Start Date End Date Taking? Authorizing Provider  ciprofloxacin (CIPRO) 500 MG tablet Take 1 tablet (500 mg total) by mouth 2 (two) times daily. 12/03/21  Yes Volney American, PA-C  alendronate (FOSAMAX) 70 MG tablet TAKE 1 TABLET(70 MG) BY MOUTH EVERY 7 DAYS WITH A FULL GLASS OF WATER AND ON AN EMPTY STOMACH 11/10/21   Kathyrn Drown, MD  ALPRAZolam (XANAX) 0.5 MG tablet TAKE 1 TABLET(0.5 MG) BY MOUTH TWICE DAILY AS NEEDED  06/22/21   Kathyrn Drown, MD  Cholecalciferol (VITAMIN D3) 2000 UNITS TABS Take 2 tablets by mouth daily.     [provider]  indapamide (LOZOL) 1.25 MG tablet Take one tablet po every morning 06/11/21   Kathyrn Drown, MD  levothyroxine (SYNTHROID) 125 MCG tablet Take one tablet by mouth daily 05/06/21   Kathyrn Drown, MD  metoprolol succinate (TOPROL-XL) 25 MG 24 hr tablet Take one tablet daily 05/01/21   Kathyrn Drown, MD  Multiple Vitamin (MULTI-VITAMIN DAILY PO) Take by mouth.    [provider]  pantoprazole (PROTONIX) 40 MG tablet  TAKE 1 TABLET(40 MG) BY MOUTH DAILY 11/20/21   Kathyrn Drown, MD  potassium chloride (KLOR-CON) 10 MEQ tablet TAKE 1 TABLET(10 MEQ) BY MOUTH DAILY 06/11/21   Kathyrn Drown, MD  sertraline (ZOLOFT) 50 MG tablet TAKE 1 TABLET(50 MG) BY MOUTH DAILY 05/01/21   Kathyrn Drown, MD    Family History Family History  Problem Relation Age of Onset   Dementia Mother    Heart disease Father    Cancer Father        throat   Cancer Sister        breast   Colon cancer Neg Hx     Social History Social History   Tobacco Use   Smoking status: Never   Smokeless tobacco: Never   Tobacco comments:    Never smoked  Substance Use Topics   Alcohol use: Yes    Alcohol/week: 0.0 standard drinks    Comment: occasional margarita   Drug use: No     Allergies   Erythromycin, Aspirin, and Ibuprofen   Review of Systems Review of Systems Per HPI  Physical Exam Triage Vital Signs ED Triage Vitals  Enc Vitals Group     BP 12/03/21 1453 (!) 144/79     Pulse Rate 12/03/21 1453 (!) 117     Resp 12/03/21 1453 20     Temp 12/03/21 1453 97.8 F (36.6 C)     Temp Source 12/03/21 1453 Temporal     SpO2 12/03/21 1453 95 %     Weight --      Height --      Head Circumference --      Peak Flow --      Pain Score 12/03/21 1454 0     Pain Loc --      Pain Edu? --      Excl. in [place]? --    No data found.  Updated Vital Signs BP (!) 144/79 (BP Location: Right Arm)    Pulse (!) 117    Temp 97.8 F (36.6 C) (Temporal)    Resp 20    SpO2 95%   Visual Acuity Right Eye Distance:   Left Eye Distance:   Bilateral Distance:    Right Eye Near:   Left Eye Near:    Bilateral Near:     Physical Exam Vitals and nursing note reviewed.  Constitutional:      Appearance: Normal appearance. She is not ill-appearing.  HENT:     Head: Atraumatic.     Right Ear: Tympanic membrane and external ear normal.     Left Ear: Tympanic membrane and external ear normal.     Nose: Rhinorrhea present.      Mouth/Throat:     Mouth: Mucous membranes are moist.     Pharynx: Posterior oropharyngeal erythema present.  Eyes:     Extraocular Movements: Extraocular movements intact.  Conjunctiva/sclera: Conjunctivae normal.  Cardiovascular:     Rate and Rhythm: Normal rate and regular rhythm.     Heart sounds: Normal heart sounds.  Pulmonary:     Effort: Pulmonary effort is normal.     Breath sounds: Normal breath sounds. No wheezing.  Abdominal:     General: Bowel sounds are normal. There is no distension.     Palpations: Abdomen is soft.     Tenderness: There is no abdominal tenderness. There is no guarding.  Musculoskeletal:        General: Normal range of motion.     Cervical back: Normal range of motion and neck supple.  Skin:    General: Skin is warm and dry.  Neurological:     Mental Status: She is alert and oriented to person, place, and time.  Psychiatric:        Mood and Affect: Mood normal.        Thought Content: Thought content normal.        Judgment: Judgment normal.     UC Treatments / Results  Labs (all labs ordered are listed, but only abnormal results are displayed) Labs Reviewed  POCT URINALYSIS DIP (MANUAL ENTRY) - Abnormal; Notable for the following components:      Result Value   Clarity, UA cloudy (*)    Protein Ur, POC trace (*)    Leukocytes, UA Moderate (2+) (*)    All other components within normal limits  URINE CULTURE    EKG   Radiology No results found.  Procedures Procedures (including critical care time)  Medications Ordered in UC Medications - No data to display  Initial Impression / Assessment and Plan / UC Course  I have reviewed the triage vital signs and the nursing notes.  Pertinent labs & imaging results that were available during my care of the patient were reviewed by me and considered in my medical decision making (see chart for details).     Suspect viral upper respiratory infection which patient states is seeming to  be resolving well with over-the-counter medications.  Lungs clear to auscultation bilaterally and oxygen saturation 95% on room air.  We will forego COVID and flu testing today as symptoms are improving and duration of symptoms.  UA showing possible urinary tract infection.  Urine culture pending, treat with Cipro in the meantime.  Push fluids.  Return for acutely worsening symptoms.  Final Clinical Impressions(s) / UC Diagnoses   Final diagnoses:  Acute lower UTI  Viral URI with cough   Discharge Instructions   None    ED Prescriptions     Medication Sig Dispense Auth. Provider   ciprofloxacin (CIPRO) 500 MG tablet Take 1 tablet (500 mg total) by mouth 2 (two) times daily. 10 tablet Volney American, [place]      PDMP not reviewed this encounter.   Volney American, [place] 12/03/21 1555

## 2021-12-06 LAB — URINE CULTURE: Culture: >=100000 — AB

## 2021-12-08 ENCOUNTER — Other Ambulatory Visit: Payer: Self-pay | Admitting: Family Medicine

## 2021-12-09 DIAGNOSIS — H524 Presbyopia: Secondary | ICD-10-CM | POA: Diagnosis not present

## 2021-12-09 DIAGNOSIS — Z961 Presence of intraocular lens: Secondary | ICD-10-CM | POA: Diagnosis not present

## 2021-12-09 DIAGNOSIS — H5213 Myopia, bilateral: Secondary | ICD-10-CM | POA: Diagnosis not present

## 2021-12-09 DIAGNOSIS — H52203 Unspecified astigmatism, bilateral: Secondary | ICD-10-CM | POA: Diagnosis not present

## 2021-12-14 ENCOUNTER — Encounter: Payer: Self-pay | Admitting: Emergency Medicine

## 2021-12-14 ENCOUNTER — Ambulatory Visit (INDEPENDENT_AMBULATORY_CARE_PROVIDER_SITE_OTHER): Payer: PPO

## 2021-12-14 ENCOUNTER — Other Ambulatory Visit: Payer: Self-pay

## 2021-12-14 ENCOUNTER — Ambulatory Visit
Admission: EM | Admit: 2021-12-14 | Discharge: 2021-12-14 | Disposition: A | Discharge status: Home / Self Care | Payer: PPO | Attending: Family Medicine | Admitting: Family Medicine

## 2021-12-14 DIAGNOSIS — J22 Unspecified acute lower respiratory infection: Secondary | ICD-10-CM

## 2021-12-14 DIAGNOSIS — R350 Frequency of micturition: Secondary | ICD-10-CM | POA: Diagnosis not present

## 2021-12-14 DIAGNOSIS — K449 Diaphragmatic hernia without obstruction or gangrene: Secondary | ICD-10-CM | POA: Diagnosis not present

## 2021-12-14 DIAGNOSIS — I517 Cardiomegaly: Secondary | ICD-10-CM | POA: Diagnosis not present

## 2021-12-14 DIAGNOSIS — R0602 Shortness of breath: Secondary | ICD-10-CM

## 2021-12-14 DIAGNOSIS — R059 Cough, unspecified: Secondary | ICD-10-CM

## 2021-12-14 LAB — POCT URINALYSIS DIP (MANUAL ENTRY)
Bilirubin, UA: NEGATIVE
Blood, UA: NEGATIVE
Glucose, UA: NEGATIVE mg/dL
Ketones, POC UA: NEGATIVE mg/dL
Leukocytes, UA: NEGATIVE
Nitrite, UA: NEGATIVE
Spec Grav, UA: 1.02 (ref 1.010–1.025)
Urobilinogen, UA: 0.2 E.U./dL
pH, UA: 7 (ref 5.0–8.0)

## 2021-12-14 MED ORDER — ALBUTEROL SULFATE HFA 108 (90 BASE) MCG/ACT IN AERS: 2.0000 | INHALATION_SPRAY | Freq: Four times a day (QID) | RESPIRATORY_TRACT | 0 refills | Status: DC | PRN

## 2021-12-14 MED ORDER — DOXYCYCLINE HYCLATE 100 MG PO CAPS
100.0000 mg | ORAL_CAPSULE | Freq: Two times a day (BID) | ORAL | 0 refills | Status: DC
Start: 2021-12-14 — End: 2022-01-07

## 2021-12-14 MED ORDER — GUAIFENESIN ER 600 MG PO TB12: 600.0000 mg | ORAL_TABLET | Freq: Two times a day (BID) | ORAL | 0 refills | Status: DC

## 2021-12-14 NOTE — ED Provider Notes (Signed)
[facility]    CSN: [id number] Arrival date & time: 12/14/21  2831      History   Chief Complaint Chief Complaint  Patient presents with   Cough    HPI [name] is a 86 y.o. female.   Presenting today with persistent cough for 1.5 weeks, now the last 3 days worsening dyspnea on exertion, unable to produce sputum with cough, fatigue.  Denies known fever, chest pain, weakness, abdominal pain, vomiting, diarrhea.  Has not been taking anything over-the-counter at this time for symptoms.  No known chronic pulmonary disease that she is aware of.  She was seen last week for this cough as well as a urinary tract infection, completed course of Cipro and states she still having some frequency and strong odor of urine.   Past Medical History:  Diagnosis Date   Chronic Leukemia    Depression    H/O hiatal hernia    Hernia    Hernia    right inguinal hernia   Hypertension    Hypothyroidism    Leg swelling    Osteitis pubis ([place]) 05/30/2001   Osteopenia 03/30/2009   Thyroid disease     Patient Active Problem List   Diagnosis Date Noted   Leukocytosis 09/05/2021   Depression, major, single episode, moderate ([place]) 05/12/2018   Dyspnea on exertion 05/24/2015   Bilateral leg edema 05/24/2015   Heme positive stool 08/13/2014   GERD (gastroesophageal reflux disease) 07/02/2014   Encounter for screening colonoscopy 07/02/2014   Hyperlipidemia 05/16/2013   Hypothyroidism 05/16/2013   Essential hypertension, benign 05/16/2013   Inguinal hernia, right 04/27/2012    Past Surgical History:  Procedure Laterality Date   ABDOMINAL HYSTERECTOMY  03/1970   APPENDECTOMY  03/1966   BILATERAL SALPINGOOPHORECTOMY     CATARACT EXTRACTION W/PHACO Right 09/25/2014   Procedure: CATARACT EXTRACTION PHACO AND INTRAOCULAR LENS PLACEMENT ([place]);  Surgeon: Elta Guadeloupe T. Gershon Crane, MD;  Location: [facility];  Service: Ophthalmology;  Laterality: Right;  CDE:7.56   CATARACT EXTRACTION W/PHACO Left  10/09/2014   Procedure: CATARACT EXTRACTION PHACO AND INTRAOCULAR LENS PLACEMENT LEFT EYE CDE=6.51;  Surgeon: Elta Guadeloupe T. Gershon Crane, MD;  Location: [facility];  Service: Ophthalmology;  Laterality: Left;   [place]   COLONOSCOPY  March 2007   Repeat 10 years   COLONOSCOPY  11/06/2003    Internal and external hemorrhoids/Two tiny rectal distal sigmoid polyps cold biopsied/Otherwise within normal limits to the cecum   COLONOSCOPY N/A 08/29/2014   Dr. Gala Romney: Sessile polyp was found at the cecum status post cold snare polypectomy, tubular adenoma. No further surveillance unless clinical status changes   ESOPHAGOGASTRODUODENOSCOPY  2007   Dr. Gala Romney: normal esophagus, small tom oderate sized hiatal hernia, Cameron lesions   SKIN CANCER EXCISION Left 11 1 2016   left arm squamous cancer   TUMOR REMOVAL  03/1968   ovarian    OB History   No obstetric history on file.      Home Medications    Prior to Admission medications   Medication Sig Start Date End Date Taking? Authorizing Provider  albuterol (VENTOLIN HFA) 108 (90 Base) MCG/ACT inhaler Inhale 2 puffs into the lungs every 6 (six) hours as needed for wheezing or shortness of breath. 12/14/21  Yes Volney American, PA-C  alendronate (FOSAMAX) 70 MG tablet TAKE 1 TABLET(70 MG) BY MOUTH EVERY 7 DAYS WITH A FULL GLASS OF WATER AND ON AN EMPTY STOMACH 11/10/21  Yes Sallee Lange  A, MD  ALPRAZolam (XANAX) 0.5 MG tablet TAKE 1 TABLET(0.5 MG) BY MOUTH TWICE DAILY AS NEEDED 12/09/21  Yes Luking, Elayne Snare, MD  Cholecalciferol (VITAMIN D3) 2000 UNITS TABS Take 2 tablets by mouth daily.    Yes [provider]  doxycycline (VIBRAMYCIN) 100 MG capsule Take 1 capsule (100 mg total) by mouth 2 (two) times daily. 12/14/21  Yes Volney American, PA-C  guaiFENesin (MUCINEX) 600 MG 12 hr tablet Take 1 tablet (600 mg total) by mouth 2 (two) times daily. 12/14/21  Yes Volney American, PA-C  indapamide (LOZOL) 1.25  MG tablet Take one tablet po every morning 06/11/21  Yes Luking, Elayne Snare, MD  levothyroxine (SYNTHROID) 125 MCG tablet Take one tablet by mouth daily 05/06/21  Yes Luking, Elayne Snare, MD  metoprolol succinate (TOPROL-XL) 25 MG 24 hr tablet Take one tablet daily 05/01/21  Yes Luking, Elayne Snare, MD  Multiple Vitamin (MULTI-VITAMIN DAILY PO) Take by mouth.   Yes [provider]  pantoprazole (PROTONIX) 40 MG tablet TAKE 1 TABLET(40 MG) BY MOUTH DAILY 11/20/21  Yes Luking, Scott A, MD  potassium chloride (KLOR-CON) 10 MEQ tablet TAKE 1 TABLET(10 MEQ) BY MOUTH DAILY 06/11/21  Yes Luking, Scott A, MD  sertraline (ZOLOFT) 50 MG tablet TAKE 1 TABLET(50 MG) BY MOUTH DAILY 05/01/21  Yes Luking, Elayne Snare, MD  ciprofloxacin (CIPRO) 500 MG tablet Take 1 tablet (500 mg total) by mouth 2 (two) times daily. 12/03/21   Volney American, PA-C    Family History Family History  Problem Relation Age of Onset   Dementia Mother    Heart disease Father    Cancer Father        throat   Cancer Sister        breast   Colon cancer Neg Hx     Social History Social History   Tobacco Use   Smoking status: Never   Smokeless tobacco: Never   Tobacco comments:    Never smoked  Substance Use Topics   Alcohol use: Yes    Alcohol/week: 0.0 standard drinks    Comment: occasional margarita   Drug use: No     Allergies   Erythromycin, Aspirin, and Ibuprofen   Review of Systems Review of Systems Per HPI  Physical Exam Triage Vital Signs ED Triage Vitals [12/14/21 0840]  Enc Vitals Group     BP (!) 147/99     Pulse Rate 81     Resp 18     Temp 98.2 F (36.8 C)     Temp Source Oral     SpO2 94 %     Weight 185 lb (83.9 kg)     Height 5' 5.5" (1.664 m)     Head Circumference      Peak Flow      Pain Score 0     Pain Loc      Pain Edu?      Excl. in [place]?    No data found.  Updated Vital Signs BP (!) 147/99 (BP Location: Right Arm)    Pulse 81    Temp 98.2 F (36.8 C) (Oral)    Resp 18    Ht 5'  5.5" (1.664 m)    Wt 185 lb (83.9 kg)    SpO2 94%    BMI 30.32 kg/m   Visual Acuity Right Eye Distance:   Left Eye Distance:   Bilateral Distance:    Right Eye Near:   Left Eye Near:  Bilateral Near:     Physical Exam Vitals and nursing note reviewed.  Constitutional:      Appearance: Normal appearance. She is not ill-appearing.  HENT:     Head: Atraumatic.     Right Ear: Tympanic membrane and external ear normal.     Left Ear: Tympanic membrane and external ear normal.     Mouth/Throat:     Mouth: Mucous membranes are moist.     Pharynx: No oropharyngeal exudate or posterior oropharyngeal erythema.  Eyes:     Extraocular Movements: Extraocular movements intact.     Conjunctiva/sclera: Conjunctivae normal.  Cardiovascular:     Rate and Rhythm: Normal rate and regular rhythm.     Heart sounds: Normal heart sounds.  Pulmonary:     Effort: Pulmonary effort is normal.     Breath sounds: No wheezing or rales.     Comments: Decreased breath sounds throughout, no obvious wheezes or rales but unable to take deep breaths as this triggers coughing Musculoskeletal:        General: Normal range of motion.     Cervical back: Normal range of motion and neck supple.  Skin:    General: Skin is warm and dry.  Neurological:     Mental Status: She is alert and oriented to person, place, and time.  Psychiatric:        Mood and Affect: Mood normal.        Thought Content: Thought content normal.        Judgment: Judgment normal.     UC Treatments / Results  Labs (all labs ordered are listed, but only abnormal results are displayed) Labs Reviewed  POCT URINALYSIS DIP (MANUAL ENTRY) - Abnormal; Notable for the following components:      Result Value   Protein Ur, POC trace (*)    All other components within normal limits    EKG   Radiology DG Chest 2 View  Result Date: 12/14/2021 CLINICAL DATA:  Productive cough EXAM: CHEST - 2 VIEW COMPARISON:  Images of previous study done  on 06/22/2002 are not available for comparison FINDINGS: Transverse diameter of heart is increased. There is prominence of right paratracheal soft tissues which may be due to prominent central vessels or lymphadenopathy in the mediastinum. Increased interstitial markings are seen in both lungs. There are no signs of alveolar pulmonary edema. There is no focal pulmonary consolidation. There is no significant pleural effusion or pneumothorax. Multiple surgical clips are seen in the epigastrium. There is air density in the retrocardiac region suggesting possible fixed hiatal hernia. IMPRESSION: Cardiomegaly. Increased interstitial markings are seen in both lungs suggesting scarring and possibly interstitial pneumonia. Moderate sized fixed hiatal hernia. Electronically Signed   By: Elmer Picker M.D.   On: 12/14/2021 09:30    Procedures Procedures (including critical care time)  Medications Ordered in UC Medications - No data to display  Initial Impression / Assessment and Plan / UC Course  I have reviewed the triage vital signs and the nursing notes.  Pertinent labs & imaging results that were available during my care of the patient were reviewed by me and considered in my medical decision making (see chart for details).     X-ray performed today given worsening shortness of breath, decreased oxygen saturation since prior visit last week and continued cough.  X-ray showing possible interstitial pneumonia.  Will cover with doxycycline, albuterol inhaler and Mucinex.  UA with no evidence of a persisting urinary tract infection.  Reassurance given.  Return for acutely worsening symptoms. Final Clinical Impressions(s) / UC Diagnoses   Final diagnoses:  Lower respiratory infection  SOB (shortness of breath)  Urinary frequency   Discharge Instructions   None    ED Prescriptions     Medication Sig Dispense Auth. Provider   doxycycline (VIBRAMYCIN) 100 MG capsule Take 1 capsule (100 mg  total) by mouth 2 (two) times daily. 20 capsule Volney American, [place]   albuterol (VENTOLIN HFA) 108 (90 Base) MCG/ACT inhaler Inhale 2 puffs into the lungs every 6 (six) hours as needed for wheezing or shortness of breath. 18 g Volney American, PA-C   guaiFENesin (MUCINEX) 600 MG 12 hr tablet Take 1 tablet (600 mg total) by mouth 2 (two) times daily. 30 tablet Volney American, [place]      PDMP not reviewed this encounter.   Volney American, [place] 12/14/21 1022

## 2021-12-14 NOTE — ED Notes (Signed)
Pt unable to provide urine sample at this time 

## 2021-12-14 NOTE — ED Triage Notes (Addendum)
Pt reports seen for same last week and reports continued cough,chest congestion, increased dyspnea with exertion last few days. 98% in triage on room air. Pt able to speak clear complete sentences. Airway patent.   Pt also reports was diagnosed with uti last week and reports "took the medicine but it still has a bad odor to it."

## 2021-12-15 ENCOUNTER — Other Ambulatory Visit: Payer: Self-pay | Admitting: Family Medicine

## 2021-12-25 DIAGNOSIS — L570 Actinic keratosis: Secondary | ICD-10-CM | POA: Diagnosis not present

## 2021-12-25 DIAGNOSIS — X32XXXD Exposure to sunlight, subsequent encounter: Secondary | ICD-10-CM | POA: Diagnosis not present

## 2022-01-06 ENCOUNTER — Other Ambulatory Visit: Payer: Self-pay

## 2022-01-06 ENCOUNTER — Other Ambulatory Visit: Payer: Self-pay | Admitting: Family Medicine

## 2022-01-06 MED ORDER — METOPROLOL SUCCINATE ER 25 MG PO TB24: ORAL_TABLET | ORAL | 0 refills | Status: DC

## 2022-01-07 ENCOUNTER — Ambulatory Visit (INDEPENDENT_AMBULATORY_CARE_PROVIDER_SITE_OTHER): Payer: PPO | Admitting: Family Medicine

## 2022-01-07 ENCOUNTER — Other Ambulatory Visit: Payer: Self-pay

## 2022-01-07 ENCOUNTER — Ambulatory Visit (HOSPITAL_COMMUNITY)
Admission: RE | Admit: 2022-01-07 | Discharge: 2022-01-07 | Disposition: A | Discharge status: Home / Self Care | Payer: PPO | Source: Ambulatory Visit | Attending: Family Medicine | Admitting: Family Medicine

## 2022-01-07 VITALS — BP 131/88 | HR 98 | Ht 65.0 in | Wt 191.0 lb

## 2022-01-07 DIAGNOSIS — Z8701 Personal history of pneumonia (recurrent): Secondary | ICD-10-CM

## 2022-01-07 DIAGNOSIS — J189 Pneumonia, unspecified organism: Secondary | ICD-10-CM | POA: Diagnosis not present

## 2022-01-07 DIAGNOSIS — I4891 Unspecified atrial fibrillation: Secondary | ICD-10-CM

## 2022-01-07 DIAGNOSIS — R0609 Other forms of dyspnea: Secondary | ICD-10-CM

## 2022-01-07 DIAGNOSIS — I517 Cardiomegaly: Secondary | ICD-10-CM | POA: Diagnosis not present

## 2022-01-07 MED ORDER — ALPRAZOLAM 0.5 MG PO TABS: ORAL_TABLET | ORAL | 2 refills | Status: DC

## 2022-01-07 MED ORDER — METOPROLOL SUCCINATE ER 25 MG PO TB24: ORAL_TABLET | ORAL | 1 refills | Status: DC

## 2022-01-07 MED ORDER — LEVOTHYROXINE SODIUM 125 MCG PO TABS: ORAL_TABLET | ORAL | 1 refills | Status: DC

## 2022-01-07 MED ORDER — ALBUTEROL SULFATE HFA 108 (90 BASE) MCG/ACT IN AERS: 2.0000 | INHALATION_SPRAY | Freq: Four times a day (QID) | RESPIRATORY_TRACT | 1 refills | Status: DC | PRN

## 2022-01-07 MED ORDER — POTASSIUM CHLORIDE CRYS ER 10 MEQ PO TBCR: EXTENDED_RELEASE_TABLET | ORAL | 1 refills | Status: DC

## 2022-01-07 NOTE — Progress Notes (Signed)
° °  Subjective:    Patient ID: [name], female    DOB: 1936-02-16, 86 y.o.   MRN: [id number]  HPI  Patient arrives for an urgent care follow up for pneumonia. Patient completed Doxycycline and was given Albuterol. Patient states she is still wheezing a lot and has to use inhaler a lot and gets SOB esp with activity. Patient requesting refill of Albuterol. Still having some fatigue tiredness gets short of breath with activity.  States the wheezing is gotten better.  Denies any chest tightness pressure or pain denies any significant swelling in the legs but has noted slight puffiness Review of Systems     Objective:   Physical Exam Heart is ir regular rate is controlled lungs clear respiratory rate normal Blood pressure decent control on recheck 124/80  EKG atrial fibrillation.     Assessment & Plan:  New onset atrial fibrillation Will help get her in with cardiology Will check lab work before initiating any type of blood thinners Repeat chest x-ray because of severity of pneumonia Follow-up patient weeks Lab work ordered as well

## 2022-01-08 LAB — CBC WITH DIFFERENTIAL/PLATELET
Basophils Absolute: 0.1 10*3/uL (ref 0.0–0.2)
Basos: 1 %
EOS (ABSOLUTE): 0.4 10*3/uL (ref 0.0–0.4)
Eos: 4 %
Hematocrit: 38.6 % (ref 34.0–46.6)
Hemoglobin: 12.3 g/dL (ref 11.1–15.9)
Immature Grans (Abs): 0 10*3/uL (ref 0.0–0.1)
Immature Granulocytes: 0 %
Lymphocytes Absolute: 4.3 10*3/uL — ABNORMAL HIGH (ref 0.7–3.1)
Lymphs: 40 %
MCH: 28.5 pg (ref 26.6–33.0)
MCHC: 31.9 g/dL (ref 31.5–35.7)
MCV: 89 fL (ref 79–97)
Monocytes Absolute: 1.1 10*3/uL — ABNORMAL HIGH (ref 0.1–0.9)
Monocytes: 10 %
Neutrophils Absolute: 4.8 10*3/uL (ref 1.4–7.0)
Neutrophils: 45 %
Platelets: 425 10*3/uL (ref 150–450)
RBC: 4.32 x10E6/uL (ref 3.77–5.28)
RDW: 12.5 % (ref 11.7–15.4)
WBC: 10.6 10*3/uL (ref 3.4–10.8)

## 2022-01-08 LAB — COMPREHENSIVE METABOLIC PANEL
ALT: 13 IU/L (ref 0–32)
AST: 20 IU/L (ref 0–40)
Albumin/Globulin Ratio: 1.5 (ref 1.2–2.2)
Albumin: 4.3 g/dL (ref 3.6–4.6)
Alkaline Phosphatase: 73 IU/L (ref 44–121)
BUN/Creatinine Ratio: 12 (ref 12–28)
BUN: 11 mg/dL (ref 8–27)
Bilirubin Total: 0.3 mg/dL (ref 0.0–1.2)
CO2: 27 mmol/L (ref 20–29)
Calcium: 9.6 mg/dL (ref 8.7–10.3)
Chloride: 95 mmol/L — ABNORMAL LOW (ref 96–106)
Creatinine, Ser: 0.95 mg/dL (ref 0.57–1.00)
Globulin, Total: 2.9 g/dL (ref 1.5–4.5)
Glucose: 100 mg/dL — ABNORMAL HIGH (ref 70–99)
Potassium: 4.8 mmol/L (ref 3.5–5.2)
Sodium: 136 mmol/L (ref 134–144)
Total Protein: 7.2 g/dL (ref 6.0–8.5)
eGFR: 59 mL/min/{1.73_m2} — ABNORMAL LOW (ref >59)

## 2022-01-08 LAB — TSH: TSH: 0.643 u[IU]/mL (ref 0.450–4.500)

## 2022-01-08 LAB — T4, FREE: Free T4: 1.56 ng/dL (ref 0.82–1.77)

## 2022-01-09 ENCOUNTER — Other Ambulatory Visit: Payer: Self-pay | Admitting: Family Medicine

## 2022-01-09 ENCOUNTER — Telehealth: Payer: Self-pay | Admitting: Family Medicine

## 2022-01-09 DIAGNOSIS — R9389 Abnormal findings on diagnostic imaging of other specified body structures: Secondary | ICD-10-CM

## 2022-01-09 DIAGNOSIS — J189 Pneumonia, unspecified organism: Secondary | ICD-10-CM

## 2022-01-09 DIAGNOSIS — R0609 Other forms of dyspnea: Secondary | ICD-10-CM

## 2022-01-09 DIAGNOSIS — I4891 Unspecified atrial fibrillation: Secondary | ICD-10-CM

## 2022-01-09 MED ORDER — CEFPROZIL 500 MG PO TABS: 500.0000 mg | ORAL_TABLET | Freq: Two times a day (BID) | ORAL | 0 refills | Status: DC

## 2022-01-09 MED ORDER — APIXABAN 2.5 MG PO TABS: 2.5000 mg | ORAL_TABLET | Freq: Two times a day (BID) | ORAL | 4 refills | Status: DC

## 2022-01-09 NOTE — Telephone Encounter (Signed)
Nurses I sent in [place] already for the patient Please order the following CT scan chest without contrast due to persistent pneumonia Echo of the heart due to atrial fibrillation   Side note Eliquis 2.5 mg twice daily was sent in Also Cefzil twice daily for pneumonia sent in Patient does have follow-up later this month Patient will do home COVID test because of her symptoms I will touch base with her on Saturday to get result

## 2022-01-12 NOTE — Telephone Encounter (Signed)
CT Chest and Echo ordered in Epic- await precert and schedule

## 2022-01-21 ENCOUNTER — Ambulatory Visit (INDEPENDENT_AMBULATORY_CARE_PROVIDER_SITE_OTHER): Payer: PPO | Admitting: Family Medicine

## 2022-01-21 ENCOUNTER — Other Ambulatory Visit: Payer: Self-pay

## 2022-01-21 VITALS — BP 128/78 | HR 88 | Temp 97.4°F | Ht 65.0 in | Wt 186.4 lb

## 2022-01-21 DIAGNOSIS — R059 Cough, unspecified: Secondary | ICD-10-CM | POA: Diagnosis not present

## 2022-01-21 DIAGNOSIS — C9110 Chronic lymphocytic leukemia of B-cell type not having achieved remission: Secondary | ICD-10-CM | POA: Diagnosis not present

## 2022-01-21 DIAGNOSIS — D72829 Elevated white blood cell count, unspecified: Secondary | ICD-10-CM

## 2022-01-21 DIAGNOSIS — I4891 Unspecified atrial fibrillation: Secondary | ICD-10-CM | POA: Diagnosis not present

## 2022-01-21 DIAGNOSIS — D72820 Lymphocytosis (symptomatic): Secondary | ICD-10-CM | POA: Insufficient documentation

## 2022-01-21 NOTE — Progress Notes (Signed)
° °  Subjective:    Patient ID: [name], female    DOB: Jan 18, 1936, 86 y.o.   MRN: [id number]  HPI  Patient says she had pneumonia and doing better then she was. She says she is still short winded but nothing like she was. Some coughing and blowing nose.  Here today for follow-up of atrial fibrillation She has had a persistent cough for several weeks has been treated with antibiotics for pneumonia but has ongoing cough and some markings on chest x-ray will have a CAT scan next week scheduled to have echo later in March with a follow-up visit with cardiology Review of Systems     Objective:   Physical Exam General-in no acute distress Eyes-no discharge Lungs-respiratory rate normal, CTA CV-no murmurs irregular rhythm but rate is controlled Extremities skin warm dry no edema Neuro grossly normal Behavior normal, alert        Assessment & Plan:  1. Atrial fibrillation, unspecified type [facility]) Will have echo in March we will see cardiology later in March we will follow-up with [place] in April tolerating anticoagulants well no bleeding issues  2. Leukocytosis, unspecified type Patient with leukocytosis was diagnosed with CLL Patient was seen recently by hematology here in town but she would like to get established with the hematology group in [place] where her husband went as well when he was living - Ambulatory referral to Hematology / Oncology  3. Cough in adult CT scan indicated await the results coming up this coming week no further antibiotics currently

## 2022-01-26 ENCOUNTER — Other Ambulatory Visit: Payer: Self-pay

## 2022-01-26 ENCOUNTER — Ambulatory Visit (HOSPITAL_COMMUNITY)
Admission: RE | Admit: 2022-01-26 | Discharge: 2022-01-26 | Disposition: A | Discharge status: Home / Self Care | Payer: PPO | Source: Ambulatory Visit | Attending: Family Medicine | Admitting: Family Medicine

## 2022-01-26 DIAGNOSIS — J189 Pneumonia, unspecified organism: Secondary | ICD-10-CM | POA: Diagnosis not present

## 2022-01-26 DIAGNOSIS — R9389 Abnormal findings on diagnostic imaging of other specified body structures: Secondary | ICD-10-CM | POA: Diagnosis not present

## 2022-01-30 ENCOUNTER — Telehealth: Payer: Self-pay | Admitting: Hematology

## 2022-01-30 NOTE — Telephone Encounter (Signed)
Scheduled appt per 2/22 referral. Pt is aware of appt date and time. Pt is aware to arrive 15 mins prior to appt time and to bring and updated insurance card. Pt is aware of appt location.   

## 2022-02-02 ENCOUNTER — Inpatient Hospital Stay: Payer: PPO | Attending: Hematology | Admitting: Hematology

## 2022-02-02 ENCOUNTER — Other Ambulatory Visit: Payer: Self-pay

## 2022-02-02 ENCOUNTER — Inpatient Hospital Stay: Payer: PPO

## 2022-02-02 VITALS — BP 115/103 | HR 95 | Temp 97.9°F | Resp 20 | Wt 189.7 lb

## 2022-02-02 DIAGNOSIS — E785 Hyperlipidemia, unspecified: Secondary | ICD-10-CM | POA: Insufficient documentation

## 2022-02-02 DIAGNOSIS — D72820 Lymphocytosis (symptomatic): Secondary | ICD-10-CM | POA: Diagnosis not present

## 2022-02-02 DIAGNOSIS — I4891 Unspecified atrial fibrillation: Secondary | ICD-10-CM | POA: Diagnosis not present

## 2022-02-02 DIAGNOSIS — R5383 Other fatigue: Secondary | ICD-10-CM | POA: Diagnosis not present

## 2022-02-02 DIAGNOSIS — E039 Hypothyroidism, unspecified: Secondary | ICD-10-CM

## 2022-02-02 DIAGNOSIS — Z87440 Personal history of urinary (tract) infections: Secondary | ICD-10-CM | POA: Insufficient documentation

## 2022-02-02 DIAGNOSIS — Z7901 Long term (current) use of anticoagulants: Secondary | ICD-10-CM | POA: Diagnosis not present

## 2022-02-02 DIAGNOSIS — R0602 Shortness of breath: Secondary | ICD-10-CM | POA: Insufficient documentation

## 2022-02-02 DIAGNOSIS — D479 Neoplasm of uncertain behavior of lymphoid, hematopoietic and related tissue, unspecified: Secondary | ICD-10-CM | POA: Diagnosis not present

## 2022-02-02 DIAGNOSIS — K449 Diaphragmatic hernia without obstruction or gangrene: Secondary | ICD-10-CM | POA: Diagnosis not present

## 2022-02-02 DIAGNOSIS — I10 Essential (primary) hypertension: Secondary | ICD-10-CM | POA: Diagnosis not present

## 2022-02-02 DIAGNOSIS — Z79899 Other long term (current) drug therapy: Secondary | ICD-10-CM | POA: Diagnosis not present

## 2022-02-02 LAB — CMP (CANCER CENTER ONLY)
ALT: 12 U/L (ref 0–44)
AST: 18 U/L (ref 15–41)
Albumin: 4.2 g/dL (ref 3.5–5.0)
Alkaline Phosphatase: 57 U/L (ref 38–126)
Anion gap: 7 (ref 5–15)
BUN: 15 mg/dL (ref 8–23)
CO2: 30 mmol/L (ref 22–32)
Calcium: 9.9 mg/dL (ref 8.9–10.3)
Chloride: 100 mmol/L (ref 98–111)
Creatinine: 0.84 mg/dL (ref 0.44–1.00)
GFR, Estimated: >60 mL/min (ref >60)
Glucose, Bld: 95 mg/dL (ref 70–99)
Potassium: 4.1 mmol/L (ref 3.5–5.1)
Sodium: 137 mmol/L (ref 135–145)
Total Bilirubin: 0.7 mg/dL (ref 0.3–1.2)
Total Protein: 7.6 g/dL (ref 6.5–8.1)

## 2022-02-02 LAB — CBC WITH DIFFERENTIAL/PLATELET
Abs Immature Granulocytes: 0.02 10*3/uL (ref 0.00–0.07)
Basophils Absolute: 0 10*3/uL (ref 0.0–0.1)
Basophils Relative: 0 %
Eosinophils Absolute: 0.3 10*3/uL (ref 0.0–0.5)
Eosinophils Relative: 2 %
HCT: 36.2 % (ref 36.0–46.0)
Hemoglobin: 11.6 g/dL — ABNORMAL LOW (ref 12.0–15.0)
Immature Granulocytes: 0 %
Lymphocytes Relative: 43 %
Lymphs Abs: 4.7 10*3/uL — ABNORMAL HIGH (ref 0.7–4.0)
MCH: 28.2 pg (ref 26.0–34.0)
MCHC: 32 g/dL (ref 30.0–36.0)
MCV: 88.1 fL (ref 80.0–100.0)
Monocytes Absolute: 0.8 10*3/uL (ref 0.1–1.0)
Monocytes Relative: 7 %
Neutro Abs: 5.2 10*3/uL (ref 1.7–7.7)
Neutrophils Relative %: 48 %
Platelets: 318 10*3/uL (ref 150–400)
RBC: 4.11 MIL/uL (ref 3.87–5.11)
RDW: 13.7 % (ref 11.5–15.5)
WBC: 11 10*3/uL — ABNORMAL HIGH (ref 4.0–10.5)
nRBC: 0 % (ref 0.0–0.2)

## 2022-02-02 LAB — LACTATE DEHYDROGENASE: LDH: 178 U/L (ref 98–192)

## 2022-02-02 LAB — HEPATITIS C ANTIBODY: HCV Ab: NONREACTIVE

## 2022-02-02 LAB — HEPATITIS B SURFACE ANTIGEN: Hepatitis B Surface Ag: NONREACTIVE

## 2022-02-02 LAB — HEPATITIS B CORE ANTIBODY, TOTAL: Hep B Core Total Ab: NONREACTIVE

## 2022-02-02 NOTE — Progress Notes (Addendum)
[facility]   HEMATOLOGY/ONCOLOGY CONSULTATION NOTE  Date of Service: 02/02/2022  Patient Care Team: Kathyrn Drown, MD as PCP - General (Family Medicine) Gala Romney, Cristopher Estimable, MD as Consulting Physician (Gastroenterology) Derek Jack, MD as Medical Oncologist (Hematology)  CHIEF COMPLAINTS/PURPOSE OF CONSULTATION:  B-cell chronic lymphoproliferative disorder  HISTORY OF PRESENTING ILLNESS:   [name] is a wonderful 86 y.o. female who has been referred to [place] by Dr .Wolfgang Phoenix, Elayne Snare, MD for evaluation and management of leukocytosis.  She has a history of hiatal hernia, hypertension, dyslipidemia, hypothyroidism who was noted to have lower urinary tract infection for which she was treated in urgent care on 12/03/2021 with ciprofloxacin and lower respiratory tract infection for which she visited urgent care on 1/15-2023 for which she was treated with doxycycline albuterol as needed and Mucinex.  Patient was also noted to have atrial fibrillation since her pneumonia and has been placed on Eliquis. She had a CT of the chest without contrast with Dr. Wolfgang Phoenix in light of persistent radiographic changes of pneumonia. This showed cardiomegaly, moderate-sized hiatal hernia and mild bilateral patchy interstitial lung disease in the peripheral areas compatible with chronic change cannot rule out pneumonitis.  Patient notes that she has been referred for an echocardiogram due to her cardiomegaly.  She also has been placed on diuretics per her primary care physician.  As part of her labs she has been noted to have some leukocytosis ranging between 10-13.7 over the last year with primarily lymphocytosis ranging from 4.5 through 6.9k,  She has been referred to [place] for further evaluation regarding this.  Patient currently notes no fevers no chills no night sweats no unexpected weight loss. Still has some fatigue and some mild shortness of breath with exertion. No reported new lumps or bumps. No reported  new abdominal pain or distention.   Patient was seen at an outside hospital in [place] by Dr. Delton Coombes and had a flow cytometry done on 09/05/2021 which showed a monoclonal B-cell population representing 27% of all lymphocytes.  Lymphocyte population lacked CD10 and had questionable dim CD5 expression, expresses pan B7 antigens including CD20.   MEDICAL HISTORY:  Past Medical History:  Diagnosis Date   Chronic Leukemia    Depression    H/O hiatal hernia    Hernia    Hernia    right inguinal hernia   Hypertension    Hypothyroidism    Leg swelling    Osteitis pubis ([place]) 05/30/2001   Osteopenia 03/30/2009   Thyroid disease     SURGICAL HISTORY: Past Surgical History:  Procedure Laterality Date   ABDOMINAL HYSTERECTOMY  03/1970   APPENDECTOMY  03/1966   BILATERAL SALPINGOOPHORECTOMY     CATARACT EXTRACTION W/PHACO Right 09/25/2014   Procedure: CATARACT EXTRACTION PHACO AND INTRAOCULAR LENS PLACEMENT ([place]);  Surgeon: Elta Guadeloupe T. Gershon Crane, MD;  Location: [facility];  Service: Ophthalmology;  Laterality: Right;  CDE:7.56   CATARACT EXTRACTION W/PHACO Left 10/09/2014   Procedure: CATARACT EXTRACTION PHACO AND INTRAOCULAR LENS PLACEMENT LEFT EYE CDE=6.51;  Surgeon: Elta Guadeloupe T. Gershon Crane, MD;  Location: [facility];  Service: Ophthalmology;  Laterality: Left;   Micro   COLONOSCOPY  March 2007   Repeat 10 years   COLONOSCOPY  11/06/2003    Internal and external hemorrhoids/Two tiny rectal distal sigmoid polyps cold biopsied/Otherwise within normal limits to the cecum   COLONOSCOPY N/A 08/29/2014   Dr. Gala Romney: Sessile polyp was found at the cecum status post cold snare polypectomy, tubular  adenoma. No further surveillance unless clinical status changes   ESOPHAGOGASTRODUODENOSCOPY  2007   Dr. Gala Romney: normal esophagus, small tom oderate sized hiatal hernia, [place] lesions   SKIN CANCER EXCISION Left 11 1 2016   left arm squamous cancer   TUMOR REMOVAL  03/1968    ovarian    SOCIAL HISTORY: Social History   Socioeconomic History   Marital status: Married    Spouse name: Not on file   Number of children: Not on file   Years of education: Not on file   Highest education level: Not on file  Occupational History   Occupation: Retired    Fish farm manager: RETIRED    Comment: Orthoptist, retired in [place] Use   Smoking status: Never   Smokeless tobacco: Never   Tobacco comments:    Never smoked  Substance and Sexual Activity   Alcohol use: Yes    Alcohol/week: 0.0 standard drinks    Comment: occasional margarita   Drug use: No   Sexual activity: Yes    Birth control/protection: Surgical  Other Topics Concern   Not on file  Social History Narrative   Not on file   Social Determinants of Health   Financial Resource Strain: Low Risk    Difficulty of Paying Living Expenses: Not hard at all  Food Insecurity: No Food Insecurity   Worried About Charity fundraiser in the Last Year: Never true   [place] in the Last Year: Never true  Transportation Needs: No Transportation Needs   Lack of Transportation (Medical): No   Lack of Transportation (Non-Medical): No  Physical Activity: Sufficiently Active   Days of Exercise per Week: 5 days   Minutes of Exercise per Session: 30 min  Stress: No Stress Concern Present   Feeling of Stress : Only a little  Social Connections: Engineer, building services of Communication with Friends and Family: More than three times a week   Frequency of Social Gatherings with Friends and Family: More than three times a week   Attends Religious Services: 1 to 4 times per year   Active Member of [employer] or Organizations: Yes   Attends Archivist Meetings: 1 to 4 times per year   Marital Status: Married  Human resources officer Violence: Not At Risk   Fear of Current or Ex-Partner: No   Emotionally Abused: No   Physically Abused: No   Sexually Abused: No    FAMILY HISTORY: Family History  Problem  Relation Age of Onset   Dementia Mother    Heart disease Father    Cancer Father        throat   Cancer Sister        breast   Colon cancer Neg Hx     ALLERGIES:  is allergic to erythromycin, aspirin, and ibuprofen.  MEDICATIONS:  Current Outpatient Medications  Medication Sig Dispense Refill   albuterol (VENTOLIN HFA) 108 (90 Base) MCG/ACT inhaler Inhale 2 puffs into the lungs every 6 (six) hours as needed for wheezing or shortness of breath. 18 g 1   alendronate (FOSAMAX) 70 MG tablet TAKE 1 TABLET(70 MG) BY MOUTH EVERY 7 DAYS WITH A FULL GLASS OF WATER AND ON AN EMPTY STOMACH 4 tablet 5   ALPRAZolam (XANAX) 0.5 MG tablet 1 bid prn 60 tablet 2   apixaban (ELIQUIS) 2.5 MG TABS tablet Take 1 tablet (2.5 mg total) by mouth 2 (two) times daily. 60 tablet 4   Cholecalciferol (VITAMIN D3) 2000  UNITS TABS Take 2 tablets by mouth daily.      indapamide (LOZOL) 1.25 MG tablet TAKE 1 TABLET BY MOUTH EVERY MORNING 90 tablet 1   levothyroxine (SYNTHROID) 125 MCG tablet Take one tablet by mouth daily 90 tablet 1   metoprolol succinate (TOPROL-XL) 25 MG 24 hr tablet Take one tablet daily 90 tablet 1   Multiple Vitamin (MULTI-VITAMIN DAILY PO) Take by mouth.     pantoprazole (PROTONIX) 40 MG tablet TAKE 1 TABLET(40 MG) BY MOUTH DAILY 90 tablet 1   potassium chloride (KLOR-CON M) 10 MEQ tablet TAKE 1 TABLET(10 MEQ) BY MOUTH DAILY 90 tablet 1   sertraline (ZOLOFT) 50 MG tablet TAKE 1 TABLET(50 MG) BY MOUTH DAILY 90 tablet 1   No current facility-administered medications for this visit.    REVIEW OF SYSTEMS:    10 Point review of Systems was done is negative except as noted above.  PHYSICAL EXAMINATION: ECOG PERFORMANCE STATUS: 1 - Symptomatic but completely ambulatory  . Vitals:   02/02/22 1050  BP: (!) 115/103  Pulse: 95  Resp: 20  Temp: 97.9 F (36.6 C)  SpO2: 97%   Filed Weights   02/02/22 1050  Weight: 189 lb 11.2 oz (86 kg)   .Body mass index is 31.57  kg/m.  GENERAL:alert, in no acute distress and comfortable SKIN: no acute rashes, no significant lesions EYES: conjunctiva are pink and non-injected, sclera anicteric OROPHARYNX: MMM, no exudates, no oropharyngeal erythema or ulceration NECK: supple, no JVD LYMPH:  no palpable lymphadenopathy in the cervical, axillary or inguinal regions LUNGS: clear to auscultation b/l with normal respiratory effort HEART: regular rate & rhythm ABDOMEN:  normoactive bowel sounds , non tender, not distended.  No palpable hepatosplenomegaly Extremity: no pedal edema PSYCH: alert & oriented x 3 with fluent speech NEURO: no focal motor/sensory deficits  LABORATORY DATA:  I have reviewed the data as listed  . CBC Latest Ref Rng & Units 02/02/2022 01/07/2022 09/05/2021  WBC 4.0 - 10.5 K/uL 11.0(H) 10.6 10.9(H)  Hemoglobin 12.0 - 15.0 g/dL 11.6(L) 12.3 11.7(L)  Hematocrit 36.0 - 46.0 % 36.2 38.6 36.1  Platelets 150 - 400 K/uL 318 425 392  .CBC    Component Value Date/Time   WBC 11.0 (H) 02/02/2022 1217   RBC 4.11 02/02/2022 1217   HGB 11.6 (L) 02/02/2022 1217   HGB 12.3 01/07/2022 1204   HCT 36.2 02/02/2022 1217   HCT 38.6 01/07/2022 1204   PLT 318 02/02/2022 1217   PLT 425 01/07/2022 1204   MCV 88.1 02/02/2022 1217   MCV 89 01/07/2022 1204   MCH 28.2 02/02/2022 1217   MCHC 32.0 02/02/2022 1217   RDW 13.7 02/02/2022 1217   RDW 12.5 01/07/2022 1204   LYMPHSABS 4.7 (H) 02/02/2022 1217   LYMPHSABS 4.3 (H) 01/07/2022 1204   MONOABS 0.8 02/02/2022 1217   EOSABS 0.3 02/02/2022 1217   EOSABS 0.4 01/07/2022 1204   BASOSABS 0.0 02/02/2022 1217   BASOSABS 0.1 01/07/2022 1204     CMP Latest Ref Rng & Units 01/07/2022 10/07/2021 06/03/2021  Glucose 70 - 99 mg/dL 100(H) 88 92  BUN 8 - 27 mg/dL '11 11 16  '$ Creatinine 0.57 - 1.00 mg/dL 0.95 0.81 1.05(H)  Sodium 134 - 144 mmol/L 136 137 139  Potassium 3.5 - 5.2 mmol/L 4.8 4.7 3.7  Chloride 96 - 106 mmol/L 95(L) 96 100  CO2 20 - 29 mmol/L '27 27 25  '$ Calcium  8.7 - 10.3 mg/dL 9.6 9.4 9.2  Total Protein 6.0 -  8.5 g/dL 7.2 - -  Total Bilirubin 0.0 - 1.2 mg/dL 0.3 - -  Alkaline Phos 44 - 121 IU/L 73 - -  AST 0 - 40 IU/L 20 - -  ALT 0 - 32 IU/L 13 - -   SURGICAL PATHOLOGY Surgical Pathology  CASE: [id number]  PATIENT: Berda Gulla  Flow Pathology Report      Clinical history: Lymphocytosis      DIAGNOSIS:   -Monoclonal B-cell population identified  -See comment   COMMENT:   Flow cytometric analysis of the lymphoid population shows a monoclonal,  lambda restricted B-cell population representing 27% of all lymphocytes.  This population expresses pan-B-cell antigens including CD20 with lack  of CD10, CD25 or CD103 expression.  There is questionable dim CD5  expression.  The findings are consistent with a B-cell  lymphoproliferative process and may represent monoclonal B-cell  lymphocytosis especially in the absence of lymphadenopathy, organomegaly  or extra medullary tissue involvement.  Clinical correlation is  recommended.   GATING AND PHENOTYPIC ANALYSIS:   Gated population: Flow cytometric immunophenotyping is performed using  antibodies to the antigens listed in the table below. Electronic gates  are placed around a cell cluster displaying light scatter properties  corresponding to: lymphocytes (total lymphocyte count is 5.6 K/uL)   Abnormal Cells in gated population: 27%   Phenotype of Abnormal Cells: CD19, CD20, CD22, CD200, Lambda     RADIOGRAPHIC STUDIES: I have personally reviewed the radiological images as listed and agreed with the findings in the report.  DG Chest 2 View  Result Date: 01/07/2022 CLINICAL DATA:  follow up pneumonia EXAM: CHEST - 2 VIEW COMPARISON:  12/14/2021. FINDINGS: Persistent and mildly improved patchy interstitial and airspace opacities. Similar mild enlargement the cardiac silhouette. No visible pleural effusions or pneumothorax. Clips project in the left upper abdominal region.  Probable hiatal hernia. Polyarticular degenerative change. IMPRESSION: 1. Persistent and mildly improved patchy interstitial and airspace opacities, possibly atelectasis/scarring and/or pneumonia. Given persistence, consider CT to further evaluate. 2. Cardiomegaly. Electronically Signed   By: Margaretha Sheffield M.D.   On: 01/07/2022 12:37   CT Chest Wo Contrast  Result Date: 01/27/2022 CLINICAL DATA:  Persistent chest radiograph lung changes following pneumonia. EXAM: CT CHEST WITHOUT CONTRAST TECHNIQUE: Multidetector CT imaging of the chest was performed following the standard protocol without IV contrast. RADIATION DOSE REDUCTION: This exam was performed according to the departmental dose-optimization program which includes automated exposure control, adjustment of the mA and/or kV according to patient size and/or use of iterative reconstruction technique. COMPARISON:  Chest radiographs dated 01/07/2022. FINDINGS: Cardiovascular: Enlarged heart. Atheromatous calcifications including the thoracic aorta. Mediastinum/Nodes: Moderate-sized hiatal hernia with multiple adjacent surgical clips. No enlarged lymph nodes. Unremarkable esophagus. Lungs/Pleura: Mild bilateral peripheral, patchy interstitial opacities. No airspace consolidation or pleural fluid. Upper Abdomen: Cholecystectomy clips. Multiple surgical clips in the region of the proximal stomach and spleen. Musculoskeletal: Thoracic and lower cervical spine degenerative changes. IMPRESSION: 1. Mild bilateral patchy interstitial lung disease in the periphery of the lungs, compatible with chronic changes and possible ongoing pneumonitis. 2. No evidence of pneumonia. 3. Cardiomegaly. 4. Moderate-sized hiatal hernia. Aortic Atherosclerosis (ICD10-I70.0). Electronically Signed   By: Claudie Revering M.D.   On: 01/27/2022 10:57    ASSESSMENT & PLAN:  86 year old with  #1 mild lymphocytosis with evidence of B-cell chronic lymphoproliferative process Flow  cytometry in October 2022 showed 27% of lymphocytes represented a monoclonal population that had pan B-cell antigens including CD20, negative for CD10 and had questionable dim  CD5 expression.  This could represent monoclonal B-cell lymphocytosis versus CLL versus other low-grade chronic lymphoproliferative disorder. Plan -I discussed all the available labs and imaging studies with the patient. -We discussed likely diagnosis of monoclonal B lymphocytosis. -Patient clinically does not have lymphadenopathy or splenomegaly to suggest diagnosis of CLL and the absolute number of clonal B lymphocytes does not cross 5000 per cubic microliter to suggest CLL. -We will need to get PET CT scan prior to next visit to stage her disease process and to evaluate for splenomegaly since splenic marginal zone lymphoma is also in the differential -Labs ordered for today as noted below.  Orders Placed This Encounter  Procedures   NM PET Image Initial (PI) Skull Base To Thigh    Standing Status:   Future    Standing Expiration Date:   06/04/2022    Order Specific Question:   If indicated for the ordered procedure, I authorize the administration of a radiopharmaceutical per Radiology protocol    Answer:   Yes    Order Specific Question:   Preferred imaging location?    Answer:   [place]   CBC with Differential/Platelet    Standing Status:   Future    Number of Occurrences:   1    Standing Expiration Date:   02/03/2023   CMP ([place] only)    Standing Status:   Future    Number of Occurrences:   1    Standing Expiration Date:   02/03/2023   Lactate dehydrogenase    Standing Status:   Future    Number of Occurrences:   1    Standing Expiration Date:   02/03/2023   FISH, CLL Prognostic Panel    Standing Status:   Future    Number of Occurrences:   1    Standing Expiration Date:   02/03/2023   Hepatitis C antibody    Standing Status:   Future    Number of Occurrences:   1    Standing Expiration Date:    02/03/2023   Hepatitis B surface antigen    Standing Status:   Future    Number of Occurrences:   1    Standing Expiration Date:   02/03/2023   Hepatitis B core antibody, total    Standing Status:   Future    Number of Occurrences:   1    Standing Expiration Date:   02/03/2023    Follow-up Labs today PET/CT scan in 14 weeks Return to clinic with Dr. Irene Limbo in 16 weeks All of the patients questions were answered with apparent satisfaction. The patient knows to call the clinic with any problems, questions or concerns.    The total time spent in the appointment was 47 minutes*.  All of the patient's questions were answered with apparent satisfaction. The patient knows to call the clinic with any problems, questions or concerns.   Sullivan Lone MD MS AAHIVMS [facility] Hematology/Oncology Physician [facility]  .*Total Encounter Time as defined by the Centers for Medicare and Medicaid Services includes, in addition to the face-to-face time of a patient visit (documented in the note above) non-face-to-face time: obtaining and reviewing outside history, ordering and reviewing medications, tests or procedures, care coordination (communications with other health care professionals or caregivers) and documentation in the medical record.

## 2022-02-03 ENCOUNTER — Telehealth: Payer: Self-pay | Admitting: Hematology

## 2022-02-03 NOTE — Telephone Encounter (Signed)
Scheduled follow-up appointment per 3/6 los. Patient is aware. ?

## 2022-02-04 ENCOUNTER — Ambulatory Visit: Payer: PPO | Admitting: Family Medicine

## 2022-02-06 LAB — FISH,CLL PROGNOSTIC PANEL

## 2022-02-10 ENCOUNTER — Ambulatory Visit (HOSPITAL_COMMUNITY)
Admission: RE | Admit: 2022-02-10 | Discharge: 2022-02-10 | Disposition: A | Discharge status: Home / Self Care | Payer: PPO | Source: Ambulatory Visit | Attending: Family Medicine | Admitting: Family Medicine

## 2022-02-10 DIAGNOSIS — R0609 Other forms of dyspnea: Secondary | ICD-10-CM | POA: Diagnosis not present

## 2022-02-10 DIAGNOSIS — I4891 Unspecified atrial fibrillation: Secondary | ICD-10-CM | POA: Insufficient documentation

## 2022-02-10 LAB — ECHOCARDIOGRAM COMPLETE
Area-P 1/2: 3.77 cm2
MV M vel: 4.39 m/s
MV Peak grad: 77.1 mmHg
S' Lateral: 3.1 cm

## 2022-02-10 NOTE — Progress Notes (Signed)
*  PRELIMINARY RESULTS* ?Echocardiogram ?2D Echocardiogram has been performed. ? ?[name] ?02/10/2022, 1:58 PM ?

## 2022-02-18 ENCOUNTER — Ambulatory Visit: Payer: PPO | Admitting: Cardiovascular Disease

## 2022-02-18 ENCOUNTER — Encounter: Payer: Self-pay | Admitting: Cardiovascular Disease

## 2022-02-18 ENCOUNTER — Other Ambulatory Visit: Payer: Self-pay

## 2022-02-18 VITALS — BP 136/82 | HR 87 | Ht 65.5 in | Wt 187.8 lb

## 2022-02-18 DIAGNOSIS — R079 Chest pain, unspecified: Secondary | ICD-10-CM

## 2022-02-18 DIAGNOSIS — I4819 Other persistent atrial fibrillation: Secondary | ICD-10-CM | POA: Diagnosis not present

## 2022-02-18 DIAGNOSIS — E039 Hypothyroidism, unspecified: Secondary | ICD-10-CM | POA: Diagnosis not present

## 2022-02-18 DIAGNOSIS — R0602 Shortness of breath: Secondary | ICD-10-CM

## 2022-02-18 DIAGNOSIS — I10 Essential (primary) hypertension: Secondary | ICD-10-CM

## 2022-02-18 DIAGNOSIS — I4891 Unspecified atrial fibrillation: Secondary | ICD-10-CM

## 2022-02-18 DIAGNOSIS — D479 Neoplasm of uncertain behavior of lymphoid, hematopoietic and related tissue, unspecified: Secondary | ICD-10-CM | POA: Diagnosis not present

## 2022-02-18 DIAGNOSIS — I5032 Chronic diastolic (congestive) heart failure: Secondary | ICD-10-CM | POA: Diagnosis not present

## 2022-02-18 DIAGNOSIS — D6869 Other thrombophilia: Secondary | ICD-10-CM | POA: Diagnosis not present

## 2022-02-18 NOTE — H&P (View-Only) (Signed)
Patient ID: [name], female   DOB: 03-24-1936, 86 y.o.   MRN: [id number] ? ?  ?  ?Cardiology Office Note ? ? ?Date:  02/19/2022  ? ?ID:  [name], DOB [birth year], MRN [id number] ? ?PCP:  Kathyrn Drown, MD  ?Cardiologist:   Sanda Klein, MD  ? ?Chief Complaint  ?Patient presents with  ? Shortness of Breath  ? Chest Pain  ? ?[name] is a 86 y.o. female who is being seen today for the evaluation of new onset atrial fibrillation at the request of Luking, Elayne Snare, MD. ? ?  ?History of Present Illness: ?[name] is a 86 y.o. female who presents for evaluation of exertional dyspnea and an episode of chest pain. ? ?I took care of her husband, WF, for many years.  He passed away last 07/27/21.  They were married for 29 years.  She continues to live in the same home and is accompanied to the office today by her daughter. ? ?She complains of worsening shortness of breath over the last several months.  This started off as NYHA functional class II dyspnea, but now she will become short of breath making the bed.  She has to stop 3 times to cover the 150 steps to her mailbox.  She does not have shortness of breath at rest, orthopnea, PND or lower extremity edema. ? ?She had an episode of pneumonia in January (fever, persistent cough, fatigue) and was treated with doxycycline.  She was in normal sinus rhythm when she was seen in the emergency room on January 15.  She developed atrial fibrillation, detected at her follow-up visit with Dr. Wolfgang Phoenix on January 07, 2022.  He started her on anticoagulation with Eliquis, which she has been taking since then.  She was spontaneously rate controlled (on metoprolol succinate 25 mg once daily).  She was unaware of the palpitations. ? ?She has had 1 episode of severe chest pain in the last month.  This occurred while she was driving and lasted for about 8 or 9 minutes.  She has not had exertional chest pain and the resting chest pain has not recurred. ? ?Recently  she is also been diagnosed with lymphoproliferative disorder, possible CLL and has a follow-up visit with the [facility] on May 15. ? ?She has a long-standing history of systemic hypertension, although she only started taking antihypertensives medications at the age of 61. She has well treated hypothyroidism history of hiatal hernia and osteopenia but has otherwise been remarkably healthy.  She is borderline obese with a BMI just over 30. ? ?About 20 years ago she had a chest x-ray that described cardiomegaly but this was not investigated until 2016 when she had an echocardiogram.  This demonstrated that her cardiomegaly was due to mild biatrial dilation.  Her PA pressure was mildly elevated at 34 mmHg, left ventricular systolic function was normal and there were only very subtle signs of diastolic dysfunction.  She was asymptomatic at the time.  She had another echocardiogram in June 2022.  PA pressure was described as normal and the left atrium was described as moderately dilated.  LV EF remains normal at 60 to 65%. ? ?Finally she had an echocardiogram performed 02/10/2022.  Once again she has normal left ventricular systolic function with a EF of 55 to 60% and there is normal regional wall motion.  There is mild LVH.  Diastolic function could not be assessed due to atrial fibrillation.  Pulmonary pressure was estimated substantially higher at 46.7 mmHg.  Both atria were severely dilated.  Again, there were no significant valvular abnormalities, although now there is moderate TR. ? ? ?Past Medical History:  ?Diagnosis Date  ? Chronic Leukemia   ? Depression   ? H/O hiatal hernia   ? Hernia   ? Hernia   ? right inguinal hernia  ? Hypertension   ? Hypothyroidism   ? Leg swelling   ? Osteitis pubis ([place]) 05/30/2001  ? Osteopenia 03/30/2009  ? Thyroid disease   ? ? ?Past Surgical History:  ?Procedure Laterality Date  ? ABDOMINAL HYSTERECTOMY  03/1970  ? APPENDECTOMY  03/1966  ? BILATERAL  SALPINGOOPHORECTOMY    ? CATARACT EXTRACTION W/PHACO Right 09/25/2014  ? Procedure: CATARACT EXTRACTION PHACO AND INTRAOCULAR LENS PLACEMENT (IOC);  Surgeon: Elta Guadeloupe T. Gershon Crane, MD;  Location: [facility];  Service: Ophthalmology;  Laterality: Right;  CDE:7.56  ? CATARACT EXTRACTION W/PHACO Left 10/09/2014  ? Procedure: CATARACT EXTRACTION PHACO AND INTRAOCULAR LENS PLACEMENT LEFT EYE CDE=6.51;  Surgeon: Elta Guadeloupe T. Gershon Crane, MD;  Location: [facility];  Service: Ophthalmology;  Laterality: Left;  ? CHOLECYSTECTOMY  1980  ? COLONOSCOPY  1999  ? COLONOSCOPY  March 2007  ? Repeat 10 years  ? COLONOSCOPY  11/06/2003  ?  Internal and external hemorrhoids/Two tiny rectal distal sigmoid polyps cold biopsied/Otherwise within normal limits to the cecum  ? COLONOSCOPY N/A 08/29/2014  ? Dr. Gala Romney: Sessile polyp was found at the cecum status post cold snare polypectomy, tubular adenoma. No further surveillance unless clinical status changes  ? ESOPHAGOGASTRODUODENOSCOPY  2007  ? Dr. Gala Romney: normal esophagus, small tom oderate sized hiatal hernia, Lysbeth Galas lesions  ? SKIN CANCER EXCISION Left 11 1 2016  ? left arm squamous cancer  ? TUMOR REMOVAL  03/1968  ? ovarian  ? ? ? ?Current Outpatient Medications  ?Medication Sig Dispense Refill  ? albuterol (VENTOLIN HFA) 108 (90 Base) MCG/ACT inhaler Inhale 2 puffs into the lungs every 6 (six) hours as needed for wheezing or shortness of breath. 18 g 1  ? alendronate (FOSAMAX) 70 MG tablet TAKE 1 TABLET(70 MG) BY MOUTH EVERY 7 DAYS WITH A FULL GLASS OF WATER AND ON AN EMPTY STOMACH 4 tablet 5  ? ALPRAZolam (XANAX) 0.5 MG tablet 1 bid prn 60 tablet 2  ? apixaban (ELIQUIS) 5 MG TABS tablet Take 1 tablet (5 mg total) by mouth 2 (two) times daily. 60 tablet 11  ? Cholecalciferol (VITAMIN D3) 2000 UNITS TABS Take 2 tablets by mouth daily.     ? indapamide (LOZOL) 1.25 MG tablet TAKE 1 TABLET BY MOUTH EVERY MORNING 90 tablet 1  ? levothyroxine (SYNTHROID) 125 MCG tablet Take one tablet by mouth daily 90 tablet 1   ? metoprolol succinate (TOPROL-XL) 25 MG 24 hr tablet Take one tablet daily 90 tablet 1  ? Multiple Vitamin (MULTI-VITAMIN DAILY PO) Take by mouth.    ? pantoprazole (PROTONIX) 40 MG tablet TAKE 1 TABLET(40 MG) BY MOUTH DAILY 90 tablet 1  ? potassium chloride (KLOR-CON M) 10 MEQ tablet TAKE 1 TABLET(10 MEQ) BY MOUTH DAILY 90 tablet 1  ? sertraline (ZOLOFT) 50 MG tablet TAKE 1 TABLET(50 MG) BY MOUTH DAILY 90 tablet 1  ? ?No current facility-administered medications for this visit.  ? ? ?Allergies:   Erythromycin, Aspirin, and Ibuprofen  ? ? ?Social History:  The patient  reports that she has never smoked. She has never used smokeless tobacco. She reports current alcohol use. She  reports that she does not use drugs.  ? ?Family History:  The patient's family history includes Cancer in her father and sister; Dementia in her mother; Heart disease in her father.  ? ? ?ROS:  Please see the history of present illness.    ?Otherwise, review of systems positive for none.   ?All other systems are reviewed and negative.  ? ? ?PHYSICAL EXAM: ?VS:  BP 136/82   Pulse 87   Ht 5' 5.5" (1.664 m)   Wt 187 lb 12.8 oz (85.2 kg)   SpO2 97%   BMI 30.78 kg/m?  , BMI Body mass index is 30.78 kg/m?. ? ? ?General: Alert, oriented x3, no distress, borderline obese ?Head: no evidence of trauma, PERRL, EOMI, no exophtalmos or lid lag, no myxedema, no xanthelasma; normal ears, nose and oropharynx ?Neck: normal jugular venous pulsations and no hepatojugular reflux; brisk carotid pulses without delay and no carotid bruits ?Chest: clear to auscultation, no signs of consolidation by percussion or palpation, normal fremitus, symmetrical and full respiratory excursions ?Cardiovascular: normal position and quality of the apical impulse, irregular rhythm, normal first and second heart sounds, 1/6 holosystolic murmur at the left lower sternal border, no diastolic murmurs, rubs or gallops ?Abdomen: no tenderness or distention, no masses by palpation,  no abnormal pulsatility or arterial bruits, normal bowel sounds, no hepatosplenomegaly ?Extremities: no clubbing, cyanosis or edema; 2+ radial, ulnar and brachial pulses bilaterally; 2+ right femoral, posterior tibial and

## 2022-02-18 NOTE — Progress Notes (Signed)
Patient ID: [name], female   DOB: 1935/12/18, 86 y.o.   MRN: [id number] ? ?  ?  ?Cardiology Office Note ? ? ?Date:  02/19/2022  ? ?ID:  [name], DOB 08-03-1936, MRN [id number] ? ?PCP:  Kathyrn Drown, MD  ?Cardiologist:   Sanda Klein, MD  ? ?Chief Complaint  ?Patient presents with  ? Shortness of Breath  ? Chest Pain  ? ?[name] is a 86 y.o. female who is being seen today for the evaluation of new onset atrial fibrillation at the request of Luking, Elayne Snare, MD. ? ?  ?History of Present Illness: ?[name] is a 86 y.o. female who presents for evaluation of exertional dyspnea and an episode of chest pain. ? ?I took care of her husband, WF, for many years.  He passed away last 2021-07-28.  They were married for 45 years.  She continues to live in the same home and is accompanied to the office today by her daughter. ? ?She complains of worsening shortness of breath over the last several months.  This started off as NYHA functional class II dyspnea, but now she will become short of breath making the bed.  She has to stop 3 times to cover the 150 steps to her mailbox.  She does not have shortness of breath at rest, orthopnea, PND or lower extremity edema. ? ?She had an episode of pneumonia in January (fever, persistent cough, fatigue) and was treated with doxycycline.  She was in normal sinus rhythm when she was seen in the emergency room on January 15.  She developed atrial fibrillation, detected at her follow-up visit with Dr. Wolfgang Phoenix on January 07, 2022.  He started her on anticoagulation with Eliquis, which she has been taking since then.  She was spontaneously rate controlled (on metoprolol succinate 25 mg once daily).  She was unaware of the palpitations. ? ?She has had 1 episode of severe chest pain in the last month.  This occurred while she was driving and lasted for about 8 or 9 minutes.  She has not had exertional chest pain and the resting chest pain has not recurred. ? ?Recently  she is also been diagnosed with lymphoproliferative disorder, possible CLL and has a follow-up visit with the [facility] on May 15. ? ?She has a long-standing history of systemic hypertension, although she only started taking antihypertensives medications at the age of 18. She has well treated hypothyroidism history of hiatal hernia and osteopenia but has otherwise been remarkably healthy.  She is borderline obese with a BMI just over 30. ? ?About 20 years ago she had a chest x-ray that described cardiomegaly but this was not investigated until 2016 when she had an echocardiogram.  This demonstrated that her cardiomegaly was due to mild biatrial dilation.  Her PA pressure was mildly elevated at 34 mmHg, left ventricular systolic function was normal and there were only very subtle signs of diastolic dysfunction.  She was asymptomatic at the time.  She had another echocardiogram in June 2022.  PA pressure was described as normal and the left atrium was described as moderately dilated.  LV EF remains normal at 60 to 65%. ? ?Finally she had an echocardiogram performed 02/10/2022.  Once again she has normal left ventricular systolic function with a EF of 55 to 60% and there is normal regional wall motion.  There is mild LVH.  Diastolic function could not be assessed due to atrial fibrillation.  Pulmonary pressure was estimated substantially higher at 46.7 mmHg.  Both atria were severely dilated.  Again, there were no significant valvular abnormalities, although now there is moderate TR. ? ? ?Past Medical History:  ?Diagnosis Date  ? Chronic Leukemia   ? Depression   ? H/O hiatal hernia   ? Hernia   ? Hernia   ? right inguinal hernia  ? Hypertension   ? Hypothyroidism   ? Leg swelling   ? Osteitis pubis ([place]) 05/30/2001  ? Osteopenia 03/30/2009  ? Thyroid disease   ? ? ?Past Surgical History:  ?Procedure Laterality Date  ? ABDOMINAL HYSTERECTOMY  03/1970  ? APPENDECTOMY  03/1966  ? BILATERAL  SALPINGOOPHORECTOMY    ? CATARACT EXTRACTION W/PHACO Right 09/25/2014  ? Procedure: CATARACT EXTRACTION PHACO AND INTRAOCULAR LENS PLACEMENT (IOC);  Surgeon: Elta Guadeloupe T. Gershon Crane, MD;  Location: [facility];  Service: Ophthalmology;  Laterality: Right;  CDE:7.56  ? CATARACT EXTRACTION W/PHACO Left 10/09/2014  ? Procedure: CATARACT EXTRACTION PHACO AND INTRAOCULAR LENS PLACEMENT LEFT EYE CDE=6.51;  Surgeon: Elta Guadeloupe T. Gershon Crane, MD;  Location: [facility];  Service: Ophthalmology;  Laterality: Left;  ? CHOLECYSTECTOMY  1980  ? COLONOSCOPY  1999  ? COLONOSCOPY  March 2007  ? Repeat 10 years  ? COLONOSCOPY  11/06/2003  ?  Internal and external hemorrhoids/Two tiny rectal distal sigmoid polyps cold biopsied/Otherwise within normal limits to the cecum  ? COLONOSCOPY N/A 08/29/2014  ? Dr. Gala Romney: Sessile polyp was found at the cecum status post cold snare polypectomy, tubular adenoma. No further surveillance unless clinical status changes  ? ESOPHAGOGASTRODUODENOSCOPY  2007  ? Dr. Gala Romney: normal esophagus, small tom oderate sized hiatal hernia, Lysbeth Galas lesions  ? SKIN CANCER EXCISION Left 11 1 2016  ? left arm squamous cancer  ? TUMOR REMOVAL  03/1968  ? ovarian  ? ? ? ?Current Outpatient Medications  ?Medication Sig Dispense Refill  ? albuterol (VENTOLIN HFA) 108 (90 Base) MCG/ACT inhaler Inhale 2 puffs into the lungs every 6 (six) hours as needed for wheezing or shortness of breath. 18 g 1  ? alendronate (FOSAMAX) 70 MG tablet TAKE 1 TABLET(70 MG) BY MOUTH EVERY 7 DAYS WITH A FULL GLASS OF WATER AND ON AN EMPTY STOMACH 4 tablet 5  ? ALPRAZolam (XANAX) 0.5 MG tablet 1 bid prn 60 tablet 2  ? apixaban (ELIQUIS) 5 MG TABS tablet Take 1 tablet (5 mg total) by mouth 2 (two) times daily. 60 tablet 11  ? Cholecalciferol (VITAMIN D3) 2000 UNITS TABS Take 2 tablets by mouth daily.     ? indapamide (LOZOL) 1.25 MG tablet TAKE 1 TABLET BY MOUTH EVERY MORNING 90 tablet 1  ? levothyroxine (SYNTHROID) 125 MCG tablet Take one tablet by mouth daily 90 tablet 1   ? metoprolol succinate (TOPROL-XL) 25 MG 24 hr tablet Take one tablet daily 90 tablet 1  ? Multiple Vitamin (MULTI-VITAMIN DAILY PO) Take by mouth.    ? pantoprazole (PROTONIX) 40 MG tablet TAKE 1 TABLET(40 MG) BY MOUTH DAILY 90 tablet 1  ? potassium chloride (KLOR-CON M) 10 MEQ tablet TAKE 1 TABLET(10 MEQ) BY MOUTH DAILY 90 tablet 1  ? sertraline (ZOLOFT) 50 MG tablet TAKE 1 TABLET(50 MG) BY MOUTH DAILY 90 tablet 1  ? ?No current facility-administered medications for this visit.  ? ? ?Allergies:   Erythromycin, Aspirin, and Ibuprofen  ? ? ?Social History:  The patient  reports that she has never smoked. She has never used smokeless tobacco. She reports current alcohol use. She  reports that she does not use drugs.  ? ?Family History:  The patient's family history includes Cancer in her father and sister; Dementia in her mother; Heart disease in her father.  ? ? ?ROS:  Please see the history of present illness.    ?Otherwise, review of systems positive for none.   ?All other systems are reviewed and negative.  ? ? ?PHYSICAL EXAM: ?VS:  BP 136/82   Pulse 87   Ht 5' 5.5" (1.664 m)   Wt 187 lb 12.8 oz (85.2 kg)   SpO2 97%   BMI 30.78 kg/m?  , BMI Body mass index is 30.78 kg/m?. ? ? ?General: Alert, oriented x3, no distress, borderline obese ?Head: no evidence of trauma, PERRL, EOMI, no exophtalmos or lid lag, no myxedema, no xanthelasma; normal ears, nose and oropharynx ?Neck: normal jugular venous pulsations and no hepatojugular reflux; brisk carotid pulses without delay and no carotid bruits ?Chest: clear to auscultation, no signs of consolidation by percussion or palpation, normal fremitus, symmetrical and full respiratory excursions ?Cardiovascular: normal position and quality of the apical impulse, irregular rhythm, normal first and second heart sounds, 1/6 holosystolic murmur at the left lower sternal border, no diastolic murmurs, rubs or gallops ?Abdomen: no tenderness or distention, no masses by palpation,  no abnormal pulsatility or arterial bruits, normal bowel sounds, no hepatosplenomegaly ?Extremities: no clubbing, cyanosis or edema; 2+ radial, ulnar and brachial pulses bilaterally; 2+ right femoral, posterior tibial and

## 2022-02-18 NOTE — Patient Instructions (Signed)
Medication Instructions:  ?No changes ?*If you need a refill on your cardiac medications before your next appointment, please call your pharmacy* ? ?Testing/Procedures: ?Your physician has requested that you have a lexiscan myoview. For further information please visit [email or web address]. Please follow instruction sheet, as given.  ?How to prepare for your Myocardial Perfusion Test: ?Do not eat or drink 3 hours prior to your test, except you may have water. ?Do not consume products containing caffeine (regular or decaffeinated) 12 hours prior to your test. (ex: coffee, chocolate, sodas, tea). ?Do bring a list of your current medications with you.  If not listed below, you may take your medications as normal. ?Do wear comfortable clothes (no dresses or overalls) and walking shoes, tennis shoes preferred (No heels or open toe shoes are allowed). ?Do NOT wear cologne, perfume, aftershave, or lotions (deodorant is allowed). ?The test will take approximately 3 to 4 hours to complete ?If these instructions are not followed, your test will have to be rescheduled. ? ?Your physician has recommended that you have a pulmonary function test. Pulmonary Function Tests are a group of tests that measure how well air moves in and out of your lungs. ? ? ?Follow-Up: ?At [facility], you and your health needs are our priority.  As part of our continuing mission to provide you with exceptional heart care, we have created designated Provider Care Teams.  These Care Teams include your primary Cardiologist (physician) and Advanced Practice Providers (APPs -  Physician Assistants and Nurse Practitioners) who all work together to provide you with the care you need, when you need it. ? ?We recommend signing up for the patient portal called "MyChart".  Sign up information is provided on this After Visit Summary.  MyChart is used to connect with patients for Virtual Visits (Telemedicine).  Patients are able to view lab/test results,  encounter notes, upcoming appointments, etc.  Non-urgent messages can be sent to your provider as well.   ?To learn more about what you can do with MyChart, go to [email or web address].   ? ?Your next appointment:   ?3 week(s) after the cardioversion on 03/03/22 ? ?The format for your next appointment:   ?In Person ? ?Provider:   ?Sanda Klein, MD { ? ? ?Other Instructions ? ?You are scheduled for a Cardioversion on 03/10/22 with Dr. Sallyanne Kuster.  Please arrive at the [facility] (Main Entrance A) at [facility]: [address] [place], [place] [address] at 8:30 am. (1 hour prior to procedure unless lab work is needed) ? ?DIET: Nothing to eat or drink after midnight except a sip of water with medications (see medication instructions below) ? ?FYI: For your safety, and to allow [place] to monitor your vital signs accurately during the surgery/procedure we request that   ?if you have artificial nails, gel coating, SNS etc. Please have those removed prior to your surgery/procedure. Not having the nail coverings /polish removed may result in cancellation or delay of your surgery/procedure. ? ? ?Medication Instructions: ?Hold: nothing to hold ? ?Continue your anticoagulant: Eliquis ?You will need to continue your anticoagulant after your procedure until you  are told by your provider that it is safe to stop ? ?Labs:  ?Your provider would like for you to return on 03/03/22 to have the following labs drawn: BNP, CBC and BMET. You do not need an appointment for the lab. Once in our office lobby there is a podium where you can sign in and ring the doorbell to alert [place] that you  are here. The lab is open from 8:00 am to 4 pm; closed for lunch from 12:45pm-1:45pm. ? ?You may also go to any of these [facility] locations: ?  ?[place] ?- [place] ([facility]) ?- [address] ?- [place]  ?[place] ?- [place] ?   ?You must have a responsible person to drive you home and stay in the waiting area during your procedure. Failure to do so could result in cancellation. ? ?Interior and spatial designer cards. ? ?*Special Note: Every effort is made to have your procedure done on time. Occasionally there are emergencies that occur at the hospital that may cause delays. Please be patient if a delay does occur.  ? ? ?

## 2022-02-19 MED ORDER — APIXABAN 5 MG PO TABS: 5.0000 mg | ORAL_TABLET | Freq: Two times a day (BID) | ORAL | 11 refills | Status: DC

## 2022-02-24 ENCOUNTER — Telehealth (HOSPITAL_COMMUNITY): Payer: Self-pay | Admitting: *Deleted

## 2022-02-24 NOTE — Telephone Encounter (Signed)
Close encounter 

## 2022-02-25 ENCOUNTER — Ambulatory Visit (HOSPITAL_COMMUNITY)
Admission: RE | Admit: 2022-02-25 | Discharge: 2022-02-25 | Disposition: A | Discharge status: Home / Self Care | Payer: PPO | Source: Ambulatory Visit | Attending: Cardiology | Admitting: Cardiology

## 2022-02-25 DIAGNOSIS — R079 Chest pain, unspecified: Secondary | ICD-10-CM

## 2022-02-25 LAB — MYOCARDIAL PERFUSION IMAGING
Peak HR: 98 {beats}/min
Rest HR: 86 {beats}/min
Rest Nuclear Isotope Dose: 10.4 mCi
SDS: 0
SRS: 0
SSS: 0
ST Depression (mm): 0 mm
Stress Nuclear Isotope Dose: 31.8 mCi
TID: 1.06

## 2022-02-25 MED ORDER — TECHNETIUM TC 99M TETROFOSMIN IV KIT
31.8000 | PACK | Freq: Once | INTRAVENOUS | Status: AC | PRN
  Administered 2022-02-25: 31.8 via INTRAVENOUS
  Filled 2022-02-25: qty 32

## 2022-02-25 MED ORDER — TECHNETIUM TC 99M TETROFOSMIN IV KIT
10.4000 | PACK | Freq: Once | INTRAVENOUS | Status: AC | PRN
  Administered 2022-02-25: 10.4 via INTRAVENOUS
  Filled 2022-02-25: qty 11

## 2022-02-25 MED ORDER — REGADENOSON 0.4 MG/5ML IV SOLN
0.4000 mg | Freq: Once | INTRAVENOUS | Status: AC
  Administered 2022-02-25: 0.4 mg via INTRAVENOUS

## 2022-02-26 ENCOUNTER — Ambulatory Visit (HOSPITAL_COMMUNITY)
Admission: RE | Admit: 2022-02-26 | Discharge: 2022-02-26 | Disposition: A | Discharge status: Home / Self Care | Payer: PPO | Source: Ambulatory Visit | Attending: Cardiovascular Disease | Admitting: Cardiovascular Disease

## 2022-02-26 DIAGNOSIS — I4819 Other persistent atrial fibrillation: Secondary | ICD-10-CM | POA: Insufficient documentation

## 2022-02-26 DIAGNOSIS — I5032 Chronic diastolic (congestive) heart failure: Secondary | ICD-10-CM | POA: Diagnosis not present

## 2022-02-26 LAB — PULMONARY FUNCTION TEST
DL/VA % pred: 102 %
DL/VA: 4.09 ml/min/mmHg/L
DLCO unc % pred: 57 %
DLCO unc: 11.18 ml/min/mmHg
FEF 25-75 Post: 2.07 L/sec
FEF 25-75 Pre: 1.86 L/sec
FEF2575-%Change-Post: 11 %
FEF2575-%Pred-Post: 160 %
FEF2575-%Pred-Pre: 144 %
FEV1-%Change-Post: 0 %
FEV1-%Pred-Post: 74 %
FEV1-%Pred-Pre: 74 %
FEV1-Post: 1.44 L
FEV1-Pre: 1.44 L
FEV1FVC-%Change-Post: 5 %
FEV1FVC-%Pred-Pre: 117 %
FEV6-%Change-Post: -5 %
FEV6-%Pred-Post: 64 %
FEV6-%Pred-Pre: 68 %
FEV6-Post: 1.6 L
FEV6-Pre: 1.69 L
FEV6FVC-%Pred-Post: 106 %
FEV6FVC-%Pred-Pre: 106 %
FVC-%Change-Post: -5 %
FVC-%Pred-Post: 60 %
FVC-%Pred-Pre: 64 %
FVC-Post: 1.6 L
FVC-Pre: 1.69 L
Post FEV1/FVC ratio: 90 %
Post FEV6/FVC ratio: 100 %
Pre FEV1/FVC ratio: 85 %
Pre FEV6/FVC Ratio: 100 %
RV % pred: 50 %
RV: 1.28 L
TLC % pred: 54 %
TLC: 2.89 L

## 2022-02-26 MED ORDER — ALBUTEROL SULFATE (2.5 MG/3ML) 0.083% IN NEBU
2.5000 mg | INHALATION_SOLUTION | Freq: Once | RESPIRATORY_TRACT | Status: AC
  Administered 2022-02-26: 2.5 mg via RESPIRATORY_TRACT

## 2022-03-02 ENCOUNTER — Encounter (HOSPITAL_COMMUNITY): Payer: Self-pay | Admitting: Cardiovascular Disease

## 2022-03-02 NOTE — Progress Notes (Signed)
Attempted to obtain medical history via telephone, unable to reach at this time. I left a voicemail to return pre surgical testing department's phone call.  

## 2022-03-03 DIAGNOSIS — I4891 Unspecified atrial fibrillation: Secondary | ICD-10-CM | POA: Diagnosis not present

## 2022-03-03 DIAGNOSIS — R0602 Shortness of breath: Secondary | ICD-10-CM | POA: Diagnosis not present

## 2022-03-04 ENCOUNTER — Encounter: Payer: Self-pay | Admitting: *Deleted

## 2022-03-04 ENCOUNTER — Other Ambulatory Visit: Payer: Self-pay | Admitting: *Deleted

## 2022-03-04 DIAGNOSIS — R942 Abnormal results of pulmonary function studies: Secondary | ICD-10-CM

## 2022-03-04 LAB — CBC
Hematocrit: 35.3 % (ref 34.0–46.6)
Hemoglobin: 11.6 g/dL (ref 11.1–15.9)
MCH: 28.5 pg (ref 26.6–33.0)
MCHC: 32.9 g/dL (ref 31.5–35.7)
MCV: 87 fL (ref 79–97)
Platelets: 389 10*3/uL (ref 150–450)
RBC: 4.07 x10E6/uL (ref 3.77–5.28)
RDW: 13.1 % (ref 11.7–15.4)
WBC: 9.7 10*3/uL (ref 3.4–10.8)

## 2022-03-04 LAB — BASIC METABOLIC PANEL
BUN/Creatinine Ratio: 19 (ref 12–28)
BUN: 16 mg/dL (ref 8–27)
CO2: 25 mmol/L (ref 20–29)
Calcium: 9 mg/dL (ref 8.7–10.3)
Chloride: 102 mmol/L (ref 96–106)
Creatinine, Ser: 0.85 mg/dL (ref 0.57–1.00)
Glucose: 107 mg/dL — ABNORMAL HIGH (ref 70–99)
Potassium: 4.5 mmol/L (ref 3.5–5.2)
Sodium: 143 mmol/L (ref 134–144)
eGFR: 68 mL/min/{1.73_m2} (ref >59)

## 2022-03-04 LAB — BRAIN NATRIURETIC PEPTIDE: BNP: 332.4 pg/mL — ABNORMAL HIGH (ref 0.0–100.0)

## 2022-03-09 ENCOUNTER — Other Ambulatory Visit: Payer: Self-pay | Admitting: *Deleted

## 2022-03-09 DIAGNOSIS — I4819 Other persistent atrial fibrillation: Secondary | ICD-10-CM

## 2022-03-10 ENCOUNTER — Ambulatory Visit (HOSPITAL_BASED_OUTPATIENT_CLINIC_OR_DEPARTMENT_OTHER): Payer: PPO | Admitting: General Practice

## 2022-03-10 ENCOUNTER — Ambulatory Visit (HOSPITAL_COMMUNITY)
Admission: RE | Admit: 2022-03-10 | Discharge: 2022-03-10 | Disposition: A | Discharge status: Home / Self Care | Payer: PPO | Source: Ambulatory Visit | Attending: Cardiovascular Disease | Admitting: Cardiovascular Disease

## 2022-03-10 ENCOUNTER — Other Ambulatory Visit: Payer: Self-pay

## 2022-03-10 ENCOUNTER — Ambulatory Visit (HOSPITAL_COMMUNITY): Payer: PPO | Admitting: General Practice

## 2022-03-10 ENCOUNTER — Ambulatory Visit: Payer: PPO | Admitting: Family Medicine

## 2022-03-10 ENCOUNTER — Encounter (HOSPITAL_COMMUNITY)
Admission: RE | Disposition: A | Discharge status: Home / Self Care | Payer: Self-pay | Source: Ambulatory Visit | Attending: Cardiovascular Disease

## 2022-03-10 ENCOUNTER — Encounter (HOSPITAL_COMMUNITY): Payer: Self-pay | Admitting: Cardiovascular Disease

## 2022-03-10 DIAGNOSIS — I081 Rheumatic disorders of both mitral and tricuspid valves: Secondary | ICD-10-CM | POA: Insufficient documentation

## 2022-03-10 DIAGNOSIS — I10 Essential (primary) hypertension: Secondary | ICD-10-CM

## 2022-03-10 DIAGNOSIS — M1990 Unspecified osteoarthritis, unspecified site: Secondary | ICD-10-CM | POA: Insufficient documentation

## 2022-03-10 DIAGNOSIS — I491 Atrial premature depolarization: Secondary | ICD-10-CM | POA: Diagnosis not present

## 2022-03-10 DIAGNOSIS — K219 Gastro-esophageal reflux disease without esophagitis: Secondary | ICD-10-CM | POA: Insufficient documentation

## 2022-03-10 DIAGNOSIS — Z79890 Hormone replacement therapy (postmenopausal): Secondary | ICD-10-CM | POA: Insufficient documentation

## 2022-03-10 DIAGNOSIS — I272 Pulmonary hypertension, unspecified: Secondary | ICD-10-CM

## 2022-03-10 DIAGNOSIS — I440 Atrioventricular block, first degree: Secondary | ICD-10-CM | POA: Diagnosis not present

## 2022-03-10 DIAGNOSIS — Z7901 Long term (current) use of anticoagulants: Secondary | ICD-10-CM | POA: Diagnosis not present

## 2022-03-10 DIAGNOSIS — Z79899 Other long term (current) drug therapy: Secondary | ICD-10-CM | POA: Insufficient documentation

## 2022-03-10 DIAGNOSIS — I4891 Unspecified atrial fibrillation: Secondary | ICD-10-CM | POA: Diagnosis not present

## 2022-03-10 DIAGNOSIS — I4819 Other persistent atrial fibrillation: Secondary | ICD-10-CM

## 2022-03-10 DIAGNOSIS — E039 Hypothyroidism, unspecified: Secondary | ICD-10-CM | POA: Insufficient documentation

## 2022-03-10 DIAGNOSIS — I340 Nonrheumatic mitral (valve) insufficiency: Secondary | ICD-10-CM | POA: Diagnosis not present

## 2022-03-10 DIAGNOSIS — F32A Depression, unspecified: Secondary | ICD-10-CM | POA: Insufficient documentation

## 2022-03-10 DIAGNOSIS — I700 Atherosclerosis of aorta: Secondary | ICD-10-CM | POA: Diagnosis not present

## 2022-03-10 HISTORY — PX: CARDIOVERSION: SHX1299

## 2022-03-10 SURGERY — CARDIOVERSION
Anesthesia: General

## 2022-03-10 MED ORDER — LIDOCAINE 2% (20 MG/ML) 5 ML SYRINGE
INTRAMUSCULAR | Status: DC | PRN
  Administered 2022-03-10: 80 mg via INTRAVENOUS

## 2022-03-10 MED ORDER — SODIUM CHLORIDE 0.9 % IV SOLN: INTRAVENOUS | Status: DC

## 2022-03-10 MED ORDER — SODIUM CHLORIDE 0.9 % IV SOLN
INTRAVENOUS | Status: AC | PRN
  Administered 2022-03-10: 500 mL via INTRAMUSCULAR

## 2022-03-10 MED ORDER — PROPOFOL 10 MG/ML IV BOLUS
INTRAVENOUS | Status: DC | PRN
Start: 2022-03-10 — End: 2022-03-10
  Administered 2022-03-10: 60 mg via INTRAVENOUS

## 2022-03-10 NOTE — Op Note (Signed)
Procedure: Electrical Cardioversion ?Indications:  Atrial Fibrillation ? ?Procedure Details: ? ?Consent: Risks of procedure as well as the alternatives and risks of each were explained to the (patient/caregiver).  Consent for procedure obtained. ? ?Time Out: Verified patient identification, verified procedure, site/side was marked, verified correct patient position, special equipment/implants available, medications/allergies/relevent history reviewed, required imaging and test results available.  Performed ? ?Patient placed on cardiac monitor, pulse oximetry, supplemental oxygen as necessary.  ?Sedation given:  propofol 60 mg IV, Dr. Lissa Hoard ?Pacer pads placed anterior and posterior chest. ? ?Cardioverted 2 time(s). Unsuccessful at 120J. ?Cardioversion with synchronized biphasic 200J shock. ? ?Evaluation: ?Findings: Post procedure EKG shows: NSR with 1st degree AV block and PACs ?Complications: None ?Patient did tolerate procedure well. ? ?Time Spent Directly with the Patient: ? ?30 minutes  ? ?[name] ?03/10/2022, 9:10 AM ? ? ? ? ?

## 2022-03-10 NOTE — Anesthesia Postprocedure Evaluation (Signed)
Anesthesia Post Note ? ?Patient: [name] ? ?Procedure(s) Performed: CARDIOVERSION ? ?  ? ?Patient location during evaluation: PACU ?Anesthesia Type: General ?Level of consciousness: sedated and patient cooperative ?Pain management: pain level controlled ?Vital Signs Assessment: post-procedure vital signs reviewed and stable ?Respiratory status: spontaneous breathing ?Cardiovascular status: stable ?Anesthetic complications: no ? ? ?No notable events documented. ? ?Last Vitals:  ?Vitals:  ? 03/10/22 0942 03/10/22 0948  ?BP: (!) 141/80 95/82  ?Pulse: 63 65  ?Resp: (!) 25 (!) 22  ?Temp:    ?SpO2: 98% 99%  ?  ?Last Pain:  ?Vitals:  ? 03/10/22 0942  ?TempSrc:   ?PainSc: 0-No pain  ? ? ?  ?  ?  ?  ?  ?  ? ?Nolon Nations ? ? ? ? ?

## 2022-03-10 NOTE — Anesthesia Procedure Notes (Signed)
Procedure Name: Quail ?Date/Time: 03/10/2022 9:02 AM ?Performed by: Dorann Lodge, CRNA ?Pre-anesthesia Checklist: Patient identified, Emergency Drugs available, Suction available and Patient being monitored ?Patient Re-evaluated:Patient Re-evaluated prior to induction ?Oxygen Delivery Method: Nasal cannula ?Induction Type: IV induction ? ? ? ? ?

## 2022-03-10 NOTE — Anesthesia Preprocedure Evaluation (Addendum)
Anesthesia Evaluation  ?Patient identified by MRN, date of birth, ID band ?Patient awake ? ? ? ?Reviewed: ?Allergy & Precautions, NPO status , Patient's Chart, lab work & pertinent test results ? ?Airway ?Mallampati: III ? ?TM Distance: >3 FB ?Neck ROM: Full ? ? ? Dental ? ?(+) Dental Advisory Given, Teeth Intact, Caps ?  ?Pulmonary ?pneumonia,  ?  ?+ rhonchi ? ? ? ? ? ? ? Cardiovascular ?hypertension, Pt. on medications and Pt. on home beta blockers ?pulmonary hypertension+ Valvular Problems/Murmurs MR  ?Rhythm:Irregular Rate:Normal ? ?Echo 01/2022 ??1. Left ventricular ejection fraction, by estimation, is 55 to 60%. The left ventricle has normal function. The left ventricle has no regional wall motion abnormalities. There is mild concentric left ventricular hypertrophy. Diastolic function indeterminant due to Afib.  ??2. Right ventricular systolic function is normal. The right ventricular size is normal. There is moderately elevated pulmonary artery systolic pressure. The estimated right ventricular systolic pressure is 74.2 mmHg.  ??3. Left atrial size was moderately-to-severely dilated.  ??4. Right atrial size was severely dilated.  ??5. The mitral valve is abnormal. Mild mitral valve regurgitation. Moderate mitral annular calcification.  ??6. Tricuspid valve regurgitation is moderate.  ??7. The aortic valve is tricuspid. There is mild calcification of the aortic valve. There is mild thickening of the aortic valve. Aortic valve regurgitation is trivial.  ??8. The inferior vena cava is dilated in size with >50% respiratory variability, suggesting right atrial pressure of 8 mmHg.  ?  ?Neuro/Psych ?PSYCHIATRIC DISORDERS Depression negative neurological ROS ?   ? GI/Hepatic ?Neg liver ROS, hiatal hernia, GERD  ,  ?Endo/Other  ?negative endocrine ROSHypothyroidism  ? Renal/GU ?negative Renal ROS  ? ?  ?Musculoskeletal ?negative musculoskeletal ROS ?(+) Arthritis ,  ? Abdominal ?(+) +  obese,   ?Peds ? Hematology ?negative hematology ROS ?(+)   ?Anesthesia Other Findings ? ? Reproductive/Obstetrics ? ?  ? ? ? ? ? ? ? ? ? ? ? ? ? ?  ?  ? ? ? ? ? ? ?Anesthesia Physical ?Anesthesia Plan ? ?ASA: 3 ? ?Anesthesia Plan: General  ? ?Post-op Pain Management: Minimal or no pain anticipated  ? ?Induction: Intravenous ? ?PONV Risk Score and Plan: 3 and Propofol infusion, Treatment may vary due to age or medical condition and TIVA ? ?Airway Management Planned: Mask ? ?Additional Equipment:  ? ?Intra-op Plan:  ? ?Post-operative Plan:  ? ?Informed Consent: I have reviewed the patients History and Physical, chart, labs and discussed the procedure including the risks, benefits and alternatives for the proposed anesthesia with the patient or authorized representative who has indicated his/her understanding and acceptance.  ? ? ? ?Dental advisory given ? ?Plan Discussed with: CRNA ? ?Anesthesia Plan Comments:   ? ? ? ? ? ?Anesthesia Quick Evaluation ? ?

## 2022-03-10 NOTE — Transfer of Care (Signed)
Immediate Anesthesia Transfer of Care Note ? ?Patient: [name] ? ?Procedure(s) Performed: CARDIOVERSION ? ?Patient Location: Endoscopy Unit ? ?Anesthesia Type:MAC ? ?Level of Consciousness: awake and drowsy ? ?Airway & Oxygen Therapy: Patient Spontanous Breathing and Patient connected to nasal cannula oxygen ? ?Post-op Assessment: Report given to RN and Post -op Vital signs reviewed and stable ? ?Post vital signs: Reviewed and stable ? ?Last Vitals:  ?Vitals Value Taken Time  ?BP 122/66   ?Temp    ?Pulse 75   ?Resp 18   ?SpO2 96   ? ? ?Last Pain:  ?Vitals:  ? 03/10/22 0826  ?TempSrc: Temporal  ?PainSc: 0-No pain  ?   ? ?  ? ?Complications: No notable events documented. ?

## 2022-03-10 NOTE — Discharge Instructions (Signed)

## 2022-03-10 NOTE — Interval H&P Note (Signed)
History and Physical Interval Note: ? ?03/10/2022 ?9:29 AM ? ?[name]  has presented today for surgery, with the diagnosis of AFIB.  The various methods of treatment have been discussed with the patient and family. After consideration of risks, benefits and other options for treatment, the patient has consented to  Procedure(s): ?CARDIOVERSION (N/A) as a surgical intervention.  The patient's history has been reviewed, patient examined, no change in status, stable for surgery.  I have reviewed the patient's chart and labs.  Questions were answered to the patient's satisfaction.   ? ? ?[name] ? ? ?

## 2022-03-11 ENCOUNTER — Encounter (HOSPITAL_COMMUNITY): Payer: Self-pay | Admitting: Cardiovascular Disease

## 2022-03-24 ENCOUNTER — Encounter: Payer: Self-pay | Admitting: Family Medicine

## 2022-03-24 ENCOUNTER — Ambulatory Visit (INDEPENDENT_AMBULATORY_CARE_PROVIDER_SITE_OTHER): Payer: PPO | Admitting: Family Medicine

## 2022-03-24 VITALS — BP 130/80 | HR 91 | Temp 98.6°F | Wt 188.2 lb

## 2022-03-24 DIAGNOSIS — R0609 Other forms of dyspnea: Secondary | ICD-10-CM | POA: Diagnosis not present

## 2022-03-24 DIAGNOSIS — I4891 Unspecified atrial fibrillation: Secondary | ICD-10-CM | POA: Diagnosis not present

## 2022-03-24 DIAGNOSIS — R519 Headache, unspecified: Secondary | ICD-10-CM

## 2022-03-24 NOTE — Progress Notes (Signed)
03/24/22-headache log printed out. Note written to patient and headache log mailed to patient.  ?

## 2022-03-24 NOTE — Patient Instructions (Signed)

## 2022-03-24 NOTE — Progress Notes (Signed)
? ?  Subjective:  ? ? Patient ID: [name], female    DOB: 01-Jan-1936, 86 y.o.   MRN: [id number] ? ?HPI ?Pt arrives to follow up on blood pressure. Pt has seen cardiology for pneumonia that she had in January but they are referring her out to a lung doctor. Pt states they shocked her heart but her energy level has decreased. Pt states she has not checked her blood pressure. ? ? Still having headaches on right side on head. Taking tylenol and heating pad.  She has had this off and on going on for years.  She states it started off with some sore areas in the back of her scalp now more in the right side of her head ? ? ?Review of Systems ? ?   ?Objective:  ? Physical Exam ?General-in no acute distress ?Eyes-no discharge ?Lungs-respiratory rate normal, CTA ?CV-no murmurs,irreg but rate controlled ?Extremities skin warm dry no edema ?Neuro grossly normal ?Behavior normal, alert ? ?Neurologic grossly normal and there is no focal findings ? ?EKG does appear that she is back in atrial fibs.  She will be seeing cardiology coming up within the next 2 to 12 days ?   ?Assessment & Plan:  ?1. Atrial fibrillation, unspecified type ([place]) ?Atrial fibrillation follow-up with cardiology not sure if they will try to convert her again or just leave it as is.  Continue Eliquis. ?- EKG 12-Lead ? ?2. DOE (dyspnea on exertion) ?CT scan reviewed with patient as well as pulmonary function she sees pulmonology next week.  We did check O2 sat on room air is 96% after walking 2 to 3 minutes her O2 saturation was still at 94-96% ?- EKG 12-Lead ? ?3. Frequent headaches ?She will do a headache calendar.  She will send it to [place] within 30 days.  May have to do CT scans/MRI to make sure there is not underlying issue more than likely I think this is tension headaches she describes it as scalp tenderness. ? ? ?

## 2022-04-01 ENCOUNTER — Encounter: Payer: Self-pay | Admitting: Internal Medicine

## 2022-04-01 ENCOUNTER — Other Ambulatory Visit: Payer: Self-pay | Admitting: Family Medicine

## 2022-04-01 ENCOUNTER — Ambulatory Visit: Payer: PPO | Admitting: Internal Medicine

## 2022-04-01 VITALS — BP 120/80 | HR 81 | Temp 98.1°F | Ht 65.5 in | Wt 191.8 lb

## 2022-04-01 DIAGNOSIS — J849 Interstitial pulmonary disease, unspecified: Secondary | ICD-10-CM

## 2022-04-01 DIAGNOSIS — J84112 Idiopathic pulmonary fibrosis: Secondary | ICD-10-CM | POA: Diagnosis not present

## 2022-04-01 NOTE — Progress Notes (Signed)
? ?      ?[name]    [id number]    [birth year] ? ?Primary Care Physician:Kane, Sheena Snare, MD ? ?Referring Physician: Sanda Klein, MD ?[address] ?[address] ?[place] [address] ?Reason for Consultation: shortness of breath ?Date of Consultation: 04/01/2022 ? ?Chief complaint:   ?Chief Complaint  ?Patient presents with  ? Consult  ?  PNA in January and then went into A-fib and had to be cardioverted.  She was told her problem is with her lungs now.  Sob with exertion since having pna.  ?  ? ?HPI: ?[name] is a 86 y.o. woman who presents for new patient evaluation of shortness of breath.  ? ?Had pneumonia treated in January 2023 with abx from urgent care.  ? ?Additionally recent history of atrial fibrillation new diagnosis s/p cardioversion, now on metoprolol and eliquis. She is asymptomatic from her A. Fib.  ? ?She had PFTs in March 2023 which showed mild restriction  with reduced diffusion capacity.  ? ?Dyspnea started in January after her pneumonia. Her shortness of breath is with exertion. She feels that as long as she goes slow she can do her daily activities. Turning over in bed makes her short of breath.  ? ?No previous history of pneumonia or bronchitis. No childhood respiratory disease.  ? ?She does have an albuterol inhaler which she takes twice a day. She does feel it helps her breathing.  ? ?She has a dry cough with white phlegm. She denies wheezing or chest tighness.  ? ?She has burning across her chest occasionally which doesn't seem associated with food. It comes just at rest sometimes and resolves spontaneously.  ? ?Husband passed away from kidney cancer in 2022. They were married 49 years. Her grandson also passed away unexpectedly this past year.  ? ?Social history: ? ?Occupation: retired, worked at [employer] job.  ?Exposures: lives at home independently and with dog.  ?Smoking history: passive smoke exposure in spouse. She is a never smoker.  ? ?Social History   ? ?Occupational History  ? Occupation: Retired  ?  Employer: RETIRED  ?  Comment: [name], retired in 1993  ?Tobacco Use  ? Smoking status: Never  ? Smokeless tobacco: Never  ? Tobacco comments:  ?  Never smoked  ?Substance and Sexual Activity  ? Alcohol use: Yes  ?  Alcohol/week: 0.0 standard drinks  ?  Comment: occasional margarita  ? Drug use: No  ? Sexual activity: Yes  ?  Birth control/protection: Surgical  ? ? ?Relevant family history: ? ?Family History  ?Problem Relation Age of Onset  ? Dementia Mother   ? Heart disease Father   ? Cancer Father   ?     throat  ? Cancer Sister   ?     breast  ? Colon cancer Neg Hx   ? Lung disease Neg Hx   ? Autoimmune disease Neg Hx   ? ? ?Past Medical History:  ?Diagnosis Date  ? Chronic Leukemia   ? Depression   ? H/O hiatal hernia   ? Hernia   ? Hernia   ? right inguinal hernia  ? Hypertension   ? Hypothyroidism   ? Leg swelling   ? Osteitis pubis ([place]) 05/30/2001  ? Osteopenia 03/30/2009  ? Thyroid disease   ? ? ?Past Surgical History:  ?Procedure Laterality Date  ? ABDOMINAL HYSTERECTOMY  03/1970  ? APPENDECTOMY  03/1966  ? BILATERAL SALPINGOOPHORECTOMY    ?  CARDIOVERSION N/A 03/10/2022  ? Procedure: CARDIOVERSION;  Surgeon: Sanda Klein, MD;  Location: [facility];  Service: Cardiovascular;  Laterality: N/A;  ? CATARACT EXTRACTION W/PHACO Right 09/25/2014  ? Procedure: CATARACT EXTRACTION PHACO AND INTRAOCULAR LENS PLACEMENT (IOC);  Surgeon: Elta Guadeloupe T. Gershon Crane, MD;  Location: [facility];  Service: Ophthalmology;  Laterality: Right;  CDE:7.56  ? CATARACT EXTRACTION W/PHACO Left 10/09/2014  ? Procedure: CATARACT EXTRACTION PHACO AND INTRAOCULAR LENS PLACEMENT LEFT EYE CDE=6.51;  Surgeon: Elta Guadeloupe T. Gershon Crane, MD;  Location: [facility];  Service: Ophthalmology;  Laterality: Left;  ? CHOLECYSTECTOMY  1980  ? COLONOSCOPY  1999  ? COLONOSCOPY  March 2007  ? Repeat 10 years  ? COLONOSCOPY  11/06/2003  ?  Internal and external hemorrhoids/Two tiny rectal distal sigmoid polyps cold  biopsied/Otherwise within normal limits to the cecum  ? COLONOSCOPY N/A 08/29/2014  ? Dr. Gala Romney: Sessile polyp was found at the cecum status post cold snare polypectomy, tubular adenoma. No further surveillance unless clinical status changes  ? ESOPHAGOGASTRODUODENOSCOPY  2007  ? Dr. Gala Romney: normal esophagus, small tom oderate sized hiatal hernia, Lysbeth Galas lesions  ? SKIN CANCER EXCISION Left 11 1 2016  ? left arm squamous cancer  ? TUMOR REMOVAL  03/1968  ? ovarian  ? ? ? ?Physical Exam: ?Blood pressure 120/80, pulse 81, temperature 98.1 ?F (36.7 ?C), temperature source Oral, height 5' 5.5" (1.664 m), weight 191 lb 12.8 oz (87 kg), SpO2 97 %. ?Gen:      No acute distress ?ENT:  no nasal polyps, mucus membranes moist ?Lungs:    No increased respiratory effort, symmetric chest wall excursion, clear to auscultation bilaterally, no wheezes or crackles ?CV:         irregularly irregualr, trace pedal edema ?Abd:      + bowel sounds; soft, non-tender; no distension ?MSK: no acute synovitis of DIP or PIP joints, no mechanics hands.  ?Skin:      Warm and dry; no rashes ?Neuro: normal speech, no focal facial asymmetry ?Psych: alert and oriented x3, normal mood and affect ? ? ?Data Reviewed/Medical Decision Making: ? ?Independent interpretation of tests: ?Imaging: ? Review of patient's CT Chest feb 2023 images revealed peripheral/subpleural reticulation suggesting early pulmonary fibrosis. The patient's images have been independently reviewed by me.   ? ?PFTs: ?I have personally reviewed the patient's PFTs and mild restriction to ventialtion with reduced diffusion capacity. ? ?  Latest Ref Rng & Units 02/26/2022  ? 12:39 PM  ?PFT Results  ?FVC-Pre L 1.69    ?FVC-Predicted Pre % 64    ?FVC-Post L 1.60    ?FVC-Predicted Post % 60    ?Pre FEV1/FVC % % 85    ?Post FEV1/FCV % % 90    ?FEV1-Pre L 1.44    ?FEV1-Predicted Pre % 74    ?FEV1-Post L 1.44    ?DLCO uncorrected ml/min/mmHg 11.18    ?DLCO UNC% % 57    ?DLVA Predicted % 102     ?TLC L 2.89    ?TLC % Predicted % 54    ?RV % Predicted % 50    ? ? ?Labs:  ?Lab Results  ?Component Value Date  ? WBC 9.7 03/03/2022  ? HGB 11.6 03/03/2022  ? HCT 35.3 03/03/2022  ? MCV 87 03/03/2022  ? PLT 389 03/03/2022  ? ?Lab Results  ?Component Value Date  ? NA 143 03/03/2022  ? K 4.5 03/03/2022  ? CL 102 03/03/2022  ? CO2 25 03/03/2022  ? ? ? ?Immunization status:  ?  Immunization History  ?Administered Date(s) Administered  ? Fluad Quad(high Dose 65+) 09/24/2020  ? Influenza, High Dose Seasonal PF 08/30/2021  ? Influenza,inj,Quad PF,6+ Mos 09/06/2018  ? Influenza-Unspecified 08/31/2015, 08/06/2016, 09/16/2017, 10/19/2019  ? PFIZER(Purple Top)SARS-COV-2 Vaccination 12/04/2019, 12/26/2019  ? Pneumococcal Conjugate-13 06/19/2014  ? Pneumococcal Polysaccharide-23 10/05/2017  ? Zoster, Live 10/05/2012  ? ? ? I reviewed prior external note(s) from cardiology, pcp ? I reviewed the result(s) of the labs and imaging as noted above.  ? I have ordered  ? ? ? ?Assessment:  ?ILD, new diagnosis ?Atrial fibrillation, rate controlled on eliquis ? ?Plan/Recommendations: ?Clinical symptoms and radiographic findings with PFTs consistent with ILD. Based on CT appearance possible UIP with early fine fibrosis. Will have her complete an ILD packet. Will present in ILD conference once i've received this. If albuterol prn is helpful ok to continue.  ? ?Will discuss anti-fibrotic therapy at next visit ofev vs esbriet.  ? ?Fortunately at this point she is minimally symptomatic, without crackles or hypoxemia.  ? ?We discussed disease management and progression at length today.  ? ?I spent 45 minutes in the care of this patient today including pre-charting, chart review, review of results, face-to-face care, coordination of care and communication with consultants etc.). ? ? ?Return to Care: ?Return in about 3 months (around 07/02/2022). ? ?Lenice Llamas, MD ?Pulmonary and Critical Care Medicine ?[place] ?Office:[phone] ? ?CC: [name], Mihai, MD ? ? ? ?

## 2022-04-01 NOTE — Patient Instructions (Signed)
Please schedule follow up scheduled with myself in 3 months.  If my schedule is not open yet, we will contact you with a reminder closer to that time. Please call [phone] if you haven't heard from [place] a month before.  ? ?Please fill out the ILD packet and either mail it back to me or drop it off at the office before your next appointment.  ? ?The changes in your lungs are concerning for Interstitial Lung Disease which is the term for scarring in the lungs. The type I think you have is called Idiopathic Pulmonary Fibrosis or "IPF". ? ?I will present your case in our monthly conference.  ? ?When you follow up we can talk about treatment options.  ? ?This is slowly progressive and has likely been there for a while.  ? ?You can keep taking albuterol inhaler as needed if it helps you.  ? ? ?

## 2022-04-02 ENCOUNTER — Ambulatory Visit: Payer: PPO | Admitting: Cardiovascular Disease

## 2022-04-02 ENCOUNTER — Encounter: Payer: Self-pay | Admitting: Cardiovascular Disease

## 2022-04-02 VITALS — BP 144/77 | HR 81 | Ht 65.5 in | Wt 192.0 lb

## 2022-04-02 DIAGNOSIS — E039 Hypothyroidism, unspecified: Secondary | ICD-10-CM

## 2022-04-02 DIAGNOSIS — I5032 Chronic diastolic (congestive) heart failure: Secondary | ICD-10-CM | POA: Diagnosis not present

## 2022-04-02 DIAGNOSIS — D6869 Other thrombophilia: Secondary | ICD-10-CM | POA: Diagnosis not present

## 2022-04-02 DIAGNOSIS — I10 Essential (primary) hypertension: Secondary | ICD-10-CM | POA: Diagnosis not present

## 2022-04-02 DIAGNOSIS — D479 Neoplasm of uncertain behavior of lymphoid, hematopoietic and related tissue, unspecified: Secondary | ICD-10-CM | POA: Diagnosis not present

## 2022-04-02 DIAGNOSIS — I4819 Other persistent atrial fibrillation: Secondary | ICD-10-CM

## 2022-04-02 DIAGNOSIS — R079 Chest pain, unspecified: Secondary | ICD-10-CM | POA: Diagnosis not present

## 2022-04-02 NOTE — Patient Instructions (Signed)

## 2022-04-02 NOTE — Progress Notes (Signed)
Patient ID: KACY HEGNA, female   DOB: May 01, 1936, 86 y.o.   MRN: [id number] ? ?  ?  ?Cardiology Office Note ? ? ?Date:  04/05/2022  ? ?ID:  ASYAH CANDLER, DOB May 10, 1936, MRN [id number] ? ?PCP:  Kathyrn Drown, MD  ?Cardiologist:   Sanda Klein, MD  ? ?Chief Complaint  ?Patient presents with  ? Atrial Fibrillation  ? ? ?  ?History of Present Illness: ?AVARI NEVARES is a 86 y.o. female who presents for follow-up of atrial fibrillation. ? ?I took care of her husband, WF, for many years.  He passed away last 08/06/2021.  They were married for 70 years.  She continues to live in the same home and is accompanied to the office today by her daughter. ? ?She underwent elective cardioversion of atrial fibrillation on 03/10/2022, but had early recurrence of arrhythmia.  She was never aware of palpitations.  She really not clear that the atrial fibrillation is symptomatic.  She is on anticoagulation with Eliquis and has not had any bleeding problems.  She is well rate controlled on a low-dose of beta-blocker. ? ?Chest CT shows bilateral patchy interstitial lung disease.  Pulmonary function tests show significant restrictive abnormality with FVC 64% of predicted.  She is seeing Dr. Shearon Stalls in the pulmonary clinic, with plans to start treatment with either Esbriet or Ofev. ? ?Continues to have NYHA functional class II-III exertional dyspnea.  Denies chest pain, orthopnea, PND and lower extremity edema. ? ?She had an episode of pneumonia in January (fever, persistent cough, fatigue) and was treated with doxycycline.  She was in normal sinus rhythm when she was seen in the emergency room on January 15.  She developed atrial fibrillation, detected at her follow-up visit with Dr. Wolfgang Phoenix on January 07, 2022.  He started her on anticoagulation with Eliquis, which she has been taking since then.  She was spontaneously rate controlled (on metoprolol succinate 25 mg once daily).  She was unaware of the palpitations. ? ?She has had 1  episode of severe chest pain in the last month.  This occurred while she was driving and lasted for about 8 or 9 minutes.  She has not had exertional chest pain and the resting chest pain has not recurred. ? ?Recently she has also been diagnosed with lymphoproliferative disorder, possible CLL and has a follow-up visit with Dr. Irene Limbo on June 26. ? ?She has a long-standing history of systemic hypertension, although she only started taking antihypertensives medications at the age of 26. She has well treated hypothyroidism history of hiatal hernia and osteopenia but has otherwise been remarkably healthy.  She is borderline obese with a BMI just over 30. ? ?About 20 years ago she had a chest x-ray that described cardiomegaly but this was not investigated until 2016 when she had an echocardiogram.  This demonstrated that her cardiomegaly was due to mild biatrial dilation.  Her PA pressure was mildly elevated at 34 mmHg, left ventricular systolic function was normal and there were only very subtle signs of diastolic dysfunction.  She was asymptomatic at the time.  She had another echocardiogram in June 2022.  PA pressure was described as normal and the left atrium was described as moderately dilated.  LV EF remains normal at 60 to 65%. ? ?Finally she had an echocardiogram performed 02/10/2022.  Once again she has normal left ventricular systolic function with a EF of 55 to 60% and there is normal regional wall motion.  There is mild  LVH.  Diastolic function could not be assessed due to atrial fibrillation.  Pulmonary pressure was estimated substantially higher at 46.7 mmHg.  Both atria were severely dilated.  Again, there were no significant valvular abnormalities, although now there is moderate TR. ? ? ?Past Medical History:  ?Diagnosis Date  ? Chronic Leukemia   ? Depression   ? H/O hiatal hernia   ? Hernia   ? Hernia   ? right inguinal hernia  ? Hypertension   ? Hypothyroidism   ? Leg swelling   ? Osteitis pubis ([place])  05/30/2001  ? Osteopenia 03/30/2009  ? Thyroid disease   ? ? ?Past Surgical History:  ?Procedure Laterality Date  ? ABDOMINAL HYSTERECTOMY  03/1970  ? APPENDECTOMY  03/1966  ? BILATERAL SALPINGOOPHORECTOMY    ? CARDIOVERSION N/A 03/10/2022  ? Procedure: CARDIOVERSION;  Surgeon: Sanda Klein, MD;  Location: [facility];  Service: Cardiovascular;  Laterality: N/A;  ? CATARACT EXTRACTION W/PHACO Right 09/25/2014  ? Procedure: CATARACT EXTRACTION PHACO AND INTRAOCULAR LENS PLACEMENT (IOC);  Surgeon: Elta Guadeloupe T. Gershon Crane, MD;  Location: [facility];  Service: Ophthalmology;  Laterality: Right;  CDE:7.56  ? CATARACT EXTRACTION W/PHACO Left 10/09/2014  ? Procedure: CATARACT EXTRACTION PHACO AND INTRAOCULAR LENS PLACEMENT LEFT EYE CDE=6.51;  Surgeon: Elta Guadeloupe T. Gershon Crane, MD;  Location: [facility];  Service: Ophthalmology;  Laterality: Left;  ? CHOLECYSTECTOMY  1980  ? COLONOSCOPY  1999  ? COLONOSCOPY  March 2007  ? Repeat 10 years  ? COLONOSCOPY  11/06/2003  ?  Internal and external hemorrhoids/Two tiny rectal distal sigmoid polyps cold biopsied/Otherwise within normal limits to the cecum  ? COLONOSCOPY N/A 08/29/2014  ? Dr. Gala Romney: Sessile polyp was found at the cecum status post cold snare polypectomy, tubular adenoma. No further surveillance unless clinical status changes  ? ESOPHAGOGASTRODUODENOSCOPY  2007  ? Dr. Gala Romney: normal esophagus, small tom oderate sized hiatal hernia, Lysbeth Galas lesions  ? SKIN CANCER EXCISION Left 11 1 2016  ? left arm squamous cancer  ? TUMOR REMOVAL  03/1968  ? ovarian  ? ? ? ?Current Outpatient Medications  ?Medication Sig Dispense Refill  ? acetaminophen (TYLENOL) 650 MG CR tablet Take 650 mg by mouth every 8 (eight) hours as needed for pain.    ? albuterol (VENTOLIN HFA) 108 (90 Base) MCG/ACT inhaler INHALE 2 PUFFS INTO THE LUNGS EVERY 6 HOURS AS NEEDED FOR WHEEZING OR SHORTNESS OF BREATH 18 g 1  ? alendronate (FOSAMAX) 70 MG tablet TAKE 1 TABLET(70 MG) BY MOUTH EVERY 7 DAYS WITH A FULL GLASS OF WATER AND ON AN  EMPTY STOMACH 4 tablet 5  ? ALPRAZolam (XANAX) 0.5 MG tablet 1 bid prn (Patient taking differently: Take 0.25-0.5 mg by mouth at bedtime.) 60 tablet 2  ? apixaban (ELIQUIS) 5 MG TABS tablet Take 1 tablet (5 mg total) by mouth 2 (two) times daily. 60 tablet 11  ? Cholecalciferol (VITAMIN D3) 2000 UNITS TABS Take 4,000 Units by mouth daily.    ? indapamide (LOZOL) 1.25 MG tablet TAKE 1 TABLET BY MOUTH EVERY MORNING 90 tablet 1  ? levothyroxine (SYNTHROID) 125 MCG tablet Take one tablet by mouth daily 90 tablet 1  ? metoprolol succinate (TOPROL-XL) 25 MG 24 hr tablet Take one tablet daily 90 tablet 1  ? Multiple Vitamin (MULTI-VITAMIN DAILY PO) Take 1 tablet by mouth daily.    ? pantoprazole (PROTONIX) 40 MG tablet TAKE 1 TABLET(40 MG) BY MOUTH DAILY 90 tablet 1  ? potassium chloride (KLOR-CON M) 10 MEQ tablet TAKE 1  TABLET(10 MEQ) BY MOUTH DAILY 90 tablet 1  ? sertraline (ZOLOFT) 50 MG tablet TAKE 1 TABLET(50 MG) BY MOUTH DAILY 90 tablet 1  ? ?No current facility-administered medications for this visit.  ? ? ?Allergies:   Erythromycin, Aspirin, and Ibuprofen  ? ? ?Social History:  The patient  reports that she has never smoked. She has never used smokeless tobacco. She reports current alcohol use. She reports that she does not use drugs.  ? ?Family History:  The patient's family history includes Cancer in her father and sister; Dementia in her mother; Heart disease in her father.  ? ? ?ROS:  Please see the history of present illness.    ?Otherwise, review of systems positive for none.   ?All other systems are reviewed and negative.  ? ? ?PHYSICAL EXAM: ?VS:  BP (!) 144/77   Pulse 81   Ht 5' 5.5" (1.664 m)   Wt 192 lb (87.1 kg)   SpO2 96%   BMI 31.46 kg/m?  , BMI Body mass index is 31.46 kg/m?. ? ? ? ?General: Alert, oriented x3, no distress, mildly obese ?Head: no evidence of trauma, PERRL, EOMI, no exophtalmos or lid lag, no myxedema, no xanthelasma; normal ears, nose and oropharynx ?Neck: normal jugular venous  pulsations and no hepatojugular reflux; brisk carotid pulses without delay and no carotid bruits ?Chest: clear to auscultation, no signs of consolidation by percussion or palpation, normal fremitus, symmetr

## 2022-04-05 ENCOUNTER — Encounter: Payer: Self-pay | Admitting: Cardiovascular Disease

## 2022-04-06 ENCOUNTER — Other Ambulatory Visit (HOSPITAL_COMMUNITY): Payer: Self-pay | Admitting: Family Medicine

## 2022-04-06 ENCOUNTER — Other Ambulatory Visit (HOSPITAL_COMMUNITY): Payer: PPO

## 2022-04-06 DIAGNOSIS — Z1231 Encounter for screening mammogram for malignant neoplasm of breast: Secondary | ICD-10-CM

## 2022-04-07 DIAGNOSIS — D479 Neoplasm of uncertain behavior of lymphoid, hematopoietic and related tissue, unspecified: Secondary | ICD-10-CM | POA: Diagnosis not present

## 2022-04-07 DIAGNOSIS — I5032 Chronic diastolic (congestive) heart failure: Secondary | ICD-10-CM | POA: Diagnosis not present

## 2022-04-10 LAB — MULTIPLE MYELOMA PANEL, SERUM
Albumin SerPl Elph-Mcnc: 3.6 g/dL (ref 2.9–4.4)
Albumin/Glob SerPl: 1.2 (ref 0.7–1.7)
Alpha 1: 0.2 g/dL (ref 0.0–0.4)
Alpha2 Glob SerPl Elph-Mcnc: 0.9 g/dL (ref 0.4–1.0)
B-Globulin SerPl Elph-Mcnc: 0.9 g/dL (ref 0.7–1.3)
Gamma Glob SerPl Elph-Mcnc: 1.1 g/dL (ref 0.4–1.8)
Globulin, Total: 3.1 g/dL (ref 2.2–3.9)
IgA/Immunoglobulin A, Serum: 178 mg/dL (ref 64–422)
IgG (Immunoglobin G), Serum: 1237 mg/dL (ref 586–1602)
IgM (Immunoglobulin M), Srm: 50 mg/dL (ref 26–217)
Total Protein: 6.7 g/dL (ref 6.0–8.5)

## 2022-04-13 ENCOUNTER — Encounter: Payer: Self-pay | Admitting: *Deleted

## 2022-04-13 ENCOUNTER — Ambulatory Visit (HOSPITAL_COMMUNITY): Payer: PPO | Admitting: Hematology

## 2022-04-21 ENCOUNTER — Other Ambulatory Visit: Payer: Self-pay | Admitting: Family Medicine

## 2022-04-30 ENCOUNTER — Telehealth: Payer: Self-pay | Admitting: Hematology

## 2022-04-30 ENCOUNTER — Ambulatory Visit (HOSPITAL_COMMUNITY)
Admission: RE | Admit: 2022-04-30 | Discharge: 2022-04-30 | Disposition: A | Discharge status: Home / Self Care | Payer: PPO | Source: Ambulatory Visit | Attending: Family Medicine | Admitting: Family Medicine

## 2022-04-30 DIAGNOSIS — Z1231 Encounter for screening mammogram for malignant neoplasm of breast: Secondary | ICD-10-CM | POA: Diagnosis not present

## 2022-04-30 NOTE — Telephone Encounter (Signed)
Rescheduled upcoming appointment due to provider's template. Patient is aware of changes. 

## 2022-05-13 ENCOUNTER — Ambulatory Visit (HOSPITAL_COMMUNITY)
Admission: RE | Admit: 2022-05-13 | Discharge: 2022-05-13 | Disposition: A | Discharge status: Home / Self Care | Payer: PPO | Source: Ambulatory Visit | Attending: Hematology | Admitting: Hematology

## 2022-05-13 DIAGNOSIS — N281 Cyst of kidney, acquired: Secondary | ICD-10-CM | POA: Diagnosis not present

## 2022-05-13 DIAGNOSIS — K449 Diaphragmatic hernia without obstruction or gangrene: Secondary | ICD-10-CM | POA: Diagnosis not present

## 2022-05-13 DIAGNOSIS — J984 Other disorders of lung: Secondary | ICD-10-CM | POA: Diagnosis not present

## 2022-05-13 DIAGNOSIS — D479 Neoplasm of uncertain behavior of lymphoid, hematopoietic and related tissue, unspecified: Secondary | ICD-10-CM | POA: Insufficient documentation

## 2022-05-13 LAB — GLUCOSE, CAPILLARY: Glucose-Capillary: 111 mg/dL — ABNORMAL HIGH (ref 70–99)

## 2022-05-13 MED ORDER — FLUDEOXYGLUCOSE F - 18 (FDG) INJECTION
9.5000 | Freq: Once | INTRAVENOUS | Status: AC
  Administered 2022-05-13: 9.3 via INTRAVENOUS

## 2022-05-17 ENCOUNTER — Other Ambulatory Visit: Payer: Self-pay | Admitting: Family Medicine

## 2022-05-20 ENCOUNTER — Other Ambulatory Visit: Payer: Self-pay | Admitting: Family Medicine

## 2022-05-25 ENCOUNTER — Other Ambulatory Visit: Payer: Self-pay

## 2022-05-25 ENCOUNTER — Inpatient Hospital Stay: Payer: PPO | Attending: Hematology | Admitting: Hematology

## 2022-05-25 VITALS — BP 132/67 | HR 79 | Temp 97.9°F | Resp 18 | Wt 189.2 lb

## 2022-05-25 DIAGNOSIS — Z7901 Long term (current) use of anticoagulants: Secondary | ICD-10-CM | POA: Insufficient documentation

## 2022-05-25 DIAGNOSIS — Z79899 Other long term (current) drug therapy: Secondary | ICD-10-CM | POA: Diagnosis not present

## 2022-05-25 DIAGNOSIS — E039 Hypothyroidism, unspecified: Secondary | ICD-10-CM | POA: Insufficient documentation

## 2022-05-25 DIAGNOSIS — D72820 Lymphocytosis (symptomatic): Secondary | ICD-10-CM

## 2022-05-25 DIAGNOSIS — D72829 Elevated white blood cell count, unspecified: Secondary | ICD-10-CM | POA: Diagnosis not present

## 2022-05-25 DIAGNOSIS — I4891 Unspecified atrial fibrillation: Secondary | ICD-10-CM | POA: Diagnosis not present

## 2022-05-25 DIAGNOSIS — I10 Essential (primary) hypertension: Secondary | ICD-10-CM | POA: Diagnosis not present

## 2022-05-25 NOTE — Progress Notes (Signed)
Marland Kitchen   HEMATOLOGY/ONCOLOGY CLINIC NOTE  Date of Service: 05/25/2022  Patient Care Team: Kathyrn Drown, MD as PCP - General (Family Medicine) [name], Dani Gobble, MD as PCP - Cardiology (Cardiology) Gala Romney Cristopher Estimable, MD as Consulting Physician (Gastroenterology) Brunetta Genera, MD as Consulting Physician (Hematology)  CHIEF COMPLAINTS/PURPOSE OF CONSULTATION:  B-cell chronic lymphoproliferative disorder  HISTORY OF PRESENTING ILLNESS:   [name] is a wonderful 86 y.o. female who has been referred to [place] by Dr .Wolfgang Phoenix, Elayne Snare, MD for evaluation and management of leukocytosis.  She has a history of hiatal hernia, hypertension, dyslipidemia, hypothyroidism who was noted to have lower urinary tract infection for which she was treated in urgent care on 12/03/2021 with ciprofloxacin and lower respiratory tract infection for which she visited urgent care on 1/15-2023 for which she was treated with doxycycline albuterol as needed and Mucinex.  Patient was also noted to have atrial fibrillation since her pneumonia and has been placed on Eliquis. She had a CT of the chest without contrast with Dr. Wolfgang Phoenix in light of persistent radiographic changes of pneumonia. This showed cardiomegaly, moderate-sized hiatal hernia and mild bilateral patchy interstitial lung disease in the peripheral areas compatible with chronic change cannot rule out pneumonitis.  Patient notes that she has been referred for an echocardiogram due to her cardiomegaly.  She also has been placed on diuretics per her primary care physician.  As part of her labs she has been noted to have some leukocytosis ranging between 10-13.7 over the last year with primarily lymphocytosis ranging from 4.5 through 6.9k,  She has been referred to [place] for further evaluation regarding this.  Patient currently notes no fevers no chills no night sweats no unexpected weight loss. Still has some fatigue and some mild shortness of breath with  exertion. No reported new lumps or bumps. No reported new abdominal pain or distention.   Patient was seen at an outside hospital in [place] by Dr. Delton Coombes and had a flow cytometry done on 09/05/2021 which showed a monoclonal B-cell population representing 27% of all lymphocytes.  Lymphocyte population lacked CD10 and had questionable dim CD5 expression, expresses pan B7 antigens including CD20.  INTERVAL HISTORY:  [name] is a 86 y.o. female here for evaluation and management of B-cell chronic lymphoproliferative disorder. She reports She is doing well with no new symptoms or concerns.  She notes persistent SOB. She further notes that her breathing issues are impeding her daily activities. She notices it every time she moves.  Labs done today were reviewed in detail.  PET/CT scan done 05/13/2022 was discussed with her.  We discussed FISH panel done 02/02/2022  MEDICAL HISTORY:  Past Medical History:  Diagnosis Date   Chronic Leukemia    Depression    H/O hiatal hernia    Hernia    Hernia    right inguinal hernia   Hypertension    Hypothyroidism    Leg swelling    Osteitis pubis ([place]) 05/30/2001   Osteopenia 03/30/2009   Thyroid disease     SURGICAL HISTORY: Past Surgical History:  Procedure Laterality Date   ABDOMINAL HYSTERECTOMY  03/1970   APPENDECTOMY  03/1966   BILATERAL SALPINGOOPHORECTOMY     CARDIOVERSION N/A 03/10/2022   Procedure: CARDIOVERSION;  Surgeon: Sanda Klein, MD;  Location: [place] [facility];  Service: Cardiovascular;  Laterality: N/A;   CATARACT EXTRACTION W/PHACO Right 09/25/2014   Procedure: CATARACT EXTRACTION PHACO AND INTRAOCULAR LENS PLACEMENT ([place]);  Surgeon: Elta Guadeloupe T. Gershon Crane, MD;  Location:  [facility];  Service: Ophthalmology;  Laterality: Right;  CDE:7.56   CATARACT EXTRACTION W/PHACO Left 10/09/2014   Procedure: CATARACT EXTRACTION PHACO AND INTRAOCULAR LENS PLACEMENT LEFT EYE CDE=6.51;  Surgeon: Elta Guadeloupe T. Gershon Crane, MD;  Location: [facility];   Service: Ophthalmology;  Laterality: Left;   [place]   COLONOSCOPY  March 2007   Repeat 10 years   COLONOSCOPY  11/06/2003    Internal and external hemorrhoids/Two tiny rectal distal sigmoid polyps cold biopsied/Otherwise within normal limits to the cecum   COLONOSCOPY N/A 08/29/2014   Dr. Gala Romney: Sessile polyp was found at the cecum status post cold snare polypectomy, tubular adenoma. No further surveillance unless clinical status changes   ESOPHAGOGASTRODUODENOSCOPY  2007   Dr. Gala Romney: normal esophagus, small tom oderate sized hiatal hernia, [place] lesions   SKIN CANCER EXCISION Left 11 1 2016   left arm squamous cancer   TUMOR REMOVAL  03/1968   ovarian    SOCIAL HISTORY: Social History   Socioeconomic History   Marital status: Widowed    Spouse name: Not on file   Number of children: Not on file   Years of education: Not on file   Highest education level: Not on file  Occupational History   Occupation: Retired    Fish farm manager: RETIRED    Comment: [employer], retired in [place] Use   Smoking status: Never   Smokeless tobacco: Never   Tobacco comments:    Never smoked  Substance and Sexual Activity   Alcohol use: Yes    Alcohol/week: 0.0 standard drinks of alcohol    Comment: occasional margarita   Drug use: No   Sexual activity: Yes    Birth control/protection: Surgical  Other Topics Concern   Not on file  Social History Narrative   Not on file   Social Determinants of Health   Financial Resource Strain: Low Risk  (05/27/2021)   Overall Financial Resource Strain (CARDIA)    Difficulty of Paying Living Expenses: Not hard at all  Food Insecurity: No Food Insecurity (05/27/2021)   Hunger Vital Sign    Worried About Running Out of Food in the Last Year: Never true    [place] in the Last Year: Never true  Transportation Needs: No Transportation Needs (05/27/2021)   PRAPARE - Hydrologist (Medical): No     Lack of Transportation (Non-Medical): No  Physical Activity: Sufficiently Active (05/27/2021)   Exercise Vital Sign    Days of Exercise per Week: 5 days    Minutes of Exercise per Session: 30 min  Stress: No Stress Concern Present (05/27/2021)   [place]    Feeling of Stress : Only a little  Social Connections: Socially Integrated (05/27/2021)   Social Connection and Isolation Panel [NHANES]    Frequency of Communication with Friends and Family: More than three times a week    Frequency of Social Gatherings with Friends and Family: More than three times a week    Attends Religious Services: 1 to 4 times per year    Active Member of [employer] or Organizations: Yes    Attends Archivist Meetings: 1 to 4 times per year    Marital Status: Married  Human resources officer Violence: Not At Risk (05/27/2021)   Humiliation, Afraid, Rape, and Kick questionnaire    Fear of Current or Ex-Partner: No    Emotionally Abused: No  Physically Abused: No    Sexually Abused: No    FAMILY HISTORY: Family History  Problem Relation Age of Onset   Dementia Mother    Heart disease Father    Cancer Father        throat   Cancer Sister        breast   Colon cancer Neg Hx    Lung disease Neg Hx    Autoimmune disease Neg Hx     ALLERGIES:  is allergic to erythromycin, aspirin, and ibuprofen.  MEDICATIONS:  Current Outpatient Medications  Medication Sig Dispense Refill   acetaminophen (TYLENOL) 650 MG CR tablet Take 650 mg by mouth every 8 (eight) hours as needed for pain.     albuterol (VENTOLIN HFA) 108 (90 Base) MCG/ACT inhaler INHALE 2 PUFFS INTO THE LUNGS EVERY 6 HOURS AS NEEDED FOR WHEEZING OR SHORTNESS OF BREATH 18 g 1   alendronate (FOSAMAX) 70 MG tablet TAKE 1 TABLET(70 MG) BY MOUTH EVERY 7 DAYS WITH A FULL GLASS OF WATER AND ON AN EMPTY STOMACH 4 tablet 5   ALPRAZolam (XANAX) 0.5 MG tablet TAKE 1 TABLET BY MOUTH TWICE  DAILY AS NEEDED 60 tablet 1   apixaban (ELIQUIS) 5 MG TABS tablet Take 1 tablet (5 mg total) by mouth 2 (two) times daily. 60 tablet 11   Cholecalciferol (VITAMIN D3) 2000 UNITS TABS Take 4,000 Units by mouth daily.     indapamide (LOZOL) 1.25 MG tablet TAKE 1 TABLET BY MOUTH EVERY MORNING 90 tablet 1   levothyroxine (SYNTHROID) 125 MCG tablet Take one tablet by mouth daily 90 tablet 1   metoprolol succinate (TOPROL-XL) 25 MG 24 hr tablet Take one tablet daily 90 tablet 1   Multiple Vitamin (MULTI-VITAMIN DAILY PO) Take 1 tablet by mouth daily.     pantoprazole (PROTONIX) 40 MG tablet TAKE 1 TABLET(40 MG) BY MOUTH DAILY 90 tablet 1   potassium chloride (KLOR-CON M) 10 MEQ tablet TAKE 1 TABLET(10 MEQ) BY MOUTH DAILY 90 tablet 1   sertraline (ZOLOFT) 50 MG tablet TAKE 1 TABLET(50 MG) BY MOUTH DAILY 90 tablet 1   No current facility-administered medications for this visit.    REVIEW OF SYSTEMS:    10 Point review of Systems was done is negative except as noted above.  PHYSICAL EXAMINATION: ECOG PERFORMANCE STATUS: 1 - Symptomatic but completely ambulatory  . Vitals:   05/25/22 0930  BP: 132/67  Pulse: 79  Resp: 18  Temp: 97.9 F (36.6 C)  SpO2: 95%    Filed Weights   05/25/22 0930  Weight: 189 lb 3.2 oz (85.8 kg)    .Body mass index is 31.01 kg/m. NAD GENERAL:alert, in no acute distress and comfortable SKIN: no acute rashes, no significant lesions EYES: conjunctiva are pink and non-injected, sclera anicteric NECK: supple, no JVD LYMPH:  no palpable lymphadenopathy in the cervical, axillary or inguinal regions LUNGS: clear to auscultation b/l with normal respiratory effort HEART: regular rate & rhythm ABDOMEN:  normoactive bowel sounds , non tender, not distended. Extremity: no pedal edema PSYCH: alert & oriented x 3 with fluent speech NEURO: no focal motor/sensory deficits  LABORATORY DATA:  I have reviewed the data as listed  .    Latest Ref Rng & Units 03/03/2022     9:20 AM 02/02/2022   12:17 PM 01/07/2022   12:04 PM  CBC  WBC 3.4 - 10.8 x10E3/uL 9.7  11.0  10.6   Hemoglobin 11.1 - 15.9 g/dL 11.6  11.6  12.3  Hematocrit 34.0 - 46.6 % 35.3  36.2  38.6   Platelets 150 - 450 x10E3/uL 389  318  425   .CBC    Component Value Date/Time   WBC 9.7 03/03/2022 0920   WBC 11.0 (H) 02/02/2022 1217   RBC 4.07 03/03/2022 0920   RBC 4.11 02/02/2022 1217   HGB 11.6 03/03/2022 0920   HCT 35.3 03/03/2022 0920   PLT 389 03/03/2022 0920   MCV 87 03/03/2022 0920   MCH 28.5 03/03/2022 0920   MCH 28.2 02/02/2022 1217   MCHC 32.9 03/03/2022 0920   MCHC 32.0 02/02/2022 1217   RDW 13.1 03/03/2022 0920   LYMPHSABS 4.7 (H) 02/02/2022 1217   LYMPHSABS 4.3 (H) 01/07/2022 1204   MONOABS 0.8 02/02/2022 1217   EOSABS 0.3 02/02/2022 1217   EOSABS 0.4 01/07/2022 1204   BASOSABS 0.0 02/02/2022 1217   BASOSABS 0.1 01/07/2022 1204        Latest Ref Rng & Units 04/07/2022    2:13 PM 03/03/2022    9:20 AM 02/02/2022   12:17 PM  CMP  Glucose 70 - 99 mg/dL  107  95   BUN 8 - 27 mg/dL  16  15   Creatinine 0.57 - 1.00 mg/dL  0.85  0.84   Sodium 134 - 144 mmol/L  143  137   Potassium 3.5 - 5.2 mmol/L  4.5  4.1   Chloride 96 - 106 mmol/L  102  100   CO2 20 - 29 mmol/L  25  30   Calcium 8.7 - 10.3 mg/dL  9.0  9.9   Total Protein 6.0 - 8.5 g/dL 6.7   7.6   Total Bilirubin 0.3 - 1.2 mg/dL   0.7   Alkaline Phos 38 - 126 U/L   57   AST 15 - 41 U/L   18   ALT 0 - 44 U/L   12    SURGICAL PATHOLOGY Surgical Pathology  CASE: [id number]  PATIENT: Nafeesa Mulroy  Flow Pathology Report      Clinical history: Lymphocytosis      DIAGNOSIS:   -Monoclonal B-cell population identified  -See comment   COMMENT:   Flow cytometric analysis of the lymphoid population shows a monoclonal,  lambda restricted B-cell population representing 27% of all lymphocytes.  This population expresses pan-B-cell antigens including CD20 with lack  of CD10, CD25 or CD103 expression.  There  is questionable dim CD5  expression.  The findings are consistent with a B-cell  lymphoproliferative process and may represent monoclonal B-cell  lymphocytosis especially in the absence of lymphadenopathy, organomegaly  or extra medullary tissue involvement.  Clinical correlation is  recommended.   GATING AND PHENOTYPIC ANALYSIS:   Gated population: Flow cytometric immunophenotyping is performed using  antibodies to the antigens listed in the table below. Electronic gates  are placed around a cell cluster displaying light scatter properties  corresponding to: lymphocytes (total lymphocyte count is 5.6 K/uL)   Abnormal Cells in gated population: 27%   Phenotype of Abnormal Cells: CD19, CD20, CD22, CD200, Lambda    FISH panel done 02/02/2022   RADIOGRAPHIC STUDIES: I have personally reviewed the radiological images as listed and agreed with the findings in the report.  NM PET Image Initial (PI) Skull Base To Thigh  Result Date: 05/14/2022 CLINICAL DATA:  Initial treatment strategy for recent diagnosis of B-cell chronic lymphoproliferative disorder. EXAM: NUCLEAR MEDICINE PET SKULL BASE TO THIGH TECHNIQUE: 9.3 mCi F-18 FDG was injected intravenously. Full-ring PET imaging was performed  from the skull base to thigh after the radiotracer. CT data was obtained and used for attenuation correction and anatomic localization. Fasting blood glucose: 111 mg/dl COMPARISON:  Chest CT 02/23/2022 FINDINGS: Mediastinal blood pool activity: SUV max 2.49 Liver activity: SUV max 3.99 NECK: No hypermetabolic lymph nodes in the neck. Incidental CT findings: none CHEST: No hypermetabolic mediastinal or hilar nodes. No suspicious pulmonary nodules on the CT scan. Incidental CT findings: Mild age related cardiac enlargement. Moderate-sized hiatal hernia. Chronic pulmonary scarring changes. ABDOMEN/PELVIS: Choose 1 Incidental CT findings: Surgical changes in the upper abdomen with moderate artifact. Scattered  vascular calcifications but no aneurysm. Simple right renal cyst. Moderate-sized right inguinal hernia containing a small bowel loop but no incarceration or obstruction. SKELETON: No focal hypermetabolic activity to suggest skeletal metastasis. Incidental CT findings: none IMPRESSION: No enlarged or hypermetabolic lymphadenopathy is identified in the neck, chest, abdomen or pelvis. No abnormal osseous findings. Electronically Signed   By: Marijo Sanes M.D.   On: 05/14/2022 20:37   MM 3D SCREEN BREAST BILATERAL  Result Date: 05/01/2022 CLINICAL DATA:  Screening. EXAM: DIGITAL SCREENING BILATERAL MAMMOGRAM WITH TOMOSYNTHESIS AND CAD TECHNIQUE: Bilateral screening digital craniocaudal and mediolateral oblique mammograms were obtained. Bilateral screening digital breast tomosynthesis was performed. The images were evaluated with computer-aided detection. COMPARISON:  Previous exam(s). ACR Breast Density Category b: There are scattered areas of fibroglandular density. FINDINGS: There are no findings suspicious for malignancy. IMPRESSION: No mammographic evidence of malignancy. A result letter of this screening mammogram will be mailed directly to the patient. RECOMMENDATION: Screening mammogram in one year. (Code:SM-B-01Y) BI-RADS CATEGORY  1: Negative. Electronically Signed   By: Lovey Newcomer M.D.   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  This could represent monoclonal B-cell lymphocytosis versus CLL versus other low-grade chronic lymphoproliferative disorder. 21 Plan -I discussed all the available labs and imaging studies with the patient. -We discussed likely diagnosis  of monoclonal B lymphocytosis. -Patient clinically does not have lymphadenopathy or splenomegaly to suggest diagnosis of CLL and the absolute number of clonal B lymphocytes does not cross 5000 per cubic microliter to suggest CLL. -PET/CT scan done 05/13/2022 revealed no abnormal findings. 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 No other abnormal findings noted. -Labs ordered for today as noted below.  No orders of the defined types were placed in this encounter.   Follow-up RTC with Dr Irene Limbo with labs in 6 months  All of the patients questions were answered with apparent satisfaction. The patient knows to call the clinic with any problems, questions or concerns.    The total time spent in the appointment was 21 minutes*.  All of the patient's questions were answered with apparent satisfaction. The patient knows to call the clinic with any problems, questions or concerns.   Sullivan Lone MD MS AAHIVMS [facility] Hematology/Oncology Physician [facility]  .*Total Encounter Time as defined by the Centers for Medicare and Medicaid Services includes, in addition to the face-to-face time of a patient visit (documented in the note above) non-face-to-face time: obtaining and reviewing outside history, ordering and reviewing medications, tests or procedures, care coordination (communications with other health care professionals or caregivers)  and documentation in the medical record.  I, Melene Muller, am acting as scribe for Dr. Sullivan Lone, MD.  .I have reviewed the above documentation for accuracy and completeness, and I agree with the above. Brunetta Genera MD

## 2022-05-26 ENCOUNTER — Telehealth: Payer: Self-pay | Admitting: Hematology

## 2022-05-27 DIAGNOSIS — J84112 Idiopathic pulmonary fibrosis: Secondary | ICD-10-CM | POA: Diagnosis not present

## 2022-05-27 DIAGNOSIS — Z515 Encounter for palliative care: Secondary | ICD-10-CM | POA: Diagnosis not present

## 2022-06-08 NOTE — Patient Instructions (Signed)
[name] , Thank you for taking time to come for your Medicare Wellness Visit. I appreciate your ongoing commitment to your health goals. Please review the following plan we discussed and let me know if I can assist you in the future.   Screening recommendations/referrals: Colonoscopy: No longer required. Mammogram: Done 04/30/2022 Repeat annually  Bone Density: Done 06/06/2020. Repeat every 2 years  Recommended yearly ophthalmology/optometry visit for glaucoma screening and checkup Recommended yearly dental visit for hygiene and checkup  Vaccinations: Influenza vaccine: Done 08/30/2021 Repeat annually  Pneumococcal vaccine: Done 10/05/2017 and 06/19/2014. Tdap vaccine: Due every 10 years. Shingles vaccine: Done 10/05/2012. Shingrix is available at your local pharmacy.   Covid-19:Done 12/26/2019 and 12/14/2019.  Advanced directives: Please bring a copy of your health care power of attorney and living will to the office to be added to your chart at your convenience.   Conditions/risks identified: Aim for 30 minutes of exercise or brisk walking, 6-8 glasses of water, and 5 servings of fruits and vegetables each day.   Next appointment: Follow up in one year for your annual wellness visit 2024.   Preventive Care 41 Years and Older, Female Preventive care refers to lifestyle choices and visits with your health care provider that can promote health and wellness. What does preventive care include? A yearly physical exam. This is also called an annual well check. Dental exams once or twice a year. Routine eye exams. Ask your health care provider how often you should have your eyes checked. Personal lifestyle choices, including: Daily care of your teeth and gums. Regular physical activity. Eating a healthy diet. Avoiding tobacco and drug use. Limiting alcohol use. Practicing safe sex. Taking low-dose aspirin every day. Taking vitamin and mineral supplements as recommended by your health  care provider. What happens during an annual well check? The services and screenings done by your health care provider during your annual well check will depend on your age, overall health, lifestyle risk factors, and family history of disease. Counseling  Your health care provider may ask you questions about your: Alcohol use. Tobacco use. Drug use. Emotional well-being. Home and relationship well-being. Sexual activity. Eating habits. History of falls. Memory and ability to understand (cognition). Work and work Statistician. Reproductive health. Screening  You may have the following tests or measurements: Height, weight, and BMI. Blood pressure. Lipid and cholesterol levels. These may be checked every 5 years, or more frequently if you are over 20 years old. Skin check. Lung cancer screening. You may have this screening every year starting at age 59 if you have a 30-pack-year history of smoking and currently smoke or have quit within the past 15 years. Fecal occult blood test (FOBT) of the stool. You may have this test every year starting at age 84. Flexible sigmoidoscopy or colonoscopy. You may have a sigmoidoscopy every 5 years or a colonoscopy every 10 years starting at age 17. Hepatitis C blood test. Hepatitis B blood test. Sexually transmitted disease (STD) testing. Diabetes screening. This is done by checking your blood sugar (glucose) after you have not eaten for a while (fasting). You may have this done every 1-3 years. Bone density scan. This is done to screen for osteoporosis. You may have this done starting at age 57. Mammogram. This may be done every 1-2 years. Talk to your health care provider about how often you should have regular mammograms. Talk with your health care provider about your test results, treatment options, and if necessary, the need for more  tests. Vaccines  Your health care provider may recommend certain vaccines, such as: Influenza vaccine. This is  recommended every year. Tetanus, diphtheria, and acellular pertussis (Tdap, Td) vaccine. You may need a Td booster every 10 years. Zoster vaccine. You may need this after age 97. Pneumococcal 13-valent conjugate (PCV13) vaccine. One dose is recommended after age 93. Pneumococcal polysaccharide (PPSV23) vaccine. One dose is recommended after age 83. Talk to your health care provider about which screenings and vaccines you need and how often you need them. This information is not intended to replace advice given to you by your health care provider. Make sure you discuss any questions you have with your health care provider. Document Released: 12/13/2015 Document Revised: 08/05/2016 Document Reviewed: 09/17/2015 Elsevier Interactive Patient Education  2017 [place] Prevention in the Home Falls can cause injuries. They can happen to people of all ages. There are many things you can do to make your home safe and to help prevent falls. What can I do on the outside of my home? Regularly fix the edges of walkways and driveways and fix any cracks. Remove anything that might make you trip as you walk through a door, such as a raised step or threshold. Trim any bushes or trees on the path to your home. Use bright outdoor lighting. Clear any walking paths of anything that might make someone trip, such as rocks or tools. Regularly check to see if handrails are loose or broken. Make sure that both sides of any steps have handrails. Any raised decks and porches should have guardrails on the edges. Have any leaves, snow, or ice cleared regularly. Use sand or salt on walking paths during winter. Clean up any spills in your garage right away. This includes oil or grease spills. What can I do in the bathroom? Use night lights. Install grab bars by the toilet and in the tub and shower. Do not use towel bars as grab bars. Use non-skid mats or decals in the tub or shower. If you need to sit down in  the shower, use a plastic, non-slip stool. Keep the floor dry. Clean up any water that spills on the floor as soon as it happens. Remove soap buildup in the tub or shower regularly. Attach bath mats securely with double-sided non-slip rug tape. Do not have throw rugs and other things on the floor that can make you trip. What can I do in the bedroom? Use night lights. Make sure that you have a light by your bed that is easy to reach. Do not use any sheets or blankets that are too big for your bed. They should not hang down onto the floor. Have a firm chair that has side arms. You can use this for support while you get dressed. Do not have throw rugs and other things on the floor that can make you trip. What can I do in the kitchen? Clean up any spills right away. Avoid walking on wet floors. Keep items that you use a lot in easy-to-reach places. If you need to reach something above you, use a strong step stool that has a grab bar. Keep electrical cords out of the way. Do not use floor polish or wax that makes floors slippery. If you must use wax, use non-skid floor wax. Do not have throw rugs and other things on the floor that can make you trip. What can I do with my stairs? Do not leave any items on the stairs. Make sure  that there are handrails on both sides of the stairs and use them. Fix handrails that are broken or loose. Make sure that handrails are as long as the stairways. Check any carpeting to make sure that it is firmly attached to the stairs. Fix any carpet that is loose or worn. Avoid having throw rugs at the top or bottom of the stairs. If you do have throw rugs, attach them to the floor with carpet tape. Make sure that you have a light switch at the top of the stairs and the bottom of the stairs. If you do not have them, ask someone to add them for you. What else can I do to help prevent falls? Wear shoes that: Do not have high heels. Have rubber bottoms. Are comfortable  and fit you well. Are closed at the toe. Do not wear sandals. If you use a stepladder: Make sure that it is fully opened. Do not climb a closed stepladder. Make sure that both sides of the stepladder are locked into place. Ask someone to hold it for you, if possible. Clearly mark and make sure that you can see: Any grab bars or handrails. First and last steps. Where the edge of each step is. Use tools that help you move around (mobility aids) if they are needed. These include: Canes. Walkers. Scooters. Crutches. Turn on the lights when you go into a dark area. Replace any light bulbs as soon as they burn out. Set up your furniture so you have a clear path. Avoid moving your furniture around. If any of your floors are uneven, fix them. If there are any pets around you, be aware of where they are. Review your medicines with your doctor. Some medicines can make you feel dizzy. This can increase your chance of falling. Ask your doctor what other things that you can do to help prevent falls. This information is not intended to replace advice given to you by your health care provider. Make sure you discuss any questions you have with your health care provider. Document Released: 09/12/2009 Document Revised: 04/23/2016 Document Reviewed: 12/21/2014 Elsevier Interactive Patient Education  2017 [employer].

## 2022-06-09 ENCOUNTER — Ambulatory Visit (INDEPENDENT_AMBULATORY_CARE_PROVIDER_SITE_OTHER): Payer: PPO

## 2022-06-09 VITALS — BP 128/72 | HR 74 | Ht 65.5 in | Wt 191.8 lb

## 2022-06-09 DIAGNOSIS — Z0000 Encounter for general adult medical examination without abnormal findings: Secondary | ICD-10-CM | POA: Diagnosis not present

## 2022-06-09 NOTE — Progress Notes (Signed)
Subjective:   DUDLEY COOLEY is a 86 y.o. female who presents for Medicare Annual (Subsequent) preventive examination. IN OFFICE VISIT Review of Systems     Cardiac Risk Factors include: advanced age (>58mn, >>76women);dyslipidemia;hypertension;sedentary lifestyle;obesity (BMI >30kg/m2)     Objective:    Today's Vitals   06/09/22 1307  BP: 128/72  Pulse: 74  SpO2: 97%  Weight: 191 lb 12.8 oz (87 kg)  Height: 5' 5.5" (1.664 m)   Body mass index is 31.43 kg/m.     06/09/2022    1:36 PM 03/10/2022    8:23 AM 10/06/2021   11:58 AM 09/05/2021   11:05 AM 05/27/2021    1:12 PM 10/09/2014    6:35 AM 09/25/2014    8:16 AM  Advanced Directives  Does Patient Have a Medical Advance Directive? Yes No No No No No No  Type of [place]        Copy of [place] Chart? No - copy requested        Would patient like information on creating a medical advance directive?  No - Patient declined No - Patient declined No - Patient declined No - Patient declined No - patient declined information No - patient declined information    Current Medications (verified) Outpatient Encounter Medications as of 06/09/2022  Medication Sig   acetaminophen (TYLENOL) 650 MG CR tablet Take 650 mg by mouth every 8 (eight) hours as needed for pain.   albuterol (VENTOLIN HFA) 108 (90 Base) MCG/ACT inhaler INHALE 2 PUFFS INTO THE LUNGS EVERY 6 HOURS AS NEEDED FOR WHEEZING OR SHORTNESS OF BREATH   alendronate (FOSAMAX) 70 MG tablet TAKE 1 TABLET(70 MG) BY MOUTH EVERY 7 DAYS WITH A FULL GLASS OF WATER AND ON AN EMPTY STOMACH   ALPRAZolam (XANAX) 0.5 MG tablet TAKE 1 TABLET BY MOUTH TWICE DAILY AS NEEDED   apixaban (ELIQUIS) 5 MG TABS tablet Take 1 tablet (5 mg total) by mouth 2 (two) times daily.   Cholecalciferol (VITAMIN D3) 2000 UNITS TABS Take 4,000 Units by mouth daily.   indapamide (LOZOL) 1.25 MG tablet TAKE 1 TABLET BY MOUTH EVERY MORNING    levothyroxine (SYNTHROID) 125 MCG tablet Take one tablet by mouth daily   metoprolol succinate (TOPROL-XL) 25 MG 24 hr tablet Take one tablet daily   Multiple Vitamin (MULTI-VITAMIN DAILY PO) Take 1 tablet by mouth daily.   pantoprazole (PROTONIX) 40 MG tablet TAKE 1 TABLET(40 MG) BY MOUTH DAILY   potassium chloride (KLOR-CON M) 10 MEQ tablet TAKE 1 TABLET(10 MEQ) BY MOUTH DAILY   sertraline (ZOLOFT) 50 MG tablet TAKE 1 TABLET(50 MG) BY MOUTH DAILY   [DISCONTINUED] albuterol (VENTOLIN HFA) 108 (90 Base) MCG/ACT inhaler Inhale 2 puffs into the lungs every 6 (six) hours as needed for wheezing or shortness of breath.   [DISCONTINUED] ALPRAZolam (XANAX) 0.5 MG tablet 1 bid prn (Patient taking differently: Take 0.25-0.5 mg by mouth at bedtime.)   [DISCONTINUED] guaiFENesin (MUCINEX) 600 MG 12 hr tablet Take 1 tablet (600 mg total) by mouth 2 (two) times daily.   [DISCONTINUED] pantoprazole (PROTONIX) 40 MG tablet TAKE 1 TABLET(40 MG) BY MOUTH DAILY   No facility-administered encounter medications on file as of 06/09/2022.    Allergies (verified) Erythromycin, Aspirin, and Ibuprofen   History: Past Medical History:  Diagnosis Date   Chronic Leukemia    Depression    H/O hiatal hernia    Hernia    Hernia  right inguinal hernia   Hypertension    Hypothyroidism    Leg swelling    Osteitis pubis ([place]) 05/30/2001   Osteopenia 03/30/2009   Thyroid disease    Past Surgical History:  Procedure Laterality Date   ABDOMINAL HYSTERECTOMY  03/1970   APPENDECTOMY  03/1966   BILATERAL SALPINGOOPHORECTOMY     CARDIOVERSION N/A 03/10/2022   Procedure: CARDIOVERSION;  Surgeon: Sanda Klein, MD;  Location: [facility];  Service: Cardiovascular;  Laterality: N/A;   CATARACT EXTRACTION W/PHACO Right 09/25/2014   Procedure: CATARACT EXTRACTION PHACO AND INTRAOCULAR LENS PLACEMENT ([place]);  Surgeon: Elta Guadeloupe T. Gershon Crane, MD;  Location: [facility];  Service: Ophthalmology;  Laterality: Right;  CDE:7.56    CATARACT EXTRACTION W/PHACO Left 10/09/2014   Procedure: CATARACT EXTRACTION PHACO AND INTRAOCULAR LENS PLACEMENT LEFT EYE CDE=6.51;  Surgeon: Elta Guadeloupe T. Gershon Crane, MD;  Location: [facility];  Service: Ophthalmology;  Laterality: Left;   [place]   COLONOSCOPY  March 2007   Repeat 10 years   COLONOSCOPY  11/06/2003    Internal and external hemorrhoids/Two tiny rectal distal sigmoid polyps cold biopsied/Otherwise within normal limits to the cecum   COLONOSCOPY N/A 08/29/2014   Dr. Gala Romney: Sessile polyp was found at the cecum status post cold snare polypectomy, tubular adenoma. No further surveillance unless clinical status changes   ESOPHAGOGASTRODUODENOSCOPY  2007   Dr. Gala Romney: normal esophagus, small tom oderate sized hiatal hernia, [place] lesions   SKIN CANCER EXCISION Left 11 1 2016   left arm squamous cancer   TUMOR REMOVAL  03/1968   ovarian   Family History  Problem Relation Age of Onset   Dementia Mother    Heart disease Father    Cancer Father        throat   Cancer Sister        breast   Colon cancer Neg Hx    Lung disease Neg Hx    Autoimmune disease Neg Hx    Social History   Socioeconomic History   Marital status: Widowed    Spouse name: Not on file   Number of children: 1   Years of education: 23   Highest education level: Not on file  Occupational History   Occupation: Retired    Fish farm manager: RETIRED    Comment: [employer], retired in [place] Use   Smoking status: Never   Smokeless tobacco: Never   Tobacco comments:    Never smoked  Substance and Sexual Activity   Alcohol use: Yes    Alcohol/week: 0.0 standard drinks of alcohol    Comment: occasional margarita   Drug use: No   Sexual activity: Not Currently    Birth control/protection: Surgical  Other Topics Concern   Not on file  Social History Narrative   Retired from [employer].   Widowed since 08/03/21. Husband W.E. Sarkis, died from Kidney Cancer.   Son-Tommy lives nearby.   2  grandson's deceased, Clint and Erlene Quan. Erlene Quan passed 02/2022 from Brain Cancer.    Social Determinants of Health   Financial Resource Strain: Low Risk  (05/27/2021)   Overall Financial Resource Strain (CARDIA)    Difficulty of Paying Living Expenses: Not hard at all  Food Insecurity: No Food Insecurity (06/09/2022)   Hunger Vital Sign    Worried About Running Out of Food in the Last Year: Never true    Ran Out of Food in the Last Year: Never true  Transportation Needs: No Transportation Needs (06/09/2022)   PRAPARE -  Hydrologist (Medical): No    Lack of Transportation (Non-Medical): No  Physical Activity: Sufficiently Active (06/09/2022)   Exercise Vital Sign    Days of Exercise per Week: 5 days    Minutes of Exercise per Session: 30 min  Stress: Stress Concern Present (06/09/2022)   [place]    Feeling of Stress : Rather much  Social Connections: Moderately Integrated (06/09/2022)   Social Connection and Isolation Panel [NHANES]    Frequency of Communication with Friends and Family: More than three times a week    Frequency of Social Gatherings with Friends and Family: More than three times a week    Attends Religious Services: More than 4 times per year    Active Member of [employer] or Organizations: Yes    Attends Archivist Meetings: More than 4 times per year    Marital Status: Widowed    Tobacco Counseling Counseling given: Not Answered Tobacco comments: Never smoked   Clinical Intake:  Pre-visit preparation completed: Yes  Pain : No/denies pain     BMI - recorded: 31.43 Nutritional Status: BMI > 30  Obese Nutritional Risks: None Diabetes: No  How often do you need to have someone help you when you read instructions, pamphlets, or other written materials from your doctor or pharmacy?: 1 - Never  Diabetic?NO  Interpreter Needed?: No  Information entered by ::  mj Bethany Cumming,lpn   Activities of Daily Living    06/09/2022    1:47 PM  In your present state of health, do you have any difficulty performing the following activities:  Hearing? 1  Vision? 0  Difficulty concentrating or making decisions? 0  Walking or climbing stairs? 0  Dressing or bathing? 0  Doing errands, shopping? 0  Preparing Food and eating ? N  Using the Toilet? N  In the past six months, have you accidently leaked urine? N  Do you have problems with loss of bowel control? N  Managing your Medications? N  Managing your Finances? N  Housekeeping or managing your Housekeeping? N    Patient Care Team: Kathyrn Drown, MD as PCP - General (Family Medicine) [name], Dani Gobble, MD as PCP - Cardiology (Cardiology) Gala Romney Cristopher Estimable, MD as Consulting Physician (Gastroenterology) Brunetta Genera, MD as Consulting Physician (Hematology)  Indicate any recent Medical Services you may have received from other than [facility] providers in the past year (date may be approximate).     Assessment:   This is a routine wellness examination for Aerie.  Hearing/Vision screen Hearing Screening - Comments:: Some hearing loss.  Vision Screening - Comments:: No glasses. Dr. Gershon Crane. 2022.  Dietary issues and exercise activities discussed: Current Exercise Habits: Home exercise routine, Type of exercise: walking, Time (Minutes): 30, Frequency (Times/Week): 5, Weekly Exercise (Minutes/Week): 150, Intensity: Mild, Exercise limited by: cardiac condition(s)   Goals Addressed             This Visit's Progress    DIET - REDUCE SUGAR INTAKE   On track    Prevent falls   On track      Depression Screen    06/09/2022    1:32 PM 07/28/2021   10:18 AM 05/27/2021    1:14 PM 10/21/2020    3:03 PM 09/06/2018    9:58 AM 05/12/2018   10:09 AM 03/31/2018   11:10 AM  PHQ 2/9 Scores  PHQ - 2 Score 3 0 0 2 0 0  1  PHQ- 9 Score '5   8 1 3     '$ Fall Risk    06/09/2022    1:41 PM 10/07/2021   10:50 AM  07/28/2021   10:17 AM 05/27/2021    1:13 PM 04/21/2021   10:13 AM  Fall Risk   Falls in the past year? 0 0 0 0 0  Number falls in past yr: 0  0 0   Injury with Fall? 0  0 0   Risk for fall due to : No Fall Risks No Fall Risks No Fall Risks No Fall Risks No Fall Risks  Follow up Falls prevention discussed Falls evaluation completed Falls evaluation completed Falls evaluation completed;Falls prevention discussed Falls evaluation completed    FALL RISK PREVENTION PERTAINING TO THE HOME:  Any stairs in or around the home? Yes  If so, are there any without handrails? No  Home free of loose throw rugs in walkways, pet beds, electrical cords, etc? Yes  Adequate lighting in your home to reduce risk of falls? Yes   ASSISTIVE DEVICES UTILIZED TO PREVENT FALLS:  Life alert? No  Use of a cane, walker or w/c? No  Grab bars in the bathroom? Yes  Shower chair or bench in shower? No  Elevated toilet seat or a handicapped toilet? Yes   TIMED UP AND GO:  Was the test performed? Yes .  Length of time to ambulate 10 feet: 12 sec.   Gait steady and fast without use of assistive device  Cognitive Function:        06/09/2022    1:48 PM  6CIT Screen  What Year? 0 points  What month? 0 points  What time? 0 points  Count back from 20 0 points  Months in reverse 0 points  Repeat phrase 0 points  Total Score 0 points    Immunizations Immunization History  Administered Date(s) Administered   Fluad Quad(high Dose 65+) 09/24/2020   Influenza, High Dose Seasonal PF 08/30/2021   Influenza,inj,Quad PF,6+ Mos 09/06/2018   Influenza-Unspecified 08/31/2015, 08/06/2016, 09/16/2017, 10/19/2019   PFIZER(Purple Top)SARS-COV-2 Vaccination 12/04/2019, 12/26/2019   Pneumococcal Conjugate-13 06/19/2014   Pneumococcal Polysaccharide-23 10/05/2017   Zoster, Live 10/05/2012    TDAP status: Due, Education has been provided regarding the importance of this vaccine. Advised may receive this vaccine at local  pharmacy or Health Dept. Aware to provide a copy of the vaccination record if obtained from local pharmacy or Health Dept. Verbalized acceptance and understanding.  Flu Vaccine status: Up to date  Pneumococcal vaccine status: Up to date  Covid-19 vaccine status: Completed vaccines  Qualifies for Shingles Vaccine? Yes   Zostavax completed Yes   Shingrix Completed?: No.    Education has been provided regarding the importance of this vaccine. Patient has been advised to call insurance company to determine out of pocket expense if they have not yet received this vaccine. Advised may also receive vaccine at local pharmacy or Health Dept. Verbalized acceptance and understanding.  Screening Tests Health Maintenance  Topic Date Due   TETANUS/TDAP  Never done   Zoster Vaccines- Shingrix (1 of 2) Never done   COVID-19 Vaccine (3 - Pfizer risk series) 01/23/2020   INFLUENZA VACCINE  06/30/2022   Pneumonia Vaccine 64+ Years old  Completed   DEXA SCAN  Completed   HPV VACCINES  Aged Out    Health Maintenance  Health Maintenance Due  Topic Date Due   TETANUS/TDAP  Never done   Zoster Vaccines- Shingrix (1 of  2) Never done   COVID-19 Vaccine (3 - Pfizer risk series) 01/23/2020    Colorectal cancer screening: No longer required.   Mammogram status: Completed 04/30/2022. Repeat every year  Bone Density status: Completed 06/06/2020. Results reflect: Bone density results: OSTEOPENIA. Repeat every 2 years.  Lung Cancer Screening: (Low Dose CT Chest recommended if Age 48-80 years, 30 pack-year currently smoking OR have quit w/in 15years.) does not qualify.   Lung Cancer Screening Referral: Currently being follow by Pulmonology due to Interstitial Lung Disease.  Additional Screening:  Hepatitis C Screening: does not qualify;  Vision Screening: Recommended annual ophthalmology exams for early detection of glaucoma and other disorders of the eye. Is the patient up to date with their annual eye  exam?  Yes  Who is the provider or what is the name of the office in which the patient attends annual eye exams? Dr. Gershon Crane If pt is not established with a provider, would they like to be referred to a provider to establish care? No .   Dental Screening: Recommended annual dental exams for proper oral hygiene  Community Resource Referral / Chronic Care Management: CRR required this visit?  No   CCM required this visit?  No      Plan:     I have personally reviewed and noted the following in the patient's chart:   Medical and social history Use of alcohol, tobacco or illicit drugs  Current medications and supplements including opioid prescriptions.  Functional ability and status Nutritional status Physical activity Advanced directives List of other physicians Hospitalizations, surgeries, and ER visits in previous 12 months Vitals Screenings to include cognitive, depression, and falls Referrals and appointments  In addition, I have reviewed and discussed with patient certain preventive protocols, quality metrics, and best practice recommendations. A written personalized care plan for preventive services as well as general preventive health recommendations were provided to patient.     Chriss Driver, LPN   6/46/8032   Nurse Notes: Pt c/o issues with depression since death of husband in 25-Jul-2021 and grandson in April of 2023. Offered counseling services. Pt states she will meet with her Doristine Bosworth but will reach out to [place], if additional services are needed.

## 2022-06-16 ENCOUNTER — Ambulatory Visit (INDEPENDENT_AMBULATORY_CARE_PROVIDER_SITE_OTHER): Payer: PPO | Admitting: Family Medicine

## 2022-06-16 VITALS — BP 124/78 | Ht 65.5 in | Wt 190.0 lb

## 2022-06-16 DIAGNOSIS — I4819 Other persistent atrial fibrillation: Secondary | ICD-10-CM | POA: Diagnosis not present

## 2022-06-16 DIAGNOSIS — R519 Headache, unspecified: Secondary | ICD-10-CM

## 2022-06-16 DIAGNOSIS — S90112A Contusion of left great toe without damage to nail, initial encounter: Secondary | ICD-10-CM | POA: Diagnosis not present

## 2022-06-16 MED ORDER — POTASSIUM CHLORIDE CRYS ER 10 MEQ PO TBCR: EXTENDED_RELEASE_TABLET | ORAL | 1 refills | Status: DC

## 2022-06-16 MED ORDER — LEVOTHYROXINE SODIUM 125 MCG PO TABS: ORAL_TABLET | ORAL | 1 refills | Status: DC

## 2022-06-16 MED ORDER — INDAPAMIDE 1.25 MG PO TABS: ORAL_TABLET | ORAL | 1 refills | Status: DC

## 2022-06-16 MED ORDER — SERTRALINE HCL 50 MG PO TABS: ORAL_TABLET | ORAL | 1 refills | Status: DC

## 2022-06-16 MED ORDER — METOPROLOL SUCCINATE ER 25 MG PO TB24: ORAL_TABLET | ORAL | 1 refills | Status: DC

## 2022-06-16 MED ORDER — ALPRAZOLAM 0.5 MG PO TABS
ORAL_TABLET | ORAL | 4 refills | Status: DC
Start: 2022-06-16 — End: 2022-12-03

## 2022-06-16 NOTE — Progress Notes (Signed)
   Subjective:    Patient ID: [name], female    DOB: 06-18-1936, 86 y.o.   MRN: [id number]  HPI  Follow for swelling of feet She relates minimal swelling in the ankles not as bad as what it once was denies any chest tightness pressure pain or shortness of breath  She is having frequent headaches please see previous note we started off over the past couple months should be noted that she did lose her husband several months ago she has been going through grief but dealing with it as best she can she states he has headaches almost always early in the morning cause moderate headache slight nausea no double vision or blurred vision no vomiting and typically gets better with Tylenol but she has never had problems with headaches before this when they are occurring at least 4 or 5 times per week that she describes as severe throbbing in her head  [place] this morning while getting up from chair tripped and fell to knees and toes she tripped she complains of pain in the left great toe denies much trouble otherwise able to walk now  Review of Systems     Objective:   Physical Exam General-in no acute distress Eyes-no discharge Lungs-respiratory rate normal, CTA CV-no murmurs Extremities skin warm dry no edema Neuro grossly normal Behavior normal, alert  Tenderness in the left great toe foot exam normal      Assessment & Plan:   Grief patient is getting over the loss of her husband this has been difficult but she is making progress  GAD as for the alprazolam I encouraged them as she gets older she needs to taper down I would recommend no more than a half a tablet during the day and a full tablet in the evening as needed she states she has been on this medicine 25 to 30 years  1. Frequent headaches She is having frequent early morning headaches this is unusual for her this is been present over the past couple months multiple per week some nausea with it no numbness or tingling.  Given  that these headaches are frequent wake her up in the middle of the morning and is new onset headaches I would recommend MRI  2. Persistent atrial fibrillation (HCC) Under good control currently  3. Contusion of left great toe without damage to nail, initial encounter Contusion from a fall we did discuss balance also discussed no need for x-ray currently unless it gets progressively worse

## 2022-06-16 NOTE — Addendum Note (Signed)
Addended by: Sallee Lange A on: 06/16/2022 10:12 PM   Modules accepted: Orders

## 2022-06-19 ENCOUNTER — Ambulatory Visit (INDEPENDENT_AMBULATORY_CARE_PROVIDER_SITE_OTHER): Payer: PPO

## 2022-06-19 ENCOUNTER — Ambulatory Visit
Admission: EM | Admit: 2022-06-19 | Discharge: 2022-06-19 | Disposition: A | Discharge status: Home / Self Care | Payer: PPO | Attending: Nurse Practitioner | Admitting: Nurse Practitioner

## 2022-06-19 DIAGNOSIS — W19XXXA Unspecified fall, initial encounter: Secondary | ICD-10-CM

## 2022-06-19 DIAGNOSIS — S92502A Displaced unspecified fracture of left lesser toe(s), initial encounter for closed fracture: Secondary | ICD-10-CM

## 2022-06-19 DIAGNOSIS — M79672 Pain in left foot: Secondary | ICD-10-CM

## 2022-06-19 DIAGNOSIS — S92405A Nondisplaced unspecified fracture of left great toe, initial encounter for closed fracture: Secondary | ICD-10-CM | POA: Diagnosis not present

## 2022-06-19 DIAGNOSIS — M7989 Other specified soft tissue disorders: Secondary | ICD-10-CM | POA: Diagnosis not present

## 2022-06-19 DIAGNOSIS — M25562 Pain in left knee: Secondary | ICD-10-CM

## 2022-06-19 NOTE — ED Provider Notes (Signed)
[facility]    CSN: [id number] Arrival date & time: 06/19/22  1341      History   Chief Complaint Chief Complaint  Patient presents with   Fall   Knee Pain   Toe Pain    HPI [name] is a 86 y.o. female.   The history is provided by the patient.   Patient presents for complaints of left knee pain and pain in the great toe and second toe of the left foot.  Symptoms started after she fell approximately 2 days ago.  Patient states that she landed on both her knees after tripping over a rug after standing from a chair in her home.  Pain is present with ambulation.  She also notes that she has swelling and bruising of the left knee, left lower leg, and toes of the left foot.  Patient states that she was recently seen for swelling in her feet by her PCP, but states the swelling is worse.  She denies numbness, tingling, or radiation of pain.  She currently takes Eliquis for atrial fibrillation.  She has been taking Tylenol for her symptoms. Past Medical History:  Diagnosis Date   Chronic Leukemia    Depression    H/O hiatal hernia    Hernia    Hernia    right inguinal hernia   Hypertension    Hypothyroidism    Leg swelling    Osteitis pubis ([place]) 05/30/2001   Osteopenia 03/30/2009   Thyroid disease     Patient Active Problem List   Diagnosis Date Noted   Persistent atrial fibrillation ([place])    Chronic lymphocytic leukemia ([place]) 01/21/2022   Leukocytosis 09/05/2021   Depression, major, single episode, moderate ([place]) 05/12/2018   Dyspnea on exertion 05/24/2015   Bilateral leg edema 05/24/2015   Heme positive stool 08/13/2014   GERD (gastroesophageal reflux disease) 07/02/2014   Encounter for screening colonoscopy 07/02/2014   Hyperlipidemia 05/16/2013   Hypothyroidism 05/16/2013   Essential hypertension, benign 05/16/2013   Inguinal hernia, right 04/27/2012    Past Surgical History:  Procedure Laterality Date   ABDOMINAL HYSTERECTOMY  03/1970    APPENDECTOMY  03/1966   BILATERAL SALPINGOOPHORECTOMY     CARDIOVERSION N/A 03/10/2022   Procedure: CARDIOVERSION;  Surgeon: Sanda Klein, MD;  Location: [facility];  Service: Cardiovascular;  Laterality: N/A;   CATARACT EXTRACTION W/PHACO Right 09/25/2014   Procedure: CATARACT EXTRACTION PHACO AND INTRAOCULAR LENS PLACEMENT ([place]);  Surgeon: Elta Guadeloupe T. Gershon Crane, MD;  Location: [facility];  Service: Ophthalmology;  Laterality: Right;  CDE:7.56   CATARACT EXTRACTION W/PHACO Left 10/09/2014   Procedure: CATARACT EXTRACTION PHACO AND INTRAOCULAR LENS PLACEMENT LEFT EYE CDE=6.51;  Surgeon: Elta Guadeloupe T. Gershon Crane, MD;  Location: [facility];  Service: Ophthalmology;  Laterality: Left;   [place]   COLONOSCOPY  March 2007   Repeat 10 years   COLONOSCOPY  11/06/2003    Internal and external hemorrhoids/Two tiny rectal distal sigmoid polyps cold biopsied/Otherwise within normal limits to the cecum   COLONOSCOPY N/A 08/29/2014   Dr. Gala Romney: Sessile polyp was found at the cecum status post cold snare polypectomy, tubular adenoma. No further surveillance unless clinical status changes   ESOPHAGOGASTRODUODENOSCOPY  2007   Dr. Gala Romney: normal esophagus, small tom oderate sized hiatal hernia, [place] lesions   SKIN CANCER EXCISION Left 11 1 2016   left arm squamous cancer   TUMOR REMOVAL  03/1968   ovarian    OB History   No  obstetric history on file.      Home Medications    Prior to Admission medications   Medication Sig Start Date End Date Taking? Authorizing Provider  acetaminophen (TYLENOL) 650 MG CR tablet Take 650 mg by mouth every 8 (eight) hours as needed for pain.    [provider]  albuterol (VENTOLIN HFA) 108 (90 Base) MCG/ACT inhaler INHALE 2 PUFFS INTO THE LUNGS EVERY 6 HOURS AS NEEDED FOR WHEEZING OR SHORTNESS OF BREATH 05/20/22   Kathyrn Drown, MD  alendronate (FOSAMAX) 70 MG tablet TAKE 1 TABLET(70 MG) BY MOUTH EVERY 7 DAYS WITH A FULL GLASS OF WATER AND ON  AN EMPTY STOMACH 11/10/21   Kathyrn Drown, MD  ALPRAZolam Duanne Moron) 0.5 MG tablet May use 1/2 tablet during the day as needed, may use 1 tablet at nighttime 06/16/22   Luking, Elayne Snare, MD  apixaban (ELIQUIS) 5 MG TABS tablet Take 1 tablet (5 mg total) by mouth 2 (two) times daily. 02/19/22   [name], Mihai, MD  Cholecalciferol (VITAMIN D3) 2000 UNITS TABS Take 4,000 Units by mouth daily.    [provider]  indapamide (LOZOL) 1.25 MG tablet TAKE 1 TABLET BY MOUTH EVERY MORNING 06/16/22   Kathyrn Drown, MD  levothyroxine (SYNTHROID) 125 MCG tablet Take one tablet by mouth daily 06/16/22   Kathyrn Drown, MD  metoprolol succinate (TOPROL-XL) 25 MG 24 hr tablet Take one tablet daily 06/16/22   Kathyrn Drown, MD  Multiple Vitamin (MULTI-VITAMIN DAILY PO) Take 1 tablet by mouth daily.    [provider]  pantoprazole (PROTONIX) 40 MG tablet TAKE 1 TABLET(40 MG) BY MOUTH DAILY 05/18/22   Kathyrn Drown, MD  potassium chloride (KLOR-CON M) 10 MEQ tablet TAKE 1 TABLET(10 MEQ) BY MOUTH DAILY 06/16/22   Kathyrn Drown, MD  sertraline (ZOLOFT) 50 MG tablet 1 qd 06/16/22   Kathyrn Drown, MD    Family History Family History  Problem Relation Age of Onset   Dementia Mother    Heart disease Father    Cancer Father        throat   Cancer Sister        breast   Colon cancer Neg Hx    Lung disease Neg Hx    Autoimmune disease Neg Hx     Social History Social History   Tobacco Use   Smoking status: Never   Smokeless tobacco: Never   Tobacco comments:    Never smoked  Substance Use Topics   Alcohol use: Yes    Alcohol/week: 0.0 standard drinks of alcohol    Comment: occasional margarita   Drug use: No     Allergies   Erythromycin, Aspirin, and Ibuprofen   Review of Systems Review of Systems Per HPI  Physical Exam Triage Vital Signs ED Triage Vitals  Enc Vitals Group     BP 06/19/22 1422 131/81     Pulse Rate 06/19/22 1422 81     Resp 06/19/22 1422 18     Temp  06/19/22 1422 99.7 F (37.6 C)     Temp Source 06/19/22 1422 Oral     SpO2 06/19/22 1422 93 %     Weight --      Height --      Head Circumference --      Peak Flow --      Pain Score 06/19/22 1421 7     Pain Loc --      Pain Edu? --  Excl. in GC? --    No data found.  Updated Vital Signs BP 131/81 (BP Location: Right Arm)   Pulse 81   Temp 99.7 F (37.6 C) (Oral)   Resp 18   SpO2 93%   Visual Acuity Right Eye Distance:   Left Eye Distance:   Bilateral Distance:    Right Eye Near:   Left Eye Near:    Bilateral Near:     Physical Exam Vitals and nursing note reviewed.  Constitutional:      Appearance: Normal appearance. She is not toxic-appearing.  HENT:     Head: Normocephalic.  Eyes:     Conjunctiva/sclera: Conjunctivae normal.     Pupils: Pupils are equal, round, and reactive to light.  Cardiovascular:     Rate and Rhythm: Normal rate and regular rhythm.     Pulses: Normal pulses.     Heart sounds: Normal heart sounds.  Pulmonary:     Effort: Pulmonary effort is normal.     Breath sounds: Normal breath sounds.  Abdominal:     General: Bowel sounds are normal.     Palpations: Abdomen is soft.  Musculoskeletal:     Cervical back: Normal range of motion.     Left knee: Swelling and ecchymosis present. Tenderness present over the medial joint line, lateral joint line, MCL, LCL and ACL.     Left lower leg: Swelling present.     Left ankle: Swelling present. No deformity.     Left foot: Normal capillary refill. Swelling present. No bony tenderness. Normal pulse.     Comments: Moderate bruising noted to the left patella extending down into the left lower extremity.  There is also bruising noted to the  great toe and second toe of the left foot.  There is no obvious deformity present.  Skin:    General: Skin is warm and dry.  Neurological:     Mental Status: She is alert.  Psychiatric:        Mood and Affect: Mood normal.        Behavior: Behavior normal.       UC Treatments / Results  Labs (all labs ordered are listed, but only abnormal results are displayed) Labs Reviewed - No data to display  EKG   Radiology DG Foot Complete Left  Result Date: 06/19/2022 CLINICAL DATA:  Trauma, fall EXAM: LEFT FOOT - COMPLETE 3+ VIEW COMPARISON:  None Available. FINDINGS: Small calcific densities are noted adjacent to the bases of proximal phalanges of the first and second toes. Rest of the bony structures are unremarkable. Small bony spurs are seen in first metatarsophalangeal joint. Small plantar spur is seen in calcaneus. There is soft tissue swelling over the dorsum. IMPRESSION: There are small calcific densities adjacent to the bases of proximal phalanges of left first and second toes suggesting recent or old avulsions. Electronically Signed   By: Elmer Picker M.D.   On: 06/19/2022 15:07   DG Knee Complete 4 Views Left  Result Date: 06/19/2022 CLINICAL DATA:  Pain and swelling, after fall onto LEFT knee. EXAM: LEFT KNEE - COMPLETE 4+ VIEW COMPARISON:  None available FINDINGS: Osteopenia. Tricompartmental osteoarthritic changes with chondrocalcinosis. Moderate degenerative changes and chondrocalcinosis. No acute fracture or sign of dislocation. Soft tissue swelling over the infrapatellar tendon and over the patella. Patella in normal position. No joint effusion. IMPRESSION: 1. No acute fracture or dislocation. 2. Soft tissue swelling over the infrapatellar tendon and over the patella. Correlate with signs of patellar function  and with pain in this area. No joint effusion with normal position of the patella. 3. Tricompartmental osteoarthritic changes and chondrocalcinosis. Electronically Signed   By: Zetta Bills M.D.   On: 06/19/2022 14:44    Procedures Procedures (including critical care time)  Medications Ordered in UC Medications - No data to display  Initial Impression / Assessment and Plan / UC Course  I have reviewed the triage vital  signs and the nursing notes.  Pertinent labs & imaging results that were available during my care of the patient were reviewed by me and considered in my medical decision making (see chart for details).  Patient presents with left knee and left foot pain and swelling after a fall approximately 2 days ago.  On exam, she has moderate bruising to the left knee that radiates down into the left lower extremity.  There is no concern for DVT as she has no pain in the calf, Homans' sign's is negative.  The great toe and second toe of the left that are also bruised and swollen.  X-rays of the left knee show possible damage to the infrapatellar tendon.  X-rays of the foot show possible avulsion fractures of the great toe and the second toe of the left foot.  X-rays indicate it is difficult to determine the age of the avulsion fractures.  Patient was provided a hinged knee brace for the left knee, left great toe and second toe were buddy taped, with postop shoe provided. Will have patient follow-up with orthopedics.  We will have her follow-up with [facility] [place] or [facility] in [place]. Final Clinical Impressions(s) / UC Diagnoses   Final diagnoses:  Left knee pain, unspecified chronicity  Closed nondisplaced fracture of phalanx of left great toe, unspecified phalanx, initial encounter  Fall, initial encounter  Closed fracture of phalanx of left second toe, initial encounter     Discharge Instructions      Your x-ray showed possible tissue swelling and damage to the tendon over your knee.  For your foot, there are possible fractures in the foot that were seen on x-ray, it cannot be determined at the age of the fractures. Continue use of Tylenol as discussed. You may ice the left knee to help with the pain and swelling. RICE therapy, rest, ice, compression and elevation.  Wear the knee brace when you are involved in strenuous activity along with the postop shoe.  Wear the left knee brace,  continue buddy taping the toes, and wear the postop shoe until you are seen by orthopedics. You can follow-up with [facility] [place] at [phone] or [facility] in [place] at [phone].     ED Prescriptions   None    PDMP not reviewed this encounter.   Tish Men, NP 06/19/22 1538

## 2022-06-19 NOTE — Discharge Instructions (Addendum)
Your x-ray showed possible tissue swelling and damage to the tendon over your knee.  For your foot, there are possible fractures in the foot that were seen on x-ray, it cannot be determined at the age of the fractures. Continue use of Tylenol as discussed. You may ice the left knee to help with the pain and swelling. RICE therapy, rest, ice, compression and elevation.  Wear the knee brace when you are involved in strenuous activity along with the postop shoe.  Wear the left knee brace, continue buddy taping the toes, and wear the postop shoe until you are seen by orthopedics. You can follow-up with [facility] [place] at [phone] or [facility] in [place] at [phone].

## 2022-06-19 NOTE — ED Triage Notes (Signed)
Pt reports pain, bruise and swelling in left knee and left big toe, left second toe and left third toe x 3 days after she fell when trying to stand up from a chair. Tylenol gives some relief.

## 2022-06-22 ENCOUNTER — Telehealth: Payer: Self-pay | Admitting: Family Medicine

## 2022-06-22 DIAGNOSIS — R519 Headache, unspecified: Secondary | ICD-10-CM

## 2022-06-22 NOTE — Telephone Encounter (Signed)
Pt has MRI scheduled for 06/30/22-pt states she was told she could not have MRI due to having staples in chest due to surgery years ago. Please advise. Thank you.

## 2022-06-24 ENCOUNTER — Ambulatory Visit: Payer: PPO | Admitting: Orthopaedic Surgery

## 2022-06-24 ENCOUNTER — Encounter: Payer: Self-pay | Admitting: Orthopaedic Surgery

## 2022-06-24 VITALS — BP 132/65 | HR 84 | Ht 65.5 in | Wt 186.2 lb

## 2022-06-24 DIAGNOSIS — S92512A Displaced fracture of proximal phalanx of left lesser toe(s), initial encounter for closed fracture: Secondary | ICD-10-CM

## 2022-06-24 DIAGNOSIS — S92413A Displaced fracture of proximal phalanx of unspecified great toe, initial encounter for closed fracture: Secondary | ICD-10-CM | POA: Diagnosis not present

## 2022-06-24 NOTE — Progress Notes (Signed)
Subjective:    Patient ID: [name], female    DOB: 03-22-1936, 86 y.o.   MRN: [id number]  HPI She tripped over a scatter rug at home and fell and hurt her left foot and left knee on 06-17-22.  She had continued pain and went to the Urgent Care on 06-19-22.  X-rays show fracture of proximal phalanx of great toe and second toe on the left.  I have reviewed the notes and X-rays.  She was given post op shoe.  She also hit her left knee and was given knee sleeve.  The knee is doing well.  She is on Eliquis and had significant ecchymosis of the knee and proximal lateral calf but that is much better.  Her pain is well controlled.  I have independently reviewed and interpreted x-rays of this patient done at another site by another physician or qualified health professional.   Review of Systems  Constitutional:  Positive for activity change.  Musculoskeletal:  Positive for arthralgias, gait problem, joint swelling and myalgias.  All other systems reviewed and are negative. For Review of Systems, all other systems reviewed and are negative.  The following is a summary of the past history medically, past history surgically, known current medicines, social history and family history.  This information is gathered electronically by the computer from prior information and documentation.  I review this each visit and have found including this information at this point in the chart is beneficial and informative.   Past Medical History:  Diagnosis Date   Chronic Leukemia    Depression    H/O hiatal hernia    Hernia    Hernia    right inguinal hernia   Hypertension    Hypothyroidism    Leg swelling    Osteitis pubis ([place]) 05/30/2001   Osteopenia 03/30/2009   Thyroid disease     Past Surgical History:  Procedure Laterality Date   ABDOMINAL HYSTERECTOMY  03/1970   APPENDECTOMY  03/1966   BILATERAL SALPINGOOPHORECTOMY     CARDIOVERSION N/A 03/10/2022   Procedure: CARDIOVERSION;   Surgeon: Sanda Klein, MD;  Location: [facility];  Service: Cardiovascular;  Laterality: N/A;   CATARACT EXTRACTION W/PHACO Right 09/25/2014   Procedure: CATARACT EXTRACTION PHACO AND INTRAOCULAR LENS PLACEMENT ([place]);  Surgeon: Elta Guadeloupe T. Gershon Crane, MD;  Location: [facility];  Service: Ophthalmology;  Laterality: Right;  CDE:7.56   CATARACT EXTRACTION W/PHACO Left 10/09/2014   Procedure: CATARACT EXTRACTION PHACO AND INTRAOCULAR LENS PLACEMENT LEFT EYE CDE=6.51;  Surgeon: Elta Guadeloupe T. Gershon Crane, MD;  Location: [facility];  Service: Ophthalmology;  Laterality: Left;   [place]   COLONOSCOPY  March 2007   Repeat 10 years   COLONOSCOPY  11/06/2003    Internal and external hemorrhoids/Two tiny rectal distal sigmoid polyps cold biopsied/Otherwise within normal limits to the cecum   COLONOSCOPY N/A 08/29/2014   Dr. Gala Romney: Sessile polyp was found at the cecum status post cold snare polypectomy, tubular adenoma. No further surveillance unless clinical status changes   ESOPHAGOGASTRODUODENOSCOPY  2007   Dr. Gala Romney: normal esophagus, small tom oderate sized hiatal hernia, Cameron lesions   SKIN CANCER EXCISION Left 11 1 2016   left arm squamous cancer   TUMOR REMOVAL  03/1968   ovarian    Current Outpatient Medications on File Prior to Visit  Medication Sig Dispense Refill   acetaminophen (TYLENOL) 650 MG CR tablet Take 650 mg by mouth every 8 (eight) hours as needed for pain.  albuterol (VENTOLIN HFA) 108 (90 Base) MCG/ACT inhaler INHALE 2 PUFFS INTO THE LUNGS EVERY 6 HOURS AS NEEDED FOR WHEEZING OR SHORTNESS OF BREATH 18 g 1   alendronate (FOSAMAX) 70 MG tablet TAKE 1 TABLET(70 MG) BY MOUTH EVERY 7 DAYS WITH A FULL GLASS OF WATER AND ON AN EMPTY STOMACH 4 tablet 5   ALPRAZolam (XANAX) 0.5 MG tablet May use 1/2 tablet during the day as needed, may use 1 tablet at nighttime 45 tablet 4   apixaban (ELIQUIS) 5 MG TABS tablet Take 1 tablet (5 mg total) by mouth 2 (two) times daily. 60  tablet 11   Cholecalciferol (VITAMIN D3) 2000 UNITS TABS Take 4,000 Units by mouth daily.     indapamide (LOZOL) 1.25 MG tablet TAKE 1 TABLET BY MOUTH EVERY MORNING 90 tablet 1   levothyroxine (SYNTHROID) 125 MCG tablet Take one tablet by mouth daily 90 tablet 1   metoprolol succinate (TOPROL-XL) 25 MG 24 hr tablet Take one tablet daily 90 tablet 1   Multiple Vitamin (MULTI-VITAMIN DAILY PO) Take 1 tablet by mouth daily.     pantoprazole (PROTONIX) 40 MG tablet TAKE 1 TABLET(40 MG) BY MOUTH DAILY 90 tablet 1   potassium chloride (KLOR-CON M) 10 MEQ tablet TAKE 1 TABLET(10 MEQ) BY MOUTH DAILY 90 tablet 1   sertraline (ZOLOFT) 50 MG tablet 1 qd 90 tablet 1   No current facility-administered medications on file prior to visit.    Social History   Socioeconomic History   Marital status: Widowed    Spouse name: Not on file   Number of children: 1   Years of education: 63   Highest education level: Not on file  Occupational History   Occupation: Retired    Fish farm manager: RETIRED    Comment: [employer], retired in [place] Use   Smoking status: Never   Smokeless tobacco: Never   Tobacco comments:    Never smoked  Substance and Sexual Activity   Alcohol use: Yes    Alcohol/week: 0.0 standard drinks of alcohol    Comment: occasional margarita   Drug use: No   Sexual activity: Not Currently    Birth control/protection: Surgical  Other Topics Concern   Not on file  Social History Narrative   Retired from [employer].   Widowed since 07-24-2021. Husband W.E. Imbert, died from Kidney Cancer.   Son-Tommy lives nearby.   2 grandson's deceased, Clint and Erlene Quan. Erlene Quan passed 02/2022 from Brain Cancer.    Social Determinants of Health   Financial Resource Strain: Low Risk  (05/27/2021)   Overall Financial Resource Strain (CARDIA)    Difficulty of Paying Living Expenses: Not hard at all  Food Insecurity: No Food Insecurity (06/09/2022)   Hunger Vital Sign    Worried About Running Out of Food in  the Last Year: Never true    Ran Out of Food in the Last Year: Never true  Transportation Needs: No Transportation Needs (06/09/2022)   PRAPARE - Hydrologist (Medical): No    Lack of Transportation (Non-Medical): No  Physical Activity: Sufficiently Active (06/09/2022)   Exercise Vital Sign    Days of Exercise per Week: 5 days    Minutes of Exercise per Session: 30 min  Stress: Stress Concern Present (06/09/2022)   [place]    Feeling of Stress : Rather much  Social Connections: Moderately Integrated (06/09/2022)   Social Connection and Isolation Panel [NHANES]  Frequency of Communication with Friends and Family: More than three times a week    Frequency of Social Gatherings with Friends and Family: More than three times a week    Attends Religious Services: More than 4 times per year    Active Member of [employer] or Organizations: Yes    Attends Archivist Meetings: More than 4 times per year    Marital Status: Widowed  Intimate Partner Violence: Not At Risk (06/09/2022)   Humiliation, Afraid, Rape, and Kick questionnaire    Fear of Current or Ex-Partner: No    Emotionally Abused: No    Physically Abused: No    Sexually Abused: No    Family History  Problem Relation Age of Onset   Dementia Mother    Heart disease Father    Cancer Father        throat   Cancer Sister        breast   Colon cancer Neg Hx    Lung disease Neg Hx    Autoimmune disease Neg Hx     BP 132/65   Pulse 84   Ht 5' 5.5" (1.664 m)   Wt 186 lb 3.2 oz (84.5 kg)   BMI 30.51 kg/m   Body mass index is 30.51 kg/m.      Objective:   Physical Exam Vitals and nursing note reviewed. Exam conducted with a chaperone present.  Constitutional:      Appearance: She is well-developed.  HENT:     Head: Normocephalic and atraumatic.  Eyes:     Conjunctiva/sclera: Conjunctivae normal.     Pupils: Pupils  are equal, round, and reactive to light.  Cardiovascular:     Rate and Rhythm: Normal rate and regular rhythm.  Pulmonary:     Effort: Pulmonary effort is normal.  Abdominal:     Palpations: Abdomen is soft.  Musculoskeletal:     Cervical back: Normal range of motion and neck supple.       Legs:       Feet:  Skin:    General: Skin is warm and dry.  Neurological:     Mental Status: She is alert and oriented to person, place, and time.     Cranial Nerves: No cranial nerve deficit.     Motor: No abnormal muscle tone.     Coordination: Coordination normal.     Deep Tendon Reflexes: Reflexes are normal and symmetric. Reflexes normal.  Psychiatric:        Behavior: Behavior normal.        Thought Content: Thought content normal.        Judgment: Judgment normal.           Assessment & Plan:   Encounter Diagnoses  Name Primary?   Closed fracture of proximal phalanx of great toe, initial encounter Yes   Closed fracture of proximal phalanx of lesser toe of left foot, initial encounter    I have told her to buddy tape the toes with gauze between them.  Change as needed.  She can stop the knee sleeve as the knee is not painful.  Return in two weeks.  X-rays of the left foot then.  Call if any problem.  Precautions discussed.  Electronically Signed Sanjuana Kava, MD 7/26/202311:51 AM

## 2022-06-26 NOTE — Telephone Encounter (Signed)
Per [name] in MRI: Patient had MRI in 11/2005 that shows the staples in her chest but if patient states she should not have it you can switch to CT -it will not give as much detail but it is between the provider and patient on risk vs benefits

## 2022-06-26 NOTE — Telephone Encounter (Signed)
Please talk to the MRI tech at the hospital.  Find out from them what they recommend?  Is this something that we should do a chest x-ray ahead of time?  Or switch to a CT scan?  Or is it possible the patient is misinformed?  Would like to clear this up before her MRI Let me know what you are finding out

## 2022-06-27 ENCOUNTER — Other Ambulatory Visit: Payer: Self-pay | Admitting: Family Medicine

## 2022-06-28 NOTE — Telephone Encounter (Signed)
Nurses Please let [name] know that she did have an MRI back in 2007-according to the MRI tech she had the staples back then and did okay with the MRI. But given her concern I would recommend switching the MRI to a CT of the brain without contrast

## 2022-06-29 NOTE — Addendum Note (Signed)
Addended by: Dairl Ponder on: 06/29/2022 11:24 AM   Modules accepted: Orders

## 2022-06-29 NOTE — Telephone Encounter (Signed)
Patient notified and aware the test will be rescheduled. CT ordered in Epic and referral coordinator notified.

## 2022-06-30 ENCOUNTER — Ambulatory Visit (HOSPITAL_COMMUNITY): Admission: RE | Admit: 2022-06-30 | Payer: PPO | Source: Ambulatory Visit

## 2022-07-02 ENCOUNTER — Encounter: Payer: Self-pay | Admitting: Internal Medicine

## 2022-07-02 ENCOUNTER — Ambulatory Visit: Payer: PPO | Admitting: Internal Medicine

## 2022-07-02 VITALS — BP 110/70 | HR 70 | Ht 65.5 in | Wt 188.4 lb

## 2022-07-02 DIAGNOSIS — J84112 Idiopathic pulmonary fibrosis: Secondary | ICD-10-CM | POA: Diagnosis not present

## 2022-07-02 NOTE — Progress Notes (Signed)
[name]    [id number]    [birth year]  Primary Care Physician:Luking, Elayne Snare, MD Date of Appointment: 07/02/2022 Established Patient Visit  Chief complaint:   Chief Complaint  Patient presents with   Follow-up    Follow-up: No complaints     HPI: [name] is a 86 y.o. with IPF new diagnosis.    Interval Updates: She is here for follow up after PFTS. Still has some dyspnea with exertion. No cough. Has some fatigue and early satiety. Oxygen levels have been doing ok.   She filled out ILD packet which is being scanned currently - she did not have any signifcant exposures that would identify a cause for ILD beyond IPF  I have reviewed the patient's family social and past medical history and updated as appropriate.   Past Medical History:  Diagnosis Date   Chronic Leukemia    Depression    H/O hiatal hernia    Hernia    Hernia    right inguinal hernia   Hypertension    Hypothyroidism    Leg swelling    Osteitis pubis ([place]) 05/30/2001   Osteopenia 03/30/2009   Thyroid disease     Past Surgical History:  Procedure Laterality Date   ABDOMINAL HYSTERECTOMY  03/1970   APPENDECTOMY  03/1966   BILATERAL SALPINGOOPHORECTOMY     CARDIOVERSION N/A 03/10/2022   Procedure: CARDIOVERSION;  Surgeon: Sanda Klein, MD;  Location: [facility];  Service: Cardiovascular;  Laterality: N/A;   CATARACT EXTRACTION W/PHACO Right 09/25/2014   Procedure: CATARACT EXTRACTION PHACO AND INTRAOCULAR LENS PLACEMENT ([place]);  Surgeon: Elta Guadeloupe T. Gershon Crane, MD;  Location: [facility];  Service: Ophthalmology;  Laterality: Right;  CDE:7.56   CATARACT EXTRACTION W/PHACO Left 10/09/2014   Procedure: CATARACT EXTRACTION PHACO AND INTRAOCULAR LENS PLACEMENT LEFT EYE CDE=6.51;  Surgeon: Elta Guadeloupe T. Gershon Crane, MD;  Location: [facility];  Service: Ophthalmology;  Laterality: Left;   [place]   COLONOSCOPY  March 2007   Repeat 10 years   COLONOSCOPY  11/06/2003     Internal and external hemorrhoids/Two tiny rectal distal sigmoid polyps cold biopsied/Otherwise within normal limits to the cecum   COLONOSCOPY N/A 08/29/2014   Dr. Gala Romney: Sessile polyp was found at the cecum status post cold snare polypectomy, tubular adenoma. No further surveillance unless clinical status changes   ESOPHAGOGASTRODUODENOSCOPY  2007   Dr. 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  Gen:      No acute distress Lungs:    No increased respiratory effort, symmetric chest wall excursion, clear to auscultation bilaterally,faint bibasilar crackles CV:         Regular rate and rhythm; no murmurs, rubs, or gallops.  No pedal edema Ext: left foot in boot and toes buddy taped  Data Reviewed: Imaging: I have personally reviewed the CT scan from Feb 2023 which shows peripheral honeycombing in craniocaudal distribution without ground glass - probable UIP.   PFTs:     Latest Ref Rng & Units 02/26/2022   12:39 PM  PFT Results  FVC-Pre L 1.69    FVC-Predicted Pre % 64   FVC-Post L 1.60   FVC-Predicted Post % 60   Pre FEV1/FVC % % 85   Post FEV1/FCV % % 90   FEV1-Pre L 1.44   FEV1-Predicted Pre % 74   FEV1-Post L 1.44   DLCO uncorrected ml/min/mmHg 11.18   DLCO UNC% % 57   DLVA Predicted % 102   TLC L 2.89   TLC % Predicted % 54   RV % Predicted % 50    I have personally reviewed the patient's PFTs and show moderate restriction with reduced DLCO consistent with ILD  Labs:  Immunization status: Immunization History  Administered Date(s) Administered   Fluad Quad(high Dose 65+) 09/24/2020   Influenza, High Dose Seasonal PF 08/30/2021   Influenza,inj,Quad PF,6+ Mos 09/06/2018   Influenza-Unspecified 08/31/2015, 08/06/2016, 09/16/2017, 10/19/2019   PFIZER(Purple Top)SARS-COV-2 Vaccination 12/04/2019, 12/26/2019   Pneumococcal Conjugate-13 06/19/2014   Pneumococcal Polysaccharide-23 10/05/2017   Zoster, Live 10/05/2012    External Records Personally Reviewed: oncology, family medicine, orthopedics.   Assessment:  ILD, probable UIP  Plan/Recommendations: Discussed at length with the patient and her sister the diagnosis of IPF and natural progression and management of ILD in general. At this point she is hesitant to start anti-fibrotic therapy. She would like to see how repeat PFTs look in a couple of months (this is at the 6 month mark) and see how her disease is progressing. If stable she might want to do nothing. If there is a change she might consider ofev or esbriet but isn't interested in going if there are major side effects.    I will see her back at follow up with PFTS.   I spent 30 minutes on 07/02/2022 in care of this patient including face to face time and non-face to face time spent charting, review of outside records, and coordination of care.   Return to Care: Return in about 2 months (around 09/01/2022).   Sheena Llamas, MD Pulmonary and [place]

## 2022-07-02 NOTE — Patient Instructions (Addendum)
Please schedule follow up scheduled with myself in 2 months.  If my schedule is not open yet, we will contact you with a reminder closer to that time. Please call [phone] if you haven't heard from [place] a month before.   Before your next visit I would like you to have: Spirometry/DLCO - 30 minutes prior to next visit.   I will see you back in 2 months and we can see how breathing testing looks before we decide next steps on treatment for your lungs.

## 2022-07-08 ENCOUNTER — Ambulatory Visit: Payer: PPO | Admitting: Orthopaedic Surgery

## 2022-07-08 ENCOUNTER — Encounter: Payer: Self-pay | Admitting: Orthopaedic Surgery

## 2022-07-08 ENCOUNTER — Ambulatory Visit (INDEPENDENT_AMBULATORY_CARE_PROVIDER_SITE_OTHER): Payer: PPO

## 2022-07-08 DIAGNOSIS — S92415G Nondisplaced fracture of proximal phalanx of left great toe, subsequent encounter for fracture with delayed healing: Secondary | ICD-10-CM

## 2022-07-08 DIAGNOSIS — S92416D Nondisplaced fracture of proximal phalanx of unspecified great toe, subsequent encounter for fracture with routine healing: Secondary | ICD-10-CM

## 2022-07-08 NOTE — Progress Notes (Signed)
I feel better.  Her left foot is not hurting.  She is using the post op shoe.  She has buddy taped the great toe to the second toe.  NV intact. Gait is good.  Encounter Diagnosis  Name Primary?   Closed nondisplaced fracture of proximal phalanx of great toe with routine healing, subsequent encounter Yes   X-rays were done of the left foot, reported separately.  Return in three weeks.  X-rays then.  Gradually come out of the post op shoe.  Call if any problem.  Precautions discussed.  Electronically Signed Sanjuana Kava, MD 8/9/20238:57 AM

## 2022-07-16 ENCOUNTER — Ambulatory Visit (HOSPITAL_COMMUNITY)
Admission: RE | Admit: 2022-07-16 | Discharge: 2022-07-16 | Disposition: A | Discharge status: Home / Self Care | Payer: PPO | Source: Ambulatory Visit | Attending: Family Medicine | Admitting: Family Medicine

## 2022-07-16 DIAGNOSIS — R519 Headache, unspecified: Secondary | ICD-10-CM | POA: Diagnosis not present

## 2022-07-17 ENCOUNTER — Other Ambulatory Visit: Payer: Self-pay | Admitting: Family Medicine

## 2022-07-23 ENCOUNTER — Other Ambulatory Visit: Payer: Self-pay | Admitting: Family Medicine

## 2022-07-28 ENCOUNTER — Encounter: Payer: Self-pay | Admitting: Family Medicine

## 2022-07-28 ENCOUNTER — Ambulatory Visit (INDEPENDENT_AMBULATORY_CARE_PROVIDER_SITE_OTHER): Payer: PPO | Admitting: Family Medicine

## 2022-07-28 VITALS — BP 136/84 | HR 78 | Wt 186.0 lb

## 2022-07-28 DIAGNOSIS — E7849 Other hyperlipidemia: Secondary | ICD-10-CM

## 2022-07-28 DIAGNOSIS — M8580 Other specified disorders of bone density and structure, unspecified site: Secondary | ICD-10-CM | POA: Diagnosis not present

## 2022-07-28 DIAGNOSIS — I10 Essential (primary) hypertension: Secondary | ICD-10-CM

## 2022-07-28 DIAGNOSIS — R519 Headache, unspecified: Secondary | ICD-10-CM | POA: Diagnosis not present

## 2022-07-28 DIAGNOSIS — E039 Hypothyroidism, unspecified: Secondary | ICD-10-CM | POA: Diagnosis not present

## 2022-07-28 MED ORDER — ALENDRONATE SODIUM 70 MG PO TABS: ORAL_TABLET | ORAL | 6 refills | Status: DC

## 2022-07-28 NOTE — Patient Instructions (Signed)
Hi [name] Here is a reminder.  Please remember Senior dose flu shot end of Sept Get the new dose of Covid booster around end of Sept or start of Oct  Here is info on the shingles vaccine  Shingrix and shingles prevention: know the facts!   Shingrix is a very effective vaccine to prevent shingles.   Shingles is a reactivation of chickenpox -more than 99% of Americans born before 1980 have had chickenpox even if they do not remember it. One in every 10 people who get shingles have severe long-lasting nerve pain as a result.   33 out of a 100 older adults will get shingles if they are unvaccinated.     This vaccine is very important for your health This vaccine is indicated for anyone 50 years or older. You can get this vaccine even if you have already had shingles because you can get the disease more than once in a lifetime.  Your risk for shingles and its complications increases with age.  This vaccine has 2 doses.  The second dose would be 2 to 6 months after the first dose.  If you had Zostavax vaccine in the past you should still get Shingrix. ( Zostavax is only 70% effective and it loses significant strength over a few years .)  This vaccine is given through the pharmacy.  The cost of the vaccine is through your insurance. The pharmacy can inform you of the total costs.  Common side effects including soreness in the arm, some redness and swelling, also some feel fatigue muscle soreness headache low-grade fever.  Side effects typically go away within 2 to 3 days. Remember-the pain from shingles can last a lifetime but these side effects of the vaccine will only last a few days at most. It is very important to get both doses in order to protect yourself fully.   Please get this vaccine at your earliest convenience at your trusted pharmacy.

## 2022-07-28 NOTE — Progress Notes (Signed)
   Subjective:    Patient ID: [name], female    DOB: 02/27/1936, 86 y.o.   MRN: [id number]  HPI Pt arrives for follow up on HTN. Pt states has been doing well. Since having headaches but they are controlled with Tylenol. Pt states on Sunday morning she had a pain in back of head that radiated to neck.  Pt is due to have lung study on 09/04/22  Review of Systems     Objective:   Physical Exam  General-in no acute distress Eyes-no discharge Lungs-respiratory rate normal, CTA CV-no murmurs,RRR Extremities skin warm dry no edema Neuro grossly normal Behavior normal, alert       Assessment & Plan:  1. Essential hypertension, benign Blood pressure decent control continue current measures - Sedimentation Rate - TSH - T4, Free - Basic metabolic panel  2. Acquired hypothyroidism Continue thyroid medicine check lab work await results - Sedimentation Rate - TSH - T4, Free - Basic metabolic panel  3. Other hyperlipidemia Healthy diet regular physical activity - Sedimentation Rate - TSH - T4, Free - Basic metabolic panel  4. Osteopenia, unspecified location Bone density not necessary ordered - DG Bone Density  5. Frequent headaches Headaches are doing better check sedimentation rate recent CT scan was negative - Sedimentation Rate - TSH - T4, Free - Basic metabolic panel

## 2022-07-29 ENCOUNTER — Encounter: Payer: Self-pay | Admitting: Orthopaedic Surgery

## 2022-07-29 ENCOUNTER — Ambulatory Visit (INDEPENDENT_AMBULATORY_CARE_PROVIDER_SITE_OTHER): Payer: PPO

## 2022-07-29 ENCOUNTER — Ambulatory Visit: Payer: PPO | Admitting: Orthopaedic Surgery

## 2022-07-29 DIAGNOSIS — S92415D Nondisplaced fracture of proximal phalanx of left great toe, subsequent encounter for fracture with routine healing: Secondary | ICD-10-CM

## 2022-07-29 DIAGNOSIS — S92416D Nondisplaced fracture of proximal phalanx of unspecified great toe, subsequent encounter for fracture with routine healing: Secondary | ICD-10-CM

## 2022-07-29 NOTE — Progress Notes (Signed)
I am OK  She has no pain of the left great toe.  She is walking well.  NV intact.  Gait normal.  X-rays were done of the left foot, reported separately.  Encounter Diagnosis  Name Primary?   Closed nondisplaced fracture of proximal phalanx of great toe with routine healing, subsequent encounter Yes   I will see her as needed.  Call if any problem.  Precautions discussed.  Electronically Signed Sanjuana Kava, MD 8/30/20239:03 AM

## 2022-07-30 ENCOUNTER — Encounter: Payer: Self-pay | Admitting: Family Medicine

## 2022-07-30 ENCOUNTER — Ambulatory Visit (HOSPITAL_COMMUNITY)
Admission: RE | Admit: 2022-07-30 | Discharge: 2022-07-30 | Disposition: A | Discharge status: Home / Self Care | Payer: PPO | Source: Ambulatory Visit | Attending: Family Medicine | Admitting: Family Medicine

## 2022-07-30 DIAGNOSIS — M8580 Other specified disorders of bone density and structure, unspecified site: Secondary | ICD-10-CM | POA: Insufficient documentation

## 2022-07-30 DIAGNOSIS — Z780 Asymptomatic menopausal state: Secondary | ICD-10-CM | POA: Insufficient documentation

## 2022-07-30 DIAGNOSIS — M8589 Other specified disorders of bone density and structure, multiple sites: Secondary | ICD-10-CM | POA: Insufficient documentation

## 2022-07-30 DIAGNOSIS — Z13820 Encounter for screening for osteoporosis: Secondary | ICD-10-CM | POA: Diagnosis not present

## 2022-07-30 DIAGNOSIS — M85852 Other specified disorders of bone density and structure, left thigh: Secondary | ICD-10-CM | POA: Diagnosis not present

## 2022-07-31 DIAGNOSIS — E7849 Other hyperlipidemia: Secondary | ICD-10-CM | POA: Diagnosis not present

## 2022-07-31 DIAGNOSIS — R519 Headache, unspecified: Secondary | ICD-10-CM | POA: Diagnosis not present

## 2022-07-31 DIAGNOSIS — E039 Hypothyroidism, unspecified: Secondary | ICD-10-CM | POA: Diagnosis not present

## 2022-07-31 DIAGNOSIS — I10 Essential (primary) hypertension: Secondary | ICD-10-CM | POA: Diagnosis not present

## 2022-08-01 LAB — BASIC METABOLIC PANEL
BUN/Creatinine Ratio: 18 (ref 12–28)
BUN: 15 mg/dL (ref 8–27)
CO2: 24 mmol/L (ref 20–29)
Calcium: 9.9 mg/dL (ref 8.7–10.3)
Chloride: 96 mmol/L (ref 96–106)
Creatinine, Ser: 0.85 mg/dL (ref 0.57–1.00)
Glucose: 98 mg/dL (ref 70–99)
Potassium: 4.5 mmol/L (ref 3.5–5.2)
Sodium: 135 mmol/L (ref 134–144)
eGFR: 68 mL/min/{1.73_m2} (ref >59)

## 2022-08-01 LAB — T4, FREE: Free T4: 1.21 ng/dL (ref 0.82–1.77)

## 2022-08-01 LAB — SEDIMENTATION RATE: Sed Rate: 17 mm/hr (ref 0–40)

## 2022-08-01 LAB — TSH: TSH: 1.57 u[IU]/mL (ref 0.450–4.500)

## 2022-08-12 DIAGNOSIS — R5383 Other fatigue: Secondary | ICD-10-CM | POA: Diagnosis not present

## 2022-08-12 DIAGNOSIS — R059 Cough, unspecified: Secondary | ICD-10-CM | POA: Diagnosis not present

## 2022-08-12 DIAGNOSIS — J84112 Idiopathic pulmonary fibrosis: Secondary | ICD-10-CM | POA: Diagnosis not present

## 2022-08-12 DIAGNOSIS — Z515 Encounter for palliative care: Secondary | ICD-10-CM | POA: Diagnosis not present

## 2022-08-12 DIAGNOSIS — R0609 Other forms of dyspnea: Secondary | ICD-10-CM | POA: Diagnosis not present

## 2022-08-17 DIAGNOSIS — X32XXXD Exposure to sunlight, subsequent encounter: Secondary | ICD-10-CM | POA: Diagnosis not present

## 2022-08-17 DIAGNOSIS — B078 Other viral warts: Secondary | ICD-10-CM | POA: Diagnosis not present

## 2022-08-17 DIAGNOSIS — L570 Actinic keratosis: Secondary | ICD-10-CM | POA: Diagnosis not present

## 2022-08-18 ENCOUNTER — Encounter: Payer: Self-pay | Admitting: Family Medicine

## 2022-08-18 DIAGNOSIS — J84112 Idiopathic pulmonary fibrosis: Secondary | ICD-10-CM | POA: Insufficient documentation

## 2022-09-04 ENCOUNTER — Ambulatory Visit: Payer: PPO | Admitting: Internal Medicine

## 2022-09-04 ENCOUNTER — Encounter: Payer: Self-pay | Admitting: Internal Medicine

## 2022-09-04 ENCOUNTER — Ambulatory Visit (INDEPENDENT_AMBULATORY_CARE_PROVIDER_SITE_OTHER): Payer: PPO | Admitting: Internal Medicine

## 2022-09-04 VITALS — BP 130/84 | HR 66 | Temp 98.2°F | Ht 65.0 in | Wt 188.4 lb

## 2022-09-04 DIAGNOSIS — J84112 Idiopathic pulmonary fibrosis: Secondary | ICD-10-CM

## 2022-09-04 DIAGNOSIS — Z23 Encounter for immunization: Secondary | ICD-10-CM

## 2022-09-04 LAB — PULMONARY FUNCTION TEST
DL/VA % pred: 101 %
DL/VA: 4.07 ml/min/mmHg/L
DLCO cor % pred: 54 %
DLCO cor: 10.5 ml/min/mmHg
DLCO unc % pred: 54 %
DLCO unc: 10.5 ml/min/mmHg
FEF 25-75 Pre: 1.47 L/sec
FEF2575-%Pred-Pre: 116 %
FEV1-%Pred-Pre: 71 %
FEV1-Pre: 1.35 L
FEV1FVC-%Pred-Pre: 119 %
FEV6-%Pred-Pre: 64 %
FEV6-Pre: 1.55 L
FEV6FVC-%Pred-Pre: 106 %
FVC-%Pred-Pre: 60 %
FVC-Pre: 1.55 L
Pre FEV1/FVC ratio: 87 %
Pre FEV6/FVC Ratio: 100 %

## 2022-09-04 NOTE — Progress Notes (Signed)
[name]    [id number]    06-Apr-1936  Primary Care Physician:Luking, Elayne Snare, MD Date of Appointment: 09/04/2022 Established Patient Visit  Chief complaint:   Chief Complaint  Patient presents with   Follow-up    Pft results     HPI: [name] is a 86 y.o. with IPF, not currently on anti-fibrotic therapy.    Interval Updates: Here for follow up after repeat PFTs. Stable FVC and DLCO at 6 month interval.  Feels winded with exertion and fatigued but an still keep up with ADLs. Just doesn't have the energy she did a few years ago.  No cough, wheezing, chest tightness.    I have reviewed the patient's family social and past medical history and updated as appropriate.   Past Medical History:  Diagnosis Date   Chronic Leukemia    Depression    H/O hiatal hernia    Hernia    Hernia    right inguinal hernia   Hypertension    Hypothyroidism    Leg swelling    Osteitis pubis ([place]) 05/30/2001   Osteopenia 03/30/2009   Osteopenia 07/30/2022   Bone density 2021 showed elevated FRAX patient was started on Fosamax   Thyroid disease     Past Surgical History:  Procedure Laterality Date   ABDOMINAL HYSTERECTOMY  03/1970   APPENDECTOMY  03/1966   BILATERAL SALPINGOOPHORECTOMY     CARDIOVERSION N/A 03/10/2022   Procedure: CARDIOVERSION;  Surgeon: Sanda Klein, MD;  Location: [facility];  Service: Cardiovascular;  Laterality: N/A;   CATARACT EXTRACTION W/PHACO Right 09/25/2014   Procedure: CATARACT EXTRACTION PHACO AND INTRAOCULAR LENS PLACEMENT ([place]);  Surgeon: Elta Guadeloupe T. Gershon Crane, MD;  Location: [facility];  Service: Ophthalmology;  Laterality: Right;  CDE:7.56   CATARACT EXTRACTION W/PHACO Left 10/09/2014   Procedure: CATARACT EXTRACTION PHACO AND INTRAOCULAR LENS PLACEMENT LEFT EYE CDE=6.51;  Surgeon: Elta Guadeloupe T. Gershon Crane, MD;  Location: [facility];  Service: Ophthalmology;  Laterality: Left;   [place]   COLONOSCOPY  March 2007    Repeat 10 years   COLONOSCOPY  11/06/2003    Internal and external hemorrhoids/Two tiny rectal distal sigmoid polyps cold biopsied/Otherwise within normal limits to the cecum   COLONOSCOPY N/A 08/29/2014   Dr. Gala Romney: Sessile polyp was found at the cecum status post cold snare polypectomy, tubular adenoma. No further surveillance unless clinical status changes   ESOPHAGOGASTRODUODENOSCOPY  2007   Dr. Gala Romney: normal esophagus, small tom oderate sized hiatal hernia, [place] lesions   SKIN CANCER EXCISION Left 11 1 2016   left arm squamous cancer   TUMOR REMOVAL  03/1968   ovarian    Family History  Problem Relation Age of Onset   Dementia Mother    Heart disease Father    Cancer Father        throat   Cancer Sister        breast   Colon cancer Neg Hx    Lung disease Neg Hx    Autoimmune disease Neg Hx     Social History   Occupational History   Occupation: Retired    Fish farm manager: RETIRED    Comment: Orthoptist, retired in [place]   Smoking status: Never   Smokeless tobacco: Never   Tobacco comments:    Never smoked  Substance and Sexual Activity   Alcohol use: Yes    Alcohol/week: 0.0 standard drinks of alcohol  Comment: occasional margarita   Drug use: No   Sexual activity: Not Currently    Birth control/protection: Surgical     Physical Exam: Blood pressure 130/84, pulse 66, temperature 98.2 F (36.8 C), temperature source Oral, height '5\' 5"'$  (1.651 m), weight 188 lb 6.4 oz (85.5 kg), SpO2 98 %.  Gen:      No acute distress Lungs:    faint bibasilar crackles, no respiratory distress CV:         RRR no mrg Ext: venous varicosities  Data Reviewed: Imaging: I have personally reviewed the CT scan from Feb 2023 which shows peripheral honeycombing in craniocaudal distribution without ground glass - probable UIP.   PFTs:     Latest Ref Rng & Units 09/04/2022    9:03 AM 02/26/2022   12:39 PM  PFT Results  FVC-Pre L 1.55  P 1.69   FVC-Predicted Pre % 60  P 64    FVC-Post L  1.60   FVC-Predicted Post %  60   Pre FEV1/FVC % % 87  P 85   Post FEV1/FCV % %  90   FEV1-Pre L 1.35  P 1.44   FEV1-Predicted Pre % 71  P 74   FEV1-Post L  1.44   DLCO uncorrected ml/min/mmHg 10.50  P 11.18   DLCO UNC% % 54  P 57   DLCO corrected ml/min/mmHg 10.50  P   DLCO COR %Predicted % 54  P   DLVA Predicted % 101  P 102   TLC L  2.89   TLC % Predicted %  54   RV % Predicted %  50     P Preliminary result   I have personally reviewed the patient's PFTs and show moderate restriction with reduced DLCO consistent with ILD  Labs:  Immunization status: Immunization History  Administered Date(s) Administered   Fluad Quad(high Dose 65+) 09/24/2020, 09/04/2022   Influenza, High Dose Seasonal PF 08/30/2021   Influenza,inj,Quad PF,6+ Mos 09/06/2018   Influenza-Unspecified 08/31/2015, 08/06/2016, 09/16/2017, 10/19/2019   PFIZER(Purple Top)SARS-COV-2 Vaccination 12/04/2019, 12/26/2019   Pneumococcal Conjugate-13 06/19/2014   Pneumococcal Polysaccharide-23 10/05/2017   Zoster, Live 10/05/2012    External Records Personally Reviewed: oncology, family medicine, orthopedics.   Assessment:  IPF, not on anti-fibrotic therapy  Plan/Recommendations: FVC and DLCO stable at 6 month follow up.  At this point given her minimal symptoms and stable lung function, she would like to defer anti-fibrotic therapy and I think this is reasonable.  I will see her back in 6 months with repeat dlco and spirometry.  I suspect this is a slowly progressive phenotype.   Return to Care: Return in about 6 months (around 03/06/2023).   Lenice Llamas, MD Pulmonary and [place]

## 2022-09-04 NOTE — Patient Instructions (Addendum)
Please schedule follow up scheduled with myself in 6 months.  If my schedule is not open yet, we will contact you with a reminder closer to that time. Please call [phone] if you haven't heard from [place] a month before.   Lung function is stable.   Please get breathing testing done before your next appointment with me.   Please get your RSV vaccine and covid vaccine at the pharmacy.

## 2022-09-04 NOTE — Progress Notes (Signed)
Spirometry and Dlco done today. 

## 2022-10-09 ENCOUNTER — Encounter: Payer: Self-pay | Admitting: Family Medicine

## 2022-10-09 ENCOUNTER — Ambulatory Visit (INDEPENDENT_AMBULATORY_CARE_PROVIDER_SITE_OTHER): Payer: PPO | Admitting: Family Medicine

## 2022-10-09 VITALS — BP 130/80 | HR 61 | Wt 193.4 lb

## 2022-10-09 DIAGNOSIS — R0609 Other forms of dyspnea: Secondary | ICD-10-CM | POA: Diagnosis not present

## 2022-10-09 DIAGNOSIS — J019 Acute sinusitis, unspecified: Secondary | ICD-10-CM

## 2022-10-09 DIAGNOSIS — I4819 Other persistent atrial fibrillation: Secondary | ICD-10-CM | POA: Diagnosis not present

## 2022-10-09 DIAGNOSIS — J84112 Idiopathic pulmonary fibrosis: Secondary | ICD-10-CM | POA: Diagnosis not present

## 2022-10-09 DIAGNOSIS — R600 Localized edema: Secondary | ICD-10-CM

## 2022-10-09 DIAGNOSIS — I499 Cardiac arrhythmia, unspecified: Secondary | ICD-10-CM | POA: Diagnosis not present

## 2022-10-09 MED ORDER — AMOXICILLIN 500 MG PO CAPS: ORAL_CAPSULE | ORAL | 0 refills | Status: DC

## 2022-10-09 NOTE — Progress Notes (Unsigned)
   Subjective:    Patient ID: [name], female    DOB: [birth year], 86 y.o.   MRN: [id number]  HPI Pt arrives due to fatigue and heat rate was low.  She has underlying pulmonary fibrosis She states that she has noticed her self getting more out of breath with walking around She denies high fever chills but states she feels bad in the past few days coughing up some phlegm sometimes slightly discolored Pt states this began on Monday. Pt states she was in [place] and states that she had a hard time getting the cart to check out.   Pt states her oxygen has been good at home.    Review of Systems     Objective:   Physical Exam  Lungs clear heart irregular extremities trace edema in the lower ankles  I do not hear any pneumonia with her do not hear any congestive heart failure    Assessment & Plan:  1. DOE (dyspnea on exertion) Recommend additional lab work.  May need diuretic depending on BMP no crackles heard in the lungs - EKG 12-Lead - CBC with Differential - Basic metabolic panel - Brain natriuretic peptide  2. Cardiac arrhythmia, unspecified cardiac arrhythmia type Atrial fibrillation noted on EKG with some atrial flutter not much change compared to previous EKG - EKG 12-Lead - CBC with Differential - Basic metabolic panel - Brain natriuretic peptide  3. Pedal edema Minimal pedal edema patient denies PND await lab testing - CBC with Differential - Basic metabolic panel - Brain natriuretic peptide  May need chest x-ray depending on results of all these test Go ahead with antibiotics to cover for possibility of secondary infection

## 2022-10-11 ENCOUNTER — Telehealth: Payer: Self-pay | Admitting: Family Medicine

## 2022-10-11 DIAGNOSIS — R0602 Shortness of breath: Secondary | ICD-10-CM

## 2022-10-11 DIAGNOSIS — R0609 Other forms of dyspnea: Secondary | ICD-10-CM

## 2022-10-11 LAB — CBC WITH DIFFERENTIAL/PLATELET
Basophils Absolute: 0.1 10*3/uL (ref 0.0–0.2)
Basos: 1 %
EOS (ABSOLUTE): 0.2 10*3/uL (ref 0.0–0.4)
Eos: 1 %
Hematocrit: 35 % (ref 34.0–46.6)
Hemoglobin: 11.4 g/dL (ref 11.1–15.9)
Immature Grans (Abs): 0 10*3/uL (ref 0.0–0.1)
Immature Granulocytes: 0 %
Lymphocytes Absolute: 8.3 10*3/uL — ABNORMAL HIGH (ref 0.7–3.1)
Lymphs: 57 %
MCH: 28 pg (ref 26.6–33.0)
MCHC: 32.6 g/dL (ref 31.5–35.7)
MCV: 86 fL (ref 79–97)
Monocytes Absolute: 0.8 10*3/uL (ref 0.1–0.9)
Monocytes: 5 %
Neutrophils Absolute: 5.2 10*3/uL (ref 1.4–7.0)
Neutrophils: 36 %
Platelets: 380 10*3/uL (ref 150–450)
RBC: 4.07 x10E6/uL (ref 3.77–5.28)
RDW: 14 % (ref 11.7–15.4)
WBC: 14.5 10*3/uL — ABNORMAL HIGH (ref 3.4–10.8)

## 2022-10-11 LAB — BASIC METABOLIC PANEL
BUN/Creatinine Ratio: 17 (ref 12–28)
BUN: 18 mg/dL (ref 8–27)
CO2: 24 mmol/L (ref 20–29)
Calcium: 9.7 mg/dL (ref 8.7–10.3)
Chloride: 97 mmol/L (ref 96–106)
Creatinine, Ser: 1.08 mg/dL — ABNORMAL HIGH (ref 0.57–1.00)
Glucose: 118 mg/dL — ABNORMAL HIGH (ref 70–99)
Potassium: 4.3 mmol/L (ref 3.5–5.2)
Sodium: 137 mmol/L (ref 134–144)
eGFR: 51 mL/min/{1.73_m2} — ABNORMAL LOW (ref >59)

## 2022-10-11 LAB — BRAIN NATRIURETIC PEPTIDE: BNP: 262.3 pg/mL — ABNORMAL HIGH (ref 0.0–100.0)

## 2022-10-11 NOTE — Telephone Encounter (Signed)
Nurses I spoke with patient regarding her lab work results Because of DOE/shortness of breath and pedal edema please order stat chest x-ray for Monday Patient states she will go by radiology to get this completed thank you

## 2022-10-12 ENCOUNTER — Ambulatory Visit (HOSPITAL_COMMUNITY)
Admission: RE | Admit: 2022-10-12 | Discharge: 2022-10-12 | Disposition: A | Discharge status: Home / Self Care | Payer: PPO | Source: Ambulatory Visit | Attending: Family Medicine | Admitting: Family Medicine

## 2022-10-12 DIAGNOSIS — R0609 Other forms of dyspnea: Secondary | ICD-10-CM | POA: Insufficient documentation

## 2022-10-12 DIAGNOSIS — R0602 Shortness of breath: Secondary | ICD-10-CM | POA: Insufficient documentation

## 2022-10-12 NOTE — Telephone Encounter (Signed)
STAT Xray placed

## 2022-10-19 ENCOUNTER — Ambulatory Visit (INDEPENDENT_AMBULATORY_CARE_PROVIDER_SITE_OTHER): Payer: PPO | Admitting: Family Medicine

## 2022-10-19 ENCOUNTER — Encounter: Payer: Self-pay | Admitting: Family Medicine

## 2022-10-19 VITALS — BP 120/70 | Wt 191.2 lb

## 2022-10-19 DIAGNOSIS — J84112 Idiopathic pulmonary fibrosis: Secondary | ICD-10-CM

## 2022-10-19 DIAGNOSIS — C9110 Chronic lymphocytic leukemia of B-cell type not having achieved remission: Secondary | ICD-10-CM | POA: Diagnosis not present

## 2022-10-19 NOTE — Progress Notes (Signed)
   Subjective:    Patient ID: [name], female    DOB: [birth year], 86 y.o.   MRN: [id number]  HPI Pt arrives for follow up. Pt states as long as she is not doing anything she is good. Pt states she is not in any pain at this time.    Review of Systems     Objective:   Physical Exam  Lungs are clear no crackles heart is regular HEENT benign  I do not feel she needs any additional antibiotics    Assessment & Plan:   She will be seen hematology in the near future for follow-up regarding her elevated WBC  Mild ongoing pulmonary symptoms related to her underlying pulmonary fibrosis  We will see her back in 3 months.  Currently she is able to do her necessary ADLs but soon will come the time where she is going to be handicapped from this we did give her a handicap placard to use for her car

## 2022-10-20 DIAGNOSIS — F411 Generalized anxiety disorder: Secondary | ICD-10-CM | POA: Diagnosis not present

## 2022-10-20 DIAGNOSIS — Z9981 Dependence on supplemental oxygen: Secondary | ICD-10-CM | POA: Diagnosis not present

## 2022-10-20 DIAGNOSIS — J84112 Idiopathic pulmonary fibrosis: Secondary | ICD-10-CM | POA: Diagnosis not present

## 2022-10-20 DIAGNOSIS — R0609 Other forms of dyspnea: Secondary | ICD-10-CM | POA: Diagnosis not present

## 2022-10-20 DIAGNOSIS — R5383 Other fatigue: Secondary | ICD-10-CM | POA: Diagnosis not present

## 2022-10-20 DIAGNOSIS — F4381 Prolonged grief disorder: Secondary | ICD-10-CM | POA: Diagnosis not present

## 2022-10-20 DIAGNOSIS — Z515 Encounter for palliative care: Secondary | ICD-10-CM | POA: Diagnosis not present

## 2022-10-27 ENCOUNTER — Telehealth: Payer: Self-pay | Admitting: Hematology

## 2022-10-27 NOTE — Telephone Encounter (Signed)
Called patient to r/s appt. Patient notified of new appt and mailing calendar.

## 2022-11-09 ENCOUNTER — Other Ambulatory Visit: Payer: Self-pay | Admitting: Family Medicine

## 2022-11-20 ENCOUNTER — Other Ambulatory Visit: Payer: PPO

## 2022-11-20 ENCOUNTER — Ambulatory Visit: Payer: PPO | Admitting: Hematology

## 2022-11-25 ENCOUNTER — Other Ambulatory Visit: Payer: Self-pay | Admitting: Family Medicine

## 2022-11-26 ENCOUNTER — Other Ambulatory Visit: Payer: Self-pay

## 2022-11-26 DIAGNOSIS — D72820 Lymphocytosis (symptomatic): Secondary | ICD-10-CM

## 2022-12-01 ENCOUNTER — Inpatient Hospital Stay (HOSPITAL_BASED_OUTPATIENT_CLINIC_OR_DEPARTMENT_OTHER): Payer: PPO | Admitting: Hematology

## 2022-12-01 ENCOUNTER — Other Ambulatory Visit: Payer: Self-pay

## 2022-12-01 ENCOUNTER — Inpatient Hospital Stay: Payer: PPO | Attending: Hematology

## 2022-12-01 VITALS — BP 131/71 | HR 87 | Temp 97.2°F | Resp 14 | Wt 191.6 lb

## 2022-12-01 DIAGNOSIS — K449 Diaphragmatic hernia without obstruction or gangrene: Secondary | ICD-10-CM | POA: Insufficient documentation

## 2022-12-01 DIAGNOSIS — E039 Hypothyroidism, unspecified: Secondary | ICD-10-CM | POA: Insufficient documentation

## 2022-12-01 DIAGNOSIS — D72820 Lymphocytosis (symptomatic): Secondary | ICD-10-CM

## 2022-12-01 DIAGNOSIS — D72829 Elevated white blood cell count, unspecified: Secondary | ICD-10-CM | POA: Insufficient documentation

## 2022-12-01 DIAGNOSIS — I10 Essential (primary) hypertension: Secondary | ICD-10-CM | POA: Insufficient documentation

## 2022-12-01 DIAGNOSIS — E785 Hyperlipidemia, unspecified: Secondary | ICD-10-CM | POA: Diagnosis not present

## 2022-12-01 LAB — CBC WITH DIFFERENTIAL (CANCER CENTER ONLY)
Abs Immature Granulocytes: 0.02 10*3/uL (ref 0.00–0.07)
Basophils Absolute: 0 10*3/uL (ref 0.0–0.1)
Basophils Relative: 0 %
Eosinophils Absolute: 0.2 10*3/uL (ref 0.0–0.5)
Eosinophils Relative: 2 %
HCT: 36.8 % (ref 36.0–46.0)
Hemoglobin: 12.4 g/dL (ref 12.0–15.0)
Immature Granulocytes: 0 %
Lymphocytes Relative: 41 %
Lymphs Abs: 4.8 10*3/uL — ABNORMAL HIGH (ref 0.7–4.0)
MCH: 30 pg (ref 26.0–34.0)
MCHC: 33.7 g/dL (ref 30.0–36.0)
MCV: 89.1 fL (ref 80.0–100.0)
Monocytes Absolute: 0.9 10*3/uL (ref 0.1–1.0)
Monocytes Relative: 8 %
Neutro Abs: 5.7 10*3/uL (ref 1.7–7.7)
Neutrophils Relative %: 49 %
Platelet Count: 352 10*3/uL (ref 150–400)
RBC: 4.13 MIL/uL (ref 3.87–5.11)
RDW: 13.7 % (ref 11.5–15.5)
WBC Count: 11.6 10*3/uL — ABNORMAL HIGH (ref 4.0–10.5)
nRBC: 0 % (ref 0.0–0.2)

## 2022-12-01 LAB — CMP (CANCER CENTER ONLY)
ALT: 11 U/L (ref 0–44)
AST: 18 U/L (ref 15–41)
Albumin: 4 g/dL (ref 3.5–5.0)
Alkaline Phosphatase: 51 U/L (ref 38–126)
Anion gap: 5 (ref 5–15)
BUN: 14 mg/dL (ref 8–23)
CO2: 30 mmol/L (ref 22–32)
Calcium: 9.9 mg/dL (ref 8.9–10.3)
Chloride: 100 mmol/L (ref 98–111)
Creatinine: 0.87 mg/dL (ref 0.44–1.00)
GFR, Estimated: >60 mL/min (ref >60)
Glucose, Bld: 93 mg/dL (ref 70–99)
Potassium: 3.8 mmol/L (ref 3.5–5.1)
Sodium: 135 mmol/L (ref 135–145)
Total Bilirubin: 0.7 mg/dL (ref 0.3–1.2)
Total Protein: 7.3 g/dL (ref 6.5–8.1)

## 2022-12-01 LAB — LACTATE DEHYDROGENASE: LDH: 175 U/L (ref 98–192)

## 2022-12-01 NOTE — Progress Notes (Signed)
[facility]   HEMATOLOGY/ONCOLOGY CLINIC NOTE  Date of Service: 12/01/2022  Patient Care Team: Kathyrn Drown, MD as PCP - General (Family Medicine) [name], Dani Gobble, MD as PCP - Cardiology (Cardiology) Gala Romney Cristopher Estimable, MD as Consulting Physician (Gastroenterology) Brunetta Genera, MD as Consulting Physician (Hematology)  CHIEF COMPLAINTS/PURPOSE OF CONSULTATION:  B-cell chronic lymphoproliferative disorder  HISTORY OF PRESENTING ILLNESS:   [name] is a wonderful 87 y.o. female who has been referred to [place] by Dr .Wolfgang Phoenix, Elayne Snare, MD for evaluation and management of leukocytosis.  She has a history of hiatal hernia, hypertension, dyslipidemia, hypothyroidism who was noted to have lower urinary tract infection for which she was treated in urgent care on 12/03/2021 with ciprofloxacin and lower respiratory tract infection for which she visited urgent care on 1/15-2023 for which she was treated with doxycycline albuterol as needed and Mucinex.  Patient was also noted to have atrial fibrillation since her pneumonia and has been placed on Eliquis. She had a CT of the chest without contrast with Dr. Wolfgang Phoenix in light of persistent radiographic changes of pneumonia. This showed cardiomegaly, moderate-sized hiatal hernia and mild bilateral patchy interstitial lung disease in the peripheral areas compatible with chronic change cannot rule out pneumonitis.  Patient notes that she has been referred for an echocardiogram due to her cardiomegaly.  She also has been placed on diuretics per her primary care physician.  As part of her labs she has been noted to have some leukocytosis ranging between 10-13.7 over the last year with primarily lymphocytosis ranging from 4.5 through 6.9k,  She has been referred to [place] for further evaluation regarding this.  Patient currently notes no fevers no chills no night sweats no unexpected weight loss. Still has some fatigue and some mild shortness of breath with  exertion. No reported new lumps or bumps. No reported new abdominal pain or distention.   Patient was seen at an outside hospital in [place] by Dr. Delton Coombes and had a flow cytometry done on 09/05/2021 which showed a monoclonal B-cell population representing 27% of all lymphocytes.  Lymphocyte population lacked CD10 and had questionable dim CD5 expression, expresses pan B7 antigens including CD20.  INTERVAL HISTORY:  [name] is a 87 y.o. female here for evaluation and management of monoclonal B lymphocytosis.   Patient was last seen by me on 05/25/2022 and she complained of persistent SOB.  Patient notes she has been doing fairly well since our last visit. She reports that her possible pulmonary fibrosis is giving her issues. She complains of persistent SOB due to possible pulmonary fibrosis. She follows with Dr Lenice Llamas for pulmonolgy. She believes patient has a slow moving phenotype and is not recommending anti fibrotic treatment at this time.  She denies fever, chills, night sweats, unexpected weight loss, new lumps/bumps, abdominal pain, back pain, chest pain, or leg swelling.  Labs done today were discussed with her in details.  MEDICAL HISTORY:  Past Medical History:  Diagnosis Date   Chronic Leukemia    Depression    H/O hiatal hernia    Hernia    Hernia    right inguinal hernia   Hypertension    Hypothyroidism    Leg swelling    Osteitis pubis ([place]) 05/30/2001   Osteopenia 03/30/2009   Osteopenia 07/30/2022   Bone density 2021 showed elevated FRAX patient was started on Fosamax   Thyroid disease     SURGICAL HISTORY: Past Surgical History:  Procedure Laterality Date   ABDOMINAL HYSTERECTOMY  03/1970   APPENDECTOMY  03/1966   BILATERAL SALPINGOOPHORECTOMY     CARDIOVERSION N/A 03/10/2022   Procedure: CARDIOVERSION;  Surgeon: Sanda Klein, MD;  Location: [facility];  Service: Cardiovascular;  Laterality: N/A;   CATARACT EXTRACTION W/PHACO Right  09/25/2014   Procedure: CATARACT EXTRACTION PHACO AND INTRAOCULAR LENS PLACEMENT ([place]);  Surgeon: Elta Guadeloupe T. Gershon Crane, MD;  Location: [facility];  Service: Ophthalmology;  Laterality: Right;  CDE:7.56   CATARACT EXTRACTION W/PHACO Left 10/09/2014   Procedure: CATARACT EXTRACTION PHACO AND INTRAOCULAR LENS PLACEMENT LEFT EYE CDE=6.51;  Surgeon: Elta Guadeloupe T. Gershon Crane, MD;  Location: [facility];  Service: Ophthalmology;  Laterality: Left;   [place]   COLONOSCOPY  March 2007   Repeat 10 years   COLONOSCOPY  11/06/2003    Internal and external hemorrhoids/Two tiny rectal distal sigmoid polyps cold biopsied/Otherwise within normal limits to the cecum   COLONOSCOPY N/A 08/29/2014   Dr. Gala Romney: Sessile polyp was found at the cecum status post cold snare polypectomy, tubular adenoma. No further surveillance unless clinical status changes   ESOPHAGOGASTRODUODENOSCOPY  2007   Dr. Gala Romney: normal esophagus, small tom oderate sized hiatal hernia, [place] lesions   SKIN CANCER EXCISION Left 11 1 2016   left arm squamous cancer   TUMOR REMOVAL  03/1968   ovarian    SOCIAL HISTORY: Social History   Socioeconomic History   Marital status: Widowed    Spouse name: Not on file   Number of children: 1   Years of education: 18   Highest education level: Not on file  Occupational History   Occupation: Retired    Fish farm manager: RETIRED    Comment: [employer], retired in [place] Use   Smoking status: Never   Smokeless tobacco: Never   Tobacco comments:    Never smoked  Substance and Sexual Activity   Alcohol use: Yes    Alcohol/week: 0.0 standard drinks of alcohol    Comment: occasional margarita   Drug use: No   Sexual activity: Not Currently    Birth control/protection: Surgical  Other Topics Concern   Not on file  Social History Narrative   Retired from [employer].   Widowed since 08/01/21. Husband W.E. Minassian, died from Kidney Cancer.   Son-Tommy lives nearby.   2 grandson's deceased,  Clint and Erlene Quan. Erlene Quan passed 02/2022 from Brain Cancer.    Social Determinants of Health   Financial Resource Strain: Low Risk  (05/27/2021)   Overall Financial Resource Strain (CARDIA)    Difficulty of Paying Living Expenses: Not hard at all  Food Insecurity: No Food Insecurity (06/09/2022)   Hunger Vital Sign    Worried About Running Out of Food in the Last Year: Never true    Ran Out of Food in the Last Year: Never true  Transportation Needs: No Transportation Needs (06/09/2022)   PRAPARE - Hydrologist (Medical): No    Lack of Transportation (Non-Medical): No  Physical Activity: Sufficiently Active (06/09/2022)   Exercise Vital Sign    Days of Exercise per Week: 5 days    Minutes of Exercise per Session: 30 min  Stress: Stress Concern Present (06/09/2022)   [place]    Feeling of Stress : Rather much  Social Connections: Moderately Integrated (06/09/2022)   Social Connection and Isolation Panel [NHANES]    Frequency of Communication with Friends and Family: More than three times a week    Frequency  of Social Gatherings with Friends and Family: More than three times a week    Attends Religious Services: More than 4 times per year    Active Member of [employer] or Organizations: Yes    Attends Archivist Meetings: More than 4 times per year    Marital Status: Widowed  Intimate Partner Violence: Not At Risk (06/09/2022)   Humiliation, Afraid, Rape, and Kick questionnaire    Fear of Current or Ex-Partner: No    Emotionally Abused: No    Physically Abused: No    Sexually Abused: No    FAMILY HISTORY: Family History  Problem Relation Age of Onset   Dementia Mother    Heart disease Father    Cancer Father        throat   Cancer Sister        breast   Colon cancer Neg Hx    Lung disease Neg Hx    Autoimmune disease Neg Hx     ALLERGIES:  is allergic to erythromycin,  aspirin, and ibuprofen.  MEDICATIONS:  Current Outpatient Medications  Medication Sig Dispense Refill   acetaminophen (TYLENOL) 650 MG CR tablet Take 650 mg by mouth every 8 (eight) hours as needed for pain.     albuterol (VENTOLIN HFA) 108 (90 Base) MCG/ACT inhaler INHALE 2 PUFFS INTO THE LUNGS EVERY 6 HOURS AS NEEDED FOR WHEEZING OR SHORTNESS OF BREATH 18 g 1   alendronate (FOSAMAX) 70 MG tablet TAKE 1 TABLET(70 MG) BY MOUTH EVERY 7 DAYS WITH A FULL GLASS OF WATER AND ON AN EMPTY STOMACH 4 tablet 6   ALPRAZolam (XANAX) 0.5 MG tablet May use 1/2 tablet during the day as needed, may use 1 tablet at nighttime 45 tablet 4   amoxicillin (AMOXIL) 500 MG capsule One tid for 7 days 21 capsule 0   apixaban (ELIQUIS) 5 MG TABS tablet Take 1 tablet (5 mg total) by mouth 2 (two) times daily. 60 tablet 11   Cholecalciferol (VITAMIN D3) 2000 UNITS TABS Take 4,000 Units by mouth daily.     indapamide (LOZOL) 1.25 MG tablet TAKE 1 TABLET BY MOUTH EVERY MORNING 90 tablet 1   levothyroxine (SYNTHROID) 125 MCG tablet TAKE 1 TABLET BY MOUTH DAILY 90 tablet 1   metoprolol succinate (TOPROL-XL) 25 MG 24 hr tablet Take one tablet daily 90 tablet 1   Multiple Vitamin (MULTI-VITAMIN DAILY PO) Take 1 tablet by mouth daily.     pantoprazole (PROTONIX) 40 MG tablet TAKE 1 TABLET(40 MG) BY MOUTH DAILY 90 tablet 1   potassium chloride (KLOR-CON M) 10 MEQ tablet TAKE 1 TABLET(10 MEQ) BY MOUTH DAILY 90 tablet 1   sertraline (ZOLOFT) 50 MG tablet 1 qd 90 tablet 1   No current facility-administered medications for this visit.    REVIEW OF SYSTEMS:    10 Point review of Systems was done is negative except as noted above.  PHYSICAL EXAMINATION: ECOG PERFORMANCE STATUS: 1 - Symptomatic but completely ambulatory  . Vitals:   12/01/22 1226  BP: 131/71  Pulse: 87  Resp: 14  Temp: (!) 97.2 F (36.2 C)  SpO2: 97%   Filed Weights   12/01/22 1226  Weight: 191 lb 9.6 oz (86.9 kg)  .Body mass index is 31.88  kg/m. NAD GENERAL:alert, in no acute distress and comfortable SKIN: no acute rashes, no significant lesions EYES: conjunctiva are pink and non-injected, sclera anicteric NECK: supple, no JVD LYMPH:  no palpable lymphadenopathy in the cervical, axillary or inguinal regions LUNGS: clear  to auscultation b/l with normal respiratory effort HEART: regular rate & rhythm ABDOMEN:  normoactive bowel sounds , non tender, not distended. Extremity: no pedal edema PSYCH: alert & oriented x 3 with fluent speech NEURO: no focal motor/sensory deficits  LABORATORY DATA:  I have reviewed the data as listed  .    Latest Ref Rng & Units 12/01/2022   12:04 PM 10/09/2022    4:44 PM 03/03/2022    9:20 AM  CBC  WBC 4.0 - 10.5 K/uL 11.6  14.5  9.7   Hemoglobin 12.0 - 15.0 g/dL 12.4  11.4  11.6   Hematocrit 36.0 - 46.0 % 36.8  35.0  35.3   Platelets 150 - 400 K/uL 352  380  389   .CBC    Component Value Date/Time   WBC 11.6 (H) 12/01/2022 1204   WBC 11.0 (H) 02/02/2022 1217   RBC 4.13 12/01/2022 1204   HGB 12.4 12/01/2022 1204   HGB 11.4 10/09/2022 1644   HCT 36.8 12/01/2022 1204   HCT 35.0 10/09/2022 1644   PLT 352 12/01/2022 1204   PLT 380 10/09/2022 1644   MCV 89.1 12/01/2022 1204   MCV 86 10/09/2022 1644   MCH 30.0 12/01/2022 1204   MCHC 33.7 12/01/2022 1204   RDW 13.7 12/01/2022 1204   RDW 14.0 10/09/2022 1644   LYMPHSABS 4.8 (H) 12/01/2022 1204   LYMPHSABS 8.3 (H) 10/09/2022 1644   MONOABS 0.9 12/01/2022 1204   EOSABS 0.2 12/01/2022 1204   EOSABS 0.2 10/09/2022 1644   BASOSABS 0.0 12/01/2022 1204   BASOSABS 0.1 10/09/2022 1644        Latest Ref Rng & Units 12/01/2022   12:17 PM 10/09/2022    4:44 PM 07/31/2022   10:05 AM  CMP  Glucose 70 - 99 mg/dL 93  118  98   BUN 8 - 23 mg/dL '14  18  15   '$ Creatinine 0.44 - 1.00 mg/dL 0.87  1.08  0.85   Sodium 135 - 145 mmol/L 135  137  135   Potassium 3.5 - 5.1 mmol/L 3.8  4.3  4.5   Chloride 98 - 111 mmol/L 100  97  96   CO2 22 - 32  mmol/L '30  24  24   '$ Calcium 8.9 - 10.3 mg/dL 9.9  9.7  9.9   Total Protein 6.5 - 8.1 g/dL 7.3     Total Bilirubin 0.3 - 1.2 mg/dL 0.7     Alkaline Phos 38 - 126 U/L 51     AST 15 - 41 U/L 18     ALT 0 - 44 U/L 11      SURGICAL PATHOLOGY Surgical Pathology  CASE: [id number]  PATIENT: [name]  Flow Pathology Report      Clinical history: Lymphocytosis      DIAGNOSIS:   -Monoclonal B-cell population identified  -See comment   COMMENT:   Flow cytometric analysis of the lymphoid population shows a monoclonal,  lambda restricted B-cell population representing 27% of all lymphocytes.  This population expresses pan-B-cell antigens including CD20 with lack  of CD10, CD25 or CD103 expression.  There is questionable dim CD5  expression.  The findings are consistent with a B-cell  lymphoproliferative process and may represent monoclonal B-cell  lymphocytosis especially in the absence of lymphadenopathy, organomegaly  or extra medullary tissue involvement.  Clinical correlation is  recommended.   GATING AND PHENOTYPIC ANALYSIS:   Gated population: Flow cytometric immunophenotyping is performed using  antibodies to the antigens  listed in the table below. Electronic gates  are placed around a cell cluster displaying light scatter properties  corresponding to: lymphocytes (total lymphocyte count is 5.6 K/uL)   Abnormal Cells in gated population: 27%   Phenotype of Abnormal Cells: CD19, CD20, CD22, CD200, Lambda    FISH panel done 02/02/2022   RADIOGRAPHIC STUDIES: I have personally reviewed the radiological images as listed and agreed with the findings in the report.  No results found.  ASSESSMENT & PLAN:  87 year old with  #1 Mild lymphocytosis with evidence of B-cell chronic lymphoproliferative process- Likely Monoclonal B lymphocytosis Flow cytometry in October 2022 showed 27% of lymphocytes represented a monoclonal population that had pan B-cell antigens  including CD20, negative for CD10 and had questionable dim CD5 expression. PET/CT 05/13/2022-No enlarged or hypermetabolic lymphadenopathy is identified in the neck, chest, abdomen or pelvis. No abnormal osseous findings.  #2 possible pulmonary fibrosis- following up with her Pulmonologist Dr Lenice Llamas.   PLAN: -Discussed lab results in results from today, 12/01/2022, with the patient. CBC shows WBC of 11.6 K. Stable CMP. -Answered all of patient's questions regarding monoclonal B lymphocytosis -Recommended influenza vaccine COVID-19 Booster, and RSV vaccine. -Recommended staying up to date with all other vaccinations.  -Continue following up with Pulmonologist for her lung problems.  -I discussed all the available labs and imaging studies with the patient. -Patient clinically does not have lymphadenopathy or splenomegaly to suggest diagnosis of CLL and the absolute number of clonal B lymphocytes does not cross 5000 per cubic microliter to suggest CLL. -Answered patient's questions about goals of care..  No orders of the defined types were placed in this encounter.  FOLLOW-UP: RTC with Dr Irene Limbo with labs in 12 months  The total time spent in the appointment was 20 minutes* .  All of the patient's questions were answered with apparent satisfaction. The patient knows to call the clinic with any problems, questions or concerns.   Sullivan Lone MD MS AAHIVMS [facility] Hematology/Oncology Physician [facility]  .*Total Encounter Time as defined by the Centers for Medicare and Medicaid Services includes, in addition to the face-to-face time of a patient visit (documented in the note above) non-face-to-face time: obtaining and reviewing outside history, ordering and reviewing medications, tests or procedures, care coordination (communications with other health care professionals or caregivers) and documentation in the medical record.   I, Cleda Mccreedy, am acting as a Education administrator for  Sullivan Lone, MD.  .I have reviewed the above documentation for accuracy and completeness, and I agree with the above. Brunetta Genera MD

## 2022-12-03 ENCOUNTER — Other Ambulatory Visit: Payer: Self-pay | Admitting: Family Medicine

## 2022-12-16 ENCOUNTER — Other Ambulatory Visit: Payer: Self-pay

## 2022-12-16 MED ORDER — SERTRALINE HCL 50 MG PO TABS: ORAL_TABLET | ORAL | 1 refills | Status: DC

## 2022-12-23 ENCOUNTER — Other Ambulatory Visit: Payer: Self-pay | Admitting: Family Medicine

## 2022-12-24 DIAGNOSIS — F4381 Prolonged grief disorder: Secondary | ICD-10-CM | POA: Diagnosis not present

## 2022-12-24 DIAGNOSIS — F411 Generalized anxiety disorder: Secondary | ICD-10-CM | POA: Diagnosis not present

## 2022-12-24 DIAGNOSIS — Z634 Disappearance and death of family member: Secondary | ICD-10-CM | POA: Diagnosis not present

## 2022-12-24 DIAGNOSIS — R0600 Dyspnea, unspecified: Secondary | ICD-10-CM | POA: Diagnosis not present

## 2022-12-24 DIAGNOSIS — Z515 Encounter for palliative care: Secondary | ICD-10-CM | POA: Diagnosis not present

## 2022-12-24 DIAGNOSIS — R059 Cough, unspecified: Secondary | ICD-10-CM | POA: Diagnosis not present

## 2022-12-24 DIAGNOSIS — R5383 Other fatigue: Secondary | ICD-10-CM | POA: Diagnosis not present

## 2022-12-24 DIAGNOSIS — J84112 Idiopathic pulmonary fibrosis: Secondary | ICD-10-CM | POA: Diagnosis not present

## 2023-01-27 DIAGNOSIS — F329 Major depressive disorder, single episode, unspecified: Secondary | ICD-10-CM | POA: Diagnosis not present

## 2023-01-27 DIAGNOSIS — F4381 Prolonged grief disorder: Secondary | ICD-10-CM | POA: Diagnosis not present

## 2023-01-27 DIAGNOSIS — F411 Generalized anxiety disorder: Secondary | ICD-10-CM | POA: Diagnosis not present

## 2023-01-28 ENCOUNTER — Ambulatory Visit (INDEPENDENT_AMBULATORY_CARE_PROVIDER_SITE_OTHER): Payer: PPO | Admitting: Family Medicine

## 2023-01-28 ENCOUNTER — Encounter: Payer: Self-pay | Admitting: Radiology

## 2023-01-28 VITALS — BP 136/82 | HR 69 | Wt 195.2 lb

## 2023-01-28 DIAGNOSIS — E7849 Other hyperlipidemia: Secondary | ICD-10-CM

## 2023-01-28 DIAGNOSIS — I4819 Other persistent atrial fibrillation: Secondary | ICD-10-CM | POA: Diagnosis not present

## 2023-01-28 DIAGNOSIS — D479 Neoplasm of uncertain behavior of lymphoid, hematopoietic and related tissue, unspecified: Secondary | ICD-10-CM

## 2023-01-28 DIAGNOSIS — R5383 Other fatigue: Secondary | ICD-10-CM | POA: Diagnosis not present

## 2023-01-28 DIAGNOSIS — I10 Essential (primary) hypertension: Secondary | ICD-10-CM

## 2023-01-28 DIAGNOSIS — R0609 Other forms of dyspnea: Secondary | ICD-10-CM

## 2023-01-28 DIAGNOSIS — E039 Hypothyroidism, unspecified: Secondary | ICD-10-CM

## 2023-01-28 DIAGNOSIS — C9110 Chronic lymphocytic leukemia of B-cell type not having achieved remission: Secondary | ICD-10-CM

## 2023-01-28 DIAGNOSIS — F321 Major depressive disorder, single episode, moderate: Secondary | ICD-10-CM | POA: Diagnosis not present

## 2023-01-28 DIAGNOSIS — Z515 Encounter for palliative care: Secondary | ICD-10-CM | POA: Diagnosis not present

## 2023-01-28 DIAGNOSIS — I5032 Chronic diastolic (congestive) heart failure: Secondary | ICD-10-CM

## 2023-01-28 DIAGNOSIS — R0600 Dyspnea, unspecified: Secondary | ICD-10-CM | POA: Diagnosis not present

## 2023-01-28 DIAGNOSIS — J84112 Idiopathic pulmonary fibrosis: Secondary | ICD-10-CM | POA: Diagnosis not present

## 2023-01-28 MED ORDER — SERTRALINE HCL 100 MG PO TABS: ORAL_TABLET | ORAL | 1 refills | Status: DC

## 2023-01-28 NOTE — Progress Notes (Signed)
   Subjective:    Patient ID: [name], female    DOB: 1936/03/10, 87 y.o.   MRN: [id number]  HPI Patient arrives today for 6 month follow up.  Idiopathic pulmonary fibrosis (HCC)  Lymphoproliferative disease (HCC)  Depression, major, single episode, moderate (HCC), Chronic  Chronic diastolic (congestive) heart failure (HCC)  Persistent atrial fibrillation (HCC), Chronic  Chronic lymphocytic leukemia (HCC)  DOE (dyspnea on exertion)  Essential hypertension, benign  Acquired hypothyroidism  Other hyperlipidemia  Patient has underlying idiopathic pulmonary fibrosis appears to be slowly progressing Patient also has lymphoproliferative disease and is being followed by hematology She is dealing with depression related into the loss of her husband Also has history of diastolic heart failure atrial fibrillation which are under decent control also history of chronic lymphocytic leukemia which is being stable recently  She denies any fevers chills sweats.  Appetite doing well.  Gets out of breath with activity. Review of Systems     Objective:   Physical Exam General-in no acute distress Eyes-no discharge Lungs-respiratory rate normal, CTA CV-no murmurs irregular rhythm yet rate is controlled Extremities skin warm dry no edema Neuro grossly normal Behavior normal, alert        Assessment & Plan:   We will do lab work before next visit Follow-up again in 3 to 4 months Patient currently approaching this from a palliative standpoint She is concerned that trying any medication would cause her to be very sick and impact her quality of life Blood pressure rechecked look good  1. Idiopathic pulmonary fibrosis ([place]) Patient currently is approaching this from a palliative standpoint.  She feels that she has lived a good life and does not want to do any aggressive treatments.  She is interested if the therapy that they recommend for this would significantly extend her  life or just partially help.  Also she is interested if no treatment is very tolerable or intolerable  2. Chronic lymphocytic leukemia ([place]) Recently saw hematology they told her to follow-up in 1 year she was a little bit unsettled by that but after further discussion we agreed to do some blood work before her next visit in 3 months to monitor her white blood count also told her if she has severe fatigue night sweats high fevers or chills to follow-up immediately  3. DOE (dyspnea on exertion) This is related into her idiopathic pulmonary fibrosis along with underlying heart disease  4. Essential hypertension, benign Blood pressure good control continue current measures  5. Acquired hypothyroidism Thyroid good control continue current measures  6. Other hyperlipidemia Cholesterol good control continue current measures healthy diet  7. Chronic diastolic (congestive) heart failure (HCC) No sinus overt CHF currently  8. Lymphoproliferative disease ([place]) See discussion above.  Will recheck CBC before her visit this June  9. Depression, major, single episode, moderate ([place]) Patient dealing with a lot of depression symptoms related to her husband's death bump up dose of sertraline new dose 100 mg daily  10. Persistent atrial fibrillation (HCC) On Eliquis no bleeding issues.  Continue current measures  Communication was sent to her pulmonologist await the results

## 2023-02-04 ENCOUNTER — Telehealth: Payer: Self-pay | Admitting: Family Medicine

## 2023-02-04 NOTE — Telephone Encounter (Signed)
Nurses I told the patient that I would communicate with her specialist.  Eyes communicate with her specialist regarding her lung condition.  They did not feel that the medications would necessarily be greatly beneficial.  But they said that they would be happy to discuss this further on her next follow-up visit.  They also felt that her lung condition was a slow progression in the lung condition.  Apparently that is the better type to have.  If she would like to discuss this further with me and let me know and let me know convenient time for [place] to call her.  I tried to call her and there was no answer.  Otherwise she can discuss this further with pulmonologist when she sees him in early April and then she has a follow-up with me in May  Here is a pulmonary message-for future reference no need to review this part with the patient Spero Geralds, MD  Kathyrn Drown, MD Hello! Thanks for your message.  Treatment would be either ofev or esbriet which are two anti-fibrotic medications which have been out for about 10 years. Neither of these medications has any mortality benefit. They have some evidence to delay rate of decline in FVC. Anecdotally, about 1/3 patients I start on anti-fibrotic therapy usually have a dose reduction or cease therapy due to side effects of usually a GI origin (nausea, diarrhea.)  Treatment would not reverse her current symptoms. It might potentially let her live a little longer with the current symptoms she has. Treatment would encounter a reasonable chance of adverse effects. I'm not sure she would perceive a benefit in her quality of life with treatment but it is a generally easy medication to get approved and trial for a while. Many patients take it. Have no side effects, but also don't perceive any improvement in their quality of life on a daily basis as we are talking about small increments (in mLs) of FVC.  Based on her stable lung function over a year, I suspect she has a  slowly progressing phenotype.I would suspect some people would be more inclined to try treatment if they have a more rapid progression? But only she can answer that question.  I am very open to trying medication and discontinuing if she doesn't tolerate it.  I have an appointment with her next month and am happy to discuss further with her.  Hope that helps!  Teressa Senter

## 2023-02-09 DIAGNOSIS — F329 Major depressive disorder, single episode, unspecified: Secondary | ICD-10-CM | POA: Diagnosis not present

## 2023-02-09 DIAGNOSIS — F411 Generalized anxiety disorder: Secondary | ICD-10-CM | POA: Diagnosis not present

## 2023-02-09 DIAGNOSIS — F4381 Prolonged grief disorder: Secondary | ICD-10-CM | POA: Diagnosis not present

## 2023-02-11 NOTE — Telephone Encounter (Signed)
Left message to return call 

## 2023-02-12 NOTE — Telephone Encounter (Signed)
Patient has returned call and was informed per drs notes regarding pulmonary condition and treatment options. Patient verbalized understanding and would like to also have a call from dr Nicki Reaper next week, please advise.

## 2023-02-23 DIAGNOSIS — F320 Major depressive disorder, single episode, mild: Secondary | ICD-10-CM | POA: Diagnosis not present

## 2023-03-09 ENCOUNTER — Ambulatory Visit: Payer: PPO | Admitting: Internal Medicine

## 2023-03-09 ENCOUNTER — Encounter: Payer: Self-pay | Admitting: Internal Medicine

## 2023-03-09 VITALS — BP 140/82 | HR 70 | Temp 98.1°F | Ht 65.5 in | Wt 192.6 lb

## 2023-03-09 DIAGNOSIS — J849 Interstitial pulmonary disease, unspecified: Secondary | ICD-10-CM | POA: Diagnosis not present

## 2023-03-09 NOTE — Progress Notes (Signed)
[name]    [id number]    [birth year]  Primary Care Physician:Luking, Jonna Coup, MD Date of Appointment: 03/09/2023 Established Patient Visit  Chief complaint:   Chief Complaint  Patient presents with   Follow-up    More SOB, occ dry cough.      HPI: [name] is a 87 y.o. with ILD, not currently on anti-fibrotic therapy.    Interval Updates: Here for 6 month follow up. Feels there has been a decline in the last 6 months and definitely in the last month with regards to her fatigue and stamina. Now having harder time gardening and with ADLs. Would like to know if there are any options for her dyspnea.  Occasional dry cough, no fevers, chills.    I have reviewed the patient's family social and past medical history and updated as appropriate.   Past Medical History:  Diagnosis Date   Chronic Leukemia    Depression    H/O hiatal hernia    Hernia    Hernia    right inguinal hernia   Hypertension    Hypothyroidism    Leg swelling    Osteitis pubis 05/30/2001   Osteopenia 03/30/2009   Osteopenia 07/30/2022   Bone density 2021 showed elevated FRAX patient was started on Fosamax   Thyroid disease     Past Surgical History:  Procedure Laterality Date   ABDOMINAL HYSTERECTOMY  03/1970   APPENDECTOMY  03/1966   BILATERAL SALPINGOOPHORECTOMY     CARDIOVERSION N/A 03/10/2022   Procedure: CARDIOVERSION;  Surgeon: Thurmon Fair, MD;  [facility];  Service: Cardiovascular;  Laterality: N/A;   CATARACT EXTRACTION W/PHACO Right 09/25/2014   Procedure: CATARACT EXTRACTION PHACO AND INTRAOCULAR LENS PLACEMENT (IOC);  Surgeon: Loraine Leriche T. Nile Riggs, MD;  Location: [facility];  Service: Ophthalmology;  Laterality: Right;  CDE:7.56   CATARACT EXTRACTION W/PHACO Left 10/09/2014   Procedure: CATARACT EXTRACTION PHACO AND INTRAOCULAR LENS PLACEMENT LEFT EYE CDE=6.51;  Surgeon: Loraine Leriche T. Nile Riggs, MD;  Location: [facility];  Service: Ophthalmology;  Laterality: Left;    CHOLECYSTECTOMY  1980   COLONOSCOPY  1999   COLONOSCOPY  March 2007   Repeat 10 years   COLONOSCOPY  11/06/2003    Internal and external hemorrhoids/Two tiny rectal distal sigmoid polyps cold biopsied/Otherwise within normal limits to the cecum   COLONOSCOPY N/A 08/29/2014   Dr. Jena Gauss: Sessile polyp was found at the cecum status post cold snare polypectomy, tubular adenoma. No further surveillance unless clinical status changes   ESOPHAGOGASTRODUODENOSCOPY  2007   Dr. 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  Gen:      No acute distress Lungs:   faint bibasilar crackles, no respiratory distress, no wheeze CV:         RRR no mrg Ext: no edema  Data Reviewed: Imaging: I have personally reviewed the CT scan from Feb 2023 which shows peripheral honeycombing in craniocaudal distribution without ground glass - probable UIP.   PFTs:     Latest Ref Rng & Units 09/04/2022    9:03 AM 02/26/2022    12:39 PM  PFT Results  FVC-Pre L 1.55  1.69   FVC-Predicted Pre % 60  64   FVC-Post L  1.60   FVC-Predicted Post %  60   Pre FEV1/FVC % % 87  85   Post FEV1/FCV % %  90   FEV1-Pre L 1.35  1.44   FEV1-Predicted Pre % 71  74   FEV1-Post L  1.44   DLCO uncorrected ml/min/mmHg 10.50  11.18   DLCO UNC% % 54  57   DLCO corrected ml/min/mmHg 10.50    DLCO COR %Predicted % 54    DLVA Predicted % 101  102   TLC L  2.89   TLC % Predicted %  54   RV % Predicted %  50    I have personally reviewed the patient's PFTs and show moderate restriction with reduced DLCO consistent with ILD  Labs:  Immunization status: Immunization History  Administered Date(s) Administered   Fluad Quad(high Dose 65+) 09/24/2020, 09/04/2022   Influenza, High Dose Seasonal PF 08/30/2021   Influenza,inj,Quad PF,6+ Mos 09/06/2018   Influenza-Unspecified 08/31/2015, 08/06/2016, 09/16/2017, 10/19/2019   PFIZER(Purple Top)SARS-COV-2 Vaccination 12/04/2019, 12/26/2019   Pneumococcal Conjugate-13 06/19/2014   Pneumococcal Polysaccharide-23 10/05/2017   Zoster Recombinat (Shingrix) 01/05/2023   Zoster, Live 10/05/2012    External Records Personally Reviewed: family medicine  Assessment:  ILD, not on anti-fibrotic therapy, with progression of symptoms.   Plan/Recommendations: Will repeat Spirometry and DLCO. They have been stable in the past. She now however feels progression of symptoms. Will proceed with HRCT and repeat pfts. I will see her back after this and we can discuss anti-fibrotic therapy if more evidence of progression.   Return to Care: Return in about 4 weeks (around 04/06/2023).   Durel Salts, MD Pulmonary and Critical Care Medicine [facility] [phone]

## 2023-03-09 NOTE — Patient Instructions (Addendum)
Please schedule follow up scheduled with myself in 1 months.  If my schedule is not open yet, we will contact you with a reminder closer to that time. Please call [phone] if you haven't heard from [place] a month before.   Before your next visit I would like you to have:  Spirometry/DLCO - 30 minutes. Stop by front desk to schedule this.   CT Chest, high resolution. We will call you to schedule this.

## 2023-03-12 ENCOUNTER — Other Ambulatory Visit: Payer: Self-pay | Admitting: Family Medicine

## 2023-03-15 ENCOUNTER — Ambulatory Visit (HOSPITAL_COMMUNITY)
Admission: RE | Admit: 2023-03-15 | Discharge: 2023-03-15 | Disposition: A | Discharge status: Home / Self Care | Payer: PPO | Source: Ambulatory Visit | Attending: Family Medicine | Admitting: Family Medicine

## 2023-03-15 DIAGNOSIS — J849 Interstitial pulmonary disease, unspecified: Secondary | ICD-10-CM | POA: Diagnosis not present

## 2023-03-15 DIAGNOSIS — J479 Bronchiectasis, uncomplicated: Secondary | ICD-10-CM | POA: Diagnosis not present

## 2023-03-15 DIAGNOSIS — J8410 Pulmonary fibrosis, unspecified: Secondary | ICD-10-CM | POA: Diagnosis not present

## 2023-03-24 ENCOUNTER — Telehealth: Payer: Self-pay | Admitting: Family Medicine

## 2023-03-24 MED ORDER — ALENDRONATE SODIUM 70 MG PO TABS: ORAL_TABLET | ORAL | 3 refills | Status: DC

## 2023-03-24 NOTE — Telephone Encounter (Signed)
Received via fax Rx request: Prescription sent electronically to pharmacy  

## 2023-03-24 NOTE — Telephone Encounter (Signed)
Refill on    alendronate (FOSAMAX) 70 MG tablet [facility] scales

## 2023-03-30 ENCOUNTER — Other Ambulatory Visit: Payer: Self-pay | Admitting: Cardiovascular Disease

## 2023-03-30 NOTE — Telephone Encounter (Signed)
Pt last saw Dr Royann Shivers 04/02/22, pt is due for follow-up.  Last labs 12/01/22 Creat 0.87, age 87, weight 87.4kg, based on specified criteria pt is on appropriate dosage of Eliquis 5mg  BID for afib.  Will send msg to schedulers to contact pt for follow-up appt.  Will refill rx.

## 2023-04-02 ENCOUNTER — Telehealth: Payer: Self-pay

## 2023-04-02 DIAGNOSIS — E7849 Other hyperlipidemia: Secondary | ICD-10-CM

## 2023-04-02 DIAGNOSIS — E038 Other specified hypothyroidism: Secondary | ICD-10-CM

## 2023-04-02 DIAGNOSIS — Z79899 Other long term (current) drug therapy: Secondary | ICD-10-CM

## 2023-04-02 DIAGNOSIS — I10 Essential (primary) hypertension: Secondary | ICD-10-CM

## 2023-04-02 NOTE — Telephone Encounter (Signed)
Pt called she has appt coming up on the 29th and will be on vacation the week before and needs her blood work ordered.  Amedeo Plenty [phone]

## 2023-04-05 NOTE — Telephone Encounter (Signed)
Metabolic 7, lipid, magnesium, TSH, free T4  Hypothyroidism, diuretic use, screening hyperlipidemia

## 2023-04-07 NOTE — Addendum Note (Signed)
Addended by: Margaretha Sheffield on: 04/07/2023 02:06 PM   Modules accepted: Orders

## 2023-04-07 NOTE — Telephone Encounter (Signed)
Blood work ordered in EPIC.  Left message to return call to notify patient 

## 2023-04-08 ENCOUNTER — Telehealth: Payer: Self-pay | Admitting: Pharmacist

## 2023-04-08 ENCOUNTER — Ambulatory Visit (INDEPENDENT_AMBULATORY_CARE_PROVIDER_SITE_OTHER): Payer: PPO | Admitting: Internal Medicine

## 2023-04-08 ENCOUNTER — Ambulatory Visit: Payer: PPO | Admitting: Internal Medicine

## 2023-04-08 ENCOUNTER — Encounter: Payer: Self-pay | Admitting: Internal Medicine

## 2023-04-08 VITALS — BP 126/74 | HR 83 | Temp 98.2°F | Ht 65.5 in | Wt 190.0 lb

## 2023-04-08 DIAGNOSIS — Z5181 Encounter for therapeutic drug level monitoring: Secondary | ICD-10-CM | POA: Diagnosis not present

## 2023-04-08 DIAGNOSIS — J84112 Idiopathic pulmonary fibrosis: Secondary | ICD-10-CM | POA: Diagnosis not present

## 2023-04-08 DIAGNOSIS — J849 Interstitial pulmonary disease, unspecified: Secondary | ICD-10-CM | POA: Diagnosis not present

## 2023-04-08 LAB — PULMONARY FUNCTION TEST
DL/VA % pred: 130 %
DL/VA: 5.23 ml/min/mmHg/L
DLCO cor % pred: 68 %
DLCO cor: 13.09 ml/min/mmHg
DLCO unc % pred: 68 %
DLCO unc: 13.09 ml/min/mmHg
FEF 25-75 Pre: 1.87 L/sec
FEF2575-%Pred-Pre: 154 %
FEV1-%Pred-Pre: 78 %
FEV1-Pre: 1.45 L
FEV1FVC-%Pred-Pre: 123 %
FEV6-%Pred-Pre: 68 %
FEV6-Pre: 1.61 L
FEV6FVC-%Pred-Pre: 106 %
FVC-%Pred-Pre: 64 %
FVC-Pre: 1.61 L
Pre FEV1/FVC ratio: 90 %
Pre FEV6/FVC Ratio: 100 %

## 2023-04-08 NOTE — Progress Notes (Signed)
Spirometry and DLCO Performed Today.  

## 2023-04-08 NOTE — Progress Notes (Signed)
Ofev BIV started in new phone encounter  Chesley Mires, PharmD, MPH, BCPS, CPP Clinical Pharmacist (Rheumatology and Pulmonology)

## 2023-04-08 NOTE — Patient Instructions (Signed)
Spirometry and DLCO Performed Today.  

## 2023-04-08 NOTE — Patient Instructions (Signed)
Please schedule follow up scheduled with myself in 3 months.  If my schedule is not open yet, we will contact you with a reminder closer to that time. Please call [phone] if you haven't heard from [place] a month before.   We are starting you on a medication called Ofev for pulmonary fibrosis. This does not cure or reverse the disease, but may slow down the progression. The main side effects are abdominal pain, GI upset.   Our pharmacy team will help get this approved with insurance and will be in touch with you.  You need to get blood work done today, and then once a month come to the lab to have it drawn. You can go to any [place] lab or even come to our office. This is to check your liver function.

## 2023-04-08 NOTE — Telephone Encounter (Addendum)
Received notification of Ofev new start via Cc'd chart. Unsure if pt assistance paperwork was completed. PA form for [employer] faxed for Ofv  Fax: [phone] Phone: [phone]  ----- Message from Charlott Holler, MD sent at 04/08/2023  4:00 PM EDT ----- New start ofev 150 bid

## 2023-04-08 NOTE — Progress Notes (Signed)
[name]    [id number]    1936/03/02  Primary Care Physician:Luking, Jonna Coup, MD Date of Appointment: 04/08/2023 Established Patient Visit  Chief complaint:   Chief Complaint  Patient presents with   Follow-up    PFT and CT review      HPI: [name] is a 87 y.o. with ILD, not currently on anti-fibrotic therapy.    Interval Updates: Here for follow up after pfts and ct chest. These show stable disease without significant progression.   However. She has had progression of symptoms with regards to fatigue and dyspnea over the last 6 months.    Wants to talk about anti-fibrotic therapy.   I have reviewed the patient's family social and past medical history and updated as appropriate.   Past Medical History:  Diagnosis Date   Chronic Leukemia    Depression    H/O hiatal hernia    Hernia    Hernia    right inguinal hernia   Hypertension    Hypothyroidism    Leg swelling    Osteitis pubis (HCC) 05/30/2001   Osteopenia 03/30/2009   Osteopenia 07/30/2022   Bone density 2021 showed elevated FRAX patient was started on Fosamax   Thyroid disease     Past Surgical History:  Procedure Laterality Date   ABDOMINAL HYSTERECTOMY  03/1970   APPENDECTOMY  03/1966   BILATERAL SALPINGOOPHORECTOMY     CARDIOVERSION N/A 03/10/2022   Procedure: CARDIOVERSION;  Surgeon: Thurmon Fair, MD;  Location: [facility];  Service: Cardiovascular;  Laterality: N/A;   CATARACT EXTRACTION W/PHACO Right 09/25/2014   Procedure: CATARACT EXTRACTION PHACO AND INTRAOCULAR LENS PLACEMENT (IOC);  Surgeon: Loraine Leriche T. Nile Riggs, MD;  Location: [facility];  Service: Ophthalmology;  Laterality: Right;  CDE:7.56   CATARACT EXTRACTION W/PHACO Left 10/09/2014   Procedure: CATARACT EXTRACTION PHACO AND INTRAOCULAR LENS PLACEMENT LEFT EYE CDE=6.51;  Surgeon: Loraine Leriche T. Nile Riggs, MD;  Location: [facility];  Service: Ophthalmology;  Laterality: Left;   CHOLECYSTECTOMY  1980   COLONOSCOPY  1999    COLONOSCOPY  March 2007   Repeat 10 years   COLONOSCOPY  11/06/2003    Internal and external hemorrhoids/Two tiny rectal distal sigmoid polyps cold biopsied/Otherwise within normal limits to the cecum   COLONOSCOPY N/A 08/29/2014   Dr. Jena Gauss: Sessile polyp was found at the cecum status post cold snare polypectomy, tubular adenoma. No further surveillance unless clinical status changes   ESOPHAGOGASTRODUODENOSCOPY  2007   Dr. 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  Gen:      No distress Lungs:   bibasilar crackles CV:       RRR Ext:  no edema, thin skin  Data Reviewed: Imaging: I have personally reviewed the CT scan from April 2024 mild subpleural reticulation without honey combing or significant ground glass. Unable to assess for air trapping on expiratory cuts. No substantial progression from 2023.   PFTs:     Latest Ref Rng & Units 04/08/2023    2:09 PM 09/04/2022    9:03 AM 02/26/2022   12:39 PM   PFT Results  FVC-Pre L 1.61  P 1.55  1.69   FVC-Predicted Pre % 64  P 60  64   FVC-Post L   1.60   FVC-Predicted Post %   60   Pre FEV1/FVC % % 90  P 87  85   Post FEV1/FCV % %   90   FEV1-Pre L 1.45  P 1.35  1.44   FEV1-Predicted Pre % 78  P 71  74   FEV1-Post L   1.44   DLCO uncorrected ml/min/mmHg 13.09  P 10.50  11.18   DLCO UNC% % 68  P 54  57   DLCO corrected ml/min/mmHg 13.09  P 10.50    DLCO COR %Predicted % 68  P 54    DLVA Predicted % 130  P 101  102   TLC L   2.89   TLC % Predicted %   54   RV % Predicted %   50     P Preliminary result   I have personally reviewed the patient's PFTs and show moderate restriction with reduced DLCO consistent with ILD. There is no significant change when compared to 2023.   Labs: Lab Results  Component Value Date   NA 135 12/01/2022   K 3.8 12/01/2022   CO2 30 12/01/2022   GLUCOSE 93 12/01/2022   BUN 14 12/01/2022   CREATININE 0.87 12/01/2022   CALCIUM 9.9 12/01/2022   EGFR 51 (L) 10/09/2022   GFRNONAA >60 12/01/2022   Lab Results  Component Value Date   WBC 11.6 (H) 12/01/2022   HGB 12.4 12/01/2022   HCT 36.8 12/01/2022   MCV 89.1 12/01/2022   PLT 352 12/01/2022    Immunization status: Immunization History  Administered Date(s) Administered   Fluad Quad(high Dose 65+) 09/24/2020, 09/04/2022   Influenza, High Dose Seasonal PF 08/30/2021   Influenza,inj,Quad PF,6+ Mos 09/06/2018   Influenza-Unspecified 08/31/2015, 08/06/2016, 09/16/2017, 10/19/2019   PFIZER(Purple Top)SARS-COV-2 Vaccination 12/04/2019, 12/26/2019   Pneumococcal Conjugate-13 06/19/2014   Pneumococcal Polysaccharide-23 10/05/2017   Zoster Recombinat (Shingrix) 01/05/2023   Zoster, Live 10/05/2012    External Records Personally Reviewed: family medicine  Assessment:  ILD, not on anti-fibrotic therapy, with progression of symptoms.  Encounter for high risk drug monitoring.  Plan/Recommendations: We have performed repeat CT scan and PFTs,  neither of which show demonstrable progression of disease. However she does have restriction to ventilation and is symptomatic.   We discussed anti-fibrotic therapy today with ofev and esbriet.   We are starting you on a medication called Ofev for pulmonary fibrosis. This does not cure or reverse the disease, but may slow down  the progression. The main side effects are abdominal pain, GI upset.   Our pharmacy team will help get this approved with insurance and will be in touch with you.  You need to get blood work done today, and then once a month come to the lab to have it drawn. You can go to any [place] lab or even come to our office. This is to check your liver function.  Return to Care: Return in about 3 months (around 07/09/2023).   Durel Salts, MD Pulmonary and Critical Care Medicine [facility] Office:[phone]

## 2023-04-09 LAB — COMPREHENSIVE METABOLIC PANEL
ALT: 11 U/L (ref 0–35)
AST: 20 U/L (ref 0–37)
Albumin: 4.1 g/dL (ref 3.5–5.2)
Alkaline Phosphatase: 60 U/L (ref 39–117)
BUN: 17 mg/dL (ref 6–23)
CO2: 30 mEq/L (ref 19–32)
Calcium: 9.4 mg/dL (ref 8.4–10.5)
Chloride: 97 mEq/L (ref 96–112)
Creatinine, Ser: 0.96 mg/dL (ref 0.40–1.20)
GFR: 54.08 mL/min — ABNORMAL LOW (ref >60.00)
Glucose, Bld: 87 mg/dL (ref 70–99)
Potassium: 3.6 mEq/L (ref 3.5–5.1)
Sodium: 136 mEq/L (ref 135–145)
Total Bilirubin: 0.5 mg/dL (ref 0.2–1.2)
Total Protein: 7.6 g/dL (ref 6.0–8.3)

## 2023-04-09 NOTE — Telephone Encounter (Signed)
Left message to return call 

## 2023-04-09 NOTE — Telephone Encounter (Signed)
Talked with pt as follow up She is noticing neuropathy in her toes, tremors in the hands, and fast heart rate with activity she has appt in May 29

## 2023-04-12 DIAGNOSIS — F320 Major depressive disorder, single episode, mild: Secondary | ICD-10-CM | POA: Diagnosis not present

## 2023-04-12 NOTE — Telephone Encounter (Signed)
Received a fax regarding Prior Authorization from  [employer]  for OFEV. Authorization has been DENIED because must have UIP on HRCT in patients who have not undergone lung biopsy. Patient's HRCT showed probably UIP.  Completed new PA form for ILD d/t progressive phenotype dx (J84.170). Faxed to [employer]  Fax: [phone] Phone: [phone]  Chesley Mires, PharmD, MPH, BCPS, CPP Clinical Pharmacist (Rheumatology and Pulmonology)

## 2023-04-14 DIAGNOSIS — E7849 Other hyperlipidemia: Secondary | ICD-10-CM | POA: Diagnosis not present

## 2023-04-14 DIAGNOSIS — I10 Essential (primary) hypertension: Secondary | ICD-10-CM | POA: Diagnosis not present

## 2023-04-14 DIAGNOSIS — Z79899 Other long term (current) drug therapy: Secondary | ICD-10-CM | POA: Diagnosis not present

## 2023-04-14 DIAGNOSIS — E038 Other specified hypothyroidism: Secondary | ICD-10-CM | POA: Diagnosis not present

## 2023-04-15 LAB — BASIC METABOLIC PANEL
BUN/Creatinine Ratio: 15 (ref 12–28)
BUN: 15 mg/dL (ref 8–27)
CO2: 26 mmol/L (ref 20–29)
Calcium: 9.3 mg/dL (ref 8.7–10.3)
Chloride: 98 mmol/L (ref 96–106)
Creatinine, Ser: 0.98 mg/dL (ref 0.57–1.00)
Glucose: 98 mg/dL (ref 70–99)
Potassium: 4.1 mmol/L (ref 3.5–5.2)
Sodium: 138 mmol/L (ref 134–144)
eGFR: 57 mL/min/{1.73_m2} — ABNORMAL LOW (ref >59)

## 2023-04-15 LAB — TSH: TSH: 1.86 u[IU]/mL (ref 0.450–4.500)

## 2023-04-15 LAB — LIPID PANEL
Chol/HDL Ratio: 2.6 ratio (ref 0.0–4.4)
Cholesterol, Total: 166 mg/dL (ref 100–199)
HDL: 65 mg/dL (ref >39)
LDL Chol Calc (NIH): 86 mg/dL (ref 0–99)
Triglycerides: 82 mg/dL (ref 0–149)
VLDL Cholesterol Cal: 15 mg/dL (ref 5–40)

## 2023-04-15 LAB — T4, FREE: Free T4: 1.17 ng/dL (ref 0.82–1.77)

## 2023-04-15 LAB — MAGNESIUM: Magnesium: 1.7 mg/dL (ref 1.6–2.3)

## 2023-04-19 ENCOUNTER — Other Ambulatory Visit (HOSPITAL_COMMUNITY): Payer: Self-pay

## 2023-04-19 NOTE — Telephone Encounter (Signed)
Received notification from  [facility] [employer]  regarding a prior authorization for OFEV. Authorization has been APPROVED from 04/15/2023 to 04/12/2024. Approval letter sent to scan center.  Per test claim, copay for 30 days supply is $2,591.89  Patient  appears to be able to  fill through [facility]: [phone]   Authorization # [id number]   Reached out to pt--she states that she has her portion of the application at home and has been working on it, however she has left to go out of town prior to completion. Pt states she will finish it when she returns next week and will come drop it off at the clinic directly. I provided her with my direct office phone number should she have any questions.  Provider portion drafted up and placed in Dr. Humphrey Rolls box for signature. Additional copy of provider portion placed in "Awaiting response" folder along with other supporting documents.

## 2023-04-23 NOTE — Telephone Encounter (Signed)
Have been unable to reach patient by phone. Patient has follow up office visit with Dr Lorin Picket 04/28/23

## 2023-04-28 ENCOUNTER — Ambulatory Visit (INDEPENDENT_AMBULATORY_CARE_PROVIDER_SITE_OTHER): Payer: PPO | Admitting: Family Medicine

## 2023-04-28 VITALS — BP 130/83 | HR 56 | Ht 65.5 in | Wt 185.2 lb

## 2023-04-28 DIAGNOSIS — R0609 Other forms of dyspnea: Secondary | ICD-10-CM

## 2023-04-28 DIAGNOSIS — I4819 Other persistent atrial fibrillation: Secondary | ICD-10-CM | POA: Diagnosis not present

## 2023-04-28 DIAGNOSIS — R350 Frequency of micturition: Secondary | ICD-10-CM

## 2023-04-28 DIAGNOSIS — C9110 Chronic lymphocytic leukemia of B-cell type not having achieved remission: Secondary | ICD-10-CM | POA: Diagnosis not present

## 2023-04-28 DIAGNOSIS — I10 Essential (primary) hypertension: Secondary | ICD-10-CM | POA: Diagnosis not present

## 2023-04-28 DIAGNOSIS — J84112 Idiopathic pulmonary fibrosis: Secondary | ICD-10-CM | POA: Diagnosis not present

## 2023-04-28 DIAGNOSIS — G63 Polyneuropathy in diseases classified elsewhere: Secondary | ICD-10-CM | POA: Diagnosis not present

## 2023-04-28 NOTE — Progress Notes (Signed)
Subjective:    Patient ID: [name], female    DOB: 1936/11/26, 87 y.o.   MRN: [id number]  HPI Patient arrives today for 3 month follow up. Patient state she is concerned that she having trouble urinating.  Is burning.  She states she is unable to give a urine specimen today. Patient also state she is having numbness in her feet.  We did discuss how the numbness in her feet it just feels like her toes are asleep but not having pain or discomfort no balance issues associated with this patient states she will be glad to make follow up appointment for her feet if she needs to.   Results for orders placed or performed in visit on 04/08/23  Comp Met (CMET)  Result Value Ref Range   Sodium 136 135 - 145 mEq/L   Potassium 3.6 3.5 - 5.1 mEq/L   Chloride 97 96 - 112 mEq/L   CO2 30 19 - 32 mEq/L   Glucose, Bld 87 70 - 99 mg/dL   BUN 17 6 - 23 mg/dL   Creatinine, Ser 9.14 0.40 - 1.20 mg/dL   Total Bilirubin 0.5 0.2 - 1.2 mg/dL   Alkaline Phosphatase 60 39 - 117 U/L   AST 20 0 - 37 U/L   ALT 11 0 - 35 U/L   Total Protein 7.6 6.0 - 8.3 g/dL   Albumin 4.1 3.5 - 5.2 g/dL   GFR 78.29 (L) >56.21 mL/min   Calcium 9.4 8.4 - 10.5 mg/dL    Outpatient Encounter Medications as of 04/28/2023  Medication Sig   acetaminophen (TYLENOL) 650 MG CR tablet Take 650 mg by mouth every 8 (eight) hours as needed for pain.   albuterol (VENTOLIN HFA) 108 (90 Base) MCG/ACT inhaler INHALE 2 PUFFS INTO THE LUNGS EVERY 6 HOURS AS NEEDED FOR WHEEZING OR SHORTNESS OF BREATH   alendronate (FOSAMAX) 70 MG tablet TAKE 1 TABLET(70 MG) BY MOUTH EVERY 7 DAYS WITH A FULL GLASS OF WATER AND ON AN EMPTY STOMACH   ALPRAZolam (XANAX) 0.5 MG tablet TAKE 1 TABLET BY MOUTH EVERY NIGHT AT BEDTIME. MAY TAKE 1/2 TABLET DURING THE DAY AS NEEDED   apixaban (ELIQUIS) 5 MG TABS tablet Take 1 tablet (5 mg total) by mouth 2 (two) times daily. Pt DUE for FOLLOW-UP, MUST see PROVIDER for FUTURE refills.   Cholecalciferol (VITAMIN D3) 2000  UNITS TABS Take 4,000 Units by mouth daily.   indapamide (LOZOL) 1.25 MG tablet TAKE 1 TABLET BY MOUTH EVERY MORNING   levothyroxine (SYNTHROID) 125 MCG tablet TAKE 1 TABLET BY MOUTH DAILY   metoprolol succinate (TOPROL-XL) 25 MG 24 hr tablet TAKE 1 TABLET BY MOUTH DAILY   Multiple Vitamin (MULTI-VITAMIN DAILY PO) Take 1 tablet by mouth daily.   pantoprazole (PROTONIX) 40 MG tablet TAKE 1 TABLET(40 MG) BY MOUTH DAILY   potassium chloride (KLOR-CON M) 10 MEQ tablet TAKE 1 TABLET(10 MEQ) BY MOUTH DAILY   sertraline (ZOLOFT) 100 MG tablet 1 qd   [DISCONTINUED] amoxicillin (AMOXIL) 500 MG capsule One tid for 7 days   No facility-administered encounter medications on file as of 04/28/2023.   Urinary frequency  Essential hypertension, benign  Persistent atrial fibrillation (HCC)  Idiopathic pulmonary fibrosis (HCC)  DOE (dyspnea on exertion)  Chronic lymphocytic leukemia (HCC)  Polyneuropathy associated with underlying disease (HCC)  She is concerned about her underlying chronic lymphocytic leukemia we did check a CBC white blood count reassuringly within a reasonable range we did discuss warning signs what  to watch for also discussed illness avoidance.  Review of Systems     Objective:   Physical Exam General-in no acute distress Eyes-no discharge Lungs-respiratory rate normal, CTA CV-no murmurs,RRR Extremities skin warm dry no edema Neuro grossly normal Behavior normal, alert  As per neuropathy in her toes and mid feet but does not go into the ankle or into the lower leg.  No weakness or foot drop with this patient able to get up and walk without difficulty no ataxia      Assessment & Plan:  1. Urinary frequency We will have the patient collect a urine at home she was unable to give a urine here she will follow-up again in approximately 24 hours with a urine for [place] to look at  2. Essential hypertension, benign Blood pressure under good control continue current measures  healthy diet  3. Persistent atrial fibrillation (HCC) Using blood thinner no falls recently fall prevention was discussed including using a walking staff when outside  4. Idiopathic pulmonary fibrosis (HCC) Under the care of specialist they are in the process of getting a new medicine approved for her  5. DOE (dyspnea on exertion) We did discuss how to pace herself  6. Chronic lymphocytic leukemia (HCC) White blood count minimally elevated monitor warning signs of infection discussed follow-up with oncology by January  Thyroid function looks good continue current measures Metabolic 7 liver function all look good  She also has peripheral neuropathy in her feet this could be related to her other diseases that could be related to medications doubt that this is related to diabetes.

## 2023-04-30 ENCOUNTER — Telehealth: Payer: Self-pay

## 2023-04-30 ENCOUNTER — Other Ambulatory Visit: Payer: Self-pay | Admitting: Family Medicine

## 2023-04-30 DIAGNOSIS — R300 Dysuria: Secondary | ICD-10-CM

## 2023-04-30 LAB — POCT URINALYSIS DIP (CLINITEK)
Bilirubin, UA: NEGATIVE
Blood, UA: NEGATIVE
Glucose, UA: NEGATIVE mg/dL
Ketones, POC UA: NEGATIVE mg/dL
Nitrite, UA: POSITIVE — AB
POC PROTEIN,UA: 30 — AB
Spec Grav, UA: 1.025 (ref 1.010–1.025)
Urobilinogen, UA: 0.2 E.U./dL
pH, UA: 6 (ref 5.0–8.0)

## 2023-04-30 MED ORDER — CEPHALEXIN 500 MG PO CAPS: 500.0000 mg | ORAL_CAPSULE | Freq: Two times a day (BID) | ORAL | 0 refills | Status: DC

## 2023-04-30 NOTE — Telephone Encounter (Signed)
Patient dropped urine off today , results in the system , she was unable to void on 04/28/23 appt. Will send for urine culture as directed.

## 2023-04-30 NOTE — Addendum Note (Signed)
Addended by: Alm Bustard R on: 04/30/2023 03:10 PM   Modules accepted: Orders

## 2023-04-30 NOTE — Telephone Encounter (Deleted)
Patient dropped off urine today , results in the system

## 2023-04-30 NOTE — Telephone Encounter (Signed)
I communicated to the patient she will pick up the prescription in the morning and notify us if any ongoing troubles

## 2023-05-03 LAB — URINE CULTURE

## 2023-05-04 LAB — URINE CULTURE

## 2023-05-05 ENCOUNTER — Other Ambulatory Visit: Payer: Self-pay | Admitting: Family Medicine

## 2023-05-05 LAB — URINE CULTURE

## 2023-05-05 LAB — SPECIMEN STATUS REPORT

## 2023-05-10 NOTE — Telephone Encounter (Signed)
Returned call to patient regarding Ofev application. She states she has lost her Ofev paperwork when she went out of town. Requested application be mailed to her home (confirmed address on file is correct for shipping)  Chesley Mires, PharmD, MPH, BCPS, CPP Clinical Pharmacist (Rheumatology and Pulmonology)

## 2023-05-10 NOTE — Telephone Encounter (Signed)
Pt calling in bc she is missing some paperwork for her Ofev

## 2023-05-17 ENCOUNTER — Other Ambulatory Visit: Payer: Self-pay | Admitting: Family Medicine

## 2023-05-21 ENCOUNTER — Ambulatory Visit
Admission: EM | Admit: 2023-05-21 | Discharge: 2023-05-21 | Disposition: A | Discharge status: Home / Self Care | Payer: PPO | Attending: Nurse Practitioner | Admitting: Nurse Practitioner

## 2023-05-21 DIAGNOSIS — M79641 Pain in right hand: Secondary | ICD-10-CM

## 2023-05-21 MED ORDER — DICLOFENAC SODIUM 1 % EX GEL: 2.0000 g | Freq: Four times a day (QID) | CUTANEOUS | 0 refills | Status: DC

## 2023-05-21 NOTE — Discharge Instructions (Signed)
I wonder if the pain in your hand is coming from arthritis.  Please use the diclofenac gel to help with the pain.  You can continue the Tylenol.  You can also try to keep your arm up and elevated today and apply ice 15 minutes on, 45 minutes off or warm compress 15 minutes on, 45 minutes off every hour while awake.  Follow-up with PCP for persistent/worsening symptoms despite treatment.

## 2023-05-21 NOTE — ED Provider Notes (Signed)
[facility]    CSN: [id number] Arrival date & time: 05/21/23  1159      History   Chief Complaint No chief complaint on file.   HPI [name] is a 87 y.o. female.   Patient presents today for right hand pain that began this morning when she woke up.  She denies recent fall, accident, trauma, or injury involving her right hand.  She reports that hand is painful to move it or to touch it.  It is painful along the top of her hand and not in her fingertips.  The pain is constant, worse with movement.  Pain does not radiate to the fingertips to the wrist.  No weakness, numbness or tingling, redness, swelling, bruising, or fevers, nausea/vomiting.  She reports is difficult to make a fist.  Has taken Tylenol which she takes daily for chronic pain without much benefit.    Past Medical History:  Diagnosis Date   Chronic Leukemia    Depression    H/O hiatal hernia    Hernia    Hernia    right inguinal hernia   Hypertension    Hypothyroidism    Leg swelling    Osteitis pubis (HCC) 05/30/2001   Osteopenia 03/30/2009   Osteopenia 07/30/2022   Bone density 2021 showed elevated FRAX patient was started on Fosamax   Thyroid disease     Patient Active Problem List   Diagnosis Date Noted   Chronic diastolic (congestive) heart failure (HCC) 01/28/2023   Lymphoproliferative disease (HCC) 01/28/2023   Idiopathic pulmonary fibrosis (HCC) 08/18/2022   Osteopenia 07/30/2022   Persistent atrial fibrillation (HCC)    Chronic lymphocytic leukemia (HCC) 01/21/2022   Leukocytosis 09/05/2021   Dyspnea on exertion 05/24/2015   Bilateral leg edema 05/24/2015   Heme positive stool 08/13/2014   GERD (gastroesophageal reflux disease) 07/02/2014   Encounter for screening colonoscopy 07/02/2014   Hyperlipidemia 05/16/2013   Hypothyroidism 05/16/2013   Essential hypertension, benign 05/16/2013   Inguinal hernia, right 04/27/2012    Past Surgical History:  Procedure Laterality  Date   ABDOMINAL HYSTERECTOMY  03/1970   APPENDECTOMY  03/1966   BILATERAL SALPINGOOPHORECTOMY     CARDIOVERSION N/A 03/10/2022   Procedure: CARDIOVERSION;  Surgeon: Thurmon Fair, MD;  Location: [facility];  Service: Cardiovascular;  Laterality: N/A;   CATARACT EXTRACTION W/PHACO Right 09/25/2014   Procedure: CATARACT EXTRACTION PHACO AND INTRAOCULAR LENS PLACEMENT (IOC);  Surgeon: Loraine Leriche T. Nile Riggs, MD;  Location: [facility];  Service: Ophthalmology;  Laterality: Right;  CDE:7.56   CATARACT EXTRACTION W/PHACO Left 10/09/2014   Procedure: CATARACT EXTRACTION PHACO AND INTRAOCULAR LENS PLACEMENT LEFT EYE CDE=6.51;  Surgeon: Loraine Leriche T. Nile Riggs, MD;  Location: [facility];  Service: Ophthalmology;  Laterality: Left;   CHOLECYSTECTOMY  1980   COLONOSCOPY  1999   COLONOSCOPY  March 2007   Repeat 10 years   COLONOSCOPY  11/06/2003    Internal and external hemorrhoids/Two tiny rectal distal sigmoid polyps cold biopsied/Otherwise within normal limits to the cecum   COLONOSCOPY N/A 08/29/2014   Dr. Jena Gauss: Sessile polyp was found at the cecum status post cold snare polypectomy, tubular adenoma. No further surveillance unless clinical status changes   ESOPHAGOGASTRODUODENOSCOPY  2007   Dr. Jena Gauss: normal esophagus, small tom oderate sized hiatal hernia, Cameron lesions   SKIN CANCER EXCISION Left 11 1 2016   left arm squamous cancer   TUMOR REMOVAL  03/1968   ovarian    OB History   No obstetric history on file.  Home Medications    Prior to Admission medications   Medication Sig Start Date End Date Taking? Authorizing Provider  diclofenac Sodium (VOLTAREN) 1 % GEL Apply 2 g topically 4 (four) times daily. 05/21/23  Yes Valentino Nose, NP  acetaminophen (TYLENOL) 650 MG CR tablet Take 650 mg by mouth every 8 (eight) hours as needed for pain.    [provider]  albuterol (VENTOLIN HFA) 108 (90 Base) MCG/ACT inhaler INHALE 2 PUFFS INTO THE LUNGS EVERY 6 HOURS AS NEEDED FOR WHEEZING OR  SHORTNESS OF BREATH 11/26/22   Babs Sciara, MD  alendronate (FOSAMAX) 70 MG tablet TAKE 1 TABLET(70 MG) BY MOUTH EVERY 7 DAYS WITH A FULL GLASS OF WATER AND ON AN EMPTY STOMACH 03/24/23   Babs Sciara, MD  ALPRAZolam (XANAX) 0.5 MG tablet TAKE 1 TABLET BY MOUTH EVERY NIGHT AT BEDTIME. MAY TAKE 1/2 TABLET DURING THE DAY AS NEEDED 05/18/23   Babs Sciara, MD  apixaban (ELIQUIS) 5 MG TABS tablet Take 1 tablet (5 mg total) by mouth 2 (two) times daily. Pt DUE for FOLLOW-UP, MUST see PROVIDER for FUTURE refills. 03/30/23   [name], Mihai, MD  Cholecalciferol (VITAMIN D3) 2000 UNITS TABS Take 4,000 Units by mouth daily.    [provider]  indapamide (LOZOL) 1.25 MG tablet TAKE 1 TABLET BY MOUTH EVERY MORNING 12/25/22   Babs Sciara, MD  levothyroxine (SYNTHROID) 125 MCG tablet TAKE 1 TABLET BY MOUTH DAILY 07/17/22   Babs Sciara, MD  metoprolol succinate (TOPROL-XL) 25 MG 24 hr tablet TAKE 1 TABLET BY MOUTH DAILY 12/25/22   Babs Sciara, MD  Multiple Vitamin (MULTI-VITAMIN DAILY PO) Take 1 tablet by mouth daily.    [provider]  pantoprazole (PROTONIX) 40 MG tablet TAKE 1 TABLET(40 MG) BY MOUTH DAILY 05/06/23   Babs Sciara, MD  potassium chloride (KLOR-CON M) 10 MEQ tablet TAKE 1 TABLET(10 MEQ) BY MOUTH DAILY 06/16/22   Babs Sciara, MD  sertraline (ZOLOFT) 100 MG tablet 1 qd 01/28/23   Babs Sciara, MD    Family History Family History  Problem Relation Age of Onset   Dementia Mother    Heart disease Father    Cancer Father        throat   Cancer Sister        breast   Colon cancer Neg Hx    Lung disease Neg Hx    Autoimmune disease Neg Hx     Social History Social History   Tobacco Use   Smoking status: Never   Smokeless tobacco: Never   Tobacco comments:    Never smoked  Substance Use Topics   Alcohol use: Yes    Alcohol/week: 0.0 standard drinks of alcohol    Comment: occasional margarita   Drug use: No     Allergies   Erythromycin,  Aspirin, and Ibuprofen   Review of Systems Review of Systems Per HPI  Physical Exam Triage Vital Signs ED Triage Vitals  Enc Vitals Group     BP 05/21/23 1302 (!) 166/93     Pulse Rate 05/21/23 1302 66     Resp 05/21/23 1302 16     Temp 05/21/23 1302 97.8 F (36.6 C)     Temp Source 05/21/23 1302 Oral     SpO2 05/21/23 1302 97 %     Weight --      Height --      Head Circumference --  Peak Flow --      Pain Score 05/21/23 1305 9     Pain Loc --      Pain Edu? --      Excl. in GC? --    No data found.  Updated Vital Signs BP (!) 166/93 (BP Location: Right Arm)   Pulse 66   Temp 97.8 F (36.6 C) (Oral)   Resp 16   SpO2 97%   Visual Acuity Right Eye Distance:   Left Eye Distance:   Bilateral Distance:    Right Eye Near:   Left Eye Near:    Bilateral Near:     Physical Exam Vitals and nursing note reviewed.  Constitutional:      General: She is not in acute distress.    Appearance: Normal appearance. She is not toxic-appearing.  HENT:     Mouth/Throat:     Mouth: Mucous membranes are moist.     Pharynx: Oropharynx is clear.  Pulmonary:     Effort: Pulmonary effort is normal. No respiratory distress.  Musculoskeletal:     Comments: Inspection: No swelling, obvious deformity, redness, or bruising to the right hand or wrist Palpation: Right dorsal hand diffusely tender to palpation; no obvious deformities palpated.  No snuffbox tenderness; no tenderness to palpation of the wrist and no obvious deformities palpated ROM: Full ROM right hand and wrist, however patient unable to make a complete fist Strength: 5/5 bilateral upper extremities Neurovascular: neurovascularly intact in left and right upper extremity   Skin:    General: Skin is warm and dry.     Capillary Refill: Capillary refill takes less than 2 seconds.     Coloration: Skin is not jaundiced or pale.     Findings: No erythema.  Neurological:     Mental Status: She is alert and oriented to  person, place, and time.  Psychiatric:        Behavior: Behavior is cooperative.      UC Treatments / Results  Labs (all labs ordered are listed, but only abnormal results are displayed) Labs Reviewed - No data to display  EKG   Radiology No results found.  Procedures Procedures (including critical care time)  Medications Ordered in UC Medications - No data to display  Initial Impression / Assessment and Plan / UC Course  I have reviewed the triage vital signs and the nursing notes.  Pertinent labs & imaging results that were available during my care of the patient were reviewed by me and considered in my medical decision making (see chart for details).   Patient is well-appearing, afebrile, not tachycardic, not tachypneic, oxygenating well on room air.  Patient is mildly hypertensive today in urgent care.  1. Right hand pain Suspect arthritis X-ray imaging deferred given no injury note Treat with diclofenac gel, ice, elevation Follow-up with PCP with no improvement in symptoms despite treatment  The patient was given the opportunity to ask questions.  All questions answered to their satisfaction.  The patient is in agreement to this plan.    Final Clinical Impressions(s) / UC Diagnoses   Final diagnoses:  Right hand pain     Discharge Instructions      I wonder if the pain in your hand is coming from arthritis.  Please use the diclofenac gel to help with the pain.  You can continue the Tylenol.  You can also try to keep your arm up and elevated today and apply ice 15 minutes on, 45 minutes off or warm  compress 15 minutes on, 45 minutes off every hour while awake.  Follow-up with PCP for persistent/worsening symptoms despite treatment.    ED Prescriptions     Medication Sig Dispense Auth. Provider   diclofenac Sodium (VOLTAREN) 1 % GEL Apply 2 g topically 4 (four) times daily. 50 g Valentino Nose, NP      PDMP not reviewed this encounter.   Valentino Nose, NP 05/21/23 1332

## 2023-05-21 NOTE — ED Triage Notes (Signed)
Pt c/o right hand pain, pt states she woke up this morning and it was bothering her, she doesn't recall any injury to the right hand.

## 2023-05-24 NOTE — Telephone Encounter (Signed)
Spoke to patient, she stated that she received the application via mail. She is almost done filling it out and will mail back tomorrow.  [name], PharmD Student [employer]

## 2023-05-25 DIAGNOSIS — H524 Presbyopia: Secondary | ICD-10-CM | POA: Diagnosis not present

## 2023-05-25 DIAGNOSIS — H52203 Unspecified astigmatism, bilateral: Secondary | ICD-10-CM | POA: Diagnosis not present

## 2023-05-25 DIAGNOSIS — H5213 Myopia, bilateral: Secondary | ICD-10-CM | POA: Diagnosis not present

## 2023-05-25 DIAGNOSIS — Z961 Presence of intraocular lens: Secondary | ICD-10-CM | POA: Diagnosis not present

## 2023-06-01 ENCOUNTER — Telehealth: Payer: Self-pay | Admitting: Internal Medicine

## 2023-06-01 NOTE — Telephone Encounter (Signed)
Pt paperwork left in Pharmacy mailbox

## 2023-06-04 NOTE — Telephone Encounter (Signed)
Routing to Dr. Celine Mans as an [name].

## 2023-06-10 ENCOUNTER — Other Ambulatory Visit (HOSPITAL_COMMUNITY): Payer: Self-pay | Admitting: Family Medicine

## 2023-06-10 ENCOUNTER — Telehealth: Payer: Self-pay | Admitting: Student-PharmD

## 2023-06-10 DIAGNOSIS — J84112 Idiopathic pulmonary fibrosis: Secondary | ICD-10-CM

## 2023-06-10 DIAGNOSIS — Z1231 Encounter for screening mammogram for malignant neoplasm of breast: Secondary | ICD-10-CM

## 2023-06-10 MED ORDER — OFEV 150 MG PO CAPS
150.0000 mg | ORAL_CAPSULE | Freq: Two times a day (BID) | ORAL | Status: DC
Start: 2023-06-10 — End: 2023-10-21

## 2023-06-10 NOTE — Telephone Encounter (Signed)
Received a fax from  [facility] regarding an approval for OFEV patient assistance from 06/10/23 to 11/30/23. Approval letter sent to scan center.  Phone #: [phone] Fax #: [phone]  Chesley Mires, PharmD, MPH, BCPS, CPP Clinical Pharmacist (Rheumatology and Pulmonology)

## 2023-06-10 NOTE — Telephone Encounter (Signed)
Submitted Patient Assistance Application to [facility] for OFEV along with provider portion, pt portion, med list, PA and income documents. Will update patient when we receive a response.  Phone #: [phone] Fax #: [phone]  Chesley Mires, PharmD, MPH, BCPS, CPP Clinical Pharmacist (Rheumatology and Pulmonology)

## 2023-06-10 NOTE — Telephone Encounter (Signed)
Subjective:  Patient called today by [name] for Ofev new start counseling.   Patient was last seen by [name] on 04/08/2023. Pertinent past medical history includes ILD, HTN, HLD, Afib, CHF, chronic leukemia, hypothyroidism.  History of CAD: Yes - Heart failure History of MI: No Current anticoagulant use: Yes History of HTN: Yes  History of elevated LFTs: No History of diarrhea, nausea, vomiting: Yes - nausea  Objective: Allergies  Allergen Reactions   Erythromycin Shortness Of Breath   Aspirin Other (See Comments)    Caused internal bleeding. Patient had ulcer at the time. Instructed by PCP - Marca Ancona not to take any longer.   Ibuprofen     Instructed not to take by PCP - Marca Ancona due to reaction to aspirin as well (precautionary).    Outpatient Encounter Medications as of 06/10/2023  Medication Sig   acetaminophen (TYLENOL) 650 MG CR tablet Take 650 mg by mouth every 8 (eight) hours as needed for pain.   albuterol (VENTOLIN HFA) 108 (90 Base) MCG/ACT inhaler INHALE 2 PUFFS INTO THE LUNGS EVERY 6 HOURS AS NEEDED FOR WHEEZING OR SHORTNESS OF BREATH   alendronate (FOSAMAX) 70 MG tablet TAKE 1 TABLET(70 MG) BY MOUTH EVERY 7 DAYS WITH A FULL GLASS OF WATER AND ON AN EMPTY STOMACH   ALPRAZolam (XANAX) 0.5 MG tablet TAKE 1 TABLET BY MOUTH EVERY NIGHT AT BEDTIME. MAY TAKE 1/2 TABLET DURING THE DAY AS NEEDED   apixaban (ELIQUIS) 5 MG TABS tablet Take 1 tablet (5 mg total) by mouth 2 (two) times daily. Pt DUE for FOLLOW-UP, MUST see PROVIDER for FUTURE refills.   Cholecalciferol (VITAMIN D3) 2000 UNITS TABS Take 4,000 Units by mouth daily.   diclofenac Sodium (VOLTAREN) 1 % GEL Apply 2 g topically 4 (four) times daily.   indapamide (LOZOL) 1.25 MG tablet TAKE 1 TABLET BY MOUTH EVERY MORNING   levothyroxine (SYNTHROID) 125 MCG tablet TAKE 1 TABLET BY MOUTH DAILY   metoprolol succinate (TOPROL-XL) 25 MG 24 hr tablet TAKE 1 TABLET BY MOUTH DAILY   Multiple  Vitamin (MULTI-VITAMIN DAILY PO) Take 1 tablet by mouth daily.   pantoprazole (PROTONIX) 40 MG tablet TAKE 1 TABLET(40 MG) BY MOUTH DAILY   potassium chloride (KLOR-CON M) 10 MEQ tablet TAKE 1 TABLET(10 MEQ) BY MOUTH DAILY   sertraline (ZOLOFT) 100 MG tablet 1 qd   No facility-administered encounter medications on file as of 06/10/2023.     Immunization History  Administered Date(s) Administered   Fluad Quad(high Dose 65+) 09/24/2020, 09/04/2022   Influenza, High Dose Seasonal PF 08/30/2021   Influenza,inj,Quad PF,6+ Mos 09/06/2018   Influenza-Unspecified 08/31/2015, 08/06/2016, 09/16/2017, 10/19/2019   PFIZER(Purple Top)SARS-COV-2 Vaccination 12/04/2019, 12/26/2019   Pneumococcal Conjugate-13 06/19/2014   Pneumococcal Polysaccharide-23 10/05/2017   Zoster Recombinant(Shingrix) 01/05/2023   Zoster, Live 10/05/2012      PFT's TLC  Date Value Ref Range Status  02/26/2022 2.89 L Final      CMP     Component Value Date/Time   NA 138 04/14/2023 1112   K 4.1 04/14/2023 1112   CL 98 04/14/2023 1112   CO2 26 04/14/2023 1112   GLUCOSE 98 04/14/2023 1112   GLUCOSE 87 04/08/2023 1605   BUN 15 04/14/2023 1112   CREATININE 0.98 04/14/2023 1112   CREATININE 0.87 12/01/2022 1217   CREATININE 0.78 06/12/2014 0950   CALCIUM 9.3 04/14/2023 1112   PROT 7.6 04/08/2023 1605   PROT 6.7 04/07/2022 1413   ALBUMIN 4.1 04/08/2023 1605  ALBUMIN 4.3 01/07/2022 1204   AST 20 04/08/2023 1605   AST 18 12/01/2022 1217   ALT 11 04/08/2023 1605   ALT 11 12/01/2022 1217   ALKPHOS 60 04/08/2023 1605   BILITOT 0.5 04/08/2023 1605   BILITOT 0.7 12/01/2022 1217   GFRNONAA >60 12/01/2022 1217   GFRAA 66 10/15/2020 1052      CBC    Component Value Date/Time   WBC 11.6 (H) 12/01/2022 1204   WBC 11.0 (H) 02/02/2022 1217   RBC 4.13 12/01/2022 1204   HGB 12.4 12/01/2022 1204   HGB 11.4 10/09/2022 1644   HCT 36.8 12/01/2022 1204   HCT 35.0 10/09/2022 1644   PLT 352 12/01/2022 1204   PLT 380  10/09/2022 1644   MCV 89.1 12/01/2022 1204   MCV 86 10/09/2022 1644   MCH 30.0 12/01/2022 1204   MCHC 33.7 12/01/2022 1204   RDW 13.7 12/01/2022 1204   RDW 14.0 10/09/2022 1644   LYMPHSABS 4.8 (H) 12/01/2022 1204   LYMPHSABS 8.3 (H) 10/09/2022 1644   MONOABS 0.9 12/01/2022 1204   EOSABS 0.2 12/01/2022 1204   EOSABS 0.2 10/09/2022 1644   BASOSABS 0.0 12/01/2022 1204   BASOSABS 0.1 10/09/2022 1644      LFT's    Latest Ref Rng & Units 04/08/2023    4:05 PM 12/01/2022   12:17 PM 04/07/2022    2:13 PM  Hepatic Function  Total Protein 6.0 - 8.3 g/dL 7.6  7.3  6.7   Albumin 3.5 - 5.2 g/dL 4.1  4.0    AST 0 - 37 U/L 20  18    ALT 0 - 35 U/L 11  11    Alk Phosphatase 39 - 117 U/L 60  51    Total Bilirubin 0.2 - 1.2 mg/dL 0.5  0.7        HRCT (1. Subpleural predominant reticulations with associated traction bronchiectasis but no honeycomb change, most consistent with probable UIP. Can not evaluate for air trapping since expiratory phase images are technically suboptimal and fibrotic HP is also a consideration. No definite progression when compared with January 26, 2022 prior. Findings are categorized as probable UIP per consensus guidelines: Diagnosis of Idiopathic Pulmonary Fibrosis: An Official ATS/ERS/JRS/ALAT Clinical Practice Guideline. Am Rosezetta Schlatter Crit Care Med Vol 198, Iss 5, [phone], Jul 31 2017. 2. Aortic Atherosclerosis (ICD10-I70.0).)  Assessment and Plan  Ofev Medication Management Thoroughly counseled patient on the efficacy, mechanism of action, dosing, administration, adverse effects, and monitoring parameters of Ofev. Patient verbalized understanding.  Goals of Therapy: Will not stop or reverse the progression of ILD. It will slow the progression of ILD.  Inhibits tyrosine kinase inhibitors which slow the fibrosis/progression of ILD -Significant reduction in the rate of disease progression was observed after treatment (61.1% [before] vs 33.3% [after], P?=?0.008)  over 42 weeks.  Dosing: 150 mg (one capsule) by mouth twice daily (approx 12 hours apart). Discussed taking with food approximately 12 hours apart. Discussed that capsule should not be crushed or split.  Adverse Effects: Nausea, vomiting, diarrhea (2 in 3 patients) appetite loss, weight loss - management of diarrhea with loperamide discussed including max use of 48 hours and max of 8 capsules per day. Abdominal pain (up to 1 in 5 patients) Nasopharyngitis (13%), UTI (6%) Risk of thrombosis (3%) and acute MI (2%) Hypertension (5%) Dizziness Fatigue (10%)  Monitoring: Monitor for diarrhea, nausea and vomiting, GI perforation, hepatotoxicity  Monitor LFTs - baseline, monthly for first 6 months, then every 3 months routinely  Pregnancy test - baseline prn CBC w differential at baseline and every 3 months routinely  Access: Approval of Ofev through: patient assistance Rx sent to: [facility] for Ofev: [phone]  Medication Reconciliation A drug regimen assessment was performed, including review of allergies, interactions, disease-state management, dosing and immunization history. Medications were reviewed with the patient, including name, instructions, indication, goals of therapy, potential side effects, importance of adherence, and safe use.  Anticoagulant use: Yes - Eliquis for afib  Immunizations Patient is indicated for the influenzae, pneumonia, and shingles vaccinations. Patient has received "every recommended" COVID19 vaccine.  This appointment required 10 minutes of patient care (this includes precharting, chart review, review of results, face-to-face care, etc.).  Thank you for involving pharmacy to assist in providing this patient's care.  Rickey Primus, PharmD Candidate [employer] Class [employer] [phone]

## 2023-06-18 ENCOUNTER — Encounter (HOSPITAL_COMMUNITY): Payer: Self-pay

## 2023-06-18 ENCOUNTER — Ambulatory Visit (HOSPITAL_COMMUNITY)
Admission: RE | Admit: 2023-06-18 | Discharge: 2023-06-18 | Disposition: A | Discharge status: Home / Self Care | Payer: PPO | Source: Ambulatory Visit | Attending: Family Medicine | Admitting: Family Medicine

## 2023-06-18 DIAGNOSIS — Z1231 Encounter for screening mammogram for malignant neoplasm of breast: Secondary | ICD-10-CM | POA: Insufficient documentation

## 2023-06-19 ENCOUNTER — Other Ambulatory Visit: Payer: Self-pay | Admitting: Family Medicine

## 2023-06-24 ENCOUNTER — Telehealth: Payer: Self-pay | Admitting: Pharmacist

## 2023-06-24 NOTE — Telephone Encounter (Signed)
Received VM from patient stating that she did not receive medication from [facility]. She had mixed up the [facility] and office numbers. She verified that she had [facility] # and will follow-up with them to schedule shipment  Chesley Mires, PharmD, MPH, BCPS, CPP Clinical Pharmacist (Rheumatology and Pulmonology)

## 2023-06-28 DIAGNOSIS — C44321 Squamous cell carcinoma of skin of nose: Secondary | ICD-10-CM | POA: Diagnosis not present

## 2023-06-29 ENCOUNTER — Telehealth: Payer: Self-pay | Admitting: Internal Medicine

## 2023-06-29 NOTE — Telephone Encounter (Signed)
Patient received medication Ofev. Patient would like to know if she needs medication for side effects. Patient phone number is [phone].

## 2023-06-30 NOTE — Telephone Encounter (Signed)
Returned call to patient - she already has loperamide at home. States she is more concerned about nausea. Recommended she call our clinic if any significant nausea. She may also hold the Ofev for 1-2 weeks to allow diarrhea/nausea to self-resolve.  She voiced understanding. Nothing further should be needed  Chesley Mires, PharmD, MPH, BCPS, CPP Clinical Pharmacist (Rheumatology and Pulmonology)

## 2023-07-01 ENCOUNTER — Other Ambulatory Visit: Payer: Self-pay | Admitting: Family Medicine

## 2023-07-02 ENCOUNTER — Ambulatory Visit (INDEPENDENT_AMBULATORY_CARE_PROVIDER_SITE_OTHER): Payer: PPO

## 2023-07-02 VITALS — BP 166/93 | Ht 65.5 in | Wt 185.0 lb

## 2023-07-02 DIAGNOSIS — Z0000 Encounter for general adult medical examination without abnormal findings: Secondary | ICD-10-CM

## 2023-07-02 NOTE — Telephone Encounter (Signed)
Please call the pharmacy and let them know we put her on the 100 mg Zoloft so therefore the request for 50 mg is inappropriate and they need to focus on the 100 mg

## 2023-07-02 NOTE — Patient Instructions (Signed)
[name] , Thank you for taking time to come for your Medicare Wellness Visit. I appreciate your ongoing commitment to your health goals. Please review the following plan we discussed and let me know if I can assist you in the future.   These are the goals we discussed:  Goals      DIET - REDUCE SUGAR INTAKE     Patient Stated     Patient states her goal is to feel better     Prevent falls     Weight (lb) < 175 lb (79.4 kg)        This is a list of the screening recommended for you and due dates:  Health Maintenance  Topic Date Due   DTaP/Tdap/Td vaccine (1 - Tdap) Never done   COVID-19 Vaccine (3 - Pfizer risk series) 01/23/2020   Zoster (Shingles) Vaccine (2 of 2) 03/02/2023   Flu Shot  07/01/2023   Medicare Annual Wellness Visit  07/01/2024   Pneumonia Vaccine  Completed   DEXA scan (bone density measurement)  Completed   HPV Vaccine  Aged Out    Advanced directives:  Advance directive discussed with you today. Even though you declined this today, please call our office should you change your mind, and we can give you the proper paperwork for you to fill out. Advance care planning is a way to make decisions about medical care that fits your values in case you are ever unable to make these decisions for yourself.  Information on Advanced Care Planning can be found at [facility] Advance Health Care Directives Advance Health Care Directives ([email or web address]    Conditions/risks identified:  You are due for the vaccines checked below. You may have these done at your preferred pharmacy. Please have them fax the office proof of the vaccines so that we can update your chart.   [x]  Flu (due annually) [x]  Shingrix (Shingles vaccine) []  Pneumonia Vaccines [x]  TDAP (Tetanus) Vaccine every 10 years [x]  Covid-19  Understanding Your Risk for Falls Millions of people have serious injuries from falls each year. It is important to understand your risk of falling. Talk  with your health care provider about your risk and what you can do to lower it. If you do have a serious fall, make sure to tell your provider. Falling once raises your risk of falling again. How can falls affect me? Serious injuries from falls are common. These include: Broken bones, such as hip fractures. Head injuries, such as traumatic brain injuries (TBI) or concussions. A fear of falling can cause you to avoid activities and stay at home. This can make your muscles weaker and raise your risk for a fall. What can increase my risk? There are a number of risk factors that increase your risk for falling. The more risk factors you have, the higher your risk of falling. Serious injuries from a fall happen most often to people who are older than 85 years old. Teenagers and young adults ages 100-29 are also at higher risk. Common risk factors include: Weakness in the lower body. Being generally weak or confused due to long-term (chronic) illness. Dizziness or balance problems. Poor vision. Medicines that cause dizziness or drowsiness. These may include: Medicines for your blood pressure, heart, anxiety, insomnia, or swelling (edema). Pain medicines. Muscle relaxants. Other risk factors include: Drinking alcohol. Having had a fall in the past. Having foot pain or wearing improper footwear. Working at a dangerous job. Having any of the following  in your home: Tripping hazards, such as floor clutter or loose rugs. Poor lighting. Pets. Having dementia or memory loss. What actions can I take to lower my risk of falling?     Physical activity Stay physically fit. Do strength and balance exercises. Consider taking a regular class to build strength and balance. Yoga and tai chi are good options. Vision Have your eyes checked every year and your prescription for glasses or contacts updated as needed. Shoes and walking aids Wear non-skid shoes. Wear shoes that have rubber soles and low  heels. Do not wear high heels. Do not walk around the house in socks or slippers. Use a cane or walker as told by your provider. Home safety Attach secure railings on both sides of your stairs. Install grab bars for your bathtub, shower, and toilet. Use a non-skid mat in your bathtub or shower. Attach bath mats securely with double-sided, non-slip rug tape. Use good lighting in all rooms. Keep a flashlight near your bed. Make sure there is a clear path from your bed to the bathroom. Use night-lights. Do not use throw rugs. Make sure all carpeting is taped or tacked down securely. Remove all clutter from walkways and stairways, including extension cords. Repair uneven or broken steps and floors. Avoid walking on icy or slippery surfaces. Walk on the grass instead of on icy or slick sidewalks. Use ice melter to get rid of ice on walkways in the winter. Use a cordless phone. Questions to ask your health care provider Can you help me check my risk for a fall? Do any of my medicines make me more likely to fall? Should I take a vitamin D supplement? What exercises can I do to improve my strength and balance? Should I make an appointment to have my vision checked? Do I need a bone density test to check for weak bones (osteoporosis)? Would it help to use a cane or a walker? Where to find more information Centers for Disease Control and Prevention, STEADI: TonerPromos.no Community-Based Fall Prevention Programs: TonerPromos.no [employer] on Aging: BaseRingTones.pl Contact a health care provider if: You fall at home. You are afraid of falling at home. You feel weak, drowsy, or dizzy. This information is not intended to replace advice given to you by your health care provider. Make sure you discuss any questions you have with your health care provider. Document Revised: 07/20/2022 Document Reviewed: 07/20/2022 Elsevier Patient Education  2024 Elsevier Inc.  Next appointment: VIRTUAL/TELEPHONE  APPOINTMENT Follow up in one year for your annual wellness visit  July 07, 2024 at 1:00 pm telephone visit   Preventive Care 65 Years and Older, Female Preventive care refers to lifestyle choices and visits with your health care provider that can promote health and wellness. What does preventive care include? A yearly physical exam. This is also called an annual well check. Dental exams once or twice a year. Routine eye exams. Ask your health care provider how often you should have your eyes checked. Personal lifestyle choices, including: Daily care of your teeth and gums. Regular physical activity. Eating a healthy diet. Avoiding tobacco and drug use. Limiting alcohol use. Practicing safe sex. Taking low-dose aspirin every day. Taking vitamin and mineral supplements as recommended by your health care provider. What happens during an annual well check? The services and screenings done by your health care provider during your annual well check will depend on your age, overall health, lifestyle risk factors, and family history of disease. Counseling  Your health  care provider may ask you questions about your: Alcohol use. Tobacco use. Drug use. Emotional well-being. Home and relationship well-being. Sexual activity. Eating habits. History of falls. Memory and ability to understand (cognition). Work and work Astronomer. Reproductive health. Screening  You may have the following tests or measurements: Height, weight, and BMI. Blood pressure. Lipid and cholesterol levels. These may be checked every 5 years, or more frequently if you are over 26 years old. Skin check. Lung cancer screening. You may have this screening every year starting at age 69 if you have a 30-pack-year history of smoking and currently smoke or have quit within the past 15 years. Fecal occult blood test (FOBT) of the stool. You may have this test every year starting at age 66. Flexible sigmoidoscopy or  colonoscopy. You may have a sigmoidoscopy every 5 years or a colonoscopy every 10 years starting at age 35. Hepatitis C blood test. Hepatitis B blood test. Sexually transmitted disease (STD) testing. Diabetes screening. This is done by checking your blood sugar (glucose) after you have not eaten for a while (fasting). You may have this done every 1-3 years. Bone density scan. This is done to screen for osteoporosis. You may have this done starting at age 67. Mammogram. This may be done every 1-2 years. Talk to your health care provider about how often you should have regular mammograms. Talk with your health care provider about your test results, treatment options, and if necessary, the need for more tests. Vaccines  Your health care provider may recommend certain vaccines, such as: Influenza vaccine. This is recommended every year. Tetanus, diphtheria, and acellular pertussis (Tdap, Td) vaccine. You may need a Td booster every 10 years. Zoster vaccine. You may need this after age 39. Pneumococcal 13-valent conjugate (PCV13) vaccine. One dose is recommended after age 41. Pneumococcal polysaccharide (PPSV23) vaccine. One dose is recommended after age 7. Talk to your health care provider about which screenings and vaccines you need and how often you need them. This information is not intended to replace advice given to you by your health care provider. Make sure you discuss any questions you have with your health care provider. Document Released: 12/13/2015 Document Revised: 08/05/2016 Document Reviewed: 09/17/2015 Elsevier Interactive Patient Education  2017 [employer].  Fall Prevention in the Home Falls can cause injuries. They can happen to people of all ages. There are many things you can do to make your home safe and to help prevent falls. What can I do on the outside of my home? Regularly fix the edges of walkways and driveways and fix any cracks. Remove anything that might make you  trip as you walk through a door, such as a raised step or threshold. Trim any bushes or trees on the path to your home. Use bright outdoor lighting. Clear any walking paths of anything that might make someone trip, such as rocks or tools. Regularly check to see if handrails are loose or broken. Make sure that both sides of any steps have handrails. Any raised decks and porches should have guardrails on the edges. Have any leaves, snow, or ice cleared regularly. Use sand or salt on walking paths during winter. Clean up any spills in your garage right away. This includes oil or grease spills. What can I do in the bathroom? Use night lights. Install grab bars by the toilet and in the tub and shower. Do not use towel bars as grab bars. Use non-skid mats or decals in the tub or shower.  If you need to sit down in the shower, use a plastic, non-slip stool. Keep the floor dry. Clean up any water that spills on the floor as soon as it happens. Remove soap buildup in the tub or shower regularly. Attach bath mats securely with double-sided non-slip rug tape. Do not have throw rugs and other things on the floor that can make you trip. What can I do in the bedroom? Use night lights. Make sure that you have a light by your bed that is easy to reach. Do not use any sheets or blankets that are too big for your bed. They should not hang down onto the floor. Have a firm chair that has side arms. You can use this for support while you get dressed. Do not have throw rugs and other things on the floor that can make you trip. What can I do in the kitchen? Clean up any spills right away. Avoid walking on wet floors. Keep items that you use a lot in easy-to-reach places. If you need to reach something above you, use a strong step stool that has a grab bar. Keep electrical cords out of the way. Do not use floor polish or wax that makes floors slippery. If you must use wax, use non-skid floor wax. Do not have  throw rugs and other things on the floor that can make you trip. What can I do with my stairs? Do not leave any items on the stairs. Make sure that there are handrails on both sides of the stairs and use them. Fix handrails that are broken or loose. Make sure that handrails are as long as the stairways. Check any carpeting to make sure that it is firmly attached to the stairs. Fix any carpet that is loose or worn. Avoid having throw rugs at the top or bottom of the stairs. If you do have throw rugs, attach them to the floor with carpet tape. Make sure that you have a light switch at the top of the stairs and the bottom of the stairs. If you do not have them, ask someone to add them for you. What else can I do to help prevent falls? Wear shoes that: Do not have high heels. Have rubber bottoms. Are comfortable and fit you well. Are closed at the toe. Do not wear sandals. If you use a stepladder: Make sure that it is fully opened. Do not climb a closed stepladder. Make sure that both sides of the stepladder are locked into place. Ask someone to hold it for you, if possible. Clearly mark and make sure that you can see: Any grab bars or handrails. First and last steps. Where the edge of each step is. Use tools that help you move around (mobility aids) if they are needed. These include: Canes. Walkers. Scooters. Crutches. Turn on the lights when you go into a dark area. Replace any light bulbs as soon as they burn out. Set up your furniture so you have a clear path. Avoid moving your furniture around. If any of your floors are uneven, fix them. If there are any pets around you, be aware of where they are. Review your medicines with your doctor. Some medicines can make you feel dizzy. This can increase your chance of falling. Ask your doctor what other things that you can do to help prevent falls. This information is not intended to replace advice given to you by your health care provider.  Make sure you discuss any questions you have  with your health care provider. Document Released: 09/12/2009 Document Revised: 04/23/2016 Document Reviewed: 12/21/2014 Elsevier Interactive Patient Education  2017 [employer].

## 2023-07-02 NOTE — Progress Notes (Addendum)
Because this visit was a virtual/telehealth visit,  certain criteria was not obtained, such a blood pressure, CBG if patient is a diabetic, and timed up and go. Any medications not marked as "taking" was not mentioned during the medication reconciliation part of the visit. Any vitals not documented were not able to be obtained due to this being a telehealth visit. Vitals documented are verbally provided by the patient.  Per patient no change in vitals since last visit, unable to obtain new vitals due to telehealth visit.   Subjective:   [name] is a 87 y.o. female who presents for Medicare Annual (Subsequent) preventive examination.  Visit Complete: Virtual  I connected with  [name] on 07/02/23 by a audio enabled telemedicine application and verified that I am speaking with the correct person using two identifiers.  Patient Location: Home  Provider Location: Home Office  I discussed the limitations of evaluation and management by telemedicine. The patient expressed understanding and agreed to proceed.  Patient Medicare AWV questionnaire was completed by the patient on n/a; I have confirmed that all information answered by patient is correct and no changes since this date.  Review of Systems     Cardiac Risk Factors include: advanced age (>41men, >69 women);dyslipidemia;hypertension;obesity (BMI >30kg/m2);sedentary lifestyle;Other (see comment), Risk factor comments: respiratory disease     Objective:    Today's Vitals   07/02/23 1403  BP: (!) 166/93  Weight: 185 lb (83.9 kg)  Height: 5' 5.5" (1.664 m)   Body mass index is 30.32 kg/m.     07/02/2023    2:03 PM 06/09/2022    1:36 PM 03/10/2022    8:23 AM 10/06/2021   11:58 AM 09/05/2021   11:05 AM 05/27/2021    1:12 PM 10/09/2014    6:35 AM  Advanced Directives  Does Patient Have a Medical Advance Directive? No Yes No No No No No  Type of Furniture conservator/restorer;Living will       Copy of  Healthcare Power of Attorney in Chart?  No - copy requested       Would patient like information on creating a medical advance directive? No - Patient declined  No - Patient declined No - Patient declined No - Patient declined No - Patient declined No - patient declined information    Current Medications (verified) Outpatient Encounter Medications as of 07/02/2023  Medication Sig   acetaminophen (TYLENOL) 650 MG CR tablet Take 650 mg by mouth every 8 (eight) hours as needed for pain.   albuterol (VENTOLIN HFA) 108 (90 Base) MCG/ACT inhaler INHALE 2 PUFFS INTO THE LUNGS EVERY 6 HOURS AS NEEDED FOR WHEEZING OR SHORTNESS OF BREATH   alendronate (FOSAMAX) 70 MG tablet TAKE 1 TABLET(70 MG) BY MOUTH EVERY 7 DAYS WITH A FULL GLASS OF WATER AND ON AN EMPTY STOMACH   ALPRAZolam (XANAX) 0.5 MG tablet TAKE 1 TABLET BY MOUTH EVERY NIGHT AT BEDTIME. MAY TAKE 1/2 TABLET DURING THE DAY AS NEEDED   apixaban (ELIQUIS) 5 MG TABS tablet Take 1 tablet (5 mg total) by mouth 2 (two) times daily. Pt DUE for FOLLOW-UP, MUST see PROVIDER for FUTURE refills.   Cholecalciferol (VITAMIN D3) 2000 UNITS TABS Take 4,000 Units by mouth daily.   diclofenac Sodium (VOLTAREN) 1 % GEL Apply 2 g topically 4 (four) times daily.   indapamide (LOZOL) 1.25 MG tablet TAKE 1 TABLET BY MOUTH EVERY MORNING   levothyroxine (SYNTHROID) 125 MCG tablet TAKE 1 TABLET BY  MOUTH DAILY   metoprolol succinate (TOPROL-XL) 25 MG 24 hr tablet TAKE 1 TABLET BY MOUTH DAILY   Multiple Vitamin (MULTI-VITAMIN DAILY PO) Take 1 tablet by mouth daily.   Nintedanib (OFEV) 150 MG CAPS Take 1 capsule (150 mg total) by mouth 2 (two) times daily.   pantoprazole (PROTONIX) 40 MG tablet TAKE 1 TABLET(40 MG) BY MOUTH DAILY   potassium chloride (KLOR-CON M) 10 MEQ tablet TAKE 1 TABLET(10 MEQ) BY MOUTH DAILY   sertraline (ZOLOFT) 100 MG tablet 1 qd   No facility-administered encounter medications on file as of 07/02/2023.    Allergies (verified) Erythromycin, Aspirin,  and Ibuprofen   History: Past Medical History:  Diagnosis Date   Chronic Leukemia    Depression    H/O hiatal hernia    Hernia    Hernia    right inguinal hernia   Hypertension    Hypothyroidism    Leg swelling    Osteitis pubis (HCC) 05/30/2001   Osteopenia 03/30/2009   Osteopenia 07/30/2022   Bone density 2021 showed elevated FRAX patient was started on Fosamax   Thyroid disease    Past Surgical History:  Procedure Laterality Date   ABDOMINAL HYSTERECTOMY  03/1970   APPENDECTOMY  03/1966   BILATERAL SALPINGOOPHORECTOMY     CARDIOVERSION N/A 03/10/2022   Procedure: CARDIOVERSION;  Surgeon: Thurmon Fair, MD;  Location: [facility];  Service: Cardiovascular;  Laterality: N/A;   CATARACT EXTRACTION W/PHACO Right 09/25/2014   Procedure: CATARACT EXTRACTION PHACO AND INTRAOCULAR LENS PLACEMENT (IOC);  Surgeon: Loraine Leriche T. Nile Riggs, MD;  Location: [facility];  Service: Ophthalmology;  Laterality: Right;  CDE:7.56   CATARACT EXTRACTION W/PHACO Left 10/09/2014   Procedure: CATARACT EXTRACTION PHACO AND INTRAOCULAR LENS PLACEMENT LEFT EYE CDE=6.51;  Surgeon: Loraine Leriche T. Nile Riggs, MD;  Location: [facility];  Service: Ophthalmology;  Laterality: Left;   CHOLECYSTECTOMY  1980   COLONOSCOPY  1999   COLONOSCOPY  March 2007   Repeat 10 years   COLONOSCOPY  11/06/2003    Internal and external hemorrhoids/Two tiny rectal distal sigmoid polyps cold biopsied/Otherwise within normal limits to the cecum   COLONOSCOPY N/A 08/29/2014   Dr. Jena Gauss: Sessile polyp was found at the cecum status post cold snare polypectomy, tubular adenoma. No further surveillance unless clinical status changes   ESOPHAGOGASTRODUODENOSCOPY  2007   Dr. Jena Gauss: normal esophagus, small tom oderate sized hiatal hernia, Cameron lesions   SKIN CANCER EXCISION Left 11 1 2016   left arm squamous cancer   TUMOR REMOVAL  03/1968   ovarian   Family History  Problem Relation Age of Onset   Dementia Mother    Heart disease Father    Cancer  Father        throat   Cancer Sister        breast   Colon cancer Neg Hx    Lung disease Neg Hx    Autoimmune disease Neg Hx    Social History   Socioeconomic History   Marital status: Widowed    Spouse name: Not on file   Number of children: 1   Years of education: 43   Highest education level: Not on file  Occupational History   Occupation: Retired    Associate Professor: RETIRED    Comment: [employer], 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   Widowed since 07/23/2021. Husband W.E. Colleran, died from Kidney Cancer.   Son-Tommy lives nearby.   2 grandson's deceased, Clint and Apolinar Junes. Apolinar Junes passed 02/2022 from Brain Cancer.    Social Determinants of Health   Financial Resource Strain: Low Risk  (07/02/2023)   Overall Financial Resource Strain (CARDIA)    Difficulty of Paying Living Expenses: Not hard at all  Food Insecurity: No Food Insecurity (07/02/2023)   Hunger Vital Sign    Worried About Running Out of Food in the Last Year: Never true    Ran Out of Food in the Last Year: Never true  Transportation Needs: No Transportation Needs (07/02/2023)   PRAPARE - Administrator, Civil Service (Medical): No    Lack of Transportation (Non-Medical): No  Physical Activity: Insufficiently Active (07/02/2023)   Exercise Vital Sign    Days of Exercise per Week: 7 days    Minutes of Exercise per Session: 20 min  Stress: No Stress Concern Present (07/02/2023)   [employer] of Occupational Health - Occupational Stress Questionnaire    Feeling of Stress : Not at all  Social Connections: Moderately Integrated  (07/02/2023)   Social Connection and Isolation Panel [NHANES]    Frequency of Communication with Friends and Family: More than three times a week    Frequency of Social Gatherings with Friends and Family: More than three times a week    Attends Religious Services: More than 4 times per year    Active Member of [employer] or Organizations: Yes    Attends Banker Meetings: More than 4 times per year    Marital Status: Widowed    Tobacco Counseling Counseling given: Yes Tobacco comments: Never smoked   Clinical Intake:  Pre-visit preparation completed: Yes  Pain : No/denies pain     BMI - recorded: 30.32 Nutritional Status: BMI > 30  Obese Nutritional Risks: None Diabetes: No  How often do you need to have someone help you when you read instructions, pamphlets, or other written materials from your doctor or pharmacy?: 1 - Never  Interpreter Needed?: No  Information entered by :: Abby , CMA   Activities of Daily Living    07/02/2023    2:18 PM  In your present state of health, do you have any difficulty performing the following activities:  Hearing? 0  Vision? 0  Difficulty concentrating or making decisions? 0  Walking or climbing stairs? 0  Dressing or bathing? 0  Doing errands, shopping? 0  Preparing Food and eating ? N  Using the Toilet? N  In the past six months, have you accidently leaked urine? N  Do you have problems with loss of bowel control? N  Managing your Medications? N  Managing your Finances? N  Housekeeping or managing your Housekeeping? N    Patient Care Team: Babs Sciara, MD as PCP - General (Family Medicine) [name], Rachelle Hora, MD as PCP - Cardiology (Cardiology) Jena Gauss Gerrit Friends, MD as Consulting Physician (Gastroenterology) Johney Maine, MD as Consulting Physician (Hematology)  Indicate any recent Medical Services you may have received from other than [facility] providers in the past year (date may be approximate).      Assessment:   This is a routine wellness  examination for Latarra.  Hearing/Vision screen Hearing Screening - Comments:: Complains of hearing difficulties. Declines a referral to an ENT for a hearing test.   Dietary issues and exercise activities discussed:     Goals Addressed             This Visit's Progress    Patient Stated       Patient states her goal is to feel better       Depression Screen    07/02/2023    2:09 PM 04/28/2023   10:42 AM 01/28/2023   10:40 AM 10/09/2022    4:17 PM 06/09/2022    1:32 PM 07/28/2021   10:18 AM 05/27/2021    1:14 PM  PHQ 2/9 Scores  PHQ - 2 Score 0 0 3 2 3  0 0  PHQ- 9 Score  2 3 7 5       Fall Risk    07/02/2023    2:17 PM 04/28/2023   10:42 AM 01/28/2023   10:40 AM 10/09/2022    4:17 PM 06/09/2022    1:41 PM  Fall Risk   Falls in the past year? 1 1 0 1 0  Number falls in past yr: 1 0 0 0 0  Injury with Fall? 1 1 0 1 0  Risk for fall due to : History of fall(s);Impaired balance/gait;Orthopedic patient   Impaired balance/gait;Mental status change No Fall Risks  Follow up Education provided;Falls prevention discussed;Falls evaluation completed   Falls evaluation completed;Education provided Falls prevention discussed    MEDICARE RISK AT HOME:  Medicare Risk at Home - 07/02/23 1413     Any stairs in or around the home? No    If so, are there any without handrails? No    Home free of loose throw rugs in walkways, pet beds, electrical cords, etc? Yes    Adequate lighting in your home to reduce risk of falls? No    Life alert? No    Use of a cane, walker or w/c? No    Grab bars in the bathroom? Yes    Shower chair or bench in shower? No    Elevated toilet seat or a handicapped toilet? No             TIMED UP AND GO:  Was the test performed?  No    Cognitive Function:        07/02/2023    2:09 PM 06/09/2022    1:48 PM  6CIT Screen  What Year? 0 points 0 points  What month? 0 points 0 points  What time? 0 points 0  points  Count back from 20 0 points 0 points  Months in reverse 0 points 0 points  Repeat phrase 0 points 0 points  Total Score 0 points 0 points    Immunizations Immunization History  Administered Date(s) Administered   Fluad Quad(high Dose 65+) 09/24/2020, 09/04/2022   Influenza, High Dose Seasonal PF 08/30/2021   Influenza,inj,Quad PF,6+ Mos 09/06/2018   Influenza-Unspecified 08/31/2015, 08/06/2016, 09/16/2017, 10/19/2019   PFIZER(Purple Top)SARS-COV-2 Vaccination 12/04/2019, 12/26/2019   Pneumococcal Conjugate-13 06/19/2014   Pneumococcal Polysaccharide-23 10/05/2017   Zoster Recombinant(Shingrix) 01/05/2023   Zoster, Live 10/05/2012    TDAP status: Due, Education has been provided regarding the importance of this vaccine. Advised may receive this vaccine at local pharmacy or Health Dept. Aware to provide a copy of the vaccination record if obtained from local pharmacy or Health Dept. Verbalized acceptance and understanding.  Flu Vaccine status: Due, Education has been  provided regarding the importance of this vaccine. Advised may receive this vaccine at local pharmacy or Health Dept. Aware to provide a copy of the vaccination record if obtained from local pharmacy or Health Dept. Verbalized acceptance and understanding.  Pneumococcal vaccine status: Up to date  Covid-19 vaccine status: Information provided on how to obtain vaccines.   Qualifies for Shingles Vaccine? Yes   Zostavax completed Yes   Shingrix Completed?: Yes  Screening Tests Health Maintenance  Topic Date Due   DTaP/Tdap/Td (1 - Tdap) Never done   COVID-19 Vaccine (3 - Pfizer risk series) 01/23/2020   Zoster Vaccines- Shingrix (2 of 2) 03/02/2023   Medicare Annual Wellness (AWV)  06/10/2023   INFLUENZA VACCINE  07/01/2023   Pneumonia Vaccine 65+ Years old  Completed   DEXA SCAN  Completed   HPV VACCINES  Aged Out    Health Maintenance  Health Maintenance Due  Topic Date Due   DTaP/Tdap/Td (1 - Tdap)  Never done   COVID-19 Vaccine (3 - Pfizer risk series) 01/23/2020   Zoster Vaccines- Shingrix (2 of 2) 03/02/2023   Medicare Annual Wellness (AWV)  06/10/2023   INFLUENZA VACCINE  07/01/2023    Colorectal cancer screening: No longer required.   Mammogram status: No longer required due to age.  Bone Density status: Completed 07/30/2022. Results reflect: Bone density results: OSTEOPENIA. Repeat every 2 years.  Lung Cancer Screening: (Low Dose CT Chest recommended if Age 80-80 years, 20 pack-year currently smoking OR have quit w/in 15years.) does not qualify.   Lung Cancer Screening Referral: n/a  Additional Screening:  Hepatitis C Screening: does not qualify;  Vision Screening: Recommended annual ophthalmology exams for early detection of glaucoma and other disorders of the eye. Is the patient up to date with their annual eye exam?  Yes  Who is the provider or what is the name of the office in which the patient attends annual eye exams? Dr. Nile Riggs. He retired and she was given a referral to establish with a new provider.  If pt is not established with a provider, would they like to be referred to a provider to establish care? No .   Dental Screening: Recommended annual dental exams for proper oral hygiene  Diabetic Foot Exam: n/a  Community Resource Referral / Chronic Care Management: CRR required this visit?  No   CCM required this visit?  No     Plan:     I have personally reviewed and noted the following in the patient's chart:   Medical and social history Use of alcohol, tobacco or illicit drugs  Current medications and supplements including opioid prescriptions. Patient is not currently taking opioid prescriptions. Functional ability and status Nutritional status Physical activity Advanced directives List of other physicians Hospitalizations, surgeries, and ER visits in previous 12 months Vitals Screenings to include cognitive, depression, and falls Referrals  and appointments  In addition, I have reviewed and discussed with patient certain preventive protocols, quality metrics, and best practice recommendations. A written personalized care plan for preventive services as well as general preventive health recommendations were provided to patient.     Jordan Hawks , CMA   07/02/2023   After Visit Summary: (Mail) Due to this being a telephonic visit, the after visit summary with patients personalized plan was offered to patient via mail   Nurse Notes: Patient is very pleasant and kind.

## 2023-07-07 ENCOUNTER — Telehealth: Payer: Self-pay

## 2023-07-07 MED ORDER — INDAPAMIDE 1.25 MG PO TABS: ORAL_TABLET | ORAL | 0 refills | Status: DC

## 2023-07-07 NOTE — Telephone Encounter (Signed)
Received via fax Rx request: Prescription sent electronically to pharmacy  

## 2023-07-07 NOTE — Telephone Encounter (Signed)
Prescription Request  07/07/2023  LOV: Visit date not found  What is the name of the medication or equipment? indapamide (LOZOL) 1.25 MG tablet   Have you contacted your pharmacy to request a refill? Yes   Which pharmacy would you like this sent to?   [facility]  [address]   Patient notified that their request is being sent to the clinical staff for review and that they should receive a response within 2 business days.   Please advise at Mobile [phone] (mobile)

## 2023-07-14 ENCOUNTER — Ambulatory Visit: Payer: PPO | Admitting: Cardiovascular Disease

## 2023-07-14 ENCOUNTER — Encounter: Payer: Self-pay | Admitting: Cardiovascular Disease

## 2023-07-14 VITALS — BP 122/87 | HR 66 | Ht 65.5 in | Wt 187.6 lb

## 2023-07-14 DIAGNOSIS — I4821 Permanent atrial fibrillation: Secondary | ICD-10-CM

## 2023-07-14 DIAGNOSIS — I5032 Chronic diastolic (congestive) heart failure: Secondary | ICD-10-CM

## 2023-07-14 DIAGNOSIS — D6869 Other thrombophilia: Secondary | ICD-10-CM | POA: Diagnosis not present

## 2023-07-14 DIAGNOSIS — E039 Hypothyroidism, unspecified: Secondary | ICD-10-CM

## 2023-07-14 DIAGNOSIS — I10 Essential (primary) hypertension: Secondary | ICD-10-CM | POA: Diagnosis not present

## 2023-07-14 DIAGNOSIS — C9110 Chronic lymphocytic leukemia of B-cell type not having achieved remission: Secondary | ICD-10-CM

## 2023-07-14 DIAGNOSIS — I4819 Other persistent atrial fibrillation: Secondary | ICD-10-CM

## 2023-07-14 NOTE — Patient Instructions (Signed)

## 2023-07-14 NOTE — Progress Notes (Unsigned)
Patient ID: [name], female   DOB: November 06, 1936, 87 y.o.   MRN: [id number]      Cardiology Office Note   Date:  07/15/2023   ID:  CHARNELE BEATRICE, DOB [birth year], MRN [id number]  PCP:  Babs Sciara, MD  Cardiologist:   Thurmon Fair, MD   Chief Complaint  Patient presents with   Atrial Fibrillation      History of Present Illness: [name] is a 87 y.o. female who presents for follow-up of atrial fibrillation.  The arrhythmia is asymptomatic and she has severe biatrial dilation and she had early recurrence following cardioversion so we are managing it as permanent atrial fibrillation.  Additional medical problems include pulmonary fibrosis, lymphoproliferative disorder, systemic hypertension, treated hypothyroidism, hiatal hernia and osteopenia.  I took care of her husband, WF, for many years.  He passed away last 07/21/21.  They were married for 67 years.    Remains largely unaware of the arrhythmia, occasionally has palpitations if she is emotional..  Has not had any falls or bleeding on Eliquis.  Rate control has been good.  She has not had dizziness or syncope.  Has NYHA functional class II exertional dyspnea, without orthopnea, PND, lower extremity edema or chest pain.  She is on treatment with Ofev for pulmonary fibrosis. She is seeing Dr. Celine Mans in the pulmonary clinic.  She had an episode of pneumonia in January 23 (fever, persistent cough, fatigue) and was treated with doxycycline.  She was in normal sinus rhythm when she was seen in the emergency room on January 15.  She developed atrial fibrillation, detected at her follow-up visit with Dr. Gerda Diss on January 07, 2022.  He started her on anticoagulation with Eliquis, which she has been taking since then.  She was spontaneously rate controlled (on metoprolol succinate 25 mg once daily).  She was unaware of the palpitations.  Recently she has also been diagnosed with lymphoproliferative disorder, possible CLL and  has a follow-up visit with Dr. Candise Che on June 26.  She has a long-standing history of systemic hypertension, although she only started taking antihypertensives medications at the age of 46. She has well treated hypothyroidism history of hiatal hernia and osteopenia but has otherwise been remarkably healthy.  She is borderline obese with a BMI just over 30.  About 20 years ago she had a chest x-ray that described cardiomegaly but this was not investigated until 22-Jul-2015 when she had an echocardiogram.  This demonstrated that her cardiomegaly was due to mild biatrial dilation.  Her PA pressure was mildly elevated at 34 mmHg, left ventricular systolic function was normal and there were only very subtle signs of diastolic dysfunction.  She was asymptomatic at the time.  She had another echocardiogram in June 2022.  PA pressure was described as normal and the left atrium was described as moderately dilated.  LV EF remains normal at 60 to 65%.  Finally she had an echocardiogram performed 02/10/2022.  Once again she has normal left ventricular systolic function with a EF of 55 to 60% and there is normal regional wall motion.  There is mild LVH.  Diastolic function could not be assessed due to atrial fibrillation.  Pulmonary pressure was estimated substantially higher at 46.7 mmHg.  Both atria were severely dilated.  Again, there were no significant valvular abnormalities, although now there is moderate TR.   Past Medical History:  Diagnosis Date   Chronic Leukemia    Depression    H/O hiatal hernia  Hernia    Hernia    right inguinal hernia   Hypertension    Hypothyroidism    Leg swelling    Osteitis pubis (HCC) 05/30/2001   Osteopenia 03/30/2009   Osteopenia 07/30/2022   Bone density 2021 showed elevated FRAX patient was started on Fosamax   Thyroid disease     Past Surgical History:  Procedure Laterality Date   ABDOMINAL HYSTERECTOMY  03/1970   APPENDECTOMY  03/1966   BILATERAL SALPINGOOPHORECTOMY      CARDIOVERSION N/A 03/10/2022   Procedure: CARDIOVERSION;  Surgeon: Thurmon Fair, MD;  Location: [facility];  Service: Cardiovascular;  Laterality: N/A;   CATARACT EXTRACTION W/PHACO Right 09/25/2014   Procedure: CATARACT EXTRACTION PHACO AND INTRAOCULAR LENS PLACEMENT (IOC);  Surgeon: Loraine Leriche T. Nile Riggs, MD;  Location: [facility];  Service: Ophthalmology;  Laterality: Right;  CDE:7.56   CATARACT EXTRACTION W/PHACO Left 10/09/2014   Procedure: CATARACT EXTRACTION PHACO AND INTRAOCULAR LENS PLACEMENT LEFT EYE CDE=6.51;  Surgeon: Loraine Leriche T. Nile Riggs, MD;  Location: [facility];  Service: Ophthalmology;  Laterality: Left;   CHOLECYSTECTOMY  1980   COLONOSCOPY  1999   COLONOSCOPY  March 2007   Repeat 10 years   COLONOSCOPY  11/06/2003    Internal and external hemorrhoids/Two tiny rectal distal sigmoid polyps cold biopsied/Otherwise within normal limits to the cecum   COLONOSCOPY N/A 08/29/2014   Dr. Jena Gauss: Sessile polyp was found at the cecum status post cold snare polypectomy, tubular adenoma. No further surveillance unless clinical status changes   ESOPHAGOGASTRODUODENOSCOPY  2007   Dr. Jena Gauss: normal esophagus, small tom oderate sized hiatal hernia, Cameron lesions   SKIN CANCER EXCISION Left 11 1 2016   left arm squamous cancer   TUMOR REMOVAL  03/1968   ovarian     Current Outpatient Medications  Medication Sig Dispense Refill   acetaminophen (TYLENOL) 650 MG CR tablet Take 650 mg by mouth every 8 (eight) hours as needed for pain.     albuterol (VENTOLIN HFA) 108 (90 Base) MCG/ACT inhaler INHALE 2 PUFFS INTO THE LUNGS EVERY 6 HOURS AS NEEDED FOR WHEEZING OR SHORTNESS OF BREATH 18 g 1   alendronate (FOSAMAX) 70 MG tablet TAKE 1 TABLET(70 MG) BY MOUTH EVERY 7 DAYS WITH A FULL GLASS OF WATER AND ON AN EMPTY STOMACH 4 tablet 3   ALPRAZolam (XANAX) 0.5 MG tablet TAKE 1 TABLET BY MOUTH EVERY NIGHT AT BEDTIME. MAY TAKE 1/2 TABLET DURING THE DAY AS NEEDED 45 tablet 3   apixaban (ELIQUIS) 5 MG TABS tablet  Take 1 tablet (5 mg total) by mouth 2 (two) times daily. Pt DUE for FOLLOW-UP, MUST see PROVIDER for FUTURE refills. 180 tablet 0   Cholecalciferol (VITAMIN D3) 2000 UNITS TABS Take 4,000 Units by mouth daily.     diclofenac Sodium (VOLTAREN) 1 % GEL Apply 2 g topically 4 (four) times daily. 50 g 0   indapamide (LOZOL) 1.25 MG tablet TAKE 1 TABLET BY MOUTH EVERY MORNING 90 tablet 0   levothyroxine (SYNTHROID) 125 MCG tablet TAKE 1 TABLET BY MOUTH DAILY 90 tablet 1   metoprolol succinate (TOPROL-XL) 25 MG 24 hr tablet TAKE 1 TABLET BY MOUTH DAILY 90 tablet 1   Multiple Vitamin (MULTI-VITAMIN DAILY PO) Take 1 tablet by mouth daily.     Nintedanib (OFEV) 150 MG CAPS Take 1 capsule (150 mg total) by mouth 2 (two) times daily.     pantoprazole (PROTONIX) 40 MG tablet TAKE 1 TABLET(40 MG) BY MOUTH DAILY 90 tablet 1   potassium chloride (  KLOR-CON M) 10 MEQ tablet TAKE 1 TABLET(10 MEQ) BY MOUTH DAILY 90 tablet 1   sertraline (ZOLOFT) 100 MG tablet 1 qd 90 tablet 1   No current facility-administered medications for this visit.    Allergies:   Erythromycin, Aspirin, and Ibuprofen    Social History:  The patient  reports that she has never smoked. She has never used smokeless tobacco. She reports current alcohol use. She reports that she does not use drugs.   Family History:  The patient's family history includes Cancer in her father and sister; Dementia in her mother; Heart disease in her father.    ROS:  Please see the history of present illness.    Otherwise, review of systems positive for none.   All other systems are reviewed and negative.    PHYSICAL EXAM: VS:  BP 122/87   Pulse 66   Ht 5' 5.5" (1.664 m)   Wt 187 lb 9.6 oz (85.1 kg)   SpO2 94%   BMI 30.74 kg/m  , BMI Body mass index is 30.74 kg/m.   General: Alert, oriented x3, no distress, looks very comfortable.  Borderline obese. Head: no evidence of trauma, PERRL, EOMI, no exophtalmos or lid lag, no myxedema, no xanthelasma;  normal ears, nose and oropharynx Neck: normal jugular venous pulsations and no hepatojugular reflux; brisk carotid pulses without delay and no carotid bruits Chest: clear to auscultation, no signs of consolidation by percussion or palpation, normal fremitus, symmetrical and full respiratory excursions Cardiovascular: normal position and quality of the apical impulse, irregular rhythm, normal first and second heart sounds, 1-2/6 holosystolic murmur at the left lower sternal border, no diastolic murmurs, rubs or gallops Abdomen: no tenderness or distention, no masses by palpation, no abnormal pulsatility or arterial bruits, normal bowel sounds, no hepatosplenomegaly Extremities: no clubbing, cyanosis or edema; 2+ radial, ulnar and brachial pulses bilaterally; 2+ right femoral, posterior tibial and dorsalis pedis pulses; 2+ left femoral, posterior tibial and dorsalis pedis pulses; no subclavian or femoral bruits Neurological: grossly nonfocal Psych: Normal mood and affect    EKG:   Ordered today shows coarse atrial fibrillation with ventricular rate of 66 bpm, incomplete right bundle branch block. Recent Labs: 10/09/2022: BNP 262.3 12/01/2022: Hemoglobin 12.4; Platelet Count 352 04/08/2023: ALT 11 04/14/2023: BUN 15; Creatinine, Ser 0.98; Magnesium 1.7; Potassium 4.1; Sodium 138; TSH 1.860    Lipid Panel    Component Value Date/Time   CHOL 166 04/14/2023 1112   TRIG 82 04/14/2023 1112   HDL 65 04/14/2023 1112   CHOLHDL 2.6 04/14/2023 1112   CHOLHDL 2.4 06/12/2014 0950   VLDL 21 06/12/2014 0950   LDLCALC 86 04/14/2023 1112      Wt Readings from Last 3 Encounters:  07/14/23 187 lb 9.6 oz (85.1 kg)  07/02/23 185 lb (83.9 kg)  04/28/23 185 lb 3.2 oz (84 kg)      Other studies Reviewed: Additional studies/ records that were reviewed today include:   Reviewed multiple echocardiograms from 2016, 2022 and most recently from 02/10/2022:  Reviewing the study side-by-side, the most striking  finding is how rapidly the atria have enlarged   1. Left ventricular ejection fraction, by estimation, is 55 to 60%. The left ventricle has normal function. The left ventricle has no regional wall motion abnormalities. There is mild concentric left ventricular hypertrophy. Diastolic function  indeterminant due to Afib.   2. Right ventricular systolic function is normal. The right ventricular size is normal. There is moderately elevated pulmonary artery systolic pressure.  The estimated right ventricular systolic pressure is 46.7 mmHg.   3. Left atrial size was moderately-to-severely dilated.   4. Right atrial size was severely dilated.   5. The mitral valve is abnormal. Mild mitral valve regurgitation.  Moderate mitral annular calcification.   6. Tricuspid valve regurgitation is moderate.   7. The aortic valve is tricuspid. There is mild calcification of the aortic valve. There is mild thickening of the aortic valve. Aortic valve regurgitation is trivial.   8. The inferior vena cava is dilated in size with >50% respiratory variability, suggesting right atrial pressure of 8 mmHg.   Comparison(s): Compared to prior TTE in 04/2021, the TR is now moderate (previously trivial) and there is moderate pulmonary HTN (previously normal).   02/25/2022   The study is normal. The study is low risk.   No ST deviation was noted.   LV perfusion is normal.   Prior study not available for comparison.   Normal perfusion. Study was not gated due to atrial fibrillation. This is a low risk study. No prior for comparison.     ASSESSMENT AND PLAN:  1. Permanent atrial fibrillation (HCC)   2. Acquired thrombophilia (HCC)   3. Chronic diastolic (congestive) heart failure (HCC)   4. Essential hypertension, benign   5. Acquired hypothyroidism   6. Chronic lymphocytic leukemia (HCC)      AFib: Asymptomatic, significant biatrial dilation, early recurrence after cardioversion, will manage as permanent atrial  fibrillation.  Her shortness of breath is due to her lung problems.   She is well rate controlled on a low-dose of beta-blocker.  CHA2DS2-VASc 5 (age 53, gender, HTN, heart failure).   Anticoagulation: No bleeding complications. CHF: May be a minor contributor to her exertional dyspnea.  Hard to evaluate diastolic function due to the atrial fibrillation.  Consider amyloidosis, but on further review of her echocardiogram, the mitral annulus diastolic velocities are really not that low.  Suspect that her lung problems are primarily responsible for her shortness of breath.  We will hold off a PYP scan.  Note normal myeloma panel in May 2023 HTN: Well-controlled Hypothyroidism: Clinically euthyroid, normal TSH recently. Lymphoproliferative disorder: Not requiring active therapy.  Has follow-up in January with Dr. Candise Che.  Currently not receiving active therapy.  Current medicines are reviewed at length with the patient today.  The patient does not have concerns regarding medicines.  The following changes have been made:  no change  Labs/ tests ordered today include:   Orders Placed This Encounter  Procedures   EKG 12-Lead   Patient Instructions  Medication Instructions:  No changes *If you need a refill on your cardiac medications before your next appointment, please call your pharmacy*  Follow-Up: At [facility], you and your health needs are our priority.  As part of our continuing mission to provide you with exceptional heart care, we have created designated Provider Care Teams.  These Care Teams include your primary Cardiologist (physician) and Advanced Practice Providers (APPs -  Physician Assistants and Nurse Practitioners) who all work together to provide you with the care you need, when you need it.  We recommend signing up for the patient portal called "MyChart".  Sign up information is provided on this After Visit Summary.  MyChart is used to connect with patients for Virtual  Visits (Telemedicine).  Patients are able to view lab/test results, encounter notes, upcoming appointments, etc.  Non-urgent messages can be sent to your provider as well.   To learn more about  what you can do with MyChart, go to [email or web address].    Your next appointment:   1 year(s)  Provider:   Thurmon Fair, MD      Signed, Thurmon Fair, MD  07/15/2023 5:01 PM    Thurmon Fair, MD, [facility] [phone] office [phone] pager

## 2023-07-27 ENCOUNTER — Encounter: Payer: Self-pay | Admitting: Family Medicine

## 2023-07-27 ENCOUNTER — Ambulatory Visit (INDEPENDENT_AMBULATORY_CARE_PROVIDER_SITE_OTHER): Payer: PPO | Admitting: Family Medicine

## 2023-07-27 VITALS — BP 146/76 | HR 70 | Temp 97.5°F | Ht 65.5 in | Wt 190.0 lb

## 2023-07-27 DIAGNOSIS — J84112 Idiopathic pulmonary fibrosis: Secondary | ICD-10-CM

## 2023-07-27 DIAGNOSIS — E038 Other specified hypothyroidism: Secondary | ICD-10-CM

## 2023-07-27 DIAGNOSIS — I4819 Other persistent atrial fibrillation: Secondary | ICD-10-CM | POA: Diagnosis not present

## 2023-07-27 DIAGNOSIS — I10 Essential (primary) hypertension: Secondary | ICD-10-CM | POA: Diagnosis not present

## 2023-07-27 NOTE — Progress Notes (Signed)
   Subjective:    Patient ID: [name], female    DOB: [birth year], 87 y.o.   MRN: [id number]  HPI 3 month follow up has had some high BP readings and headaache this past Sunday 157/88, 166/117,140/103   Blood pressure mildly elevated on several occasions Not having headache currently Trying to eat healthy Taking all of her medicines Is on a new medication for her pulmonary issue A small percentage of individuals can have elevated blood pressure while on that medicine Her other medications were reviewed Lab work from May was reviewed  Review of Systems     Objective:   Physical Exam  General-in no acute distress Eyes-no discharge Lungs-respiratory rate normal, CTA CV-no murmurs, atrial fibrillation rate controlled Extremities skin warm dry no edema Neuro grossly normal Behavior normal, alert  She relates her moods are doing good     Assessment & Plan:  Patient was concerned that her cardiologist utilize diagnosis of CHF and most recent visit I discussed with her the findings of diastolic dysfunction and the how her overall systolic function is good  Her blood pressure is mildly elevated we will recheck this again in several weeks if it is still elevated then the next step would be adding medication more than likely this is related to her new pulmonary medicine no additional medicines needed today   A-fib stable on medications  Hypothyroidism doing well with medicine  Lab work later this year

## 2023-07-29 ENCOUNTER — Ambulatory Visit: Payer: PPO | Admitting: Family Medicine

## 2023-08-06 ENCOUNTER — Other Ambulatory Visit: Payer: Self-pay | Admitting: Family Medicine

## 2023-08-12 DIAGNOSIS — X32XXXD Exposure to sunlight, subsequent encounter: Secondary | ICD-10-CM | POA: Diagnosis not present

## 2023-08-12 DIAGNOSIS — Z08 Encounter for follow-up examination after completed treatment for malignant neoplasm: Secondary | ICD-10-CM | POA: Diagnosis not present

## 2023-08-12 DIAGNOSIS — Z85828 Personal history of other malignant neoplasm of skin: Secondary | ICD-10-CM | POA: Diagnosis not present

## 2023-08-12 DIAGNOSIS — L570 Actinic keratosis: Secondary | ICD-10-CM | POA: Diagnosis not present

## 2023-08-14 ENCOUNTER — Other Ambulatory Visit: Payer: Self-pay | Admitting: Family Medicine

## 2023-08-17 ENCOUNTER — Encounter: Payer: Self-pay | Admitting: Family Medicine

## 2023-08-17 ENCOUNTER — Ambulatory Visit (INDEPENDENT_AMBULATORY_CARE_PROVIDER_SITE_OTHER): Payer: PPO | Admitting: Family Medicine

## 2023-08-17 ENCOUNTER — Ambulatory Visit: Payer: PPO | Admitting: Family Medicine

## 2023-08-17 ENCOUNTER — Other Ambulatory Visit: Payer: Self-pay | Admitting: Family Medicine

## 2023-08-17 VITALS — BP 141/81 | HR 72 | Temp 97.7°F | Wt 191.6 lb

## 2023-08-17 DIAGNOSIS — J84112 Idiopathic pulmonary fibrosis: Secondary | ICD-10-CM | POA: Diagnosis not present

## 2023-08-17 DIAGNOSIS — Z23 Encounter for immunization: Secondary | ICD-10-CM | POA: Diagnosis not present

## 2023-08-17 DIAGNOSIS — Z79899 Other long term (current) drug therapy: Secondary | ICD-10-CM | POA: Diagnosis not present

## 2023-08-17 DIAGNOSIS — B359 Dermatophytosis, unspecified: Secondary | ICD-10-CM | POA: Diagnosis not present

## 2023-08-17 DIAGNOSIS — D72829 Elevated white blood cell count, unspecified: Secondary | ICD-10-CM

## 2023-08-17 MED ORDER — KETOCONAZOLE 2 % EX CREA: TOPICAL_CREAM | CUTANEOUS | 4 refills | Status: DC

## 2023-08-17 MED ORDER — FLUCONAZOLE 150 MG PO TABS: ORAL_TABLET | ORAL | 0 refills | Status: DC

## 2023-08-17 NOTE — Progress Notes (Signed)
Subjective:    Patient ID: [name], female    DOB: 1936/11/12, 87 y.o.   MRN: [id number]  HPI Patient coming in for a 3 week blood pressure check. Patient has a rash under both breast. Patient is very concern and believes is due to her new medication she is on Ofev 150mg . No further concerns.  Patient having intermittent dyspnea issues with exertion related to her lung issues Erythematous rash under the breast that seems to be worse recently Patient been doing blood pressure readings at home did not bring her cuff in with her readings have been elevated   Review of Systems     Objective:   Physical Exam General-in no acute distress Eyes-no discharge Lungs-respiratory rate normal, CTA CV-no murmurs,RRR Extremities skin warm dry no edema Neuro grossly normal Behavior normal, alert Nurse present-rash under the breast consistent with tinea       Assessment & Plan:   1. Needs flu shot Today - Flu Vaccine Trivalent High Dose (Fluad)  2. Idiopathic pulmonary fibrosis (HCC) Recommend continuing the medication She will bring her blood pressure cuff with her on her next visit Follow-up again within 3 months She was asking questions about what to expect down the road when her disease progresses we did discuss some issues regarding that as well as end-of-life issues but at the same time she relates that she has a living will and does not want to be on a ventilator does not want CPR but does want medical care if it can make a difference  3. Tinea Diflucan as well as home approaches to lessen moisture underneath the breast plus also ketoconazole twice daily  4. High risk medication use Her lung medicine can cause elevated liver enzymes we will check this - Hepatic Function Panel  5. Leukocytosis, unspecified type She has a history of CLL we will check the white blood count - CBC with Differential Follow-up 3 months

## 2023-08-18 ENCOUNTER — Telehealth: Payer: Self-pay

## 2023-08-18 ENCOUNTER — Other Ambulatory Visit: Payer: Self-pay | Admitting: Nurse Practitioner

## 2023-08-18 NOTE — Telephone Encounter (Signed)
Prescription Request  08/18/2023  LOV: Visit date not found  What is the name of the medication or equipment? levothyroxine (SYNTHROID) 125 MCG tablet   Have you contacted your pharmacy to request a refill? Yes   Which pharmacy would you like this sent to? [facility]  [address]    Patient notified that their request is being sent to the clinical staff for review and that they should receive a response within 2 business days.   Please advise at Mobile [phone] (mobile)

## 2023-08-19 ENCOUNTER — Other Ambulatory Visit: Payer: Self-pay | Admitting: Nurse Practitioner

## 2023-08-19 MED ORDER — LEVOTHYROXINE SODIUM 125 MCG PO TABS: ORAL_TABLET | ORAL | 1 refills | Status: DC

## 2023-08-23 DIAGNOSIS — J8410 Pulmonary fibrosis, unspecified: Secondary | ICD-10-CM | POA: Diagnosis not present

## 2023-08-23 DIAGNOSIS — Z6831 Body mass index (BMI) 31.0-31.9, adult: Secondary | ICD-10-CM | POA: Diagnosis not present

## 2023-08-23 DIAGNOSIS — Z7901 Long term (current) use of anticoagulants: Secondary | ICD-10-CM | POA: Diagnosis not present

## 2023-08-23 DIAGNOSIS — I4891 Unspecified atrial fibrillation: Secondary | ICD-10-CM | POA: Diagnosis not present

## 2023-08-23 DIAGNOSIS — C9190 Lymphoid leukemia, unspecified not having achieved remission: Secondary | ICD-10-CM | POA: Diagnosis not present

## 2023-08-23 DIAGNOSIS — F329 Major depressive disorder, single episode, unspecified: Secondary | ICD-10-CM | POA: Diagnosis not present

## 2023-08-23 DIAGNOSIS — M810 Age-related osteoporosis without current pathological fracture: Secondary | ICD-10-CM | POA: Diagnosis not present

## 2023-08-24 ENCOUNTER — Other Ambulatory Visit: Payer: Self-pay | Admitting: Cardiovascular Disease

## 2023-08-24 NOTE — Telephone Encounter (Signed)
Prescription refill request for Eliquis received. Indication: AF Last office visit: 07/14/23  Judie Petit [name] MD Scr: 0.98 on 04/14/23 Age: 87 Weight: 85.1kg   Based on above findings Eliquis 5mg  twice daily is the appropriate dose.  Refill approved.

## 2023-08-27 ENCOUNTER — Other Ambulatory Visit: Payer: Self-pay | Admitting: Family Medicine

## 2023-09-20 ENCOUNTER — Other Ambulatory Visit: Payer: Self-pay | Admitting: Family Medicine

## 2023-10-05 ENCOUNTER — Other Ambulatory Visit: Payer: Self-pay | Admitting: Family Medicine

## 2023-10-07 ENCOUNTER — Telehealth: Payer: Self-pay | Admitting: Internal Medicine

## 2023-10-07 NOTE — Telephone Encounter (Signed)
Patient needs refill of [name] - [facility]  Pharmacy [facility] in [place]

## 2023-10-11 NOTE — Telephone Encounter (Signed)
Returning missed call

## 2023-10-11 NOTE — Telephone Encounter (Signed)
ATC x1.  LVM to return call.  Needs OV when she returns call as well.

## 2023-10-18 ENCOUNTER — Ambulatory Visit (INDEPENDENT_AMBULATORY_CARE_PROVIDER_SITE_OTHER): Payer: PPO | Admitting: Family Medicine

## 2023-10-18 VITALS — BP 126/78 | HR 66 | Temp 97.4°F | Ht 65.5 in | Wt 187.8 lb

## 2023-10-18 DIAGNOSIS — I10 Essential (primary) hypertension: Secondary | ICD-10-CM

## 2023-10-18 DIAGNOSIS — Z79899 Other long term (current) drug therapy: Secondary | ICD-10-CM

## 2023-10-18 DIAGNOSIS — J84112 Idiopathic pulmonary fibrosis: Secondary | ICD-10-CM

## 2023-10-18 DIAGNOSIS — E038 Other specified hypothyroidism: Secondary | ICD-10-CM | POA: Diagnosis not present

## 2023-10-18 NOTE — Progress Notes (Signed)
Subjective:    Patient ID: [name], female    DOB: [birth year], 87 y.o.   MRN: [id number]  Discussed the use of AI scribe software for clinical note transcription with the patient, who gave verbal consent to proceed.  History of Present Illness   The patient, with a history of hypertension, thyroid disease, and on blood thinners, reports running out of her Ofev medication. She has been taking it for the past three months and has been in contact with her pharmacy and Dr. Humphrey Rolls office to refill the prescription, but has yet to receive it. She plans to contact the pharmacy again to inform them of the situation.  The patient reports no issues with her breathing as long as she paces herself. Her appetite is good and her mood is stable. She has been taking her thyroid, blood pressure, and blood thinner medications without any issues. She had one episode of nausea and vomiting, but the vomit was white and bitter, not yellow. She has not had any falls since January and her driving is going well.  The patient also reports frequent urination, having to go to the bathroom every ten minutes and then again ten to fifteen minutes later. She also mentions that her blood pressure has been fluctuating, but it has been lower recently. She takes alprazolam, primarily at night and occasionally during the day. She has not had any bleeding issues.  The patient lives with her dog, who she talks to and who alerts her to anyone approaching her home. She has a long driveway and the dog is free to roam around the house with a collar that allows her to go nine feet around the house.         Review of Systems     Objective:    Physical Exam   VITALS: BP- 112/70 seated, 126/78 standing. CHEST: Lungs clear to auscultation. CARDIOVASCULAR: Heart sounds normal. EXTREMITIES: No significant edema.           Assessment & Plan:  Assessment and Plan    Medication Refill Patient has run out of Ofev  (nintedanib), a medication prescribed by Dr. Celine Mans. There seems to be a communication issue between the patient, the pharmacy, and Dr. Humphrey Rolls office. -Message Dr. Humphrey Rolls office to request a refill of Ofev. -Advise patient to contact the pharmacy in [place] to confirm receipt of the prescription.  Chronic Conditions Patient reports doing well with her current regimen of medications for thyroid, blood pressure, and blood thinning. No adverse effects reported. -Continue current medications as prescribed.  General Health Maintenance Patient reports occasional nausea and vomiting of white, bitter substance. No difficulty swallowing or other gastrointestinal symptoms reported. No recent falls or driving issues. Blood pressure is well-controlled. -Plan to conduct blood work in the near future to check kidney function and thyroid levels. -Collect urine specimen during next visit to check for protein. -Continue monitoring blood pressure and adjust medication as needed.      1. Essential hypertension, benign Blood pressure under good control continue current measures - Basic Metabolic Panel - Microalbumin/Creatinine Ratio, Urine  2. Other specified hypothyroidism Continue current measures check thyroid function - TSH + free T4  3. High risk medication use Continue current measures  4. Idiopathic pulmonary fibrosis (HCC) She states she is holding her own Medication is helping She is ran out of refills We will have pulmonary connect with her specialty pharmacy to get this  She uses Xanax sparingly during the day for anxiousness she  takes it in the evening time to help her rest She does take her blood thinner on a regular basis states her heart symptoms are doing well She is taking her levothyroxine consistently Also taking her Zoloft on a regular basis moods are doing good Taking pantoprazole daily GERD under good control  Patient to follow-up again within 4 months

## 2023-10-19 ENCOUNTER — Telehealth: Payer: Self-pay | Admitting: Family Medicine

## 2023-10-19 DIAGNOSIS — J84112 Idiopathic pulmonary fibrosis: Secondary | ICD-10-CM

## 2023-10-19 NOTE — Telephone Encounter (Signed)
Mutual patient between [place] and pulmonary Patient states that she needs refill of her medication and is requesting a phone call to her home If your clinic would reach back out to her that would be appreciated thank you-[facility]

## 2023-10-21 ENCOUNTER — Other Ambulatory Visit: Payer: Self-pay | Admitting: Family Medicine

## 2023-10-21 MED ORDER — OFEV 150 MG PO CAPS: 150.0000 mg | ORAL_CAPSULE | Freq: Two times a day (BID) | ORAL | Status: DC

## 2023-10-21 NOTE — Telephone Encounter (Signed)
Spoke with patient regarding  prior message. Patient does have a office visit with Dr.Desai and sent in 1 month supply of ofev.  Patient's voice was understanding.Nothing else further needed

## 2023-10-25 NOTE — Telephone Encounter (Signed)
Rx sent to pharmacy. OV scheduled for 11/02/23. Nothing further needed at this time.

## 2023-11-02 ENCOUNTER — Encounter: Payer: Self-pay | Admitting: Internal Medicine

## 2023-11-02 ENCOUNTER — Ambulatory Visit: Payer: PPO | Admitting: Internal Medicine

## 2023-11-02 VITALS — BP 140/86 | HR 77 | Ht 65.5 in | Wt 193.0 lb

## 2023-11-02 DIAGNOSIS — J84112 Idiopathic pulmonary fibrosis: Secondary | ICD-10-CM | POA: Diagnosis not present

## 2023-11-02 DIAGNOSIS — Z5181 Encounter for therapeutic drug level monitoring: Secondary | ICD-10-CM | POA: Diagnosis not present

## 2023-11-02 MED ORDER — OFEV 150 MG PO CAPS: 150.0000 mg | ORAL_CAPSULE | Freq: Two times a day (BID) | ORAL | 3 refills | Status: DC

## 2023-11-02 MED ORDER — OFEV 150 MG PO CAPS: 150.0000 mg | ORAL_CAPSULE | Freq: Two times a day (BID) | ORAL | 1 refills | Status: DC

## 2023-11-02 NOTE — Addendum Note (Signed)
Addended by: Murrell Redden on: 11/02/2023 03:55 PM   Modules accepted: Orders

## 2023-11-02 NOTE — Progress Notes (Signed)
[name]    [id number]    03/01/1936  Primary Care Physician:Luking, Jonna Coup, MD Date of Appointment: 11/02/2023 Established Patient Visit  Chief complaint:   Chief Complaint  Patient presents with   Follow-up    IPF/ILD F/U visit     HPI: [name] is a 87 y.o. with ILD, likely IPF who started [facility] July 2024  Interval Updates: Here for follow up. Having symptoms of diarrhea since starting ofev. She was on it for three months and has been off since about a month due to not receiving refills from our office. She says [facility] wasn't able to fill [facility] prescription and it should have been coming from [facility] in [place].    I have reviewed the patient's family social and past medical history and updated as appropriate.   Past Medical History:  Diagnosis Date   Chronic Leukemia    Depression    H/O hiatal hernia    Hernia    Hernia    right inguinal hernia   Hypertension    Hypothyroidism    Leg swelling    Osteitis pubis (HCC) 05/30/2001   Osteopenia 03/30/2009   Osteopenia 07/30/2022   Bone density 2021 showed elevated FRAX patient was started on Fosamax   Thyroid disease     Past Surgical History:  Procedure Laterality Date   ABDOMINAL HYSTERECTOMY  03/1970   APPENDECTOMY  03/1966   BILATERAL SALPINGOOPHORECTOMY     CARDIOVERSION N/A 03/10/2022   Procedure: CARDIOVERSION;  Surgeon: Thurmon Fair, MD;  Location: [facility];  Service: Cardiovascular;  Laterality: N/A;   CATARACT EXTRACTION W/PHACO Right 09/25/2014   Procedure: CATARACT EXTRACTION PHACO AND INTRAOCULAR LENS PLACEMENT (IOC);  Surgeon: Loraine Leriche T. Nile Riggs, MD;  Location: [facility];  Service: Ophthalmology;  Laterality: Right;  CDE:7.56   CATARACT EXTRACTION W/PHACO Left 10/09/2014   Procedure: CATARACT EXTRACTION PHACO AND INTRAOCULAR LENS PLACEMENT LEFT EYE CDE=6.51;  Surgeon: Loraine Leriche T. Nile Riggs, MD;  Location: [facility];  Service: Ophthalmology;  Laterality: Left;   CHOLECYSTECTOMY   1980   COLONOSCOPY  1999   COLONOSCOPY  March 2007   Repeat 10 years   COLONOSCOPY  11/06/2003    Internal and external hemorrhoids/Two tiny rectal distal sigmoid polyps cold biopsied/Otherwise within normal limits to the cecum   COLONOSCOPY N/A 08/29/2014   Dr. Jena Gauss: Sessile polyp was found at the cecum status post cold snare polypectomy, tubular adenoma. No further surveillance unless clinical status changes   ESOPHAGOGASTRODUODENOSCOPY  2007   Dr. 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  Gen:      No distress, frequent throat clearing Lungs:   faint bibasilar CV:       RRR Ext:  no edema  Data Reviewed: Imaging: I have personally reviewed the CT scan from April 2024 mild subpleural reticulation without honey combing or significant ground glass. Unable to assess for air trapping on expiratory cuts. No substantial progression from 2023.   PFTs:     Latest Ref Rng & Units 04/08/2023    2:09 PM 09/04/2022    9:03 AM 02/26/2022   12:39 PM  PFT Results   FVC-Pre L 1.61  1.55  1.69   FVC-Predicted Pre % 64  60  64   FVC-Post L   1.60   FVC-Predicted Post %   60   Pre FEV1/FVC % % 90  87  85   Post FEV1/FCV % %   90   FEV1-Pre L 1.45  1.35  1.44   FEV1-Predicted Pre % 78  71  74   FEV1-Post L   1.44   DLCO uncorrected ml/min/mmHg 13.09  10.50  11.18   DLCO UNC% % 68  54  57   DLCO corrected ml/min/mmHg 13.09  10.50    DLCO COR %Predicted % 68  54    DLVA Predicted % 130  101  102   TLC L   2.89   TLC % Predicted %   54   RV % Predicted %   50    I have personally reviewed the patient's PFTs and show moderate restriction with reduced DLCO consistent with ILD. There is no significant change when compared to 2023.   Labs: Lab Results  Component Value Date   NA 138 04/14/2023   K 4.1 04/14/2023   CO2 26 04/14/2023   GLUCOSE 98 04/14/2023   BUN 15 04/14/2023   CREATININE 0.98 04/14/2023   CALCIUM 9.3 04/14/2023   EGFR 57 (L) 04/14/2023   GFRNONAA >60 12/01/2022   Lab Results  Component Value Date   WBC 11.7 (H) 08/17/2023   HGB 13.1 08/17/2023   HCT 40.6 08/17/2023   MCV 93 08/17/2023   PLT 388 08/17/2023    Immunization status: Immunization History  Administered Date(s) Administered   Fluad Quad(high Dose 65+) 09/24/2020, 09/04/2022   Fluad Trivalent(High Dose 65+) 08/17/2023   Influenza, High Dose Seasonal PF 08/30/2021   Influenza,inj,Quad PF,6+ Mos 09/06/2018   Influenza-Unspecified 08/31/2015, 08/06/2016, 09/16/2017, 10/19/2019   PFIZER(Purple Top)SARS-COV-2 Vaccination 12/04/2019, 12/26/2019   Pneumococcal Conjugate-13 06/19/2014   Pneumococcal Polysaccharide-23 10/05/2017   Zoster Recombinant(Shingrix) 01/05/2023   Zoster, Live 10/05/2012    External Records Personally Reviewed: family medicine  Assessment:  IPF on ofev Encounter for high risk drug monitoring.  Plan/Recommendations: We have performed repeat CT scan and PFTs, neither of which show demonstrable progression of disease. However she does  have restriction to ventilation and is symptomatic.   Continue Ofev. Ok to resume dose.   LFTs reviewed from sept 2024 - within normal limits while on ofev.   Spirometry/DLCO - 30 minutes. Do this before your next appointment with me.   I am refilling the Ofev for you with a 90 day supply - sent  to the specialty pharmacy in [place]. Continue 150 mg in the morning and night.    Return to Care: Return in about 6 months (around 05/02/2024) for next available PFT, follow up after.   Durel Salts, MD Pulmonary and Critical Care Medicine [facility] [phone]

## 2023-11-02 NOTE — Patient Instructions (Addendum)
It was a pleasure to see you today!  Please schedule follow up scheduled with myself in 6 months.  If my schedule is not open yet, we will contact you with a reminder closer to that time. Please call [phone] if you haven't heard from [place] a month before, and always call us sooner if issues or concerns arise. You can also send [place] a message through MyChart, but but aware that this is not to be used for urgent issues and it may take up to 5-7 days to receive a reply. Please be aware that you will likely be able to view your results before I have a chance to respond to them. Please give [place] 5 business days to respond to any non-urgent results.    Before your next visit I would like you to have: Spirometry/DLCO - 30 minutes. Do this before your next appointment with me.   I am refilling the Ofev for you with a 90 day supply - sent to the specialty pharmacy in [place]. Continue 150 mg in the morning and night.

## 2023-11-02 NOTE — Addendum Note (Signed)
Addended by: Durel Salts on: 11/02/2023 03:38 PM   Modules accepted: Level of Service

## 2023-11-04 ENCOUNTER — Telehealth: Payer: Self-pay | Admitting: Pharmacist

## 2023-11-04 NOTE — Telephone Encounter (Signed)
Patient will need to reapply for patient assistance through [name]. She felt comfortable allowing [place] to use her previous income documents and signing application on her behalf. Placed in PAP pending info folder with med list, income docs, and PA approval letter  Provider portion has been placed in Dr. Humphrey Rolls mailbox for signature  Chesley Mires, PharmD, MPH, BCPS, CPP Clinical Pharmacist (Rheumatology and Pulmonology)

## 2023-11-04 NOTE — Progress Notes (Signed)
Refill for Ofev sent to [facility] Durel Salts): [phone]  Called patient and provided with phone number to schedule refills.  She will also need to reapply for patient assistance through [facility]. She felt comfortable allowing [place] to use her previous income documents and signing application on her behalf  Chesley Mires, PharmD, MPH, BCPS, CPP Clinical Pharmacist (Rheumatology and Pulmonology)

## 2023-11-05 DIAGNOSIS — I10 Essential (primary) hypertension: Secondary | ICD-10-CM | POA: Diagnosis not present

## 2023-11-05 DIAGNOSIS — E038 Other specified hypothyroidism: Secondary | ICD-10-CM | POA: Diagnosis not present

## 2023-11-06 LAB — MICROALBUMIN / CREATININE URINE RATIO
Creatinine, Urine: 99.3 mg/dL
Microalb/Creat Ratio: 5 mg/g{creat} (ref 0–29)
Microalbumin, Urine: 5.3 ug/mL

## 2023-11-06 LAB — BASIC METABOLIC PANEL
BUN/Creatinine Ratio: 13 (ref 12–28)
BUN: 13 mg/dL (ref 8–27)
CO2: 26 mmol/L (ref 20–29)
Calcium: 9.5 mg/dL (ref 8.7–10.3)
Chloride: 97 mmol/L (ref 96–106)
Creatinine, Ser: 0.98 mg/dL (ref 0.57–1.00)
Glucose: 80 mg/dL (ref 70–99)
Potassium: 4.1 mmol/L (ref 3.5–5.2)
Sodium: 138 mmol/L (ref 134–144)
eGFR: 57 mL/min/{1.73_m2} — ABNORMAL LOW (ref >59)

## 2023-11-06 LAB — TSH+FREE T4
Free T4: 1.47 ng/dL (ref 0.82–1.77)
TSH: 4.1 u[IU]/mL (ref 0.450–4.500)

## 2023-11-08 ENCOUNTER — Encounter: Payer: Self-pay | Admitting: Family Medicine

## 2023-11-08 NOTE — Progress Notes (Signed)
Please mail to patient thank you ?

## 2023-11-16 ENCOUNTER — Telehealth: Payer: Self-pay | Admitting: Hematology

## 2023-11-16 NOTE — Telephone Encounter (Signed)
 Left patient a vm regarding upcoming appointment

## 2023-11-18 NOTE — Telephone Encounter (Signed)
Received signed provdier form. Submitted Patient Assistance RENEWAL Application to [facility] [employer] for OFEV along with provider portion, patient portion, PA, medication list, insurance card copy and income documents. Will update patient when we receive a response.  Phone #: [phone] Fax #: [phone]  Chesley Mires, PharmD, MPH, BCPS, CPP Clinical Pharmacist (Rheumatology and Pulmonology)

## 2023-12-02 DIAGNOSIS — H02831 Dermatochalasis of right upper eyelid: Secondary | ICD-10-CM | POA: Diagnosis not present

## 2023-12-02 DIAGNOSIS — H02834 Dermatochalasis of left upper eyelid: Secondary | ICD-10-CM | POA: Diagnosis not present

## 2023-12-03 ENCOUNTER — Ambulatory Visit: Payer: PPO | Admitting: Hematology

## 2023-12-03 ENCOUNTER — Other Ambulatory Visit: Payer: PPO

## 2023-12-08 ENCOUNTER — Other Ambulatory Visit: Payer: Self-pay

## 2023-12-08 DIAGNOSIS — D72820 Lymphocytosis (symptomatic): Secondary | ICD-10-CM

## 2023-12-09 ENCOUNTER — Inpatient Hospital Stay: Payer: PPO | Attending: Hematology

## 2023-12-09 ENCOUNTER — Inpatient Hospital Stay (HOSPITAL_BASED_OUTPATIENT_CLINIC_OR_DEPARTMENT_OTHER): Payer: PPO | Admitting: Hematology

## 2023-12-09 VITALS — BP 152/87 | HR 74 | Temp 97.2°F | Resp 20 | Wt 192.1 lb

## 2023-12-09 DIAGNOSIS — E039 Hypothyroidism, unspecified: Secondary | ICD-10-CM | POA: Diagnosis not present

## 2023-12-09 DIAGNOSIS — M8580 Other specified disorders of bone density and structure, unspecified site: Secondary | ICD-10-CM | POA: Diagnosis not present

## 2023-12-09 DIAGNOSIS — Z79899 Other long term (current) drug therapy: Secondary | ICD-10-CM | POA: Diagnosis not present

## 2023-12-09 DIAGNOSIS — I4891 Unspecified atrial fibrillation: Secondary | ICD-10-CM | POA: Insufficient documentation

## 2023-12-09 DIAGNOSIS — D72820 Lymphocytosis (symptomatic): Secondary | ICD-10-CM | POA: Insufficient documentation

## 2023-12-09 DIAGNOSIS — Z7901 Long term (current) use of anticoagulants: Secondary | ICD-10-CM | POA: Insufficient documentation

## 2023-12-09 LAB — CBC WITH DIFFERENTIAL (CANCER CENTER ONLY)
Abs Immature Granulocytes: 0.05 10*3/uL (ref 0.00–0.07)
Basophils Absolute: 0 10*3/uL (ref 0.0–0.1)
Basophils Relative: 0 %
Eosinophils Absolute: 0.3 10*3/uL (ref 0.0–0.5)
Eosinophils Relative: 2 %
HCT: 39.7 % (ref 36.0–46.0)
Hemoglobin: 13.2 g/dL (ref 12.0–15.0)
Immature Granulocytes: 0 %
Lymphocytes Relative: 57 %
Lymphs Abs: 7.1 10*3/uL — ABNORMAL HIGH (ref 0.7–4.0)
MCH: 30.5 pg (ref 26.0–34.0)
MCHC: 33.2 g/dL (ref 30.0–36.0)
MCV: 91.7 fL (ref 80.0–100.0)
Monocytes Absolute: 0.9 10*3/uL (ref 0.1–1.0)
Monocytes Relative: 7 %
Neutro Abs: 4.4 10*3/uL (ref 1.7–7.7)
Neutrophils Relative %: 34 %
Platelet Count: 377 10*3/uL (ref 150–400)
RBC: 4.33 MIL/uL (ref 3.87–5.11)
RDW: 13 % (ref 11.5–15.5)
Smear Review: NORMAL
WBC Count: 12.7 10*3/uL — ABNORMAL HIGH (ref 4.0–10.5)
nRBC: 0 % (ref 0.0–0.2)

## 2023-12-09 LAB — CMP (CANCER CENTER ONLY)
ALT: 9 U/L (ref 0–44)
AST: 17 U/L (ref 15–41)
Albumin: 4 g/dL (ref 3.5–5.0)
Alkaline Phosphatase: 62 U/L (ref 38–126)
Anion gap: 6 (ref 5–15)
BUN: 20 mg/dL (ref 8–23)
CO2: 33 mmol/L — ABNORMAL HIGH (ref 22–32)
Calcium: 9.8 mg/dL (ref 8.9–10.3)
Chloride: 97 mmol/L — ABNORMAL LOW (ref 98–111)
Creatinine: 1.07 mg/dL — ABNORMAL HIGH (ref 0.44–1.00)
GFR, Estimated: 51 mL/min — ABNORMAL LOW (ref >60)
Glucose, Bld: 91 mg/dL (ref 70–99)
Potassium: 4 mmol/L (ref 3.5–5.1)
Sodium: 136 mmol/L (ref 135–145)
Total Bilirubin: 0.5 mg/dL (ref 0.0–1.2)
Total Protein: 7.5 g/dL (ref 6.5–8.1)

## 2023-12-09 LAB — LACTATE DEHYDROGENASE: LDH: 163 U/L (ref 98–192)

## 2023-12-09 NOTE — Progress Notes (Signed)
 [facility]   HEMATOLOGY/ONCOLOGY CLINIC NOTE  Date of Service: 12/09/2023  Patient Care Team: [name], MD as PCP - General (Family Medicine) [name], Jerel, MD as PCP - Cardiology (Cardiology) Shaaron Lamar HERO, MD as Consulting Physician (Gastroenterology) Onesimo Emaline Brink, MD as Consulting Physician (Hematology)  CHIEF COMPLAINTS/PURPOSE OF CONSULTATION:  B-cell chronic lymphoproliferative disorder  HISTORY OF PRESENTING ILLNESS:   [name] is a wonderful 88 y.o. female who has been referred to us  by Dr .Alphonsa, Glendia LABOR, MD for evaluation and management of leukocytosis.  She has a history of hiatal hernia, hypertension, dyslipidemia, hypothyroidism who was noted to have lower urinary tract infection for which she was treated in urgent care on 12/03/2021 with ciprofloxacin  and lower respiratory tract infection for which she visited urgent care on 1/15-2023 for which she was treated with doxycycline  albuterol  as needed and Mucinex .  Patient was also noted to have atrial fibrillation since her pneumonia and has been placed on Eliquis . She had a CT of the chest without contrast with Dr. Alphonsa in light of persistent radiographic changes of pneumonia. This showed cardiomegaly, moderate-sized hiatal hernia and mild bilateral patchy interstitial lung disease in the peripheral areas compatible with chronic change cannot rule out pneumonitis.  Patient notes that she has been referred for an echocardiogram due to her cardiomegaly.  She also has been placed on diuretics per her primary care physician.  As part of her labs she has been noted to have some leukocytosis ranging between 10-13.7 over the last year with primarily lymphocytosis ranging from 4.5 through 6.9k,  She has been referred to us  for further evaluation regarding this.  Patient currently notes no fevers no chills no night sweats no unexpected weight loss. Still has some fatigue and some mild shortness of breath with  exertion. No reported new lumps or bumps. No reported new abdominal pain or distention.   Patient was seen at an outside hospital in [place] by Dr. Rogers and had a flow cytometry done on 09/05/2021 which showed a monoclonal B-cell population representing 27% of all lymphocytes.  Lymphocyte population lacked CD10 and had questionable dim CD5 expression, expresses pan B7 antigens including CD20.  INTERVAL HISTORY:  [name] is a 88 y.o. female here for evaluation and management of monoclonal B lymphocytosis.   Patient was last seen by me on 12/01/2022 and reported that her possible pulmonary fibrosis was causing issues, including persistent SOB.   Today, she reports that her pulmonary fibrosis continues to be managed by Dr Verdon Gore and is stable. She reports that she began taking OFEV  anti-fibrotic medication 6 months ago. She notes that OFEV  causes diarrhea.   She reports stable back pain and notes that she takes 3 tylenol  tablets a day to manage symptoms.   She denies any new fevers, chills, nights sweats, or new lumps/bumps.   Patient reports intermittently endorsing a a lower abdominal ache in the mornings, causing her to hurl in pain, which resolves after a while. This symptom has been present for a while. She notes that her abdominal pain episode occurred twice this week. Patient notes that in general, the pain does improve after having a bowel movement. She denies constipation and notes having two bowel movements daily.   She has had 3-4 abdominal surgeries previously, and her most recent procedure was in 1980. She notes having a hernia, which was repaired in 1980. Patient reports mild abdominal pain on palpation.   Patient reports that her breathing is a limiting  factor. She reports that she is needing to rest 3 times when walking up to her mailbox which she reports is relatively far away. Patient also reports that she needs to pause to stabilize her breathing after  turning over when lying down as well. She reports that she does not require an oxygen unit and notes that her blood oxygen level is generally 93% even while walking.   Patient reports an episode one week ago in which her bilateral eyes suddenly went white. She notes that she felt like she would have a syncope episode, though it did not occur. Her eyes were evaluated by her eye doctor and she was told that she was not found to have Macular degeneration or Glaucoma.   Patient reports a history of fractured wrist.   MEDICAL HISTORY:  Past Medical History:  Diagnosis Date   Chronic Leukemia    Depression    H/O hiatal hernia    Hernia    Hernia    right inguinal hernia   Hypertension    Hypothyroidism    Leg swelling    Osteitis pubis (HCC) 05/30/2001   Osteopenia 03/30/2009   Osteopenia 07/30/2022   Bone density 2021 showed elevated FRAX patient was started on Fosamax    Thyroid  disease     SURGICAL HISTORY: Past Surgical History:  Procedure Laterality Date   ABDOMINAL HYSTERECTOMY  03/1970   APPENDECTOMY  03/1966   BILATERAL SALPINGOOPHORECTOMY     CARDIOVERSION N/A 03/10/2022   Procedure: CARDIOVERSION;  Surgeon: Francyne Headland, MD;  Location: [facility];  Service: Cardiovascular;  Laterality: N/A;   CATARACT EXTRACTION W/PHACO Right 09/25/2014   Procedure: CATARACT EXTRACTION PHACO AND INTRAOCULAR LENS PLACEMENT (IOC);  Surgeon: Oneil T. Roz, MD;  Location: [facility];  Service: Ophthalmology;  Laterality: Right;  CDE:7.56   CATARACT EXTRACTION W/PHACO Left 10/09/2014   Procedure: CATARACT EXTRACTION PHACO AND INTRAOCULAR LENS PLACEMENT LEFT EYE CDE=6.51;  Surgeon: Oneil T. Roz, MD;  Location: [facility];  Service: Ophthalmology;  Laterality: Left;   CHOLECYSTECTOMY  1980   COLONOSCOPY  1999   COLONOSCOPY  March 2007   Repeat 10 years   COLONOSCOPY  11/06/2003    Internal and external hemorrhoids/Two tiny rectal distal sigmoid polyps cold biopsied/Otherwise within normal limits  to the cecum   COLONOSCOPY N/A 08/29/2014   Dr. Shaaron: Sessile polyp was found at the cecum status post cold snare polypectomy, tubular adenoma. No further surveillance unless clinical status changes   ESOPHAGOGASTRODUODENOSCOPY  2007   Dr. 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   Widowed since 2021/07/19. Husband W.E. Tsui, died from Kidney Cancer.   Son-Tommy lives nearby.   2 grandson's deceased, Clint and Penne. Penne passed 02/2022 from Brain Cancer.    Social Drivers of Corporate Investment Banker Strain: Low Risk  (07/02/2023)   Overall Financial Resource Strain (CARDIA)    Difficulty of Paying Living Expenses: Not hard at all  Food Insecurity: No Food Insecurity (07/02/2023)   Hunger Vital Sign    Worried About Running Out of Food in the Last Year: Never true    Ran Out of Food in the Last Year: Never true  Transportation Needs: No Transportation Needs (07/02/2023)   PRAPARE - Administrator, Civil Service (Medical): No    Lack of Transportation (Non-Medical): No  Physical Activity: Insufficiently Active (07/02/2023)    Exercise Vital Sign    Days of Exercise per Week: 7 days    Minutes of Exercise per Session: 20 min  Stress: No Stress Concern Present (07/02/2023)   [employer] of Occupational Health - Occupational Stress Questionnaire    Feeling of Stress : Not at all  Social Connections: Moderately Integrated (07/02/2023)   Social Connection and Isolation Panel [NHANES]    Frequency of Communication with Friends and Family: More than three times a week    Frequency of Social Gatherings with Friends and Family: More than three times a week    Attends Religious Services: More than 4 times per year    Active Member of [employer] or Organizations: Yes    Attends Banker Meetings: More than 4 times per year    Marital Status: Widowed  Intimate Partner Violence: Not At Risk (07/02/2023)   Humiliation, Afraid, Rape, and Kick questionnaire    Fear of Current or Ex-Partner: No    Emotionally Abused: No    Physically Abused: No    Sexually Abused: No    FAMILY HISTORY: Family History  Problem Relation Age of Onset   Dementia Mother    Heart disease Father    Cancer Father        throat   Cancer Sister        breast   Colon cancer Neg Hx    Lung disease Neg Hx    Autoimmune disease Neg Hx     ALLERGIES:  is allergic to erythromycin, aspirin, and ibuprofen.  MEDICATIONS:  Current Outpatient Medications  Medication Sig Dispense Refill   acetaminophen  (TYLENOL ) 650 MG CR tablet Take 650 mg by mouth every 8 (eight) hours as needed for pain.     albuterol  (VENTOLIN  HFA) 108 (90 Base) MCG/ACT inhaler INHALE 2 PUFFS INTO THE LUNGS EVERY 6 HOURS AS NEEDED FOR WHEEZING OR SHORTNESS OF BREATH 18 g 1   alendronate  (FOSAMAX ) 70 MG tablet TAKE 1 TABLET(70 MG) BY MOUTH EVERY 7 DAYS WITH A FULL GLASS OF WATER  AND ON AN EMPTY STOMACH 4 tablet 3   ALPRAZolam  (XANAX ) 0.5 MG tablet TAKE 1 TABLET BY MOUTH EVERY NIGHT AT BEDTIME. MAY TAKE 1/2 TABLET DURING THE DAY AS NEEDED 45 tablet 2   apixaban  (ELIQUIS ) 5  MG TABS tablet TAKE 1 TABLET(5 MG) BY MOUTH TWICE DAILY. FOLLOW-UP 180 tablet 1   Cholecalciferol (VITAMIN D3) 2000 UNITS TABS Take 4,000 Units by mouth daily.     diclofenac  Sodium (VOLTAREN ) 1 % GEL Apply 2 g topically 4 (four) times daily. 50 g 0   fluconazole  (DIFLUCAN ) 150 MG tablet  Take 1 now and and take 1 in 5 days 2 tablet 0   indapamide  (LOZOL ) 1.25 MG tablet TAKE 1 TABLET BY MOUTH EVERY MORNING 90 tablet 0   ketoconazole  (NIZORAL ) 2 % cream Apply a thin amount twice daily underneath the breasts for tinea 60 g 4   levothyroxine  (SYNTHROID ) 125 MCG tablet TAKE 1 TABLET BY MOUTH DAILY 90 tablet 1   metoprolol  succinate (TOPROL -XL) 25 MG 24 hr tablet TAKE 1 TABLET BY MOUTH DAILY 90 tablet 1   Multiple Vitamin (MULTI-VITAMIN DAILY PO) Take 1 tablet by mouth daily.     Nintedanib (OFEV ) 150 MG CAPS Take 1 capsule (150 mg total) by mouth 2 (two) times daily. 180 capsule 1   pantoprazole  (PROTONIX ) 40 MG tablet TAKE 1 TABLET(40 MG) BY MOUTH DAILY 90 tablet 1   potassium chloride  (KLOR-CON  M) 10 MEQ tablet TAKE 1 TABLET(10 MEQ) BY MOUTH DAILY 90 tablet 1   sertraline  (ZOLOFT ) 100 MG tablet TAKE 1 TABLET BY MOUTH EVERY DAY 90 tablet 1   No current facility-administered medications for this visit.    REVIEW OF SYSTEMS:    10 Point review of Systems was done is negative except as noted above.   PHYSICAL EXAMINATION: ECOG PERFORMANCE STATUS: 1 - Symptomatic but completely ambulatory  . Vitals:   12/09/23 1026  BP: (!) 152/87  Pulse: 74  Resp: 20  Temp: (!) 97.2 F (36.2 C)  SpO2: 97%    Filed Weights   12/09/23 1026  Weight: 192 lb 1.6 oz (87.1 kg)   .Body mass index is 31.48 kg/m.   GENERAL:alert, in no acute distress and comfortable SKIN: no acute rashes, no significant lesions EYES: conjunctiva are pink and non-injected, sclera anicteric OROPHARYNX: MMM, no exudates, no oropharyngeal erythema or ulceration NECK: supple, no JVD LYMPH:  no palpable lymphadenopathy in  the cervical, axillary or inguinal regions LUNGS: clear to auscultation b/l with normal respiratory effort HEART: regular rate & rhythm ABDOMEN:  normoactive bowel sounds , non tender, not distended. Extremity: no pedal edema PSYCH: alert & oriented x 3 with fluent speech NEURO: no focal motor/sensory deficits   LABORATORY DATA:  I have reviewed the data as listed  .    Latest Ref Rng & Units 12/09/2023   10:10 AM 08/17/2023   11:11 AM 12/01/2022   12:04 PM  CBC  WBC 4.0 - 10.5 K/uL 12.7  11.7  11.6   Hemoglobin 12.0 - 15.0 g/dL 86.7  86.8  87.5   Hematocrit 36.0 - 46.0 % 39.7  40.6  36.8   Platelets 150 - 400 K/uL 377  388  352   .CBC    Component Value Date/Time   WBC 12.7 (H) 12/09/2023 1010   WBC 11.0 (H) 02/02/2022 1217   RBC 4.33 12/09/2023 1010   HGB 13.2 12/09/2023 1010   HGB 13.1 08/17/2023 1111   HCT 39.7 12/09/2023 1010   HCT 40.6 08/17/2023 1111   PLT 377 12/09/2023 1010   PLT 388 08/17/2023 1111   MCV 91.7 12/09/2023 1010   MCV 93 08/17/2023 1111   MCH 30.5 12/09/2023 1010   MCHC 33.2 12/09/2023 1010   RDW 13.0 12/09/2023 1010   RDW 12.9 08/17/2023 1111   LYMPHSABS 7.1 (H) 12/09/2023 1010   LYMPHSABS 5.7 (H) 08/17/2023 1111   MONOABS 0.9 12/09/2023 1010   EOSABS 0.3 12/09/2023 1010   EOSABS 0.4 08/17/2023 1111   BASOSABS 0.0 12/09/2023 1010   BASOSABS 0.1 08/17/2023 1111  Latest Ref Rng & Units 12/09/2023   10:10 AM 11/05/2023    9:43 AM 08/17/2023   11:11 AM  CMP  Glucose 70 - 99 mg/dL 91  80    BUN 8 - 23 mg/dL 20  13    Creatinine 9.55 - 1.00 mg/dL 8.92  9.01    Sodium 864 - 145 mmol/L 136  138    Potassium 3.5 - 5.1 mmol/L 4.0  4.1    Chloride 98 - 111 mmol/L 97  97    CO2 22 - 32 mmol/L 33  26    Calcium 8.9 - 10.3 mg/dL 9.8  9.5    Total Protein 6.5 - 8.1 g/dL 7.5   7.1   Total Bilirubin 0.0 - 1.2 mg/dL 0.5   0.3   Alkaline Phos 38 - 126 U/L 62   87   AST 15 - 41 U/L 17   22   ALT 0 - 44 U/L 9   12    SURGICAL PATHOLOGY Surgical  Pathology  CASE: [id number]  PATIENT: [name]  Flow Pathology Report      Clinical history: Lymphocytosis      DIAGNOSIS:   -Monoclonal B-cell population identified  -See comment   COMMENT:   Flow cytometric analysis of the lymphoid population shows a monoclonal,  lambda restricted B-cell population representing 27% of all lymphocytes.  This population expresses pan-B-cell antigens including CD20 with lack  of CD10, CD25 or CD103 expression.  There is questionable dim CD5  expression.  The findings are consistent with a B-cell  lymphoproliferative process and may represent monoclonal B-cell  lymphocytosis especially in the absence of lymphadenopathy, organomegaly  or extra medullary tissue involvement.  Clinical correlation is  recommended.   GATING AND PHENOTYPIC ANALYSIS:   Gated population: Flow cytometric immunophenotyping is performed using  antibodies to the antigens listed in the table below. Electronic gates  are placed around a cell cluster displaying light scatter properties  corresponding to: lymphocytes (total lymphocyte count is 5.6 K/uL)   Abnormal Cells in gated population: 27%   Phenotype of Abnormal Cells: CD19, CD20, CD22, CD200, Lambda    FISH panel done 02/02/2022   RADIOGRAPHIC STUDIES: I have personally reviewed the radiological images as listed and agreed with the findings in the report.  No results found.  ASSESSMENT & PLAN:  88 year old female with  #1 Mild lymphocytosis with evidence of B-cell chronic lymphoproliferative process- Likely Monoclonal B lymphocytosis Flow cytometry in October 2022 showed 27% of lymphocytes represented a monoclonal population that had pan B-cell antigens including CD20, negative for CD10 and had questionable dim CD5 expression. PET/CT 05/13/2022-No enlarged or hypermetabolic lymphadenopathy is identified in the neck, chest, abdomen or pelvis. No abnormal osseous findings.  #2 possible pulmonary  fibrosis- following up with her Pulmonologist Dr Verdon Gore.   PLAN:  -Discussed lab results on 12/09/23 in detail with patient. CBC stable, showed WBC of 12.7K, hemoglobin of 13.2, and platelets of 377K. -WBCs slightly elevated, though stable -RBCs and platelets normal -patient began taking OFEV  anti-fibrotic treatment 6 months ago for management of pulmonary fibrosis  -patient did not appear to have any enlarged lymph nodes, liver, or spleen based on physical examination -No clinical sign or lab evidence of CLL progression at this time -possible that lower abdominal pain could be related to scar tissue  -advised patient to connect with her PCP to address her lower abdominal pain.  -patient shall follow-up with us  in one year -continue to follow up with  Dr Verdon Gore for management of pulmonary fibrosis.   FOLLOW-UP: RTC with Dr Onesimo with labs in 12 months  The total time spent in the appointment was 23 minutes* .  All of the patient's questions were answered with apparent satisfaction. The patient knows to call the clinic with any problems, questions or concerns.   Emaline Onesimo MD MS AAHIVMS [facility] Hematology/Oncology Physician [facility]  .*Total Encounter Time as defined by the Centers for Medicare and Medicaid Services includes, in addition to the face-to-face time of a patient visit (documented in the note above) non-face-to-face time: obtaining and reviewing outside history, ordering and reviewing medications, tests or procedures, care coordination (communications with other health care professionals or caregivers) and documentation in the medical record.    I,Mitra Faeizi,acting as a neurosurgeon for Emaline Onesimo, MD.,have documented all relevant documentation on the behalf of Emaline Onesimo, MD,as directed by  Emaline Onesimo, MD while in the presence of Emaline Onesimo, MD.  .I have reviewed the above documentation for accuracy and completeness, and I agree with the  above. .Shamecka Hocutt Kishore Jhoselin Crume MD

## 2023-12-10 ENCOUNTER — Telehealth: Payer: Self-pay | Admitting: Hematology

## 2023-12-10 NOTE — Telephone Encounter (Signed)
 Left patient a vm regarding upcoming appointmenets

## 2023-12-23 DIAGNOSIS — Z85828 Personal history of other malignant neoplasm of skin: Secondary | ICD-10-CM | POA: Diagnosis not present

## 2023-12-23 DIAGNOSIS — Z08 Encounter for follow-up examination after completed treatment for malignant neoplasm: Secondary | ICD-10-CM | POA: Diagnosis not present

## 2023-12-26 ENCOUNTER — Other Ambulatory Visit: Payer: Self-pay | Admitting: Family Medicine

## 2024-01-14 ENCOUNTER — Other Ambulatory Visit: Payer: Self-pay | Admitting: Family Medicine

## 2024-01-18 ENCOUNTER — Other Ambulatory Visit: Payer: Self-pay | Admitting: Family Medicine

## 2024-01-18 NOTE — Telephone Encounter (Signed)
Called [employer] [facility] to check status of PAP application for OFEV. Per automated system patient's application has been approved until 11/29/2024. Attempted to call patient to discuss this but was unable to reach her. LVM with pharmacist call back number.   Sofie Rower, PharmD [facility] PGY-1

## 2024-02-05 ENCOUNTER — Other Ambulatory Visit: Payer: Self-pay | Admitting: Family Medicine

## 2024-02-11 ENCOUNTER — Other Ambulatory Visit: Payer: Self-pay

## 2024-02-11 MED ORDER — INDAPAMIDE 1.25 MG PO TABS: 1.2500 mg | ORAL_TABLET | Freq: Every morning | ORAL | 0 refills | Status: DC

## 2024-02-14 ENCOUNTER — Other Ambulatory Visit: Payer: Self-pay

## 2024-02-14 ENCOUNTER — Telehealth: Payer: Self-pay | Admitting: Family Medicine

## 2024-02-14 MED ORDER — LEVOTHYROXINE SODIUM 125 MCG PO TABS: ORAL_TABLET | ORAL | 1 refills | Status: DC

## 2024-02-14 NOTE — Telephone Encounter (Signed)
 Refill on levothyroxine (SYNTHROID) 125 MCG tablet  send to [facility]

## 2024-02-17 ENCOUNTER — Encounter: Payer: Self-pay | Admitting: Family Medicine

## 2024-02-17 ENCOUNTER — Ambulatory Visit (INDEPENDENT_AMBULATORY_CARE_PROVIDER_SITE_OTHER): Payer: PPO | Admitting: Family Medicine

## 2024-02-17 VITALS — BP 116/82 | HR 72 | Temp 98.3°F | Ht 65.5 in | Wt 189.0 lb

## 2024-02-17 DIAGNOSIS — E7849 Other hyperlipidemia: Secondary | ICD-10-CM

## 2024-02-17 DIAGNOSIS — E039 Hypothyroidism, unspecified: Secondary | ICD-10-CM | POA: Diagnosis not present

## 2024-02-17 DIAGNOSIS — I5032 Chronic diastolic (congestive) heart failure: Secondary | ICD-10-CM | POA: Diagnosis not present

## 2024-02-17 DIAGNOSIS — D479 Neoplasm of uncertain behavior of lymphoid, hematopoietic and related tissue, unspecified: Secondary | ICD-10-CM | POA: Diagnosis not present

## 2024-02-17 DIAGNOSIS — I4819 Other persistent atrial fibrillation: Secondary | ICD-10-CM

## 2024-02-17 DIAGNOSIS — J84112 Idiopathic pulmonary fibrosis: Secondary | ICD-10-CM | POA: Diagnosis not present

## 2024-02-17 DIAGNOSIS — I10 Essential (primary) hypertension: Secondary | ICD-10-CM

## 2024-02-17 NOTE — Progress Notes (Signed)
 Subjective:    Patient ID: [name], female    DOB: Apr 17, 1936, 88 y.o.   MRN: [id number]  Discussed the use of AI scribe software for clinical note transcription with the patient, who gave verbal consent to proceed.  History of Present Illness   [name] is a 88 year old female who presents with shortness of breath and decreased exercise tolerance.  She experiences shortness of breath and fatigue within ten minutes of physical activity, such as gardening or walking. She can no longer dig in the garden and has to limit her gardening activities. Difficulty breathing and feeling winded quickly have led her to reduce her outdoor activities.  She has a history of lung issues and is monitored by her oncologist and lung doctor. Her blood work, including kidney function and electrolytes, has been stable. Her white blood count is slightly elevated but has remained consistent over the past two and a half years.  She uses a walking staff for balance and can walk for about ten minutes around her yard. She used to walk longer distances but now finds it challenging due to her breathing difficulties. She has set up chairs around her yard to rest when needed.  She takes alprazolam (Xanax) at night and sometimes during the day, depending on how she feels. It used to help her sleep quickly, but now she tosses and turns before settling down.  Her mood is described as 'pretty good' and her family checks in on her regularly.  She has concerns about falling and has considered getting a device to wear around her neck for safety. She cannot get up without assistance if she falls in the yard.      Lab work from previous blood draw was reviewed with patient in detail We will overall of her medicines answered questions updated refills  Review of Systems     Objective:    Physical Exam   VITALS: BP- 116/82 CARDIOVASCULAR: Blood pressure 116/82 mmHg     Lungs clear bilateral extremities no  edema skin warm dry blood pressure good on recheck      Assessment & Plan:  Assessment and Plan    Dyspnea on exertion Dyspnea on exertion with compensatory cardiovascular response due to lung condition. - Continue walking for ten minutes around her yard, three times daily. - Use a walking staff for balance. - Pace activities and take breaks.  Lung condition (unspecified) Unspecified lung condition monitored by a specialist, contributing to dyspnea. - Attend follow-up with lung specialist on March 25th. - Consider trial participation as discussed.  Cardiac condition (unspecified) Cardiac condition with good heart function; compensatory heart rate and blood pressure changes due to lung condition. - Continue blood thinner as prescribed. - Monitor blood pressure regularly.  Anxiety Anxiety managed with alprazolam, occasional sleep difficulty. - Continue alprazolam regimen, one at night and half during the day as needed.  General Health Maintenance Stable blood work with consistent slight elevation in white blood cell count. - No blood work required at this visit.      1. IPF (idiopathic pulmonary fibrosis) (HCC) She is dealing with the stress of this well looking at possible research treatment starting up soon  2. Lymphoproliferative disease (HCC) White blood count staying in the low elevated range being monitored by oncology on a yearly basis  3. Chronic diastolic (congestive) heart failure (HCC) No sign of overt failure currently this is chronic diastolic patient does get out of breath with activity but also  related to her lungs  4. Persistent atrial fibrillation (HCC) Heart rate is controlled not tachycardic continue current measures including anticoagulants no bleeding issues Follow-up in 4 months

## 2024-02-18 NOTE — Addendum Note (Signed)
 Addended by: Sande Brothers on: 02/18/2024 11:11 AM   Modules accepted: Orders

## 2024-02-21 ENCOUNTER — Other Ambulatory Visit: Payer: Self-pay | Admitting: Family Medicine

## 2024-02-22 ENCOUNTER — Encounter

## 2024-02-24 ENCOUNTER — Encounter: Admitting: Internal Medicine

## 2024-02-24 DIAGNOSIS — J84112 Idiopathic pulmonary fibrosis: Secondary | ICD-10-CM

## 2024-02-24 NOTE — Research (Signed)
 Title: A Phase 2 Multicenter, Randomized, Double-blind, Placebo Controlled Study to Evaluate the Safety, Tolerability, Pharmacokinetics, and Efficacy of TTI-101 in Participants with Idiopathic Pulmonary Fibrosis    Dose and Duration of Treatment: 75 participants will be enrolled using a 1:1:1 randomization ratio over a 20 week period (4 week screening period, 12 week treatment period and a 4 week follow-up period).   25 participants: TTI-101 400 mg/day   25 participants: TTI-101 800 mg/day   25 participants: placebo   Protocol # TVD-101-003P    Sponsor: [employer], [address] [employer] [address] [place], [place] [address] [place]  Protocol Version/Amendment: 4.0, date  07 Jul 2023,  for 02/24/2024 date  Consent Version: ver. Revised 07 Oct 2023  for 02/24/2024 date   Investigator Brochure Version: Edition Number 8.0  for 02/24/2024 date of 09 Oct 2022  Investigator Brochure Product: TTI-101        Mechanism of action: TTI-101 is a small-molecule inhibitor of STAT3 (signal transducer and activator of transcription 3). STAT3 plays a major role in the cellular processes involved in fibrosis including fibroblast proliferation. TTI-101 binds tightly to STAT3 which prevents STAT3 recruitment to signaling complexes that contain activated tyrosine kinases, thereby preventing STAT3 phosphorylation on Y705 and STAT3 activation. Binding of TTI-101 to STAT3 also prevents STAT3 dimerization. As such, TTI-101 is expected to prevent or reverse progression of fibrosis in IPF patients.  Key Inclusion Criteria: Age >= 40 years  Diagnosed with IPF based on the 2018 or 2022 ATS/ERS/JRS/ALAT guidelines within 5 years  Chest HRCT performed within 12 months meeting requirements for IPF Extent of fibrosis > emphysematous changes on the HRCT Meeting all the following during screening confirmed by central review,   >40% of predicted FVC and a (FEV1)/FVC >=0.7  A predicted DLCO (Hb corrected) >25%   SpO2 >= 88% with up to 4L O2/min by pulse oximetry at rest  If currently receiving nintedanib, stable dose for >3 months  Key Exclusion Criteria: Known to have the following diseases at screening: Uncontrolled pulmonary hypertension, or pulmonary hypertension requiring chronic medical treatment Congestive heart failure - NYHA Class III or IV   Drug-induced interstitial pneumonia, pneumoconiosis, hypersensitivity pneumonitis, or radiation pneumonitis   Collagen vascular disease with involvement of the lung, autoimmune disease with involvement of the lung, sarcoidosis, granulomatous disease Severe hypertension (BP >= 160/100 despite maximal therapy within 3 months of visit 1) Myocardial infarction, unstable cardiac angina, or history of thrombotic event within 6 months of screening QTcF >450 msec for men and >470 msec for women at screening.   History of significant/symptomatic bradycardia long QT syndrome or  Uncorrected hypomagnesemia or hypokalemia or Heart rate <50 bpm at rest   Unresolved respiratory tract infection within 4 weeks or an acute exacerbation of IPF within 3 months  Active cancer or a history of cancer with a significant risk of recurrence during the study.  eGFR <=32mL/min/1.73 m2, Hb<=10g/dL, ZHY<8657/QI6 , platelets <100,000/mm3 , serum total bilirubin >1.5 x ULN, liver enzymes >= 2 x ULN Receiving steroids > 10 mg/day of prednisolone or its equivalent within 2 weeks   Immunosuppressive agents within 4 weeks  Received pirfenidone within 3 months, smoking or vaping within 3 months   Pharmacokinetics: (ADME) - Contraindications: The SEDD formulation for TTI-101 provided greater systemic exposures relative to the Labrasol/PEG formulation. The SEDD formulation decreased the pill burden while increasing systemic exposures. The half-life of the drug in participants generally ranged from 4-8 h. The half-life data are supportive of the  BID dose schedule. The administration of  TTI-101 is limited to investigational use only and limited to the oral route of administration. TTI-101 is contraindicated for use in participants with known hypersensitivity to any of the excipients. Special Warnings/Considerations: Insufficient experience exists with TTI-101 to provide comprehensive warning guidance beyond what is standard for any investigational drug.  Interactions:  Studies indicated the potential for strong induction of CYP1A2 by TTI-101. As expected, TTI-101 did induce metabolism of pirfenidone, known to be primarily metabolized by CYP1A2. There was no interaction shown between TTI-101 and nintedanib. Drug Interaction Studies: [facility] conducted a DDI study in normal healthy volunteers given TTI-101 and either pirfenidone or nintedanib. Serious Adverse Reactions Observed in Clinical studies: 32 participants out of 105 reported SAE. (30.5%). However all SAEs reported were from study 2016-0842 (participants with solid tumors). No SAEs were reported in the DDI study (Healthy volunteer). Majority of SAE were GI disorders.  Serious Adverse Reactions Observed Postmarketing: To date, TTI-101 has not been registered for use or marketed in any jurisdiction. Safety Data:  The safety data from study 2016-0842 demonstrates the most frequent AEs are GI-related ( nausea, vomiting, change in appetite, diarrhea, abdominal pain) and related to liver function (elevated AST and ALT).  Fewer AEs were reported with the SDD formulation. EKG - PR prolongation was seen in 1 out of 105 participants. (1%)    Overall Adverse events in total participants N=105 (%) Severity  Grade1-mild Grade2-Moderate Grade3-Severe Formulation 1  N=15 Formulation 2    N=47 Formulation 3 (SDD)  N=48*  Diarrhea 30 (28.6%) 24 (22.9%) 7 (6.7%) 8 (7.6%) 6 (40.0%) 21 (44.7%) 3 (6.3%)  Nausea 14 (13.3%) 9 (8.6%) 4 (3.8%) 1 (1.0%) 4 (26.7%) 7 (14.9%) 3 (6.3%)  Vomiting 7 (6.7%) 5 (4.8%) 2 (1.9%) - 3 (20.0%) 2  (4.3%) 2 (4.2%)   Abdominal pain 3 (2.9%) 2 (1.9%) 1 (1.0%) - 3 (20.0%) - -  Elevated ALT 7 (6.7%)   4 (3.8%)   3 (2.9%)   3 (2.9%) 2 (13.3%)  4 (8.5%)   1 (2.1%)   Elevated AST 6 (5.7%)   2 (1.9%)   4 (3.8%)   3 (2.9%) 1 (6.7%)  4 (8.5%)  1 (2.1%)   Headache 5 (4.8%) 5 (4.8%) - - - - 5 (10.4%)    TEAE from DDI study alone with Healthy Volunteer N=41 Part 1 nintedanib Part 2 Pirfenidone  Diarrhea  2 (4.9%)     1 (4.8%)  1 (5.0%)  Nausea 2 (4.9%)   1 (4.8%)   1 (5.0%)   Vomiting 1 (2.4%)     -  1 (5.0%)   Abdominal distension 1 (2.4%)     1 (4.8%)  -  Headache 5 (12.2%)   4 (19.0%)  1 (5.0%)    Stability:  The recommended storage condition for TTI-101 tablet, 200 mg and matching placebo is room temperature (20C to 25C or 81F to 64F). Excursions between 15C and 30C (21F and 23F) are allowed.   [facility] Clinical Research Coordinator note:   This visit for Subject [name] with DOB: 01-27-1936 on 02/24/2024 for the above protocol is Visit/Encounter # Screening visit  and is for purpose of research.   The consent for this encounter is under Protocol Version 4.0, Investigator Brochure Version 8.0, Consent Version revised [id number] and  is currently IRB approved.    (Subject/LAR) expressed continued interest and consent in continuing as a study subject. Subject confirmed that there was  no change  in contact information (e.g. address, telephone, email). Subject thanked for participation in research and contribution to science. In this visit 02/24/2024 the subject will be evaluated by  Sub-Investigator named  Marga Melnick MD. This research coordinator has verified that the above investigator is  up to date with his/her training logs.   The subject was  informed that the PI  continues to have oversight of the subject's visits and course through relevant discussions, reviews, and also specifically of this visit by routing of this note to the PI.   All screening  assessments were completed.  Please see subject binder for further details. Signed by  Christell Constant MD  Clinical Research Coordinator [facility]  [place], [place] 5:08 PM 02/24/2024

## 2024-02-25 ENCOUNTER — Encounter: Payer: Self-pay | Admitting: Internal Medicine

## 2024-02-25 NOTE — Progress Notes (Signed)
 [name], DOB Mar 24, 1936,was seen as subject in a clinical trial /Protocol # TVD-101-003P. Cardiopulmonary symptoms are stable.  She is unaware of her atrial fibrillation. Other or new symptoms denied. Pertinent physical findings include: There is subtle asymmetry of the nasolabial folds with slight sagging characteristic to the right corner of the mouth.  Rhythm is irregular but rate is controlled; clinically she is in persistent atrial fibrillation.  There were minor isolated rales in the right lower lobe.  Superiorly slight bronchovesicular quality to breath sounds noted.There is trace edema at the sock line. All physical findings NCS. In context of the subtle facial asymmetry and persistent A-fib; there is no history of CVA.  She is on Eliquis prophylaxis.  She is on relatively high doses of L-thyroxine at 125 mcg daily.  The most recent TSH was 4.100 on 11/05/2023.  This had increased from 1.860 on 04/14/23.                                                                     Pecola Lawless MD,SI

## 2024-03-03 ENCOUNTER — Encounter: Payer: Self-pay | Admitting: Internal Medicine

## 2024-03-03 DIAGNOSIS — J84112 Idiopathic pulmonary fibrosis: Secondary | ICD-10-CM

## 2024-03-03 NOTE — Research (Signed)
 Title: A Phase 2 Multicenter, Randomized, Double-blind, Placebo Controlled Study to Evaluate the Safety, Tolerability, Pharmacokinetics, and Efficacy of TTI-101 in Participants with Idiopathic Pulmonary Fibrosis    Dose and Duration of Treatment: 75 participants will be enrolled using a 1:1:1 randomization ratio over a 20 week period (4 week screening period, 12 week treatment period and a 4 week follow-up period).   25 participants: TTI-101 400 mg/day   25 participants: TTI-101 800 mg/day   25 participants: placebo   Protocol # TVD-101-003P    Sponsor: [employer], [address] [employer] [address], [address] [place], [place] [address] [place]  Protocol Version/Amendment: 4.0, date  07 Jul 2023,  for 03/03/2024 date  Consent Version: ver. Revised 07 Oct 2023  for 03/03/2024 date   Investigator Brochure Version: Edition Number 8.0  for 09 Oct 2022  Investigator Brochure Product: TTI-101        Mechanism of action: TTI-101 is a small-molecule inhibitor of STAT3 (signal transducer and activator of transcription 3). STAT3 plays a major role in the cellular processes involved in fibrosis including fibroblast proliferation. TTI-101 binds tightly to STAT3 which prevents STAT3 recruitment to signaling complexes that contain activated tyrosine kinases, thereby preventing STAT3 phosphorylation on Y705 and STAT3 activation. Binding of TTI-101 to STAT3 also prevents STAT3 dimerization. As such, TTI-101 is expected to prevent or reverse progression of fibrosis in IPF patients.  Key Inclusion Criteria: Age >= 40 years  Diagnosed with IPF based on the 2018 or 2022 ATS/ERS/JRS/ALAT guidelines within 5 years  Chest HRCT performed within 12 months meeting requirements for IPF Extent of fibrosis > emphysematous changes on the HRCT Meeting all the following during screening confirmed by central review,   >40% of predicted FVC and a (FEV1)/FVC >=0.7  A predicted DLCO (Hb corrected) >25%  SpO2 >= 88% with up to  4L O2/min by pulse oximetry at rest  If currently receiving nintedanib, stable dose for >3 months  Key Exclusion Criteria: Known to have the following diseases at screening: Uncontrolled pulmonary hypertension, or pulmonary hypertension requiring chronic medical treatment Congestive heart failure - NYHA Class III or IV   Drug-induced interstitial pneumonia, pneumoconiosis, hypersensitivity pneumonitis, or radiation pneumonitis   Collagen vascular disease with involvement of the lung, autoimmune disease with involvement of the lung, sarcoidosis, granulomatous disease Severe hypertension (BP >= 160/100 despite maximal therapy within 3 months of visit 1) Myocardial infarction, unstable cardiac angina, or history of thrombotic event within 6 months of screening QTcF >450 msec for men and >470 msec for women at screening.   History of significant/symptomatic bradycardia long QT syndrome or  Uncorrected hypomagnesemia or hypokalemia or Heart rate <50 bpm at rest   Unresolved respiratory tract infection within 4 weeks or an acute exacerbation of IPF within 3 months  Active cancer or a history of cancer with a significant risk of recurrence during the study.  eGFR <=17mL/min/1.73 m2, Hb<=10g/dL, UEA<5409/WJ1 , platelets <100,000/mm3 , serum total bilirubin >1.5 x ULN, liver enzymes >= 2 x ULN Receiving steroids > 10 mg/day of prednisolone or its equivalent within 2 weeks   Immunosuppressive agents within 4 weeks  Received pirfenidone within 3 months, smoking or vaping within 3 months   Pharmacokinetics: (ADME) - Contraindications: The SEDD formulation for TTI-101 provided greater systemic exposures relative to the Labrasol/PEG formulation. The SEDD formulation decreased the pill burden while increasing systemic exposures. The half-life of the drug in participants generally ranged from 4-8 h. The half-life data are supportive of the BID dose schedule.  The administration of TTI-101 is limited to  investigational use only and limited to the oral route of administration. TTI-101 is contraindicated for use in participants with known hypersensitivity to any of the excipients. Special Warnings/Considerations: Insufficient experience exists with TTI-101 to provide comprehensive warning guidance beyond what is standard for any investigational drug.  Interactions:  Studies indicated the potential for strong induction of CYP1A2 by TTI-101. As expected, TTI-101 did induce metabolism of pirfenidone, known to be primarily metabolized by CYP1A2. There was no interaction shown between TTI-101 and nintedanib. Drug Interaction Studies: [facility] conducted a DDI study in normal healthy volunteers given TTI-101 and either pirfenidone or nintedanib. Serious Adverse Reactions Observed in Clinical studies: 32 participants out of 105 reported SAE. (30.5%). However all SAEs reported were from study 2016-0842 (participants with solid tumors). No SAEs were reported in the DDI study (Healthy volunteer). Majority of SAE were GI disorders.  Serious Adverse Reactions Observed Postmarketing: To date, TTI-101 has not been registered for use or marketed in any jurisdiction. Safety Data:  The safety data from study 2016-0842 demonstrates the most frequent AEs are GI-related ( nausea, vomiting, change in appetite, diarrhea, abdominal pain) and related to liver function (elevated AST and ALT).  Fewer AEs were reported with the SDD formulation. EKG - PR prolongation was seen in 1 out of 105 participants. (1%)    Overall Adverse events in total participants N=105 (%) Severity  Grade1-mild Grade2-Moderate Grade3-Severe Formulation 1  N=15 Formulation 2    N=47 Formulation 3 (SDD)  N=48*  Diarrhea 30 (28.6%) 24 (22.9%) 7 (6.7%) 8 (7.6%) 6 (40.0%) 21 (44.7%) 3 (6.3%)  Nausea 14 (13.3%) 9 (8.6%) 4 (3.8%) 1 (1.0%) 4 (26.7%) 7 (14.9%) 3 (6.3%)  Vomiting 7 (6.7%) 5 (4.8%) 2 (1.9%) - 3 (20.0%) 2 (4.3%) 2 (4.2%)    Abdominal pain 3 (2.9%) 2 (1.9%) 1 (1.0%) - 3 (20.0%) - -  Elevated ALT 7 (6.7%)   4 (3.8%)   3 (2.9%)   3 (2.9%) 2 (13.3%)  4 (8.5%)   1 (2.1%)   Elevated AST 6 (5.7%)   2 (1.9%)   4 (3.8%)   3 (2.9%) 1 (6.7%)  4 (8.5%)  1 (2.1%)   Headache 5 (4.8%) 5 (4.8%) - - - - 5 (10.4%)    TEAE from DDI study alone with Healthy Volunteer N=41 Part 1 nintedanib Part 2 Pirfenidone  Diarrhea  2 (4.9%)     1 (4.8%)  1 (5.0%)  Nausea 2 (4.9%)   1 (4.8%)   1 (5.0%)   Vomiting 1 (2.4%)     -  1 (5.0%)   Abdominal distension 1 (2.4%)     1 (4.8%)  -  Headache 5 (12.2%)   4 (19.0%)  1 (5.0%)    Stability:  The recommended storage condition for TTI-101 tablet, 200 mg and matching placebo is room temperature (20C to 25C or 13F to 13F). Excursions between 15C and 30C (107F and 13F) are allowed.    Visit Note: Subject returned for unscheduled visit to repeat spirometry and DLCO for [facility] screening.  Assessments completed.  Please see subject binder for details.  Christell Constant MD

## 2024-03-06 ENCOUNTER — Telehealth: Payer: Self-pay | Admitting: Family Medicine

## 2024-03-06 NOTE — Telephone Encounter (Signed)
 Nurses Patient was recently at doctor's office had a abnormal urinalysis I would recommend we repeat urinalysis with a urine culture more than likely will have to put her on antibiotics if she is having any symptoms of UTI.  Please ask her if she having any symptoms of UTI burning discomfort with urination, blood in urine?  If not having any symptoms I would recommend office visit to recheck UA along with doing a culture and possibly may or may not need to be on antibiotics  May have an appointment with myself Eber Jones or Toni Amend thank you

## 2024-03-07 ENCOUNTER — Encounter: Payer: Self-pay | Admitting: Internal Medicine

## 2024-03-08 ENCOUNTER — Encounter: Payer: Self-pay | Admitting: Physician Assistant

## 2024-03-08 ENCOUNTER — Ambulatory Visit: Admitting: Physician Assistant

## 2024-03-08 VITALS — BP 148/78 | HR 76 | Wt 194.0 lb

## 2024-03-08 DIAGNOSIS — N760 Acute vaginitis: Secondary | ICD-10-CM | POA: Diagnosis not present

## 2024-03-08 DIAGNOSIS — J84112 Idiopathic pulmonary fibrosis: Secondary | ICD-10-CM | POA: Diagnosis not present

## 2024-03-08 DIAGNOSIS — B9689 Other specified bacterial agents as the cause of diseases classified elsewhere: Secondary | ICD-10-CM | POA: Insufficient documentation

## 2024-03-08 DIAGNOSIS — N3001 Acute cystitis with hematuria: Secondary | ICD-10-CM | POA: Diagnosis not present

## 2024-03-08 LAB — POCT URINALYSIS DIP (CLINITEK)
Bilirubin, UA: NEGATIVE
Glucose, UA: NEGATIVE mg/dL
Ketones, POC UA: NEGATIVE mg/dL
Nitrite, UA: NEGATIVE
Spec Grav, UA: 1.01 (ref 1.010–1.025)
Urobilinogen, UA: 0.2 U/dL
pH, UA: 6.5 (ref 5.0–8.0)

## 2024-03-08 MED ORDER — METRONIDAZOLE 500 MG PO TABS: 500.0000 mg | ORAL_TABLET | Freq: Two times a day (BID) | ORAL | 0 refills | Status: DC

## 2024-03-08 MED ORDER — CEPHALEXIN 500 MG PO CAPS: 500.0000 mg | ORAL_CAPSULE | Freq: Two times a day (BID) | ORAL | 0 refills | Status: AC

## 2024-03-08 MED ORDER — PREDNISONE 20 MG PO TABS: 40.0000 mg | ORAL_TABLET | Freq: Every day | ORAL | 0 refills | Status: AC

## 2024-03-08 MED ORDER — ALBUTEROL SULFATE HFA 108 (90 BASE) MCG/ACT IN AERS: 2.0000 | INHALATION_SPRAY | Freq: Four times a day (QID) | RESPIRATORY_TRACT | 1 refills | Status: DC | PRN

## 2024-03-08 NOTE — Addendum Note (Signed)
 Addended by: Olga Millers on: 03/08/2024 02:36 PM   Modules accepted: Orders

## 2024-03-08 NOTE — Assessment & Plan Note (Signed)
 Reports increased shortness of breath in relation to pulmonary fibrosis, starting the morning. She has not been using albuterol inhaler as she needs refill. Lungs clear to auscultation bilaterally today, no wheezing, patient speaking in full sentences without difficulty. Will refill albuterol inhaler today and start 5 day burst of prednisone for shortness of breath. Warning signs, reviewed patient to follow up for worsening shortness of breath, difficulty breathing, or chest pain.

## 2024-03-08 NOTE — Progress Notes (Signed)
 Acute Office Visit  Subjective:     Patient ID: [name], female    DOB: [birth year], 88 y.o.   MRN: [id number]   Patient presents today today with concerns for UTI. She reports symptoms began about 1 week ago. She relates urinary frequency and urgency with 1 episode of hematuria. She denies fevers, nausea, vomiting, or flank pain. Patient endorses thin, watery, vaginal discharge with odor for approximately 1 week as well. She endorses shortness of breath that began this morning, related to her pulmonary fibrosis.      Review of Systems  Constitutional:  Negative for chills, fever and weight loss.  Respiratory:  Positive for shortness of breath. Negative for cough and wheezing.   Cardiovascular:  Negative for chest pain and palpitations.  Gastrointestinal:  Negative for nausea and vomiting.  Genitourinary:  Positive for frequency, hematuria and urgency. Negative for dysuria and flank pain.        Objective:     BP (!) 148/78   Pulse 76   Wt 194 lb (88 kg)   SpO2 98%   BMI 31.79 kg/m   Physical Exam Constitutional:      Appearance: Normal appearance.  HENT:     Head: Normocephalic.     Mouth/Throat:     Mouth: Mucous membranes are moist.     Pharynx: Oropharynx is clear.  Cardiovascular:     Rate and Rhythm: Normal rate. Rhythm irregular.     Heart sounds: Normal heart sounds. No murmur heard. Pulmonary:     Effort: Pulmonary effort is normal.     Breath sounds: Normal breath sounds. No wheezing or rales.  Abdominal:     General: Abdomen is flat.     Palpations: Abdomen is soft.     Tenderness: There is no abdominal tenderness. There is no right CVA tenderness or left CVA tenderness.  Genitourinary:    Comments: Pelvic exam deferred Skin:    General: Skin is warm and dry.  Neurological:     General: No focal deficit present.     Mental Status: She is alert and oriented to person, place, and time.  Psychiatric:        Mood and Affect: Mood normal.         Behavior: Behavior normal.     Results for orders placed or performed in visit on 03/08/24  POCT URINALYSIS DIP (CLINITEK)  Result Value Ref Range   Color, UA yellow yellow   Clarity, UA clear clear   Glucose, UA negative negative mg/dL   Bilirubin, UA negative negative   Ketones, POC UA negative negative mg/dL   Spec Grav, UA 2.130 8.657 - 1.025   Blood, UA moderate (A) negative   pH, UA 6.5 5.0 - 8.0   POC PROTEIN,UA trace negative, trace   Urobilinogen, UA 0.2 0.2 or 1.0 E.U./dL   Nitrite, UA Negative Negative   Leukocytes, UA Small (1+) (A) Negative        Assessment & Plan:  Acute cystitis with hematuria Assessment & Plan: Patient appears stable today.  Physical exam without abnormal findings.  No CVA tenderness or suprapubic tenderness.  Urine dipstick done in office today, positve for moderate blood and small leukocytes. Urine sent for culture, will review sensitivities and treat as indicated.  Patient started on Keflex today.  Patient should return to office if she experiences nausea, vomiting, fevers, flank pain. We will follow up in 2-3 weeks for repeat urine to assure hematuria has resolved.   Orders: -  POCT URINALYSIS DIP (CLINITEK) -     Cephalexin; Take 1 capsule (500 mg total) by mouth 2 (two) times daily for 7 days.  Dispense: 14 capsule; Refill: 0  Idiopathic pulmonary fibrosis (HCC) Assessment & Plan: Reports increased shortness of breath in relation to pulmonary fibrosis, starting the morning. She has not been using albuterol inhaler as she needs refill. Lungs clear to auscultation bilaterally today, no wheezing, patient speaking in full sentences without difficulty. Will refill albuterol inhaler today and start 5 day burst of prednisone for shortness of breath. Warning signs, reviewed patient to follow up for worsening shortness of breath, difficulty breathing, or chest pain.   Orders: -     predniSONE; Take 2 tablets (40 mg total) by mouth daily for  5 days.  Dispense: 10 tablet; Refill: 0 -     Albuterol Sulfate HFA; Inhale 2 puffs into the lungs every 6 (six) hours as needed for wheezing or shortness of breath.  Dispense: 18 g; Refill: 1  Bacterial vaginosis Assessment & Plan: Vaginal discharge and odor consistent with BV, pelvic exam deferred today. She denies vaginal bleeding. Will start metronidazole x7 days for symptoms. Patient understands if symptoms persist we will proceed with pelvic exam. Patient to follow up in 2-3 weeks.   Orders: -     metroNIDAZOLE; Take 1 tablet (500 mg total) by mouth 2 (two) times daily.  Dispense: 14 tablet; Refill: 0    Return in about 3 weeks (around 03/29/2024) for follow up on urine.  Toni Amend Tyshauna Finkbiner, PA-C

## 2024-03-08 NOTE — Telephone Encounter (Signed)
 Spoke with patient and scheduled appt

## 2024-03-08 NOTE — Assessment & Plan Note (Signed)
 Patient appears stable today.  Physical exam without abnormal findings.  No CVA tenderness or suprapubic tenderness.  Urine dipstick done in office today, positve for moderate blood and small leukocytes. Urine sent for culture, will review sensitivities and treat as indicated.  Patient started on Keflex today.  Patient should return to office if she experiences nausea, vomiting, fevers, flank pain. We will follow up in 2-3 weeks for repeat urine to assure hematuria has resolved.

## 2024-03-08 NOTE — Assessment & Plan Note (Signed)
 Vaginal discharge and odor consistent with BV, pelvic exam deferred today. She denies vaginal bleeding. Will start metronidazole x7 days for symptoms. Patient understands if symptoms persist we will proceed with pelvic exam. Patient to follow up in 2-3 weeks.

## 2024-03-09 ENCOUNTER — Telehealth: Payer: Self-pay | Admitting: Internal Medicine

## 2024-03-09 NOTE — Telephone Encounter (Signed)
 TVARDI STUDY   [name] -had abnormal urine analysis while undergoing screening protocol for TVARDI REVERT IPF studyu.  This part of her screening labs.  She has not been randomized and she is in the screening phase.[name]  She was seen by Toni Amend Grooms family medicine PA yesterday.  Examination bacterial vaginosis which would be in a AE unrelated to study per study protocol.  She has been given metronidazole for this.  Also UTI was diagnosed.  Urine culture was sent cephalexin has been started.  In addition patient reported recent worsening of shortness of breath.  In discussion with Toni Amend Grooms and reviewed the records there is no cough or any other change.  Has been increased pollen.  She not been using her albuterol.  Her research FVC on 4/4//25 was 1.71 L very similar to her baseline a year ago.  Given the absence of other findings and potentially related to pollen took a shared decision making with Toni Amend Grooms to hold off on the prednisone's patient was prescribed.  Research coordinator has updated the patient about this.  Patient will continue to monitor the symptoms     SIGNATURE    Dr. Kalman Shan, M.D., F.C.C.P,  Pulmonary and Critical Care Medicine Staff Physician, [facility] Center Director - Interstitial Lung Disease  Program  Pulmonary Fibrosis [facility] at [facility] [place], [place], [address]   Pager: [phone], If no answer  -> Check AMION or Try [phone] Telephone (clinical office): [phone] Telephone (research): [phone]  5:15 PM 03/09/2024

## 2024-03-13 ENCOUNTER — Encounter: Payer: Self-pay | Admitting: Physician Assistant

## 2024-03-13 LAB — URINE CULTURE

## 2024-03-13 LAB — SPECIMEN STATUS REPORT

## 2024-03-14 ENCOUNTER — Telehealth: Payer: Self-pay | Admitting: Internal Medicine

## 2024-03-14 NOTE — Telephone Encounter (Signed)
 [name]  There is an echo from 2023 that shows moderate pulmonary hypertenson that is reportred as new wutg severe RA dilation. There is an exclusion for "uncontrolled pulmonary hypertension:" . It was then considered NEW. Right now we do not know what the status of that is without a repeat echo etc., or Dr Bertha Broad re-eval  We will screen fail her for the TVardi study on this basis - avoid undue risk  Also fyi - screening urine anlaysis showed abnormal UA and PCP did culture and showed Klebsiella UTI. This is now addressed   Thank you   MR  xxx       Latest Ref Rng & Units 04/08/2023    2:09 PM 09/04/2022    9:03 AM 02/26/2022   12:39 PM  PFT Results  FVC-Pre L 1.61  1.55  1.69   FVC-Predicted Pre % 64  60  64   FVC-Post L   1.60   FVC-Predicted Post %   60   Pre FEV1/FVC % % 90  87  85   Post FEV1/FCV % %   90   FEV1-Pre L 1.45  1.35  1.44   FEV1-Predicted Pre % 78  71  74   FEV1-Post L   1.44   DLCO uncorrected ml/min/mmHg 13.09  10.50  11.18   DLCO UNC% % 68  54  57   DLCO corrected ml/min/mmHg 13.09  10.50    DLCO COR %Predicted % 68  54    DLVA Predicted % 130  101  102   TLC L   2.89   TLC % Predicted %   54   RV % Predicted %   50     FVC on 4/4//25 was 1.71 L very similar to her baseline a year ago.

## 2024-03-16 NOTE — Telephone Encounter (Signed)
 Well, and appreciate the support.  Patient doing research as a care option and supporting pulmonary fibrosis center

## 2024-03-29 ENCOUNTER — Encounter: Payer: Self-pay | Admitting: Physician Assistant

## 2024-03-29 ENCOUNTER — Ambulatory Visit (INDEPENDENT_AMBULATORY_CARE_PROVIDER_SITE_OTHER): Admitting: Physician Assistant

## 2024-03-29 VITALS — BP 145/63 | HR 71 | Ht 65.5 in | Wt 192.0 lb

## 2024-03-29 DIAGNOSIS — N950 Postmenopausal bleeding: Secondary | ICD-10-CM | POA: Insufficient documentation

## 2024-03-29 DIAGNOSIS — R32 Unspecified urinary incontinence: Secondary | ICD-10-CM | POA: Insufficient documentation

## 2024-03-29 LAB — POCT URINALYSIS DIP (CLINITEK)
Bilirubin, UA: NEGATIVE
Glucose, UA: NEGATIVE mg/dL
Ketones, POC UA: NEGATIVE mg/dL
Nitrite, UA: NEGATIVE
POC PROTEIN,UA: NEGATIVE
Spec Grav, UA: 1.01 (ref 1.010–1.025)
Urobilinogen, UA: 0.2 U/dL
pH, UA: 7.5 (ref 5.0–8.0)

## 2024-03-29 NOTE — Assessment & Plan Note (Addendum)
 Patient presents today with vaginal bleeding x 1 week. Patient with reported total hysterectomy in 1971. Substantial blood in vaginal vault today on speculum exam, no obvious source of bleeding. No masses or ulcerations noted on exam. Urine dipstick positive for blood today, likely related to vaginal bleeding. Urgent referral placed to gynecology for further investigation. Warning signs reviewed, patient to follow up for new or worsening symptoms.

## 2024-03-29 NOTE — Progress Notes (Signed)
 Established Patient Office Visit  Subjective   Patient ID: [name], female    DOB: 1936/05/14  Age: 88 y.o. MRN: [id number]  Chief Complaint  Patient presents with   follow up Uti    Patient presents today for follow up regarding recent UTI. She denies persist UTI symptoms such as dysuria, frequency, or urgency. She reports 1 week of vaginal spotting, pelvic pressure, and urinary incontinence with sneezing or coughing. She denies significant pain. She denies sexual intercourse. She reports total hysterectomy and oophorectomy in 1971.      Review of Systems  Constitutional:  Negative for chills, fever and weight loss.  Gastrointestinal:  Negative for abdominal pain.  Genitourinary:  Negative for dysuria, flank pain, frequency, hematuria and urgency.      Objective:     BP (!) 145/63   Pulse 71   Ht 5' 5.5" (1.664 m)   Wt 192 lb (87.1 kg)   SpO2 96%   BMI 31.46 kg/m    Physical Exam Constitutional:      Appearance: Normal appearance.  HENT:     Head: Normocephalic and atraumatic.  Cardiovascular:     Rate and Rhythm: Normal rate and regular rhythm.     Heart sounds: Normal heart sounds. No murmur heard. Pulmonary:     Effort: Pulmonary effort is normal.     Breath sounds: Normal breath sounds.  Genitourinary:    Urethra: No prolapse or urethral swelling.     Vagina: No signs of injury. Bleeding present. No erythema, tenderness, lesions or prolapsed vaginal walls.     Comments: Cervix surgically absent. Substantial blood in vaginal vault on exam. Blood pooling in speculum upon exam. No tenderness.  Skin:    General: Skin is warm and dry.     Capillary Refill: Capillary refill takes less than 2 seconds.  Neurological:     General: No focal deficit present.     Mental Status: She is alert and oriented to person, place, and time.  Psychiatric:        Mood and Affect: Mood normal.        Behavior: Behavior normal.     Results for orders placed or  performed in visit on 03/29/24  POCT URINALYSIS DIP (CLINITEK)  Result Value Ref Range   Color, UA yellow yellow   Clarity, UA clear clear   Glucose, UA negative negative mg/dL   Bilirubin, UA negative negative   Ketones, POC UA negative negative mg/dL   Spec Grav, UA 0.865 7.846 - 1.025   Blood, UA small (A) negative   pH, UA 7.5 5.0 - 8.0   POC PROTEIN,UA negative negative, trace   Urobilinogen, UA 0.2 0.2 or 1.0 E.U./dL   Nitrite, UA Negative Negative   Leukocytes, UA Small (1+) (A) Negative    The ASCVD Risk score (Arnett DK, et al., 2019) failed to calculate for the following reasons:   The 2019 ASCVD risk score is only valid for ages 64 to 11    Assessment & Plan:   No follow-ups on file.   Postmenopausal vaginal bleeding Assessment & Plan: Patient presents today with vaginal bleeding x 1 week. Patient with reported total hysterectomy in 1971. Substantial blood in vaginal vault today on speculum exam, no obvious source of bleeding. No masses or ulcerations noted on exam. Urine dipstick positive for blood today, likely related to vaginal bleeding. Urgent referral placed to gynecology for further investigation. Warning signs reviewed, patient to follow up for new or worsening  symptoms.   Orders: -     Ambulatory referral to Obstetrics / Gynecology  Urinary incontinence, unspecified type Assessment & Plan: Patient presents today with urinary incontinence with sneezing or coughing, likely stress incontinence. Referral to urogynecology for further workup and management.   Orders: -     POCT URINALYSIS DIP (CLINITEK) -     Ambulatory referral to Urogynecology    [employer], PA-C

## 2024-03-29 NOTE — Assessment & Plan Note (Signed)
 Patient presents today with urinary incontinence with sneezing or coughing, likely stress incontinence. Referral to urogynecology for further workup and management.

## 2024-03-30 ENCOUNTER — Other Ambulatory Visit: Payer: Self-pay | Admitting: Cardiovascular Disease

## 2024-03-30 NOTE — Telephone Encounter (Signed)
 Prescription refill request for Eliquis  received.  Indication: afib  Last office visit: 07/14/2023, [name] Scr: 1.07, 12/09/2023 Age: 88 yo  Weight: 87.1 kg   Refill sent.

## 2024-04-03 ENCOUNTER — Other Ambulatory Visit: Payer: Self-pay | Admitting: Family Medicine

## 2024-04-04 ENCOUNTER — Encounter: Payer: Self-pay | Admitting: Emergency Medicine

## 2024-04-04 ENCOUNTER — Ambulatory Visit: Attending: Emergency Medicine | Admitting: Emergency Medicine

## 2024-04-04 VITALS — BP 138/72 | HR 77 | Ht 65.5 in | Wt 191.0 lb

## 2024-04-04 DIAGNOSIS — I5032 Chronic diastolic (congestive) heart failure: Secondary | ICD-10-CM | POA: Diagnosis not present

## 2024-04-04 DIAGNOSIS — I4821 Permanent atrial fibrillation: Secondary | ICD-10-CM | POA: Diagnosis not present

## 2024-04-04 DIAGNOSIS — I272 Pulmonary hypertension, unspecified: Secondary | ICD-10-CM | POA: Diagnosis not present

## 2024-04-04 DIAGNOSIS — E039 Hypothyroidism, unspecified: Secondary | ICD-10-CM | POA: Diagnosis not present

## 2024-04-04 DIAGNOSIS — J84112 Idiopathic pulmonary fibrosis: Secondary | ICD-10-CM

## 2024-04-04 DIAGNOSIS — I10 Essential (primary) hypertension: Secondary | ICD-10-CM

## 2024-04-04 NOTE — Patient Instructions (Addendum)
 Medication Instructions:  NO CHANGES   Lab Work: NONE   Testing/Procedures: Your physician has requested that you have an ECHOCARDIOGRAM. Echocardiography is a painless test that uses sound waves to create images of your heart. It provides your doctor with information about the size and shape of your heart and how well your heart's chambers and valves are working. This procedure takes approximately one hour. There are no restrictions for this procedure. Please do NOT wear cologne, perfume, aftershave, or lotions (deodorant is allowed). Please arrive 15 minutes prior to your appointment time.  Please note: We ask at that you not bring children with you during ultrasound (echo/ vascular) testing. Due to room size and safety concerns, children are not allowed in the ultrasound rooms during exams. Our front office staff cannot provide observation of children in our lobby area while testing is being conducted. An adult accompanying a patient to their appointment will only be allowed in the ultrasound room at the discretion of the ultrasound technician under special circumstances. We apologize for any inconvenience.   Follow-Up: At [facility], you and your health needs are our priority.  As part of our continuing mission to provide you with exceptional heart care, our providers are all part of one team.  This team includes your primary Cardiologist (physician) and Advanced Practice Providers or APPs (Physician Assistants and Nurse Practitioners) who all work together to provide you with the care you need, when you need it.  Your next appointment:   3 MONTHS  Provider:   Luana Rumple, MD

## 2024-04-04 NOTE — Progress Notes (Addendum)
 Cardiology Office Note:    Date:  04/04/2024  ID:  [name], DOB 1936/01/09, MRN [id number] PCP: [name], MD  [facility] Providers Cardiologist:  Luana Rumple, MD       Patient Profile:      Chief Complaint: Follow-up for permanent atrial fibrillation History of Present Illness:  [name] is a 88 y.o. female with visit-pertinent history of permanent atrial fibrillation, CHF, hypertension, hypothyroidism, pulmonary fibrosis, hypertension, hiatal hernia, osteopenia  She established care with cardiology service on 02/18/2022 with Dr. Alvis Ba for new onset atrial fibrillation.  Echocardiogram 01/2022 showed LVEF 55 to 60%, no RWMA, mild LVH, RV function and size normal, moderately elevated PASP, biatrial size severely dilated, mild MR, moderate TR, trivial AR.  Lexiscan  Myoview  02/25/2022 was normal low risk study.  She underwent elective cardioversion for atrial fibrillation on 03/10/2022 but had early recurrence of her arrhythmia.  She was never aware of her palpitations.  She is on anticoagulation with Eliquis  and has not had any bleeding concerns.  She is well rate controlled on low-dose beta-blocker.  She was last seen in the office on 07/14/2023.  She remained largely unaware of her arrhythmia.  Has NYHA class II exertional dyspnea without orthopnea, PND, lower extremity edema or chest pain.  She is doing well, current medical management was continued.   Discussed the use of AI scribe software for clinical note transcription with the patient, who gave verbal consent to proceed.  History of Present Illness [name] is an 88 year old female with pulmonary fibrosis, atrial fibrillation, and moderate pulmonary hypertension who presents with ongoing shortness of breath.  She is without any acute cardiovascular concerns today.  She lives by herself and she remains somewhat active for her age and past medical history.  She tells me that she has made this  appointment today as she is trying to get in a medication trial for her pulmonary fibrosis.  However she was told there is exclusion for "uncontrolled pulmonary hypertension" and she needs an reevaluation.  She is eager for this trial as she has been on Ofev  without improvement in her chronic shortness of breath.    Her shortness of breath has worsened over the past year, affecting her ability to perform daily activities without limitations.  She is still able to complete her daily activities however does need to take multiple breaks due to shortness of breath.  She uses an inhaler multiple times daily. She has atrial fibrillation, and denies any cardiac awareness of arrhythmia.  She denies any tachycardia or fast heart rates.  She denies any lower extremity swelling, orthopnea, PND.  She is without any chest pains or exertional angina.  Review of systems:  Please see the history of present illness. All other systems are reviewed and otherwise negative.     Home Medications:    Current Meds  Medication Sig   acetaminophen (TYLENOL) 650 MG CR tablet Take 650 mg by mouth every 8 (eight) hours as needed for pain.   albuterol  (VENTOLIN  HFA) 108 (90 Base) MCG/ACT inhaler Inhale 2 puffs into the lungs every 6 (six) hours as needed for wheezing or shortness of breath.   alendronate  (FOSAMAX ) 70 MG tablet TAKE 1 TABLET(70 MG) BY MOUTH EVERY 7 DAYS WITH A FULL GLASS OF WATER  AND ON AN EMPTY STOMACH   ALPRAZolam  (XANAX ) 0.5 MG tablet TAKE 1 TABLET BY MOUTH EVERY NIGHT AT BEDTIME. MAY TAKE 1/2 TABLET DURING THE DAY AS NEEDED  Cholecalciferol (VITAMIN D3) 2000 UNITS TABS Take 4,000 Units by mouth daily.   ELIQUIS  5 MG TABS tablet TAKE 1 TABLET(5 MG) BY MOUTH TWICE DAILY. FOLLOW-UP   fluconazole  (DIFLUCAN ) 150 MG tablet Take 1 now and and take 1 in 5 days   indapamide  (LOZOL ) 1.25 MG tablet Take 1 tablet (1.25 mg total) by mouth every morning.   levothyroxine  (SYNTHROID ) 125 MCG tablet TAKE 1 TABLET BY MOUTH  DAILY   metoprolol  succinate (TOPROL -XL) 25 MG 24 hr tablet TAKE 1 TABLET BY MOUTH DAILY   metroNIDAZOLE  (FLAGYL ) 500 MG tablet Take 1 tablet (500 mg total) by mouth 2 (two) times daily.   Multiple Vitamin (MULTI-VITAMIN DAILY PO) Take 1 tablet by mouth daily.   Nintedanib (OFEV ) 150 MG CAPS Take 1 capsule (150 mg total) by mouth 2 (two) times daily.   pantoprazole  (PROTONIX ) 40 MG tablet TAKE 1 TABLET(40 MG) BY MOUTH DAILY   potassium chloride  (KLOR-CON  M) 10 MEQ tablet TAKE 1 TABLET(10 MEQ) BY MOUTH DAILY   sertraline  (ZOLOFT ) 100 MG tablet TAKE 1 TABLET BY MOUTH EVERY DAY   Studies Reviewed:   EKG Interpretation Date/Time:  Tuesday Apr 04 2024 09:08:18 EDT Ventricular Rate:  68 PR Interval:    QRS Duration:  88 QT Interval:  420 QTC Calculation: 446 R Axis:   11  Text Interpretation: Atrial fibrillation Low voltage QRS ST & T wave abnormality, consider anterior ischemia When compared with ECG of 14-Jul-2023 09:52, No significant change was found Confirmed by Palmer Bobo [phone]) on 04/04/2024 9:17:51 AM    Lexiscan  Myoview  02/25/2022   The study is normal. The study is low risk.   No ST deviation was noted.   LV perfusion is normal.   Prior study not available for comparison.   Normal perfusion. Study was not gated due to atrial fibrillation. This is a low risk study. No prior for comparison.  Echocardiogram 02/10/2022 1. Left ventricular ejection fraction, by estimation, is 55 to 60%. The  left ventricle has normal function. The left ventricle has no regional  wall motion abnormalities. There is mild concentric left ventricular  hypertrophy. Diastolic function  indeterminant due to Afib.   2. Right ventricular systolic function is normal. The right ventricular  size is normal. There is moderately elevated pulmonary artery systolic  pressure. The estimated right ventricular systolic pressure is 46.7 mmHg.   3. Left atrial size was moderately-to-severely dilated.   4. Right  atrial size was severely dilated.   5. The mitral valve is abnormal. Mild mitral valve regurgitation.  Moderate mitral annular calcification.   6. Tricuspid valve regurgitation is moderate.   7. The aortic valve is tricuspid. There is mild calcification of the  aortic valve. There is mild thickening of the aortic valve. Aortic valve  regurgitation is trivial.   8. The inferior vena cava is dilated in size with >50% respiratory  variability, suggesting right atrial pressure of 8 mmHg.  Risk Assessment/Calculations:    CHA2DS2-VASc Score = 5   This indicates a 7.2% annual risk of stroke. The patient's score is based upon: CHF History: 1 HTN History: 1 Diabetes History: 0 Stroke History: 0 Vascular Disease History: 0 Age Score: 2 Gender Score: 1            Physical Exam:   VS:  BP 138/72 (BP Location: Left Arm, Patient Position: Sitting, Cuff Size: Normal)   Pulse 77   Ht 5' 5.5" (1.664 m)   Wt 191 lb (86.6 kg)  SpO2 95%   BMI 31.30 kg/m    Wt Readings from Last 3 Encounters:  04/04/24 191 lb (86.6 kg)  03/29/24 192 lb (87.1 kg)  03/08/24 194 lb (88 kg)    GEN: Well nourished, well developed in no acute distress NECK: No JVD; No carotid bruits CARDIAC: Irregularly irregular rhythm, no murmurs, rubs, gallops RESPIRATORY:  Clear to auscultation without rales, wheezing or rhonchi  ABDOMEN: Soft, non-tender, non-distended EXTREMITIES:  No edema; No acute deformity     Assessment and Plan:  Permanent atrial fibrillation H/o severe biatrial dilation with evidence of early recurrence after cardioversion.  Now being managed as permanent EKG today shows atrial fibrillation with ventricular rate of 68 bpm She remains overall asymptomatic and cardiac unaware of arrhythmia - Today she remains stable and rate controlled on low-dose beta-blocker - Continue Eliquis  5 mg twice daily, no bleeding concerns - Continue metoprolol  XL 25 mg daily  Chronic diastolic heart  failure Echocardiogram 01/2022 with LVEF 55 to 60% Noted to be a minor contributor to her exertional dyspnea in the past. It is hard to evaluate for diastolic dysfunction due to her permanent atrial fibrillation. - Today she is euvolemic and well compensated on exam - Currently on Lozol  1.25 mg daily - Continue with daily weights, sodium striction, fluid restriction, and daily exercise  Hypertension Blood pressure today is 138/72 and under adequate control - Continue Lozol  1.25 mg daily and metoprolol  XL 25 mg daily - Continue with home BP monitoring  Hypothyroidism TSH 4.9, free T41.47 10/2023 - Well-controlled on levothyroxine  125 mcg and managed by primary care  Idiopathic pulmonary fibrosis / Shortness of breath - Managed by pulmonology on Ofev   Pulmonary hypertension Patient presented today as she would like to begin a drug trial for her idiopathic pulmonary fibrosis.  However she was told there was exclusion for "uncontrolled pulmonary hypertension" Her echocardiogram on 01/2022 showed a PASP of 46.7 which indicates moderate pulmonary hypertension. - Will update echocardiogram today to get updated reading on patient's PASP due to being a candidate for new drug trial for her pulmonary fibrosis.  Her PASP is likely elevated in the setting of pulmonary fibrosis with scarring and stiffening of the lung tissue causing impaired gas exchange.  Most recent echocardiogram did show that her right ventricular systolic function and size were normal.  With stable RV function and improved PASP on repeat echocardiogram she is stable from a cardiology standpoint.      Dispo:  Return in about 3 months (around 07/05/2024).  Signed, Ava Boatman, NP

## 2024-04-05 ENCOUNTER — Other Ambulatory Visit: Payer: Self-pay

## 2024-04-05 ENCOUNTER — Other Ambulatory Visit (HOSPITAL_COMMUNITY)
Admission: RE | Admit: 2024-04-05 | Discharge: 2024-04-05 | Disposition: A | Discharge status: Home / Self Care | Source: Ambulatory Visit | Attending: Obstetrics & Gynecology | Admitting: Obstetrics & Gynecology

## 2024-04-05 ENCOUNTER — Ambulatory Visit: Admitting: Obstetrics & Gynecology

## 2024-04-05 ENCOUNTER — Encounter: Payer: Self-pay | Admitting: Obstetrics & Gynecology

## 2024-04-05 VITALS — BP 150/90 | HR 74 | Ht 65.5 in | Wt 193.0 lb

## 2024-04-05 DIAGNOSIS — N950 Postmenopausal bleeding: Secondary | ICD-10-CM | POA: Insufficient documentation

## 2024-04-05 DIAGNOSIS — N362 Urethral caruncle: Secondary | ICD-10-CM | POA: Diagnosis not present

## 2024-04-05 DIAGNOSIS — C52 Malignant neoplasm of vagina: Secondary | ICD-10-CM

## 2024-04-05 NOTE — Progress Notes (Signed)
 Chief Complaint  Patient presents with   Vaginal Bleeding      88 y.o. G1P1001 No LMP recorded. Patient has had a hysterectomy. The current method of family planning is status post hysterectomy.  Outpatient Encounter Medications as of 04/05/2024  Medication Sig   acetaminophen (TYLENOL) 650 MG CR tablet Take 650 mg by mouth every 8 (eight) hours as needed for pain.   albuterol  (VENTOLIN  HFA) 108 (90 Base) MCG/ACT inhaler Inhale 2 puffs into the lungs every 6 (six) hours as needed for wheezing or shortness of breath.   alendronate  (FOSAMAX ) 70 MG tablet TAKE 1 TABLET(70 MG) BY MOUTH EVERY 7 DAYS WITH A FULL GLASS OF WATER  AND ON AN EMPTY STOMACH   ALPRAZolam  (XANAX ) 0.5 MG tablet TAKE 1 TABLET BY MOUTH EVERY NIGHT AT BEDTIME. MAY TAKE 1/2 TABLET DURING THE DAY AS NEEDED   Cholecalciferol (VITAMIN D3) 2000 UNITS TABS Take 4,000 Units by mouth daily.   ELIQUIS  5 MG TABS tablet TAKE 1 TABLET(5 MG) BY MOUTH TWICE DAILY. FOLLOW-UP   indapamide  (LOZOL ) 1.25 MG tablet Take 1 tablet (1.25 mg total) by mouth every morning.   levothyroxine  (SYNTHROID ) 125 MCG tablet TAKE 1 TABLET BY MOUTH DAILY   metoprolol  succinate (TOPROL -XL) 25 MG 24 hr tablet TAKE 1 TABLET BY MOUTH DAILY   Multiple Vitamin (MULTI-VITAMIN DAILY PO) Take 1 tablet by mouth daily.   Nintedanib (OFEV ) 150 MG CAPS Take 1 capsule (150 mg total) by mouth 2 (two) times daily.   pantoprazole  (PROTONIX ) 40 MG tablet TAKE 1 TABLET(40 MG) BY MOUTH DAILY   potassium chloride  (KLOR-CON  M) 10 MEQ tablet TAKE 1 TABLET(10 MEQ) BY MOUTH DAILY   sertraline  (ZOLOFT ) 100 MG tablet TAKE 1 TABLET BY MOUTH EVERY DAY   [DISCONTINUED] fluconazole  (DIFLUCAN ) 150 MG tablet Take 1 now and and take 1 in 5 days   [DISCONTINUED] metroNIDAZOLE  (FLAGYL ) 500 MG tablet Take 1 tablet (500 mg total) by mouth 2 (two) times daily.   No facility-administered encounter medications on file as of 04/05/2024.    Subjective Referred from [facility]  for vaginal bleeding First experienced 3 weeks ago Hysterectomy 1971 Seen by Marco Severs PA-C and found to have significant amount blood in vault  She is on eliquis  BID   Past Medical History:  Diagnosis Date   Chronic Leukemia    Depression    H/O hiatal hernia    Hernia    Hernia    right inguinal hernia   Hypertension    Hypothyroidism    Leg swelling    Osteitis pubis (HCC) 05/30/2001   Osteopenia 03/30/2009   Osteopenia 07/30/2022   Bone density 2021 showed elevated FRAX patient was started on Fosamax    Thyroid  disease     Past Surgical History:  Procedure Laterality Date   ABDOMINAL HYSTERECTOMY  03/1970   APPENDECTOMY  03/1966   BILATERAL SALPINGOOPHORECTOMY     CARDIOVERSION N/A 03/10/2022   Procedure: CARDIOVERSION;  Surgeon: Luana Rumple, MD;  Location: [facility];  Service: Cardiovascular;  Laterality: N/A;   CATARACT EXTRACTION W/PHACO Right 09/25/2014   Procedure: CATARACT EXTRACTION PHACO AND INTRAOCULAR LENS PLACEMENT (IOC);  Surgeon: Lavonia Powers T. Gennie Kicks, MD;  Location: [facility];  Service: Ophthalmology;  Laterality: Right;  CDE:7.56   CATARACT EXTRACTION W/PHACO Left 10/09/2014   Procedure: CATARACT EXTRACTION PHACO AND INTRAOCULAR LENS PLACEMENT LEFT EYE CDE=6.51;  Surgeon: Lavonia Powers T. Gennie Kicks, MD;  Location: [facility];  Service: Ophthalmology;  Laterality: Left;   CHOLECYSTECTOMY  1980   COLONOSCOPY  1999   COLONOSCOPY  March 2007   Repeat 10 years   COLONOSCOPY  11/06/2003    Internal and external hemorrhoids/Two tiny rectal distal sigmoid polyps cold biopsied/Otherwise within normal limits to the cecum   COLONOSCOPY N/A 08/29/2014   Dr. Riley Cheadle: Sessile polyp was found at the cecum status post cold snare polypectomy, tubular adenoma. No further surveillance unless clinical status changes   ESOPHAGOGASTRODUODENOSCOPY  2007   Dr. Riley Cheadle: normal esophagus, small tom oderate sized hiatal hernia, Cameron lesions   SKIN CANCER EXCISION Left 11 1 2016   left arm  squamous cancer   TUMOR REMOVAL  03/1968   ovarian    OB History     Gravida  1   Para  1   Term  1   Preterm      AB      Living  1      SAB      IAB      Ectopic      Multiple      Live Births  1           Allergies  Allergen Reactions   Erythromycin Shortness Of Breath   Aspirin Other (See Comments)    Caused internal bleeding. Patient had ulcer at the time. Instructed by PCP - Candance Certain not to take any longer.   Ibuprofen     Instructed not to take by PCP - Candance Certain due to reaction to aspirin as well (precautionary).    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   Widowed since 12-Jul-2021. Husband W.E. Dejager, died from Kidney Cancer.   Son-Tommy lives nearby.   2 grandson's deceased, Clint and Ace Holder. Ace Holder passed 02/2022 from Brain Cancer.    Social Drivers of Corporate investment banker Strain: Low Risk  (07/02/2023)   Overall Financial Resource Strain (CARDIA)    Difficulty of Paying Living Expenses: Not hard at all  Food Insecurity: No Food Insecurity (07/02/2023)   Hunger Vital Sign    Worried About Running Out of Food in the Last Year: Never true    Ran Out of Food in the Last Year: Never true  Transportation Needs: No Transportation Needs (07/02/2023)   PRAPARE -  Administrator, Civil Service (Medical): No    Lack of Transportation (Non-Medical): No  Physical Activity: Insufficiently Active (07/02/2023)   Exercise Vital Sign    Days of Exercise per Week: 7 days    Minutes of Exercise per Session: 20 min  Stress: No Stress Concern Present (07/02/2023)   [employer] of Occupational Health - Occupational Stress Questionnaire    Feeling  of Stress : Not at all  Social Connections: Moderately Integrated (07/02/2023)   Social Connection and Isolation Panel [NHANES]    Frequency of Communication with Friends and Family: More than three times a week    Frequency of Social Gatherings with Friends and Family: More than three times a week    Attends Religious Services: More than 4 times per year    Active Member of [employer] or Organizations: Yes    Attends Banker Meetings: More than 4 times per year    Marital Status: Widowed    Family History  Problem Relation Age of Onset   Dementia Mother    Heart disease Father    Cancer Father        throat   Cancer Sister        breast   Colon cancer Neg Hx    Lung disease Neg Hx    Autoimmune disease Neg Hx     Medications:       Current Outpatient Medications:    acetaminophen (TYLENOL) 650 MG CR tablet, Take 650 mg by mouth every 8 (eight) hours as needed for pain., Disp: , Rfl:    albuterol  (VENTOLIN  HFA) 108 (90 Base) MCG/ACT inhaler, Inhale 2 puffs into the lungs every 6 (six) hours as needed for wheezing or shortness of breath., Disp: 18 g, Rfl: 1   alendronate  (FOSAMAX ) 70 MG tablet, TAKE 1 TABLET(70 MG) BY MOUTH EVERY 7 DAYS WITH A FULL GLASS OF WATER  AND ON AN EMPTY STOMACH, Disp: 4 tablet, Rfl: 3   ALPRAZolam  (XANAX ) 0.5 MG tablet, TAKE 1 TABLET BY MOUTH EVERY NIGHT AT BEDTIME. MAY TAKE 1/2 TABLET DURING THE DAY AS NEEDED, Disp: 45 tablet, Rfl: 4   Cholecalciferol (VITAMIN D3) 2000 UNITS TABS, Take 4,000 Units by mouth daily., Disp: , Rfl:    ELIQUIS  5 MG TABS tablet, TAKE 1  TABLET(5 MG) BY MOUTH TWICE DAILY. FOLLOW-UP, Disp: 180 tablet, Rfl: 1   indapamide  (LOZOL ) 1.25 MG tablet, Take 1 tablet (1.25 mg total) by mouth every morning., Disp: 90 tablet, Rfl: 0   levothyroxine  (SYNTHROID ) 125 MCG tablet, TAKE 1 TABLET BY MOUTH DAILY, Disp: 90 tablet, Rfl: 1   metoprolol  succinate (TOPROL -XL) 25 MG 24 hr tablet, TAKE 1 TABLET BY MOUTH DAILY, Disp: 90 tablet, Rfl: 1   Multiple Vitamin (MULTI-VITAMIN DAILY PO), Take 1 tablet by mouth daily., Disp: , Rfl:    Nintedanib (OFEV ) 150 MG CAPS, Take 1 capsule (150 mg total) by mouth 2 (two) times daily., Disp: 180 capsule, Rfl: 1   pantoprazole  (PROTONIX ) 40 MG tablet, TAKE 1 TABLET(40 MG) BY MOUTH DAILY, Disp: 90 tablet, Rfl: 1   potassium chloride  (KLOR-CON  M) 10 MEQ tablet, TAKE 1 TABLET(10 MEQ) BY MOUTH DAILY, Disp: 90 tablet, Rfl: 1   sertraline  (ZOLOFT ) 100 MG tablet, TAKE 1 TABLET BY MOUTH EVERY DAY, Disp: 90 tablet, Rfl: 1  Objective Blood pressure (!) 150/90, pulse 74, height 5' 5.5" (1.664 m), weight 193 lb (87.5 kg).  Lesion noted at the top of the vaginal cuff involving mostly anterior portion of cuff Biopsied and sent to path [facility] used for hemostasis  Pertinent ROS No burning with urination, frequency or urgency No nausea, vomiting or diarrhea Nor fever chills or other constitutional symptoms   Labs or studies reviewed    Impression + Management Plan: Diagnoses this Encounter::   ICD-10-CM   1. Vaginal cancer, suspected, pending biopsy result  C52     2. Postmenopausal vaginal bleeding  N95.0 Surgical pathology    3. Urethral caruncle: small, minor  N36.2         Medications prescribed during  this encounter: No orders of the defined types were placed in this encounter.   Labs or Scans Ordered during this encounter: No orders of the defined types were placed in this encounter.     Follow up No follow-ups on file.

## 2024-04-07 ENCOUNTER — Encounter: Payer: Self-pay | Admitting: Obstetrics & Gynecology

## 2024-04-07 ENCOUNTER — Other Ambulatory Visit: Payer: Self-pay | Admitting: Obstetrics & Gynecology

## 2024-04-07 DIAGNOSIS — C52 Malignant neoplasm of vagina: Secondary | ICD-10-CM

## 2024-04-11 ENCOUNTER — Telehealth: Payer: Self-pay | Admitting: *Deleted

## 2024-04-11 ENCOUNTER — Encounter: Payer: Self-pay | Admitting: Obstetrics & Gynecology

## 2024-04-11 ENCOUNTER — Ambulatory Visit: Admitting: Obstetrics & Gynecology

## 2024-04-11 DIAGNOSIS — C52 Malignant neoplasm of vagina: Secondary | ICD-10-CM | POA: Diagnosis not present

## 2024-04-11 NOTE — Telephone Encounter (Signed)
 Spoke with the patient regarding the referral to GYN oncology. Patient scheduled as new patient with Dr Orvil Bland on 5/30 at 10:30 am. Patient given an arrival time of 10 am.   Explained to the patient the the doctor will perform a pelvic exam at this visit. Patient given the policy that only one visitor allowed and that visitor must be over 16 yrs are allowed in the Cancer Center. Patient given the address/phone number for the clinic and that the center offers free valet service. Patient aware that masks required.

## 2024-04-11 NOTE — Progress Notes (Signed)
 Telephone follow up for results: Patient is at home I am in my office Total time 10 minutes  Follow up appointment for results: Vaginal biopsy  Chief Complaint  Patient presents with   discuss results    There were no vitals taken for this visit.  Vaginal biopsy reveals squamous cell carcinoma, focally keratinizing, Grade 2-3  MEDS ordered this encounter: No orders of the defined types were placed in this encounter.   Orders for this encounter: No orders of the defined types were placed in this encounter.   Impression + Management Plan   ICD-10-CM   1. Vaginal cancer: squamous cell carcinoma, focally keratinizing, Grade 2-3  C52    Discussed with Dr Orvil Bland, [facility] onc[facility].  MRI and PET scan both set up for patient as well as her post scans appt with Dr Orvil Bland      Follow Up: Return if symptoms worsen or fail to improve, for see referrals and scan appts.     All questions were answered.  Past Medical History:  Diagnosis Date   Chronic Leukemia    Depression    H/O hiatal hernia    Hernia    Hernia    right inguinal hernia   Hypertension    Hypothyroidism    Leg swelling    Osteitis pubis (HCC) 05/30/2001   Osteopenia 03/30/2009   Osteopenia 07/30/2022   Bone density 2021 showed elevated FRAX patient was started on Fosamax    Thyroid  disease     Past Surgical History:  Procedure Laterality Date   ABDOMINAL HYSTERECTOMY  03/1970   APPENDECTOMY  03/1966   BILATERAL SALPINGOOPHORECTOMY     CARDIOVERSION N/A 03/10/2022   Procedure: CARDIOVERSION;  Surgeon: Luana Rumple, MD;  Location: [facility];  Service: Cardiovascular;  Laterality: N/A;   CATARACT EXTRACTION W/PHACO Right 09/25/2014   Procedure: CATARACT EXTRACTION PHACO AND INTRAOCULAR LENS PLACEMENT (IOC);  Surgeon: Lavonia Powers T. Gennie Kicks, MD;  Location: [facility];  Service: Ophthalmology;  Laterality: Right;  CDE:7.56   CATARACT EXTRACTION W/PHACO Left 10/09/2014   Procedure: CATARACT EXTRACTION PHACO AND  INTRAOCULAR LENS PLACEMENT LEFT EYE CDE=6.51;  Surgeon: Lavonia Powers T. Gennie Kicks, MD;  Location: [facility];  Service: Ophthalmology;  Laterality: Left;   CHOLECYSTECTOMY  1980   COLONOSCOPY  1999   COLONOSCOPY  March 2007   Repeat 10 years   COLONOSCOPY  11/06/2003    Internal and external hemorrhoids/Two tiny rectal distal sigmoid polyps cold biopsied/Otherwise within normal limits to the cecum   COLONOSCOPY N/A 08/29/2014   Dr. Riley Cheadle: Sessile polyp was found at the cecum status post cold snare polypectomy, tubular adenoma. No further surveillance unless clinical status changes   ESOPHAGOGASTRODUODENOSCOPY  2007   Dr. Riley Cheadle: normal esophagus, small tom oderate sized hiatal hernia, Cameron lesions   SKIN CANCER EXCISION Left 11 1 2016   left arm squamous cancer   TUMOR REMOVAL  03/1968   ovarian    OB History     Gravida  1   Para  1   Term  1   Preterm      AB      Living  1      SAB      IAB      Ectopic      Multiple      Live Births  1           Allergies  Allergen Reactions   Erythromycin Shortness Of Breath   Aspirin Other (See Comments)    Caused internal bleeding.  Patient had ulcer at the time. Instructed by PCP - Candance Certain not to take any longer.   Ibuprofen     Instructed not to take by PCP - Candance Certain due to reaction to aspirin as well (precautionary).    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   Widowed since 22-Jul-2021. Husband W.E. Croston, died from Kidney Cancer.   Son-Tommy lives nearby.   2 grandson's deceased, Clint and Ace Holder. Ace Holder passed 02/2022 from Brain Cancer.    Social Drivers of Corporate investment banker Strain: Low Risk  (04/05/2024)   Overall Financial Resource Strain (CARDIA)    Difficulty of Paying Living Expenses: Not hard at all  Food Insecurity: No Food Insecurity (04/05/2024)   Hunger Vital Sign    Worried About Running Out of Food in the Last Year: Never true    Ran Out of Food in the Last Year: Never true  Transportation Needs: No Transportation Needs (04/05/2024)   PRAPARE - Administrator, Civil Service (Medical): No    Lack of Transportation (Non-Medical): No  Physical Activity: Insufficiently Active (04/05/2024)   Exercise Vital Sign    Days of Exercise per Week: 2 days    Minutes of Exercise per Session: 10 min  Stress: No Stress Concern Present (04/05/2024)   [employer] of Occupational Health - Occupational Stress Questionnaire    Feeling of Stress : Only a little  Social Connections: Moderately Integrated (04/05/2024)   Social Connection and Isolation Panel [NHANES]    Frequency of Communication with Friends and Family: More than three times a week    Frequency of Social Gatherings with Friends and Family: Twice a week    Attends Religious Services: More than 4 times per year    Active Member of [employer] or Organizations: Yes    Attends Banker Meetings: More than 4 times per year    Marital Status: Widowed    Family History  Problem Relation Age of Onset   Dementia Mother    Heart disease Father    Cancer Father        throat   Cancer Sister        breast   Colon cancer Neg Hx    Lung disease Neg Hx    Autoimmune disease Neg Hx

## 2024-04-20 ENCOUNTER — Ambulatory Visit (HOSPITAL_COMMUNITY)
Admission: RE | Admit: 2024-04-20 | Discharge: 2024-04-20 | Disposition: A | Discharge status: Home / Self Care | Source: Ambulatory Visit | Attending: Obstetrics & Gynecology | Admitting: Obstetrics & Gynecology

## 2024-04-20 DIAGNOSIS — C52 Malignant neoplasm of vagina: Secondary | ICD-10-CM | POA: Diagnosis not present

## 2024-04-20 DIAGNOSIS — N3289 Other specified disorders of bladder: Secondary | ICD-10-CM | POA: Diagnosis not present

## 2024-04-20 DIAGNOSIS — K4090 Unilateral inguinal hernia, without obstruction or gangrene, not specified as recurrent: Secondary | ICD-10-CM | POA: Insufficient documentation

## 2024-04-20 DIAGNOSIS — R590 Localized enlarged lymph nodes: Secondary | ICD-10-CM | POA: Diagnosis not present

## 2024-04-20 LAB — GLUCOSE, CAPILLARY: Glucose-Capillary: 105 mg/dL — ABNORMAL HIGH (ref 70–99)

## 2024-04-20 MED ORDER — GADOBUTROL 1 MMOL/ML IV SOLN
9.0000 mL | Freq: Once | INTRAVENOUS | Status: AC | PRN
  Administered 2024-04-20: 9 mL via INTRAVENOUS

## 2024-04-20 MED ORDER — FLUDEOXYGLUCOSE F - 18 (FDG) INJECTION
9.6000 | Freq: Once | INTRAVENOUS | Status: AC
  Administered 2024-04-20: 9.4 via INTRAVENOUS

## 2024-04-27 NOTE — H&P (View-Only) (Signed)
 GYNECOLOGIC ONCOLOGY NEW PATIENT CONSULTATION   Patient Name: [name]  Patient Age: 88 y.o. Date of Service: 04/28/24 Referring Provider: Dr. 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  We spent some time today reviewing with the patient and her granddaughter recent biopsy, showing squamous cell carcinoma.  This biopsy was taken from the vaginal apex.  The patient had a hysterectomy years ago for benign indications and is not completely sure whether her cervix was removed at that time.  It is quite possible that this represents a squamous cell carcinoma of the cervix if she had a supracervical hysterectomy versus primary vaginal cancer.  We also discussed her recent imaging including PET scan and MRI.  I reviewed images both studies with the patient and her family.  We discussed evidence of metastatic disease on the PET scan with hypermetabolic right pelvic lymph node and evidence of local invasion with parametrial invasion on MRI.  Additionally, on MRI, there is some loss of plane between the anterior vagina and bladder suggestive of possible infiltration.  I have asked that additional stain be added to the patient's biopsy including PD-L1, HER2, p16, p53, MMR.  We will also request that her tumor be sent for next generation sequencing.  In the setting of advanced stage vaginal cancer, we discussed that surgery would not be curative and would be quite morbid.  She would also require adjuvant treatment, increasing possible toxicity.  We discussed that standard of care is radiation.  Often, consideration is made for concurrent chemotherapy for chemosensitization as an extrapolation from our cervical cancer literature.  Given her underlying medical comorbidities, including neuropathy and hearing loss, I discussed with Dr. Lonn that  she would be a better candidate for carboplatin  rather than cisplatin for chemosensitization.  If her tumor is PD-L1 positive, I would suggest consideration of either concurrent immunotherapy with upfront treatment with maintenance immunotherapy or maintenance immunotherapy.  She voices on multiple occasions during her visit today difficulty with urination and feeling like she is retaining urine.  On exam, it is difficult to tell whether there is a fistula between the bladder and the vagina although I am not definitively able to see the urine in the vagina and I do not see evidence of this fistula on her imaging.  Given her reported retention, Foley catheter placement was discussed with her and ultimately performed.  Foley catheter teaching done prior to her discharge from clinic.    Given her daily bleeding, CBC was ordered.  Her white blood cell count was elevated, which is her baseline and expected in the setting of her history of B-cell lymphoma.  A copy of this note was sent to the patient's referring provider.   80 minutes of total time was spent for this patient encounter, including preparation, face-to-face counseling with the patient and coordination of care, and documentation of the encounter.  Sheena Dollar, MD  Division of Gynecologic Oncology  Department of Obstetrics and Gynecology  [facility]  ___________________________________________  Chief Complaint: Chief Complaint  Patient presents with   Vaginal cancer [facility])    History of Present Illness:  [name] is a 88 y.o. y.o. female who is seen in consultation at the request of Dr. Inch for an evaluation of vaginal biopsy showing squamous cell carcinoma.  04/05/2024: Patient was seen for postmenopausal bleeding  in the setting of prior hysterectomy.  Bleeding started 3 weeks previously. 04/05/24: Vaginal cuff biopsy shows invasive moderate to poorly differentiated squamous cell carcinoma, focally  keratinizing 04/07/24: PET scan shows hypermetabolic lesion along the upper third of the vaginal cuff posterior to the bladder compatible with malignancy.  8 mm right external iliac lymph node with an SUV of 6.6, compatible with metastatic disease.  Simple right mid to lower kidney cyst.  Right inguinal hernia with adipose tissue and loop of small bowel.  Hiatal hernia.  Moderate cardiomegaly. 04/07/24: Pelvic MRI shows concentric mass involving upper portion of the vaginal cuff with bilateral parametrial invasion.  Mild irregular bladder wall thickening involving bladder base, contiguous with the vaginal soft tissue mass, concerning for direct bladder invasion.  Borderline enlarged 9 mm right external iliac lymph node.  Moderate right inguinal hernia containing small bowel.  No obstruction or strangulation.  Patient presents with her granddaughter today.  She notes overall doing well.  She endorses about 59-month history of bleeding.  She is using pantiliners and reports changing these multiple times a day, uncommonly saturating through the pads themselves.  She describes the bleeding as dark brown or dark red, often occurring when she attempts to void.  Denies really bleeding through her clothing, often when she has missed position the panty liner.  Has some low intermittent pelvic pain.  Endorses discomfort when her bladder is full and she needs to urinate.  Often times is unable to empty her bladder when she goes to the bathroom.  Has some stress urinary incontinence, denies any change to incontinence recently. She also endorses associated symptoms of bladder fullness, intermittent pelvic discomfort, and a sensation of incomplete bladder emptying. She has baseline stress urinary incontinence without any notable worsening.   Her surgical history is noted above for removal of 1 tube and ovary for an adnexal mass.  By her description, it sounds like this was a dermoid tumor.  She subsequently underwent removal of  the contralateral tube and ovary about 18 months later in the setting of endometriosis.  She ultimately underwent a hysterectomy as well.  She does not remember whether anyone told her that her cervix had been removed at the time of her hysterectomy.  Medical history is notable for idiopathic pulmonary fibrosis and atrial fibrillation.  Recently treated with short course of steroids for increased shortness of breath.  The patient reports that she will be undergoing workup for her pulmonary hypertension soon. She also has a history of B-cell chronic lymphoproliferative disorder, followed by Dr. Onesimo.  PAST MEDICAL HISTORY:  Past Medical History:  Diagnosis Date   Chronic Leukemia    Depression    H/O hiatal hernia    Hernia    Hernia    right inguinal hernia   Hypertension    Hypothyroidism    Leg swelling    Osteitis pubis (HCC) 05/30/2001   Osteopenia 03/30/2009   Osteopenia 07/30/2022   Bone density 2021 showed elevated FRAX patient was started on Fosamax    Thyroid  disease      PAST SURGICAL HISTORY:  Past Surgical History:  Procedure Laterality Date   ABDOMINAL HYSTERECTOMY  03/1970   APPENDECTOMY  03/1966   BILATERAL SALPINGOOPHORECTOMY     CARDIOVERSION N/A 03/10/2022   Procedure: CARDIOVERSION;  Surgeon: Francyne Headland, MD;  Location: [facility];  Service: Cardiovascular;  Laterality: N/A;   CATARACT EXTRACTION W/PHACO Right 09/25/2014   Procedure: CATARACT EXTRACTION PHACO AND INTRAOCULAR LENS PLACEMENT (IOC);  Surgeon: Oneil T. Roz,  MD;  Location: [facility];  Service: Ophthalmology;  Laterality: Right;  CDE:7.56   CATARACT EXTRACTION W/PHACO Left 10/09/2014   Procedure: CATARACT EXTRACTION PHACO AND INTRAOCULAR LENS PLACEMENT LEFT EYE CDE=6.51;  Surgeon: Oneil T. Roz, MD;  Location: [facility];  Service: Ophthalmology;  Laterality: Left;   CHOLECYSTECTOMY  1980   COLONOSCOPY  1999   COLONOSCOPY  March 2007   Repeat 10 years   COLONOSCOPY  11/06/2003    Internal and  external hemorrhoids/Two tiny rectal distal sigmoid polyps cold biopsied/Otherwise within normal limits to the cecum   COLONOSCOPY N/A 08/29/2014   Dr. Shaaron: Sessile polyp was found at the cecum status post cold snare polypectomy, tubular adenoma. No further surveillance unless clinical status changes   ESOPHAGOGASTRODUODENOSCOPY  2007   Dr. Shaaron: normal esophagus, small tom oderate sized hiatal hernia, Cameron lesions   SKIN CANCER EXCISION Left 11 1 2016   left arm squamous cancer   TUMOR REMOVAL  03/1968   ovarian    OB/GYN HISTORY:  OB History  Gravida Para Term Preterm AB Living  1 1 1   1   SAB IAB Ectopic Multiple Live Births      1    # Outcome Date GA Lbr Len/2nd Weight Sex Type Anes PTL Lv  1 Term 04/13/55    M Vag-Spont   LIV    No LMP recorded. Patient has had a hysterectomy.  Age at menarche: unsure  Age at menopause: 79 Hx of HRT: yes Hx of STDs: denies Last pap: unsure History of abnormal pap smears: denies  SCREENING STUDIES:  Last mammogram: 2024  Last colonoscopy: 2015 Last bone mineral density: 2023  MEDICATIONS: Outpatient Encounter Medications as of 04/28/2024  Medication Sig   acetaminophen (TYLENOL) 650 MG CR tablet Take 650 mg by mouth every 8 (eight) hours as needed for pain.   albuterol  (VENTOLIN  HFA) 108 (90 Base) MCG/ACT inhaler Inhale 2 puffs into the lungs every 6 (six) hours as needed for wheezing or shortness of breath.   alendronate  (FOSAMAX ) 70 MG tablet TAKE 1 TABLET(70 MG) BY MOUTH EVERY 7 DAYS WITH A FULL GLASS OF WATER  AND ON AN EMPTY STOMACH   ALPRAZolam  (XANAX ) 0.5 MG tablet TAKE 1 TABLET BY MOUTH EVERY NIGHT AT BEDTIME. MAY TAKE 1/2 TABLET DURING THE DAY AS NEEDED   Cholecalciferol (VITAMIN D3) 2000 UNITS TABS Take 4,000 Units by mouth daily.   ELIQUIS  5 MG TABS tablet TAKE 1 TABLET(5 MG) BY MOUTH TWICE DAILY. FOLLOW-UP   indapamide  (LOZOL ) 1.25 MG tablet Take 1 tablet (1.25 mg total) by mouth every morning.   levothyroxine   (SYNTHROID ) 125 MCG tablet TAKE 1 TABLET BY MOUTH DAILY   metoprolol  succinate (TOPROL -XL) 25 MG 24 hr tablet TAKE 1 TABLET BY MOUTH DAILY   Multiple Vitamin (MULTI-VITAMIN DAILY PO) Take 1 tablet by mouth daily.   Nintedanib (OFEV ) 150 MG CAPS Take 1 capsule (150 mg total) by mouth 2 (two) times daily.   pantoprazole  (PROTONIX ) 40 MG tablet TAKE 1 TABLET(40 MG) BY MOUTH DAILY   potassium chloride  (KLOR-CON  M) 10 MEQ tablet TAKE 1 TABLET(10 MEQ) BY MOUTH DAILY   sertraline  (ZOLOFT ) 100 MG tablet TAKE 1 TABLET BY MOUTH EVERY DAY   No facility-administered encounter medications on file as of 04/28/2024.    ALLERGIES:  Allergies  Allergen Reactions   Erythromycin Shortness Of Breath   Aspirin Other (See Comments)    Caused internal bleeding. Patient had ulcer at the time. Instructed by PCP - Nancey not  to take any longer.   Ibuprofen     Instructed not to take by PCP - Nancey due to reaction to aspirin as well (precautionary).     FAMILY HISTORY:  Family History  Problem Relation Age of Onset   Dementia Mother    Heart disease Father    Cancer Father        throat   Cancer Sister        breast   Colon cancer Neg Hx    Lung disease Neg Hx    Autoimmune disease Neg Hx      SOCIAL HISTORY:  Social Connections: Moderately Integrated (04/05/2024)   Social Connection and Isolation Panel [NHANES]    Frequency of Communication with Friends and Family: More than three times a week    Frequency of Social Gatherings with Friends and Family: Twice a week    Attends Religious Services: More than 4 times per year    Active Member of [employer] or Organizations: Yes    Attends Banker Meetings: More than 4 times per year    Marital Status: Widowed    REVIEW OF SYSTEMS:  + Hearing loss, shortness of breath, diarrhea, abdominal pain, leg swelling, vaginal bleeding, back pain, problem with walking Denies appetite changes, fevers, chills, fatigue, unexplained weight changes. Denies  neck lumps or masses, mouth sores, ringing in ears or voice changes. Denies cough or wheezing.   Denies chest pain or palpitations.  Denies abdominal distention, blood in stools, constipation, nausea, vomiting, or early satiety. Denies pain with intercourse, dysuria, frequency, hematuria or incontinence. Denies hot flashes, pelvic pain or vaginal discharge.   Denies joint pain or muscle pain/cramps. Denies itching, rash, or wounds. Denies dizziness, headaches, numbness or seizures. Denies swollen lymph nodes or glands, denies easy bruising or bleeding. Denies anxiety, depression, confusion, or decreased concentration.  Physical Exam:  Vital Signs for this encounter:  Blood pressure 117/77, pulse 92, temperature 97.6 F (36.4 C), temperature source Tympanic, resp. rate 18, height 5' 5.5 (1.664 m), weight 195 lb 3.2 oz (88.5 kg), SpO2 97%. Body mass index is 31.99 kg/m. General: Alert, oriented, no acute distress.  HEENT: Normocephalic, atraumatic. Sclera anicteric.  Chest: Clear to auscultation bilaterally. No wheezes, rhonchi, or rales. Cardiovascular: Regular rate and rhythm, no murmurs, rubs, or gallops.  Abdomen: Normoactive bowel sounds. Soft, nondistended, nontender to palpation. No masses or hepatosplenomegaly appreciated. No palpable fluid wave.  Well-healed incisions, fullness in the right groin and low abdomen consistent with her hernia. Extremities: Grossly normal range of motion. Warm, well perfused. No edema bilaterally.  Skin: No rashes or lesions.  Lymphatics: No cervical, supraclavicular, or inguinal adenopathy.  GU:  Normal external female genitalia.  Atrophy noted.  No lesions. No discharge or bleeding.             Bladder/urethra:  No lesions or masses, well supported bladder             Vagina: Moderatey atrophic.  Upper vagina is replay by a necrotic mass with central cavitation that may actually be the upper vagina.  There is some fluid although not clearly urine.   On bimanual exam, the upper vagina is replaced by a firm mass that encompasses both the anterior and posterior aspect of the vagina.  This is better appreciated on rectal exam where there is bilateral extension into the parametria.             Uterus: Surgically absent.  Adnexa: No masses appreciated.  Rectal: See above.  With the patient's verbal consent, 30F foley catheter was placed under sterile conditions.  LABORATORY AND RADIOLOGIC DATA:  Outside medical records were reviewed to synthesize the above history, along with the history and physical obtained during the visit.   Lab Results  Component Value Date   WBC 12.7 (H) 12/09/2023   HGB 13.2 12/09/2023   HCT 39.7 12/09/2023   PLT 377 12/09/2023   GLUCOSE 91 12/09/2023   CHOL 166 04/14/2023   TRIG 82 04/14/2023   HDL 65 04/14/2023   LDLCALC 86 04/14/2023   ALT 9 12/09/2023   AST 17 12/09/2023   NA 136 12/09/2023   K 4.0 12/09/2023   CL 97 (L) 12/09/2023   CREATININE 1.07 (H) 12/09/2023   BUN 20 12/09/2023   CO2 33 (H) 12/09/2023   TSH 4.100 11/05/2023

## 2024-04-27 NOTE — Progress Notes (Unsigned)
 GYNECOLOGIC ONCOLOGY NEW PATIENT CONSULTATION   Patient Name: [name]  Patient Age: 88 y.o. Date of Service: 04/28/24 Referring Provider: Dr. 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  We spent some time today reviewing with the patient and her granddaughter recent biopsy, showing squamous cell carcinoma.  This biopsy was taken from the vaginal apex.  The patient had a hysterectomy years ago for benign indications and is not completely sure whether her cervix was removed at that time.  It is quite possible that this represents a squamous cell carcinoma of the cervix if she had a supracervical hysterectomy versus primary vaginal cancer.  We also discussed her recent imaging including PET scan and MRI.  I reviewed images both studies with the patient and her family.  We discussed evidence of metastatic disease on the PET scan with hypermetabolic right pelvic lymph node and evidence of local invasion with parametrial invasion on MRI.  Additionally, on MRI, there is some loss of plane between the anterior vagina and bladder suggestive of possible infiltration.  I have asked that additional stain be added to the patient's biopsy including PD-L1, HER2, p16, p53, MMR.  We will also request that her tumor be sent for next generation sequencing.  In the setting of advanced stage vaginal cancer, we discussed that surgery would not be curative and would be quite morbid.  She would also require adjuvant treatment, increasing possible toxicity.  We discussed that standard of care is radiation.  Often, consideration is made for concurrent chemotherapy for chemosensitization as an extrapolation from our cervical cancer literature.  Given her underlying medical comorbidities, including neuropathy and hearing loss, I discussed with Dr. Marton Sleeper that  she would be a better candidate for carboplatin rather than cisplatin for chemosensitization.  If her tumor is PD-L1 positive, I would suggest consideration of either concurrent immunotherapy with upfront treatment with maintenance immunotherapy or maintenance immunotherapy.  She voices on multiple occasions during her visit today difficulty with urination and feeling like she is retaining urine.  On exam, it is difficult to tell whether there is a fistula between the bladder and the vagina although I am not definitively able to see the urine in the vagina and I do not see evidence of this fistula on her imaging.  Given her reported retention, Foley catheter placement was discussed with her and ultimately performed.  Foley catheter teaching done prior to her discharge from clinic.    Given her daily bleeding, CBC was ordered.  Her white blood cell count was elevated, which is her baseline and expected in the setting of her history of B-cell lymphoma.  A copy of this note was sent to the patient's referring provider.   80 minutes of total time was spent for this patient encounter, including preparation, face-to-face counseling with the patient and coordination of care, and documentation of the encounter.  Wiley Hanger, MD  Division of Gynecologic Oncology  Department of Obstetrics and Gynecology  [facility]  ___________________________________________  Chief Complaint: Chief Complaint  Patient presents with   Vaginal cancer [facility])    History of Present Illness:  [name] is a 88 y.o. y.o. female who is seen in consultation at the request of Dr. Randolm Butte for an evaluation of vaginal biopsy showing squamous cell carcinoma.  04/05/2024: Patient was seen for postmenopausal bleeding  in the setting of prior hysterectomy.  Bleeding started 3 weeks previously. 04/05/24: Vaginal cuff biopsy shows invasive moderate to poorly differentiated squamous cell carcinoma, focally  keratinizing 04/07/24: PET scan shows hypermetabolic lesion along the upper third of the vaginal cuff posterior to the bladder compatible with malignancy.  8 mm right external iliac lymph node with an SUV of 6.6, compatible with metastatic disease.  Simple right mid to lower kidney cyst.  Right inguinal hernia with adipose tissue and loop of small bowel.  Hiatal hernia.  Moderate cardiomegaly. 04/07/24: Pelvic MRI shows concentric mass involving upper portion of the vaginal cuff with bilateral parametrial invasion.  Mild irregular bladder wall thickening involving bladder base, contiguous with the vaginal soft tissue mass, concerning for direct bladder invasion.  Borderline enlarged 9 mm right external iliac lymph node.  Moderate right inguinal hernia containing small bowel.  No obstruction or strangulation.  Patient presents with her granddaughter today.  She notes overall doing well.  She endorses about 51-month history of bleeding.  She is using pantiliners and reports changing these multiple times a day, uncommonly saturating through the pads themselves.  She describes the bleeding as dark brown or dark red, often occurring when she attempts to void.  Denies really bleeding through her clothing, often when she has missed position the panty liner.  Has some low intermittent pelvic pain.  Endorses discomfort when her bladder is full and she needs to urinate.  Often times is unable to empty her bladder when she goes to the bathroom.  Has some stress urinary incontinence, denies any change to incontinence recently. She also endorses associated symptoms of bladder fullness, intermittent pelvic discomfort, and a sensation of incomplete bladder emptying. She has baseline stress urinary incontinence without any notable worsening.   Her surgical history is noted above for removal of 1 tube and ovary for an adnexal mass.  By her description, it sounds like this was a dermoid tumor.  She subsequently underwent removal of  the contralateral tube and ovary about 18 months later in the setting of endometriosis.  She ultimately underwent a hysterectomy as well.  She does not remember whether anyone told her that her cervix had been removed at the time of her hysterectomy.  Medical history is notable for idiopathic pulmonary fibrosis and atrial fibrillation.  Recently treated with short course of steroids for increased shortness of breath.  The patient reports that she will be undergoing workup for her pulmonary hypertension soon. She also has a history of B-cell chronic lymphoproliferative disorder, followed by Dr. Salomon Cree.  PAST MEDICAL HISTORY:  Past Medical History:  Diagnosis Date   Chronic Leukemia    Depression    H/O hiatal hernia    Hernia    Hernia    right inguinal hernia   Hypertension    Hypothyroidism    Leg swelling    Osteitis pubis (HCC) 05/30/2001   Osteopenia 03/30/2009   Osteopenia 07/30/2022   Bone density 2021 showed elevated FRAX patient was started on Fosamax    Thyroid  disease      PAST SURGICAL HISTORY:  Past Surgical History:  Procedure Laterality Date   ABDOMINAL HYSTERECTOMY  03/1970   APPENDECTOMY  03/1966   BILATERAL SALPINGOOPHORECTOMY     CARDIOVERSION N/A 03/10/2022   Procedure: CARDIOVERSION;  Surgeon: Luana Rumple, MD;  Location: [facility];  Service: Cardiovascular;  Laterality: N/A;   CATARACT EXTRACTION W/PHACO Right 09/25/2014   Procedure: CATARACT EXTRACTION PHACO AND INTRAOCULAR LENS PLACEMENT (IOC);  Surgeon: Lavonia Powers T. Gennie Kicks,  MD;  Location: [facility];  Service: Ophthalmology;  Laterality: Right;  CDE:7.56   CATARACT EXTRACTION W/PHACO Left 10/09/2014   Procedure: CATARACT EXTRACTION PHACO AND INTRAOCULAR LENS PLACEMENT LEFT EYE CDE=6.51;  Surgeon: Lavonia Powers T. Gennie Kicks, MD;  Location: [facility];  Service: Ophthalmology;  Laterality: Left;   CHOLECYSTECTOMY  1980   COLONOSCOPY  1999   COLONOSCOPY  March 2007   Repeat 10 years   COLONOSCOPY  11/06/2003    Internal and  external hemorrhoids/Two tiny rectal distal sigmoid polyps cold biopsied/Otherwise within normal limits to the cecum   COLONOSCOPY N/A 08/29/2014   Dr. Riley Cheadle: Sessile polyp was found at the cecum status post cold snare polypectomy, tubular adenoma. No further surveillance unless clinical status changes   ESOPHAGOGASTRODUODENOSCOPY  2007   Dr. Riley Cheadle: normal esophagus, small tom oderate sized hiatal hernia, Cameron lesions   SKIN CANCER EXCISION Left 11 1 2016   left arm squamous cancer   TUMOR REMOVAL  03/1968   ovarian    OB/GYN HISTORY:  OB History  Gravida Para Term Preterm AB Living  1 1 1   1   SAB IAB Ectopic Multiple Live Births      1    # Outcome Date GA Lbr Len/2nd Weight Sex Type Anes PTL Lv  1 Term 04/13/55    M Vag-Spont   LIV    No LMP recorded. Patient has had a hysterectomy.  Age at menarche: unsure  Age at menopause: 5 Hx of HRT: yes Hx of STDs: denies Last pap: unsure History of abnormal pap smears: denies  SCREENING STUDIES:  Last mammogram: 2024  Last colonoscopy: 2015 Last bone mineral density: 2023  MEDICATIONS: Outpatient Encounter Medications as of 04/28/2024  Medication Sig   acetaminophen (TYLENOL) 650 MG CR tablet Take 650 mg by mouth every 8 (eight) hours as needed for pain.   albuterol  (VENTOLIN  HFA) 108 (90 Base) MCG/ACT inhaler Inhale 2 puffs into the lungs every 6 (six) hours as needed for wheezing or shortness of breath.   alendronate  (FOSAMAX ) 70 MG tablet TAKE 1 TABLET(70 MG) BY MOUTH EVERY 7 DAYS WITH A FULL GLASS OF WATER  AND ON AN EMPTY STOMACH   ALPRAZolam  (XANAX ) 0.5 MG tablet TAKE 1 TABLET BY MOUTH EVERY NIGHT AT BEDTIME. MAY TAKE 1/2 TABLET DURING THE DAY AS NEEDED   Cholecalciferol (VITAMIN D3) 2000 UNITS TABS Take 4,000 Units by mouth daily.   ELIQUIS  5 MG TABS tablet TAKE 1 TABLET(5 MG) BY MOUTH TWICE DAILY. FOLLOW-UP   indapamide  (LOZOL ) 1.25 MG tablet Take 1 tablet (1.25 mg total) by mouth every morning.   levothyroxine   (SYNTHROID ) 125 MCG tablet TAKE 1 TABLET BY MOUTH DAILY   metoprolol  succinate (TOPROL -XL) 25 MG 24 hr tablet TAKE 1 TABLET BY MOUTH DAILY   Multiple Vitamin (MULTI-VITAMIN DAILY PO) Take 1 tablet by mouth daily.   Nintedanib (OFEV ) 150 MG CAPS Take 1 capsule (150 mg total) by mouth 2 (two) times daily.   pantoprazole  (PROTONIX ) 40 MG tablet TAKE 1 TABLET(40 MG) BY MOUTH DAILY   potassium chloride  (KLOR-CON  M) 10 MEQ tablet TAKE 1 TABLET(10 MEQ) BY MOUTH DAILY   sertraline  (ZOLOFT ) 100 MG tablet TAKE 1 TABLET BY MOUTH EVERY DAY   No facility-administered encounter medications on file as of 04/28/2024.    ALLERGIES:  Allergies  Allergen Reactions   Erythromycin Shortness Of Breath   Aspirin Other (See Comments)    Caused internal bleeding. Patient had ulcer at the time. Instructed by PCP - Candance Certain not  to take any longer.   Ibuprofen     Instructed not to take by PCP - Candance Certain due to reaction to aspirin as well (precautionary).     FAMILY HISTORY:  Family History  Problem Relation Age of Onset   Dementia Mother    Heart disease Father    Cancer Father        throat   Cancer Sister        breast   Colon cancer Neg Hx    Lung disease Neg Hx    Autoimmune disease Neg Hx      SOCIAL HISTORY:  Social Connections: Moderately Integrated (04/05/2024)   Social Connection and Isolation Panel [NHANES]    Frequency of Communication with Friends and Family: More than three times a week    Frequency of Social Gatherings with Friends and Family: Twice a week    Attends Religious Services: More than 4 times per year    Active Member of [employer] or Organizations: Yes    Attends Banker Meetings: More than 4 times per year    Marital Status: Widowed    REVIEW OF SYSTEMS:  + Hearing loss, shortness of breath, diarrhea, abdominal pain, leg swelling, vaginal bleeding, back pain, problem with walking Denies appetite changes, fevers, chills, fatigue, unexplained weight changes. Denies  neck lumps or masses, mouth sores, ringing in ears or voice changes. Denies cough or wheezing.   Denies chest pain or palpitations.  Denies abdominal distention, blood in stools, constipation, nausea, vomiting, or early satiety. Denies pain with intercourse, dysuria, frequency, hematuria or incontinence. Denies hot flashes, pelvic pain or vaginal discharge.   Denies joint pain or muscle pain/cramps. Denies itching, rash, or wounds. Denies dizziness, headaches, numbness or seizures. Denies swollen lymph nodes or glands, denies easy bruising or bleeding. Denies anxiety, depression, confusion, or decreased concentration.  Physical Exam:  Vital Signs for this encounter:  Blood pressure 117/77, pulse 92, temperature 97.6 F (36.4 C), temperature source Tympanic, resp. rate 18, height 5' 5.5" (1.664 m), weight 195 lb 3.2 oz (88.5 kg), SpO2 97%. Body mass index is 31.99 kg/m. General: Alert, oriented, no acute distress.  HEENT: Normocephalic, atraumatic. Sclera anicteric.  Chest: Clear to auscultation bilaterally. No wheezes, rhonchi, or rales. Cardiovascular: Regular rate and rhythm, no murmurs, rubs, or gallops.  Abdomen: Normoactive bowel sounds. Soft, nondistended, nontender to palpation. No masses or hepatosplenomegaly appreciated. No palpable fluid wave.  Well-healed incisions, fullness in the right groin and low abdomen consistent with her hernia. Extremities: Grossly normal range of motion. Warm, well perfused. No edema bilaterally.  Skin: No rashes or lesions.  Lymphatics: No cervical, supraclavicular, or inguinal adenopathy.  GU:  Normal external female genitalia.  Atrophy noted.  No lesions. No discharge or bleeding.             Bladder/urethra:  No lesions or masses, well supported bladder             Vagina: Moderatey atrophic.  Upper vagina is replay by a necrotic mass with central cavitation that may actually be the upper vagina.  There is some fluid although not clearly urine.   On bimanual exam, the upper vagina is replaced by a firm mass that encompasses both the anterior and posterior aspect of the vagina.  This is better appreciated on rectal exam where there is bilateral extension into the parametria.             Uterus: Surgically absent.  Adnexa: No masses appreciated.  Rectal: See above.  With the patient's verbal consent, 41F foley catheter was placed under sterile conditions.  LABORATORY AND RADIOLOGIC DATA:  Outside medical records were reviewed to synthesize the above history, along with the history and physical obtained during the visit.   Lab Results  Component Value Date   WBC 12.7 (H) 12/09/2023   HGB 13.2 12/09/2023   HCT 39.7 12/09/2023   PLT 377 12/09/2023   GLUCOSE 91 12/09/2023   CHOL 166 04/14/2023   TRIG 82 04/14/2023   HDL 65 04/14/2023   LDLCALC 86 04/14/2023   ALT 9 12/09/2023   AST 17 12/09/2023   NA 136 12/09/2023   K 4.0 12/09/2023   CL 97 (L) 12/09/2023   CREATININE 1.07 (H) 12/09/2023   BUN 20 12/09/2023   CO2 33 (H) 12/09/2023   TSH 4.100 11/05/2023

## 2024-04-28 ENCOUNTER — Inpatient Hospital Stay: Attending: Gynecologic Oncology

## 2024-04-28 ENCOUNTER — Encounter: Payer: Self-pay | Admitting: Oncology

## 2024-04-28 ENCOUNTER — Telehealth: Payer: Self-pay | Admitting: Gynecologic Oncology

## 2024-04-28 ENCOUNTER — Inpatient Hospital Stay: Admitting: Hematology and Oncology

## 2024-04-28 ENCOUNTER — Encounter: Payer: Self-pay | Admitting: Gynecologic Oncology

## 2024-04-28 ENCOUNTER — Encounter: Payer: Self-pay | Admitting: Hematology and Oncology

## 2024-04-28 ENCOUNTER — Inpatient Hospital Stay: Admitting: Gynecologic Oncology

## 2024-04-28 VITALS — BP 117/77 | HR 92 | Temp 97.6°F | Resp 18 | Ht 65.5 in | Wt 195.2 lb

## 2024-04-28 VITALS — BP 156/69 | HR 79 | Temp 97.5°F | Resp 17 | Ht 65.5 in | Wt 195.4 lb

## 2024-04-28 DIAGNOSIS — R102 Pelvic and perineal pain: Secondary | ICD-10-CM

## 2024-04-28 DIAGNOSIS — N950 Postmenopausal bleeding: Secondary | ICD-10-CM

## 2024-04-28 DIAGNOSIS — G609 Hereditary and idiopathic neuropathy, unspecified: Secondary | ICD-10-CM | POA: Insufficient documentation

## 2024-04-28 DIAGNOSIS — C52 Malignant neoplasm of vagina: Secondary | ICD-10-CM

## 2024-04-28 DIAGNOSIS — R339 Retention of urine, unspecified: Secondary | ICD-10-CM | POA: Diagnosis not present

## 2024-04-28 DIAGNOSIS — G893 Neoplasm related pain (acute) (chronic): Secondary | ICD-10-CM

## 2024-04-28 DIAGNOSIS — Z90710 Acquired absence of both cervix and uterus: Secondary | ICD-10-CM | POA: Insufficient documentation

## 2024-04-28 DIAGNOSIS — J84112 Idiopathic pulmonary fibrosis: Secondary | ICD-10-CM | POA: Insufficient documentation

## 2024-04-28 DIAGNOSIS — H9193 Unspecified hearing loss, bilateral: Secondary | ICD-10-CM | POA: Diagnosis not present

## 2024-04-28 DIAGNOSIS — I4819 Other persistent atrial fibrillation: Secondary | ICD-10-CM | POA: Insufficient documentation

## 2024-04-28 DIAGNOSIS — Z7901 Long term (current) use of anticoagulants: Secondary | ICD-10-CM | POA: Insufficient documentation

## 2024-04-28 DIAGNOSIS — Z90722 Acquired absence of ovaries, bilateral: Secondary | ICD-10-CM | POA: Diagnosis not present

## 2024-04-28 LAB — CMP (CANCER CENTER ONLY)
ALT: 11 U/L (ref 0–44)
AST: 22 U/L (ref 15–41)
Albumin: 3.7 g/dL (ref 3.5–5.0)
Alkaline Phosphatase: 71 U/L (ref 38–126)
Anion gap: 10 (ref 5–15)
BUN: 16 mg/dL (ref 8–23)
CO2: 29 mmol/L (ref 22–32)
Calcium: 8.9 mg/dL (ref 8.9–10.3)
Chloride: 94 mmol/L — ABNORMAL LOW (ref 98–111)
Creatinine: 0.99 mg/dL (ref 0.44–1.00)
GFR, Estimated: 56 mL/min — ABNORMAL LOW (ref >60)
Glucose, Bld: 118 mg/dL — ABNORMAL HIGH (ref 70–99)
Potassium: 3.6 mmol/L (ref 3.5–5.1)
Sodium: 133 mmol/L — ABNORMAL LOW (ref 135–145)
Total Bilirubin: 0.5 mg/dL (ref 0.0–1.2)
Total Protein: 7.6 g/dL (ref 6.5–8.1)

## 2024-04-28 LAB — CBC (CANCER CENTER ONLY)
HCT: 38 % (ref 36.0–46.0)
Hemoglobin: 12.8 g/dL (ref 12.0–15.0)
MCH: 30.8 pg (ref 26.0–34.0)
MCHC: 33.7 g/dL (ref 30.0–36.0)
MCV: 91.3 fL (ref 80.0–100.0)
Platelet Count: 383 10*3/uL (ref 150–400)
RBC: 4.16 MIL/uL (ref 3.87–5.11)
RDW: 13.7 % (ref 11.5–15.5)
WBC Count: 14.9 10*3/uL — ABNORMAL HIGH (ref 4.0–10.5)
nRBC: 0 % (ref 0.0–0.2)

## 2024-04-28 NOTE — Assessment & Plan Note (Signed)
 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  I personally reviewed [name] PET/CT imaging and MRI I explained to the patient why upfront surgery is not feasible We discussed the role of concurrent chemoradiation therapy Further molecular testing in progress She is scheduled to see radiation oncologist in about 2 weeks In preparation for upcoming treatment, I will arrange for chemo education class and port placement Labs today are reviewed and appear satisfactory The best treatment for [name] would be concurrent chemotherapy with cisplatin but given [name] significant pre-existing peripheral neuropathy and poor hearing, I doubt cisplatin is a good choice for [name] I will probably prescribe concurrent treatment with carboplatin

## 2024-04-28 NOTE — Telephone Encounter (Signed)
 Patient called with significant discomfort with catheter. Discussed options including trial of Azo or pyridium or removal. She will take an Azo now. I offered to leave empty syringes at the hospital front desk if she needs the catheter out over the weekend. Communicated this with Ruthann Cover, who would come pick up the syringes and voices knowing how to remove fluid from foley bulb to remove foley.  Wiley Hanger MD Gynecologic Oncology

## 2024-04-28 NOTE — Progress Notes (Signed)
 [facility] CONSULT NOTE  Patient Care Team: [name], MD as PCP - General (Family Medicine) [name], [name], MD as PCP - Cardiology (Cardiology) Riley Cheadle Windsor Hatcher, MD as Consulting Physician (Gastroenterology)  ASSESSMENT & PLAN:  Vaginal cancer [facility]) 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  I personally reviewed her PET/CT imaging and MRI I explained to the patient why upfront surgery is not feasible We discussed the role of concurrent chemoradiation therapy Further molecular testing in progress She is scheduled to see radiation oncologist in about 2 weeks In preparation for upcoming treatment, I will arrange for chemo education class and port placement Labs today are reviewed and appear satisfactory The best treatment for her would be concurrent chemotherapy with cisplatin but given her significant pre-existing peripheral neuropathy and poor hearing, I doubt cisplatin is a good choice for her I will probably prescribe concurrent treatment with carboplatin   Persistent atrial fibrillation (HCC) She has no signs of congestive heart failure Her bleeding is not severe while on anticoagulation therapy For now, she will continue her medical management of chronic atrial fibrillation along with anticoagulation treatment  Idiopathic pulmonary fibrosis (HCC) I will reach out to her pulmonologist to see if it is feasible to continue on Ofev  while on chemotherapy  Idiopathic peripheral neuropathy The cause is unknown but the patient had pre-existing severe peripheral neuropathy I do not believe cisplatin is a viable option for her  Hearing deficit, bilateral She has severe bilateral hearing deficit I recommend the patient to get another new baseline hearing test done before we start  chemotherapy as platinum agent can cause ototoxicity  Cancer associated pain She had recent severe cancer associated pain However, she has not try over-the-counter analgesics yet I encouraged the patient to try over-the-counter analgesics if she has recurrent pain and if that is not effective, to contact me for something stronger  Orders Placed This Encounter  Procedures   IR IMAGING GUIDED PORT INSERTION    Standing Status:   Future    Expected Date:   05/12/2024    Expiration Date:   04/28/2025    Reason for Exam (SYMPTOM  OR DIAGNOSIS REQUIRED):   need port for chemo    Preferred Imaging Location?:   [facility]    The total time spent in the appointment was 60 minutes encounter with patients including review of chart and various tests results, discussions about plan of care and coordination of care plan   All questions were answered. The patient knows to call the clinic with any problems, questions or concerns. No barriers to learning was detected.  Almeda Jacobs, MD 5/30/20252:27 PM  CHIEF COMPLAINTS/PURPOSE OF CONSULTATION:  Vaginal cancer, for further management  HISTORY OF PRESENTING ILLNESS:  [name] 88 y.o. female is here because of recent diagnosis of vaginal cancer She is here accompanied by her granddaughter in law Multiple family members live close to the patient She is retired She has background history of multiple comorbidities including idiopathic pulmonary fibrosis, chronic atrial fibrillation on chronic anticoagulation therapy, pulmonary hypertension, chronic severe bilateral hearing loss, idiopathic peripheral neuropathy and others She presented with pressure sensation, intermittent abdominal pain as well as postmenopausal bleeding leading to further workup and recent diagnosis of vaginal cancer She has vaginal spotting but not excessive bleeding  I have reviewed her chart and materials related to her  cancer extensively and collaborated history with  the patient. Summary of oncologic history is as follows: Oncology History  Vaginal cancer (HCC)  03/29/2024 Initial Diagnosis   She presented to see her primary care doctor due to postmenopausal bleeding as well as symptoms of dysuria, frequency, urgency and pelvic pressure associated with urinary incontinence.  She was subsequently seen by gynecologist who performed examination and biopsy which revealed diagnosis of vaginal cancer   04/05/2024 Pathology Results   SURGICAL PATHOLOGY  CASE: [id number]  PATIENT: Pearlie Klontz  Surgical Pathology Report   Clinical History: PMB (cm)   FINAL MICROSCOPIC DIAGNOSIS:   A. VAGINAL CUFF, APEX, BIOPSY:  Invasive moderate to poorly differentiated squamous cell carcinoma, focally keratinizing  Tumor present in all margins    04/20/2024 Imaging   NM PET Image Initial (PI) Whole Body (F-18 FDG) Result Date: 04/26/2024 CLINICAL DATA:  Initial treatment strategy for vaginal cancer. EXAM: NUCLEAR MEDICINE PET WHOLE BODY TECHNIQUE: 9.4 mCi F-18 FDG was injected intravenously. Full-ring PET imaging was performed from the head to foot after the radiotracer. CT data was obtained and used for attenuation correction and anatomic localization. Fasting blood glucose: 105 mg/dl COMPARISON:  Pelvic MRI 04/20/2024 FINDINGS: Mediastinal blood pool activity: SUV max 3.0 HEAD/NECK: No significant abnormal hypermetabolic activity in this region. Incidental CT findings: none CHEST: No significant abnormal hypermetabolic activity in this region. Incidental CT findings: Aortic and branch vessel atherosclerosis. Moderate cardiomegaly. Mild nonspecific peripheral interstitial accentuation in the lungs, cannot exclude mild interstitial edema. ABDOMEN/PELVIS: Posterior to the urinary bladder along the upper third of the vaginal cuff, a hypermetabolic lesion has maximum SUV of 25.5, compatible with malignancy. A right external iliac node measuring 0.8 cm in short axis on image 184  series 4 has maximum SUV of 6.6, compatible with malignant involvement. Along the anterior margin of an otherwise simple right mid to lower kidney cyst, a 1.2 by 2.3 cm higher density component is present on image 137 of series 4. This could simply reflect blood products of a complex cyst, but I can confidently exclude low-grade metabolic activity. Dedicated renal protocol MRI with and without contrast is recommended for definitive characterization. Incidental CT findings: Hiatal hernia with adjacent postoperative findings under is metal clips in the left upper quadrant. Atherosclerosis is present, including aortoiliac atherosclerotic disease. Right inguinal hernia contains adipose tissue and a loop of small bowel without findings of strangulation or obstruction. SKELETON: No significant abnormal hypermetabolic activity in this region. Incidental CT findings: none EXTREMITIES: No significant abnormal hypermetabolic activity in this region. Incidental CT findings: Incidental fatty components of the medial head gastrocnemius muscle on the right. IMPRESSION: 1. Hypermetabolic lesion along the upper third of the vaginal cuff posterior to the urinary bladder, compatible with malignancy. 2. A 0.8 cm right external iliac node has maximum SUV of 6.6, compatible with malignant involvement. 3. Along the anterior margin of an otherwise simple right mid to lower kidney cyst, a 1.2 by 2.3 cm higher density component is present. This could simply reflect blood products of a complex cyst, but I can confidently exclude low-grade metabolic activity. Dedicated renal protocol MRI with and without contrast is recommended for definitive characterization, to exclude synchronous renal mass. 4. Right inguinal hernia contains adipose tissue and a loop of small bowel without findings of strangulation or obstruction. 5. Hiatal hernia with adjacent postoperative findings under is metal clips in the left upper quadrant. 6. Moderate  cardiomegaly. Mild nonspecific peripheral interstitial accentuation in the lungs, cannot exclude mild interstitial edema.  7.  Aortic Atherosclerosis (ICD10-I70.0). Electronically Signed   By: Freida Jes M.D.   On: 04/26/2024 21:03   MR PELVIS W WO CONTRAST Result Date: 04/26/2024 CLINICAL DATA:  Vaginal carcinoma. EXAM: MRI PELVIS WITHOUT AND WITH CONTRAST TECHNIQUE: Multiplanar multisequence MR imaging of the pelvis was performed both before and after administration of intravenous contrast. CONTRAST:  9mL GADAVIST GADOBUTROL 1 MMOL/ML IV SOLN COMPARISON:  None Available. FINDINGS: Lower Urinary Tract: Mild irregular wall thickening is seen involving the bladder base, which is contiguous with the vaginal soft tissue mass described below, suspicious for direct bladder invasion. Bowel: Unremarkable pelvic bowel loops. Vascular/Lymphatic: 9 mm right external iliac lymph node is seen on image 16/3, which is borderline enlarged. No pathologically enlarged lymph nodes identified. Reproductive: -- Uterus: Prior hysterectomy. Concentric masslike wall thickening is seen involving the upper portion of the vaginal cuff, measuring approximately 3.8 x 2.6 x 3.5 cm. Irregular contour is seen with the adjacent parametrial fat bilaterally, consistent with parametrial invasion. No evidence of distal ureteral involvement or hydroureteronephrosis. -- Right ovary: Not visualized, however no adnexal mass identified. -- Left ovary:  Not visualized, however no adnexal mass identified. Other: No peritoneal thickening or abnormal free fluid. Moderate right inguinal hernia is seen which contains small bowel. No evidence of bowel obstruction or strangulation. Musculoskeletal:  Unremarkable. IMPRESSION: Concentric mass involving the upper portion of the vaginal cuff, with evidence of bilateral parametrial invasion. Mild irregular bladder wall thickening involving the bladder base, which is contiguous with the vaginal soft tissue  mass, highly suspicious for direct bladder invasion. Borderline enlarged 9 mm right external iliac lymph node. Consider PET-CT scan for further evaluation. Moderate right inguinal hernia containing small bowel. No evidence of bowel obstruction or strangulation. Electronically Signed   By: Marlyce Sine M.D.   On: 04/26/2024 16:08      04/28/2024 Initial Diagnosis   Vaginal cancer (HCC)   04/28/2024 Cancer Staging   Staging form: Vagina, AJCC 8th Edition - Clinical stage from 04/28/2024: FIGO Stage IVA (cT4, cN1, cM0) - Signed by Almeda Jacobs, MD on 04/28/2024 Stage prefix: Initial diagnosis     MEDICAL HISTORY:  Past Medical History:  Diagnosis Date   Chronic Leukemia    Depression    H/O hiatal hernia    Hernia    Hernia    right inguinal hernia   Hypertension    Hypothyroidism    Leg swelling    Osteitis pubis (HCC) 05/30/2001   Osteopenia 03/30/2009   Osteopenia 07/30/2022   Bone density 2021 showed elevated FRAX patient was started on Fosamax    Thyroid  disease     SURGICAL HISTORY: Past Surgical History:  Procedure Laterality Date   ABDOMINAL HYSTERECTOMY  03/1970   APPENDECTOMY  03/1966   BILATERAL SALPINGOOPHORECTOMY     CARDIOVERSION N/A 03/10/2022   Procedure: CARDIOVERSION;  Surgeon: Luana Rumple, MD;  Location: [facility];  Service: Cardiovascular;  Laterality: N/A;   CATARACT EXTRACTION W/PHACO Right 09/25/2014   Procedure: CATARACT EXTRACTION PHACO AND INTRAOCULAR LENS PLACEMENT (IOC);  Surgeon: Lavonia Powers T. Gennie Kicks, MD;  Location: [facility];  Service: Ophthalmology;  Laterality: Right;  CDE:7.56   CATARACT EXTRACTION W/PHACO Left 10/09/2014   Procedure: CATARACT EXTRACTION PHACO AND INTRAOCULAR LENS PLACEMENT LEFT EYE CDE=6.51;  Surgeon: Lavonia Powers T. Gennie Kicks, MD;  Location: [facility];  Service: Ophthalmology;  Laterality: Left;   CHOLECYSTECTOMY  1980   COLONOSCOPY  1999   COLONOSCOPY  March 2007   Repeat 10 years   COLONOSCOPY  11/06/2003  Internal and external  hemorrhoids/Two tiny rectal distal sigmoid polyps cold biopsied/Otherwise within normal limits to the cecum   COLONOSCOPY N/A 08/29/2014   Dr. Riley Cheadle: Sessile polyp was found at the cecum status post cold snare polypectomy, tubular adenoma. No further surveillance unless clinical status changes   ESOPHAGOGASTRODUODENOSCOPY  2007   Dr. 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   Widowed since July 04, 2021. Husband W.E. Patel, died from Kidney Cancer.   Son-Tommy lives nearby.   2 grandson's deceased, Clint and Ace Holder. Ace Holder passed 02/2022 from Brain Cancer.    Social Drivers of Corporate investment banker Strain: Low Risk  (04/05/2024)   Overall Financial Resource Strain (CARDIA)    Difficulty of Paying Living Expenses: Not hard at all  Food Insecurity: No Food Insecurity (04/05/2024)   Hunger Vital Sign    Worried About Running Out of Food in the Last Year: Never true    Ran Out of Food in the Last Year: Never true  Transportation Needs: No Transportation Needs (04/05/2024)   PRAPARE - Doctor, general practice (Medical): No    Lack of Transportation (Non-Medical): No  Physical Activity: Insufficiently Active (04/05/2024)   Exercise Vital Sign    Days of Exercise per Week: 2 days    Minutes of Exercise per Session: 10 min  Stress: No Stress Concern Present (04/05/2024)   [employer] of Occupational Health - Occupational Stress Questionnaire    Feeling of Stress : Only a little  Social Connections: Moderately Integrated (04/05/2024)   Social Connection and Isolation Panel [NHANES]    Frequency of Communication with Friends and Family: More than three times a week    Frequency of Social Gatherings with Friends and Family: Twice a week    Attends Religious Services: More than 4 times per year    Active Member of [employer] or Organizations: Yes    Attends Banker Meetings: More than 4 times per year    Marital Status: Widowed  Intimate Partner Violence: Not At Risk (04/05/2024)   Humiliation, Afraid, Rape, and Kick questionnaire    Fear of Current or Ex-Partner: No    Emotionally Abused: No    Physically Abused: No    Sexually Abused: No    FAMILY HISTORY: Family History  Problem Relation Age of Onset   Dementia Mother    Heart disease Father    Cancer Father  throat   Cancer Sister        breast   Colon cancer Neg Hx    Lung disease Neg Hx    Autoimmune disease Neg Hx     ALLERGIES:  is allergic to erythromycin, aspirin, and ibuprofen.  MEDICATIONS:  Current Outpatient Medications  Medication Sig Dispense Refill   acetaminophen (TYLENOL) 650 MG CR tablet Take 650 mg by mouth every 8 (eight) hours as needed for pain.     albuterol  (VENTOLIN  HFA) 108 (90 Base) MCG/ACT inhaler Inhale 2 puffs into the lungs every 6 (six) hours as needed for wheezing or shortness of breath. 18 g 1   alendronate  (FOSAMAX ) 70 MG tablet TAKE 1 TABLET(70 MG) BY MOUTH EVERY 7 DAYS WITH A FULL GLASS OF WATER  AND ON AN EMPTY STOMACH 4 tablet 3   ALPRAZolam  (XANAX ) 0.5 MG tablet  TAKE 1 TABLET BY MOUTH EVERY NIGHT AT BEDTIME. MAY TAKE 1/2 TABLET DURING THE DAY AS NEEDED 45 tablet 4   Cholecalciferol (VITAMIN D3) 2000 UNITS TABS Take 4,000 Units by mouth daily.     ELIQUIS  5 MG TABS tablet TAKE 1 TABLET(5 MG) BY MOUTH TWICE DAILY. FOLLOW-UP 180 tablet 1   indapamide  (LOZOL ) 1.25 MG tablet Take 1 tablet (1.25 mg total) by mouth every morning. 90 tablet 0   levothyroxine  (SYNTHROID ) 125 MCG tablet TAKE 1 TABLET BY MOUTH DAILY 90 tablet 1   metoprolol  succinate (TOPROL -XL) 25 MG 24 hr tablet TAKE 1 TABLET BY MOUTH DAILY 90 tablet 1   Multiple Vitamin (MULTI-VITAMIN DAILY PO) Take 1 tablet by mouth daily.     Nintedanib (OFEV ) 150 MG CAPS Take 1 capsule (150 mg total) by mouth 2 (two) times daily. 180 capsule 1   pantoprazole  (PROTONIX ) 40 MG tablet TAKE 1 TABLET(40 MG) BY MOUTH DAILY 90 tablet 1   potassium chloride  (KLOR-CON  M) 10 MEQ tablet TAKE 1 TABLET(10 MEQ) BY MOUTH DAILY 90 tablet 1   sertraline  (ZOLOFT ) 100 MG tablet TAKE 1 TABLET BY MOUTH EVERY DAY 90 tablet 1   No current facility-administered medications for this visit.    REVIEW OF SYSTEMS: She has chronic shortness of breath at baseline but not on oxygen.  She has severe bilateral hearing deficit.  No recent chest pain.  She has severe peripheral neuropathy  All other systems were reviewed with the patient and are negative.  PHYSICAL EXAMINATION: ECOG PERFORMANCE STATUS: 1 - Symptomatic but completely ambulatory  Vitals:   04/28/24 1342  BP: (!) 156/69  Pulse: 79  Resp: 17  Temp: (!) 97.5 F (36.4 C)  SpO2: 97%   Filed Weights   04/28/24 1342  Weight: 195 lb 6.4 oz (88.6 kg)    GENERAL:alert, no distress and comfortable SKIN: skin color, texture, turgor are normal, no rashes or significant lesions EYES: normal, conjunctiva are pink and non-injected, sclera clear OROPHARYNX:no exudate, no erythema and lips, buccal mucosa, and tongue normal  NECK: supple, thyroid  normal size, non-tender, without  nodularity LYMPH:  no palpable lymphadenopathy in the cervical, axillary or inguinal LUNGS: Noted mild bilateral crackles HEART: irregular rate & rhythm and no murmurs and no lower extremity edema ABDOMEN:abdomen soft, non-tender and normal bowel sounds Musculoskeletal:no cyanosis of digits and no clubbing  PSYCH: alert & oriented x 3 with fluent speech NEURO: no focal motor/sensory deficits  LABORATORY DATA:  I have reviewed the data as listed Lab Results  Component Value Date   WBC 14.9 (H) 04/28/2024   HGB 12.8 04/28/2024   HCT  38.0 04/28/2024   MCV 91.3 04/28/2024   PLT 383 04/28/2024   Recent Labs    08/17/23 1111 11/05/23 0943 12/09/23 1010  NA  --  138 136  K  --  4.1 4.0  CL  --  97 97*  CO2  --  26 33*  GLUCOSE  --  80 91  BUN  --  13 20  CREATININE  --  0.98 1.07*  CALCIUM  --  9.5 9.8  GFRNONAA  --   --  51*  PROT 7.1  --  7.5  ALBUMIN 4.1  --  4.0  AST 22  --  17  ALT 12  --  9  ALKPHOS 87  --  62  BILITOT 0.3  --  0.5  BILIDIR 0.10  --   --     RADIOGRAPHIC STUDIES: I have personally reviewed the radiological images as listed and agreed with the findings in the report. NM PET Image Initial (PI) Whole Body (F-18 FDG) Result Date: 04/26/2024 CLINICAL DATA:  Initial treatment strategy for vaginal cancer. EXAM: NUCLEAR MEDICINE PET WHOLE BODY TECHNIQUE: 9.4 mCi F-18 FDG was injected intravenously. Full-ring PET imaging was performed from the head to foot after the radiotracer. CT data was obtained and used for attenuation correction and anatomic localization. Fasting blood glucose: 105 mg/dl COMPARISON:  Pelvic MRI 04/20/2024 FINDINGS: Mediastinal blood pool activity: SUV max 3.0 HEAD/NECK: No significant abnormal hypermetabolic activity in this region. Incidental CT findings: none CHEST: No significant abnormal hypermetabolic activity in this region. Incidental CT findings: Aortic and branch vessel atherosclerosis. Moderate cardiomegaly. Mild nonspecific  peripheral interstitial accentuation in the lungs, cannot exclude mild interstitial edema. ABDOMEN/PELVIS: Posterior to the urinary bladder along the upper third of the vaginal cuff, a hypermetabolic lesion has maximum SUV of 25.5, compatible with malignancy. A right external iliac node measuring 0.8 cm in short axis on image 184 series 4 has maximum SUV of 6.6, compatible with malignant involvement. Along the anterior margin of an otherwise simple right mid to lower kidney cyst, a 1.2 by 2.3 cm higher density component is present on image 137 of series 4. This could simply reflect blood products of a complex cyst, but I can confidently exclude low-grade metabolic activity. Dedicated renal protocol MRI with and without contrast is recommended for definitive characterization. Incidental CT findings: Hiatal hernia with adjacent postoperative findings under is metal clips in the left upper quadrant. Atherosclerosis is present, including aortoiliac atherosclerotic disease. Right inguinal hernia contains adipose tissue and a loop of small bowel without findings of strangulation or obstruction. SKELETON: No significant abnormal hypermetabolic activity in this region. Incidental CT findings: none EXTREMITIES: No significant abnormal hypermetabolic activity in this region. Incidental CT findings: Incidental fatty components of the medial head gastrocnemius muscle on the right. IMPRESSION: 1. Hypermetabolic lesion along the upper third of the vaginal cuff posterior to the urinary bladder, compatible with malignancy. 2. A 0.8 cm right external iliac node has maximum SUV of 6.6, compatible with malignant involvement. 3. Along the anterior margin of an otherwise simple right mid to lower kidney cyst, a 1.2 by 2.3 cm higher density component is present. This could simply reflect blood products of a complex cyst, but I can confidently exclude low-grade metabolic activity. Dedicated renal protocol MRI with and without contrast is  recommended for definitive characterization, to exclude synchronous renal mass. 4. Right inguinal hernia contains adipose tissue and a loop of small bowel without findings of strangulation or obstruction. 5. Hiatal hernia with  adjacent postoperative findings under is metal clips in the left upper quadrant. 6. Moderate cardiomegaly. Mild nonspecific peripheral interstitial accentuation in the lungs, cannot exclude mild interstitial edema. 7.  Aortic Atherosclerosis (ICD10-I70.0). Electronically Signed   By: Freida Jes M.D.   On: 04/26/2024 21:03   MR PELVIS W WO CONTRAST Result Date: 04/26/2024 CLINICAL DATA:  Vaginal carcinoma. EXAM: MRI PELVIS WITHOUT AND WITH CONTRAST TECHNIQUE: Multiplanar multisequence MR imaging of the pelvis was performed both before and after administration of intravenous contrast. CONTRAST:  9mL GADAVIST GADOBUTROL 1 MMOL/ML IV SOLN COMPARISON:  None Available. FINDINGS: Lower Urinary Tract: Mild irregular wall thickening is seen involving the bladder base, which is contiguous with the vaginal soft tissue mass described below, suspicious for direct bladder invasion. Bowel: Unremarkable pelvic bowel loops. Vascular/Lymphatic: 9 mm right external iliac lymph node is seen on image 16/3, which is borderline enlarged. No pathologically enlarged lymph nodes identified. Reproductive: -- Uterus: Prior hysterectomy. Concentric masslike wall thickening is seen involving the upper portion of the vaginal cuff, measuring approximately 3.8 x 2.6 x 3.5 cm. Irregular contour is seen with the adjacent parametrial fat bilaterally, consistent with parametrial invasion. No evidence of distal ureteral involvement or hydroureteronephrosis. -- Right ovary: Not visualized, however no adnexal mass identified. -- Left ovary:  Not visualized, however no adnexal mass identified. Other: No peritoneal thickening or abnormal free fluid. Moderate right inguinal hernia is seen which contains small bowel. No  evidence of bowel obstruction or strangulation. Musculoskeletal:  Unremarkable. IMPRESSION: Concentric mass involving the upper portion of the vaginal cuff, with evidence of bilateral parametrial invasion. Mild irregular bladder wall thickening involving the bladder base, which is contiguous with the vaginal soft tissue mass, highly suspicious for direct bladder invasion. Borderline enlarged 9 mm right external iliac lymph node. Consider PET-CT scan for further evaluation. Moderate right inguinal hernia containing small bowel. No evidence of bowel obstruction or strangulation. Electronically Signed   By: Marlyce Sine M.D.   On: 04/26/2024 16:08

## 2024-04-28 NOTE — Assessment & Plan Note (Signed)
 I will reach out to her pulmonologist to see if it is feasible to continue on Ofev  while on chemotherapy

## 2024-04-28 NOTE — Assessment & Plan Note (Signed)
 The cause is unknown but the patient had pre-existing severe peripheral neuropathy I do not believe cisplatin is a viable option for her

## 2024-04-28 NOTE — Assessment & Plan Note (Signed)
 She has severe bilateral hearing deficit I recommend the patient to get another new baseline hearing test done before we start chemotherapy as platinum agent can cause ototoxicity

## 2024-04-28 NOTE — Assessment & Plan Note (Signed)
 She had recent severe cancer associated pain However, she has not try over-the-counter analgesics yet I encouraged the patient to try over-the-counter analgesics if she has recurrent pain and if that is not effective, to contact me for something stronger

## 2024-04-28 NOTE — Progress Notes (Signed)
 Referral to radiation oncology placed per Dr. Pricilla Holm.

## 2024-04-28 NOTE — Progress Notes (Signed)
 Requested PD-L1, Her2, p16, p53 and MMR testing to accession [id number] with [facility] per Dr. Orvil Bland.

## 2024-04-28 NOTE — Patient Instructions (Signed)
 It was very nice to meet you today.  We are working on appointments for you to see Dr. Marton Sleeper and Dr. Eloise Hake who will help get your chemotherapy and radiation therapy set up.

## 2024-04-28 NOTE — Assessment & Plan Note (Signed)
 She has no signs of congestive heart failure Her bleeding is not severe while on anticoagulation therapy For now, she will continue her medical management of chronic atrial fibrillation along with anticoagulation treatment

## 2024-04-30 ENCOUNTER — Telehealth: Payer: Self-pay | Admitting: Gynecologic Oncology

## 2024-04-30 NOTE — Telephone Encounter (Signed)
 Spoke with the patient. She ultimately had Brooke remove the catheter. Had some difficulty urinating initially, but seems to be improved now.  Wiley Hanger MD Gynecologic Oncology

## 2024-05-01 ENCOUNTER — Encounter: Payer: Self-pay | Admitting: Oncology

## 2024-05-01 ENCOUNTER — Ambulatory Visit: Admitting: Family Medicine

## 2024-05-01 VITALS — BP 138/89 | HR 88 | Temp 97.9°F | Ht 65.5 in | Wt 191.6 lb

## 2024-05-01 DIAGNOSIS — G609 Hereditary and idiopathic neuropathy, unspecified: Secondary | ICD-10-CM

## 2024-05-01 DIAGNOSIS — M792 Neuralgia and neuritis, unspecified: Secondary | ICD-10-CM

## 2024-05-01 DIAGNOSIS — H9193 Unspecified hearing loss, bilateral: Secondary | ICD-10-CM | POA: Diagnosis not present

## 2024-05-01 LAB — SURGICAL PATHOLOGY

## 2024-05-01 MED ORDER — PREGABALIN 25 MG PO CAPS: ORAL_CAPSULE | ORAL | 5 refills | Status: DC

## 2024-05-01 NOTE — Progress Notes (Signed)
   Subjective:    Patient ID: [name], female    DOB: September 09, 1936, 88 y.o.   MRN: [id number]  HPI Pt would like a referral to Dr Darlin Ehrlich due to hearing lose.   Pt also feels that she has neuropathy in feet. Medication and allergies reviewed. 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 Idiopathic peripheral neuropathy (Primary) Check B12 Lyrica discussed Patient willing to try Side effects discussed GERD discussion 25 mg nightly for 4 days then 1 twice daily Patient to give us  feedback in 30 days  - Vitamin B12  2. Neuropathic pain Lyrica as discussed above Side effects discussed if side effects stop medicine  - Vitamin B12  3. Hearing deficit, bilateral Referral to Dr.Teoh for further evaluation and hearing aids - Ambulatory referral to ENT Follow-up as planned in August

## 2024-05-01 NOTE — Patient Instructions (Addendum)
 Hi Majestic  You have neuropathy-this could get worse with chemotherapy.  Unfortunately there are no medications that can help make the neuropathy go away.  We will check a B12 level because that is associated with neuropathy.  What you are experiencing is neuropathic pain from the neuropathy.  This particular pain can cause sharp stabbing feelings in your feet.  Medications can lessen the severity of the pain.  The medication will not remove the numbness or thickness feeling that you have in your feet.  The medication that we are utilizing is pregabalin.  Lyrica is the other name.  I would recommend starting off with just 1 in the evening for the first 4 days then after that 1 twice daily.  We will have you do a follow-up with myself in August.  If you feel the medication is causing you any problems please call us  or send us  a MyChart message.  Most people tolerate the medication well but a small number of people if it causes him to feel excessively drowsy, dizzy, fatigued then it would t be best to stop the medicine  Should you have any additional concerns let us  know thanks-Dr. Geralyn Knee

## 2024-05-01 NOTE — Progress Notes (Signed)
 Order requisition sent to [facility] on accession [id number] per Dr. Orvil Bland.

## 2024-05-03 ENCOUNTER — Telehealth: Payer: Self-pay | Admitting: Licensed Clinical Social Worker

## 2024-05-03 NOTE — Telephone Encounter (Signed)
 [facility] Clinical Social Work  Clinical Social Work was referred by new patient protocol for assessment of psychosocial needs.  Clinical Social Worker attempted to contact patient by phone to offer support and assess for needs.   No answer. Left VM with direct contact information and brief description of support services.     Leata Dominy E Malahki Gasaway, LCSW  Clinical Social Worker [facility]

## 2024-05-05 ENCOUNTER — Ambulatory Visit (HOSPITAL_COMMUNITY)
Admission: RE | Admit: 2024-05-05 | Discharge: 2024-05-05 | Disposition: A | Discharge status: Home / Self Care | Source: Ambulatory Visit | Attending: Emergency Medicine | Admitting: Emergency Medicine

## 2024-05-05 ENCOUNTER — Ambulatory Visit: Payer: Self-pay | Admitting: Internal Medicine

## 2024-05-05 ENCOUNTER — Telehealth: Payer: Self-pay | Admitting: *Deleted

## 2024-05-05 ENCOUNTER — Telehealth: Payer: Self-pay

## 2024-05-05 ENCOUNTER — Other Ambulatory Visit: Payer: Self-pay | Admitting: Hematology and Oncology

## 2024-05-05 DIAGNOSIS — I272 Pulmonary hypertension, unspecified: Secondary | ICD-10-CM | POA: Diagnosis not present

## 2024-05-05 DIAGNOSIS — I5032 Chronic diastolic (congestive) heart failure: Secondary | ICD-10-CM

## 2024-05-05 LAB — ECHOCARDIOGRAM COMPLETE
Area-P 1/2: 3.43 cm2
S' Lateral: 2.7 cm

## 2024-05-05 MED ORDER — POTASSIUM CHLORIDE CRYS ER 10 MEQ PO TBCR: EXTENDED_RELEASE_TABLET | ORAL | 1 refills | Status: DC

## 2024-05-05 NOTE — Telephone Encounter (Signed)
 Prescription Request  05/05/2024  LOV: Visit date not found  What is the name of the medication or equipment? potassium chloride  (KLOR-CON  M) 10 MEQ tablet   Have you contacted your pharmacy to request a refill? Yes   Which pharmacy would you like this sent to?   [facility] [address]     Patient notified that their request is being sent to the clinical staff for review and that they should receive a response within 2 business days.   Please advise at Mobile [phone] (mobile)

## 2024-05-05 NOTE — Telephone Encounter (Signed)
 Spoke with patient who states she is having regular bowel movements daily, she is not taking anything for her bowels. Pt reports this is the third time this has happened in regards to the intense abdominal pain/cramping followed by diarrhea. Advised patient she can try to increase fiber in her diet but if symptoms don't improve, relayed message from Dr. Orvil Bland that patient may need to be evaluated in the ED with possible imaging. Pt states she feels better now and is currently not experiencing any symptoms but if the symptoms return she will go urgent care or ED. Pt thanked the office for calling.

## 2024-05-05 NOTE — Telephone Encounter (Signed)
 Spoke with patient and her grand-daughter [name] who called the office stating "My grandmother [name] woke up at 0600 with stomach pains and diarrhea"  Spoke with patient who states this is the third time this has happened since May 30th. Pt states the pain starts in the  middle of her stomach and she describes it like very intense cramping (like labor pains). Then the diarrhea came.    Pt ate a small brown donut then threw it up. Currently patient states the pain is gone and so is the diarrhea but it may come back?  Pt denies fever, and or chills. Still experiencing light vaginal spotting that is pink. Pt is urinating without difficulties. Pt hasn't started any new medications. Pt also has not taking anything for the cramps or diarrhea.   Pt reports stopping her Nintedanib (OFEV ) for her pulmonary fibrosis 5 days ago because that tends to give her diarrhea and she is worried about being on that medication when she starts her chemotherapy next week.   Advised patient to reach out to Dr. Rayne Callas office about stopping her OFEV .  Advised patient to stay well hydrated and her message will be relayed to providers and the office would call back with recommendations.

## 2024-05-05 NOTE — Telephone Encounter (Signed)
 Has she had bowel function that is not diarrhea? She may need to be evaluated in the ED (with imaging to rule out partial bowel obstruction, colitis, etc)

## 2024-05-05 NOTE — Telephone Encounter (Signed)
 FYI Only or Action Required?: FYI only for provider  Patient is followed in Pulmonology for Idiopathic Pulmonary Fibrosis, last seen on 02/24/2024 by Maire Scot, MD. Called Nurse Triage reporting Information Only. Symptoms began N/A. Interventions attempted: Other: N/A. Symptoms are: N/A.  Triage Disposition: Call PCP When Office is Open  Patient/caregiver understands and will follow disposition?: Yes                      Reason for Disposition  [1] Caller requesting NON-URGENT health information AND [2] PCP's office is the best resource  Answer Assessment - Initial Assessment Questions 1. REASON FOR CALL or QUESTION: "What is your reason for calling today?" or "How can I best help you?" or "What question do you have that I can help answer?"     Patient states that she stopped taking OFEV  Monday 05/01/2024.  She was experiencing some G.I. issues and spoke with Jacki [place] office earlier and they advised her to go to her Cardiology appointment today and then go to Urgent Care afterwards to have her G.I. complaints checked out even thought she is feeling better. They also advised her to let her Pulmonary Office know that she stopped the OREV. She states she had diarrhea and vomiting---that happened this morning around 7 AM.  She states that she is fine now and     Patient was wondering if with her starting chemo & radiation---Would she have been taking off of OFEV ?  Patient also is advised that if anything worsens at all to go straight to the Emergency Room.  Patient verbalized understanding of this.  Protocols used: Information Only Call - No Triage-A-AH

## 2024-05-06 ENCOUNTER — Other Ambulatory Visit: Payer: Self-pay

## 2024-05-06 ENCOUNTER — Ambulatory Visit
Admission: EM | Admit: 2024-05-06 | Discharge: 2024-05-06 | Disposition: A | Discharge status: Home / Self Care | Attending: Nurse Practitioner | Admitting: Nurse Practitioner

## 2024-05-06 ENCOUNTER — Encounter: Payer: Self-pay | Admitting: Emergency Medicine

## 2024-05-06 DIAGNOSIS — R55 Syncope and collapse: Secondary | ICD-10-CM

## 2024-05-06 DIAGNOSIS — R197 Diarrhea, unspecified: Secondary | ICD-10-CM | POA: Diagnosis not present

## 2024-05-06 DIAGNOSIS — R110 Nausea: Secondary | ICD-10-CM | POA: Diagnosis not present

## 2024-05-06 DIAGNOSIS — R1030 Lower abdominal pain, unspecified: Secondary | ICD-10-CM | POA: Diagnosis not present

## 2024-05-06 DIAGNOSIS — R109 Unspecified abdominal pain: Secondary | ICD-10-CM | POA: Diagnosis not present

## 2024-05-06 DIAGNOSIS — Z6832 Body mass index (BMI) 32.0-32.9, adult: Secondary | ICD-10-CM | POA: Diagnosis not present

## 2024-05-06 DIAGNOSIS — E669 Obesity, unspecified: Secondary | ICD-10-CM | POA: Diagnosis not present

## 2024-05-06 DIAGNOSIS — R030 Elevated blood-pressure reading, without diagnosis of hypertension: Secondary | ICD-10-CM | POA: Diagnosis not present

## 2024-05-06 MED ORDER — ONDANSETRON 4 MG PO TBDP: 4.0000 mg | ORAL_TABLET | Freq: Three times a day (TID) | ORAL | 0 refills | Status: DC | PRN

## 2024-05-06 NOTE — ED Triage Notes (Signed)
 Stomach pain and diarrhea that has occurred off and on within the last month.

## 2024-05-06 NOTE — Discharge Instructions (Addendum)
 Take medication as prescribed. Make sure you are drinking plenty of fluids and allowing for plenty of rest. You may take over-the-counter Tylenol as needed for pain, fever, or general discomfort. Recommend foods to add bulk to your stool to help with the diarrhea.  I provided information that you can refer to when you are home. Recommend a bland diet when nausea persist. Go to the emergency department if you experience worsening abdominal pain, nausea, vomiting, fever, or other concerns. Follow-up with your oncologist or primary care physician next week for reevaluation. Follow-up as needed.

## 2024-05-06 NOTE — ED Provider Notes (Signed)
 [facility]    CSN: [id number] Arrival date & time: 05/06/24  1234      History   Chief Complaint Chief Complaint  Patient presents with   Abdominal Pain   Diarrhea    HPI [name] is a 88 y.o. female.   The history is provided by the patient.   Patient presents for complaints of intermittent lower abdominal pain and diarrhea this been present for the past month.  Patient states that the episodes come and go.  She states she has had approximately 3 episodes this month.  She states that the pain comes and can last for up to an hour.  She describes the pain as "cramping."  She states that she has also had diarrhea and nausea over the past week, approximately 2 days ago, states that the symptoms have since improved.  States that she had a chocolate doughnut prior to her symptoms starting.  Patient with underlying history of vaginal cancer.  She states that she is also experienced feelings of "almost passing out" over the past week.  She denies fever, chills, chest pain, constipation, gas, bloating, or bloody stools.  Patient reports that she has had episodes of decreased urine stream and difficulty starting and stopping her urine stream.  Patient reports "since I had that surgery, things are different", referring to her hysterectomy. Past Medical History:  Diagnosis Date   Chronic Leukemia    Depression    H/O hiatal hernia    Hernia    Hernia    right inguinal hernia   Hypertension    Hypothyroidism    Leg swelling    Osteitis pubis (HCC) 05/30/2001   Osteopenia 03/30/2009   Osteopenia 07/30/2022   Bone density 2021 showed elevated FRAX patient was started on Fosamax    Thyroid  disease     Patient Active Problem List   Diagnosis Date Noted   Vaginal cancer (HCC) 04/28/2024   Idiopathic peripheral neuropathy 04/28/2024   Hearing deficit, bilateral 04/28/2024   Cancer associated pain 04/28/2024   Urinary retention 04/28/2024   Postmenopausal vaginal  bleeding 03/29/2024   Urinary incontinence 03/29/2024   Acute cystitis with hematuria 03/08/2024   Bacterial vaginosis 03/08/2024   Chronic diastolic (congestive) heart failure (HCC) 01/28/2023   Lymphoproliferative disease (HCC) 01/28/2023   Idiopathic pulmonary fibrosis (HCC) 08/18/2022   Osteopenia 07/30/2022   Persistent atrial fibrillation (HCC)    Monoclonal B-cell lymphocytosis 01/21/2022   Leukocytosis 09/05/2021   Dyspnea on exertion 05/24/2015   Bilateral leg edema 05/24/2015   Heme positive stool 08/13/2014   GERD (gastroesophageal reflux disease) 07/02/2014   Encounter for screening colonoscopy 07/02/2014   Hyperlipidemia 05/16/2013   Hypothyroidism 05/16/2013   Essential hypertension, benign 05/16/2013   Inguinal hernia, right 04/27/2012    Past Surgical History:  Procedure Laterality Date   ABDOMINAL HYSTERECTOMY  03/1970   APPENDECTOMY  03/1966   BILATERAL SALPINGOOPHORECTOMY     CARDIOVERSION N/A 03/10/2022   Procedure: CARDIOVERSION;  Surgeon: Luana Rumple, MD;  Location: [facility];  Service: Cardiovascular;  Laterality: N/A;   CATARACT EXTRACTION W/PHACO Right 09/25/2014   Procedure: CATARACT EXTRACTION PHACO AND INTRAOCULAR LENS PLACEMENT (IOC);  Surgeon: Lavonia Powers T. Gennie Kicks, MD;  Location: [facility];  Service: Ophthalmology;  Laterality: Right;  CDE:7.56   CATARACT EXTRACTION W/PHACO Left 10/09/2014   Procedure: CATARACT EXTRACTION PHACO AND INTRAOCULAR LENS PLACEMENT LEFT EYE CDE=6.51;  Surgeon: Lavonia Powers T. Gennie Kicks, MD;  Location: [facility];  Service: Ophthalmology;  Laterality: Left;   CHOLECYSTECTOMY  1980  COLONOSCOPY  1999   COLONOSCOPY  March 2007   Repeat 10 years   COLONOSCOPY  11/06/2003    Internal and external hemorrhoids/Two tiny rectal distal sigmoid polyps cold biopsied/Otherwise within normal limits to the cecum   COLONOSCOPY N/A 08/29/2014   Dr. Riley Cheadle: Sessile polyp was found at the cecum status post cold snare polypectomy, tubular adenoma. No further  surveillance unless clinical status changes   ESOPHAGOGASTRODUODENOSCOPY  2007   Dr. Riley Cheadle: normal esophagus, small tom oderate sized hiatal hernia, Cameron lesions   SKIN CANCER EXCISION Left 11 1 2016   left arm squamous cancer   TUMOR REMOVAL  03/1968   ovarian    OB History     Gravida  1   Para  1   Term  1   Preterm      AB      Living  1      SAB      IAB      Ectopic      Multiple      Live Births  1            Home Medications    Prior to Admission medications   Medication Sig Start Date End Date Taking? Authorizing Provider  acetaminophen (TYLENOL) 650 MG CR tablet Take 650 mg by mouth every 8 (eight) hours as needed for pain.    [provider]  albuterol  (VENTOLIN  HFA) 108 (90 Base) MCG/ACT inhaler Inhale 2 puffs into the lungs every 6 (six) hours as needed for wheezing or shortness of breath. 03/08/24   Grooms, Courtney, PA-C  alendronate  (FOSAMAX ) 70 MG tablet TAKE 1 TABLET(70 MG) BY MOUTH EVERY 7 DAYS WITH A FULL GLASS OF WATER  AND ON AN EMPTY STOMACH 08/16/23   Luking, Jackelyn Marvel, MD  ALPRAZolam  (XANAX ) 0.5 MG tablet TAKE 1 TABLET BY MOUTH EVERY NIGHT AT BEDTIME. MAY TAKE 1/2 TABLET DURING THE DAY AS NEEDED 04/04/24   [name], MD  Cholecalciferol (VITAMIN D3) 2000 UNITS TABS Take 4,000 Units by mouth daily.    [provider]  ELIQUIS  5 MG TABS tablet TAKE 1 TABLET(5 MG) BY MOUTH TWICE DAILY. FOLLOW-UP 03/30/24   [name], [name], MD  indapamide  (LOZOL ) 1.25 MG tablet Take 1 tablet (1.25 mg total) by mouth every morning. 02/11/24   [name], MD  levothyroxine  (SYNTHROID ) 125 MCG tablet TAKE 1 TABLET BY MOUTH DAILY 02/14/24   [name], MD  metoprolol  succinate (TOPROL -XL) 25 MG 24 hr tablet TAKE 1 TABLET BY MOUTH DAILY 01/18/24   [name], MD  Multiple Vitamin (MULTI-VITAMIN DAILY PO) Take 1 tablet by mouth daily.    [provider]  Nintedanib (OFEV ) 150 MG CAPS Take 1 capsule (150 mg total) by mouth 2  (two) times daily. 11/02/23   Aleck Hurdle, MD  pantoprazole  (PROTONIX ) 40 MG tablet TAKE 1 TABLET(40 MG) BY MOUTH DAILY 10/21/23   [name], MD  potassium chloride  (KLOR-CON  M) 10 MEQ tablet TAKE 1 TABLET(10 MEQ) BY MOUTH DAILY 05/05/24   [name], MD  sertraline  (ZOLOFT ) 100 MG tablet TAKE 1 TABLET BY MOUTH EVERY DAY 02/21/24   [name], MD    Family History Family History  Problem Relation Age of Onset   Dementia Mother    Heart disease Father    Cancer Father        throat   Cancer Sister        breast   Colon  cancer Neg Hx    Lung disease Neg Hx    Autoimmune disease Neg Hx     Social History Social History   Tobacco Use   Smoking status: Never   Smokeless tobacco: Never   Tobacco comments:    Never smoked  Vaping Use   Vaping status: Never Used  Substance Use Topics   Alcohol use: Yes    Alcohol/week: 0.0 standard drinks of alcohol    Comment: occasional margarita   Drug use: No     Allergies   Erythromycin, Aspirin, and Ibuprofen   Review of Systems Review of Systems Per HPI  Physical Exam Triage Vital Signs ED Triage Vitals [05/06/24 1242]  Encounter Vitals Group     BP 129/83     Systolic BP Percentile      Diastolic BP Percentile      Pulse Rate 99     Resp 18     Temp 97.9 F (36.6 C)     Temp Source Oral     SpO2 95 %     Weight      Height      Head Circumference      Peak Flow      Pain Score 0     Pain Loc      Pain Education      Exclude from Growth Chart    No data found.  Updated Vital Signs BP 129/83 (BP Location: Right Arm)   Pulse 99   Temp 97.9 F (36.6 C) (Oral)   Resp 18   SpO2 95%   Visual Acuity Right Eye Distance:   Left Eye Distance:   Bilateral Distance:    Right Eye Near:   Left Eye Near:    Bilateral Near:     Physical Exam Vitals and nursing note reviewed.  Constitutional:      General: She is not in acute distress.    Appearance: Normal appearance.  HENT:     Head:  Normocephalic.     Mouth/Throat:     Mouth: Mucous membranes are moist.  Eyes:     Extraocular Movements: Extraocular movements intact.     Pupils: Pupils are equal, round, and reactive to light.  Cardiovascular:     Rate and Rhythm: Regular rhythm.     Pulses: Normal pulses.     Heart sounds: Normal heart sounds.  Pulmonary:     Effort: Pulmonary effort is normal.     Breath sounds: Normal breath sounds.  Abdominal:     General: Bowel sounds are normal. There is no distension.     Palpations: Abdomen is soft.     Tenderness: There is no abdominal tenderness. There is no right CVA tenderness, left CVA tenderness, guarding or rebound.  Musculoskeletal:     Cervical back: Normal range of motion.  Skin:    General: Skin is warm and dry.  Neurological:     General: No focal deficit present.     Mental Status: She is alert and oriented to person, place, and time.  Psychiatric:        Mood and Affect: Mood normal.        Behavior: Behavior normal.      UC Treatments / Results  Labs (all labs ordered are listed, but only abnormal results are displayed) Labs Reviewed - No data to display  EKG: Atrial fibrillation with RBBB, no STEMI. Compared to EKGs performed on 04/04/2024, 07/14/2023, 10/09/2022, and 03/24/2022.   Radiology   Procedures  Procedures (including critical care time)  Medications Ordered in UC Medications - No data to display  Initial Impression / Assessment and Plan / UC Course  I have reviewed the triage vital signs and the nursing notes.  Pertinent labs & imaging results that were available during my care of the patient were reviewed by me and considered in my medical decision making (see chart for details).  Patient with intermittent abdominal pain for the past month and near syncope.  *** Final Clinical Impressions(s) / UC Diagnoses   Final diagnoses:  None   Discharge Instructions   None    ED Prescriptions   None    PDMP not reviewed this  encounter.

## 2024-05-06 NOTE — ED Notes (Signed)
 Pt unable to provide urine specimen.

## 2024-05-06 NOTE — ED Notes (Signed)
 Has been drinking water  to try and obtain a urine specimen

## 2024-05-07 ENCOUNTER — Other Ambulatory Visit: Payer: Self-pay

## 2024-05-08 ENCOUNTER — Telehealth: Payer: Self-pay | Admitting: Oncology

## 2024-05-08 ENCOUNTER — Other Ambulatory Visit: Payer: Self-pay | Admitting: Hematology and Oncology

## 2024-05-08 ENCOUNTER — Other Ambulatory Visit: Payer: Self-pay | Admitting: Oncology

## 2024-05-08 ENCOUNTER — Ambulatory Visit: Payer: Self-pay | Admitting: Emergency Medicine

## 2024-05-08 NOTE — Telephone Encounter (Signed)
 Called and left a message about EUA and cystoscopy on 05/10/24.  Requested a return call to discuss.

## 2024-05-08 NOTE — Progress Notes (Signed)
 GYN Location of Tumor / Histology: {Blank single:19197::"Endometrial","Cervical","Uterine"}  Clifton Dames presented with symptoms of: bleeding  Biopsies revealed: ***  Past/Anticipated interventions by Gyn/Onc surgery, if any: ***  Past/Anticipated interventions by medical oncology, if any:    Weight changes, if any: {:18581}  Bowel/Bladder complaints, if any: {yes no:314532}, {Blank single:19197::"diarrhea","constipation","urinary frequency","burning","trouble emptying bladder"," "}  Nausea/Vomiting, if any: {:18581}  Pain issues, if any:  {:18581}  SAFETY ISSUES: Prior radiation? {:18581} Pacemaker/ICD? {:18581} Possible current pregnancy? {:18581} Is the patient on methotrexate? {:18581}  Current Complaints / other details:  ***

## 2024-05-08 NOTE — Progress Notes (Signed)
 Gynecologic Oncology Multi-Disciplinary Disposition Conference Note  Date of the Conference: 05/08/2024  Patient Name: [name]  Referring Provider: Dr. Randolm Butte Primary GYN Oncologist: Dr. 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 Disposition is to concurrent chemoradiation with consideration for immunotherapy. Also recommendation for cystoscopy and interstitial radiation therapy.   This Multidisciplinary conference took place involving physicians from Gynecologic Oncology, Medical Oncology, Radiation Oncology, Pathology, Radiology along with the Gynecologic Oncology Nurse Practitioner and Gynecologic Oncology Nurse Navigator.  Comprehensive assessment of the patient's malignancy, staging, need for surgery, chemotherapy, radiation therapy, and need for further testing were reviewed. Supportive measures, both inpatient and following discharge were also discussed. The recommended plan of care is documented. Greater than 35 minutes were spent correlating and coordinating this patient's care.

## 2024-05-08 NOTE — Telephone Encounter (Signed)
 Called [name] again and discussed that Dr. Orvil Bland would like to do an exam under anesthesia and cystoscopy on 05/10/24.  [name] said she is going to have oral surgery tomorrow and is wondering if that would be ok.  Advised her that we checked with Dr. Orvil Bland and it is ok to have both procedures but that we can schedule for 05/17/24 if that works better. [name] said she would like to schedule for 05/17/24.  Reviewed her other appointments for port placement on 05/16/24.  Also gave her my number to call in case she has any questions.

## 2024-05-09 ENCOUNTER — Other Ambulatory Visit: Payer: Self-pay | Admitting: Hematology and Oncology

## 2024-05-09 DIAGNOSIS — C52 Malignant neoplasm of vagina: Secondary | ICD-10-CM

## 2024-05-09 NOTE — Progress Notes (Signed)
 Radiation Oncology         [phone] ________________________________  Initial {In/Out}patient Consultation  Name: [name] MRN: [id number]  Date: 05/10/2024  DOB: 12-Mar-1936  GN:FAOZHY, Jackelyn Marvel, MD  Suzi Essex, MD   REFERRING PHYSICIAN: Suzi Essex, MD  DIAGNOSIS: There were no encounter diagnoses.  Vaginal cancer (HCC) FIGO Stage IVA (cT4, cN1, cM0)  HISTORY OF PRESENT ILLNESS::[name] is a 88 y.o. female who is accompanied by ***. she is seen as a courtesy of Dr. Orvil Bland for an opinion concerning radiation therapy as part of management for her recently diagnosed vaginal cancer. Patient has a history of removal of 1 tube and ovary for an adnexal mass along with the removal of the contralateral tube and a hysterectomy.      The patient presented to her [facility] on 04/05/24 with complains of post menstrual bleeding in the setting of a prior hysterectomy that had persisted for the prior month. To further investigate her symptoms, she underwent a vaginal cuff biopsy under the care of Dr. Randolm Butte. Pathology revealed invasive moderate to poorly differentiated squamous cell carcinoma, focally keratinizing with tumor present in all margins.   For further staging purposes she underwent a PET scan on 04/07/24 showing hypermetabolic lesion along the upper third of the vaginal cuff posterior to the bladder compatible with malignancy. 8 mm right external iliac lymph node with an SUV of 6.6, compatible with metastatic disease. Patient also underwent a pelvic MRI on the same date revealing a concentric mass involving upper portion of the vaginal cuff with bilateral parametrial invasion. Mild irregular bladder wall thickening involving bladder base, contiguous with the vaginal soft tissue mass, concerning for direct bladder invasion.   Subsequently, she was referred to Dr. Orvil Bland on 04/28/24. During her visit, patient reported associated symptoms of bladder fullness, intermittent pelvic  discomfort, and a sensation of incomplete bladder emptying and incontinence.  Patient was then referred to Dr. Marton Sleeper on 04/28/24 to discuss further treatment plan. Upon discussion, she recommend concurrent chemoradiation therapy carboplatin.   Patient case was presented to the tumor board on 05/08/24 with the disposition to concurrent chemoradiation with consideration for immunotherapy. Also recommendation for cystoscopy and interstitial radiation therapy.        PREVIOUS RADIATION THERAPY: No  PAST MEDICAL HISTORY:  Past Medical History:  Diagnosis Date   Chronic Leukemia    Depression    H/O hiatal hernia    Hernia    Hernia    right inguinal hernia   Hypertension    Hypothyroidism    Leg swelling    Osteitis pubis (HCC) 05/30/2001   Osteopenia 03/30/2009   Osteopenia 07/30/2022   Bone density 2021 showed elevated FRAX patient was started on Fosamax    Thyroid  disease     PAST SURGICAL HISTORY: Past Surgical History:  Procedure Laterality Date   ABDOMINAL HYSTERECTOMY  03/1970   APPENDECTOMY  03/1966   BILATERAL SALPINGOOPHORECTOMY     CARDIOVERSION N/A 03/10/2022   Procedure: CARDIOVERSION;  Surgeon: Luana Rumple, MD;  Location: [facility];  Service: Cardiovascular;  Laterality: N/A;   CATARACT EXTRACTION W/PHACO Right 09/25/2014   Procedure: CATARACT EXTRACTION PHACO AND INTRAOCULAR LENS PLACEMENT (IOC);  Surgeon: Lavonia Powers T. Gennie Kicks, MD;  Location: [facility];  Service: Ophthalmology;  Laterality: Right;  CDE:7.56   CATARACT EXTRACTION W/PHACO Left 10/09/2014   Procedure: CATARACT EXTRACTION PHACO AND INTRAOCULAR LENS PLACEMENT LEFT EYE CDE=6.51;  Surgeon: Lavonia Powers T. Gennie Kicks, MD;  Location: [facility];  Service: Ophthalmology;  Laterality:  Left;   CHOLECYSTECTOMY  1980   COLONOSCOPY  1999   COLONOSCOPY  March 2007   Repeat 10 years   COLONOSCOPY  11/06/2003    Internal and external hemorrhoids/Two tiny rectal distal sigmoid polyps cold biopsied/Otherwise within normal limits to the  cecum   COLONOSCOPY N/A 08/29/2014   Dr. Riley Cheadle: Sessile polyp was found at the cecum status post cold snare polypectomy, tubular adenoma. No further surveillance unless clinical status changes   ESOPHAGOGASTRODUODENOSCOPY  2007   Dr. Riley Cheadle: normal esophagus, small tom oderate sized hiatal hernia, Cameron lesions   SKIN CANCER EXCISION Left 11 1 2016   left arm squamous cancer   TUMOR REMOVAL  03/1968   ovarian    FAMILY HISTORY:  Family History  Problem Relation Age of Onset   Dementia Mother    Heart disease Father    Cancer Father        throat   Cancer Sister        breast   Colon cancer Neg Hx    Lung disease Neg Hx    Autoimmune disease Neg Hx     SOCIAL HISTORY:  Social History   Tobacco Use   Smoking status: Never   Smokeless tobacco: Never   Tobacco comments:    Never smoked  Vaping Use   Vaping status: Never Used  Substance Use Topics   Alcohol use: Yes    Alcohol/week: 0.0 standard drinks of alcohol    Comment: occasional margarita   Drug use: No    ALLERGIES:  Allergies  Allergen Reactions   Erythromycin Shortness Of Breath   Aspirin Other (See Comments)    Caused internal bleeding. Patient had ulcer at the time. Instructed by PCP - Candance Certain not to take any longer.   Ibuprofen     Instructed not to take by PCP - Candance Certain due to reaction to aspirin as well (precautionary).    MEDICATIONS:  Current Outpatient Medications  Medication Sig Dispense Refill   acetaminophen (TYLENOL) 650 MG CR tablet Take 650 mg by mouth every 8 (eight) hours as needed for pain.     albuterol  (VENTOLIN  HFA) 108 (90 Base) MCG/ACT inhaler Inhale 2 puffs into the lungs every 6 (six) hours as needed for wheezing or shortness of breath. 18 g 1   alendronate  (FOSAMAX ) 70 MG tablet TAKE 1 TABLET(70 MG) BY MOUTH EVERY 7 DAYS WITH A FULL GLASS OF WATER  AND ON AN EMPTY STOMACH 4 tablet 3   ALPRAZolam  (XANAX ) 0.5 MG tablet TAKE 1 TABLET BY MOUTH EVERY NIGHT AT BEDTIME. MAY TAKE 1/2  TABLET DURING THE DAY AS NEEDED 45 tablet 4   Cholecalciferol (VITAMIN D3) 2000 UNITS TABS Take 2,000 Units by mouth daily.     ELIQUIS  5 MG TABS tablet TAKE 1 TABLET(5 MG) BY MOUTH TWICE DAILY. FOLLOW-UP 180 tablet 1   indapamide  (LOZOL ) 1.25 MG tablet Take 1 tablet (1.25 mg total) by mouth every morning. 90 tablet 0   levothyroxine  (SYNTHROID ) 125 MCG tablet TAKE 1 TABLET BY MOUTH DAILY 90 tablet 1   metoprolol  succinate (TOPROL -XL) 25 MG 24 hr tablet TAKE 1 TABLET BY MOUTH DAILY 90 tablet 1   Multiple Vitamin (MULTI-VITAMIN DAILY PO) Take 1 tablet by mouth daily.     Nintedanib (OFEV ) 150 MG CAPS Take 1 capsule (150 mg total) by mouth 2 (two) times daily. 180 capsule 1   ondansetron  (ZOFRAN -ODT) 4 MG disintegrating tablet Take 1 tablet (4 mg total) by mouth every 8 (eight) hours as  needed. 20 tablet 0   pantoprazole  (PROTONIX ) 40 MG tablet TAKE 1 TABLET(40 MG) BY MOUTH DAILY 90 tablet 1   potassium chloride  (KLOR-CON  M) 10 MEQ tablet TAKE 1 TABLET(10 MEQ) BY MOUTH DAILY 90 tablet 1   sertraline  (ZOLOFT ) 100 MG tablet TAKE 1 TABLET BY MOUTH EVERY DAY 90 tablet 1   No current facility-administered medications for this encounter.    REVIEW OF SYSTEMS:  A 10+ POINT REVIEW OF SYSTEMS WAS OBTAINED including neurology, dermatology, psychiatry, cardiac, respiratory, lymph, extremities, GI, GU, musculoskeletal, constitutional, reproductive, HEENT. ***   PHYSICAL EXAM:  vitals were not taken for this visit.   General: Alert and oriented, in no acute distress HEENT: Head is normocephalic. Extraocular movements are intact. Oropharynx is clear. Neck: Neck is supple, no palpable cervical or supraclavicular lymphadenopathy. Heart: Regular in rate and rhythm with no murmurs, rubs, or gallops. Chest: Clear to auscultation bilaterally, with no rhonchi, wheezes, or rales. Abdomen: Soft, nontender, nondistended, with no rigidity or guarding. Extremities: No cyanosis or edema. Lymphatics: see Neck  Exam Skin: No concerning lesions. Musculoskeletal: symmetric strength and muscle tone throughout. Neurologic: Cranial nerves II through XII are grossly intact. No obvious focalities. Speech is fluent. Coordination is intact. Psychiatric: Judgment and insight are intact. Affect is appropriate. ***  ECOG = ***  0 - Asymptomatic (Fully active, able to carry on all predisease activities without restriction)  1 - Symptomatic but completely ambulatory (Restricted in physically strenuous activity but ambulatory and able to carry out work of a light or sedentary nature. For example, light housework, office work)  2 - Symptomatic, <50% in bed during the day (Ambulatory and capable of all self care but unable to carry out any work activities. Up and about more than 50% of waking hours)  3 - Symptomatic, >50% in bed, but not bedbound (Capable of only limited self-care, confined to bed or chair 50% or more of waking hours)  4 - Bedbound (Completely disabled. Cannot carry on any self-care. Totally confined to bed or chair)  5 - Death   Aurea Blossom MM, Creech RH, Tormey DC, et al. [phone]). "Toxicity and response criteria of the [facility]". Am. Hillard Lowes. Oncol. 5 (6): 649-55  LABORATORY DATA:  Lab Results  Component Value Date   WBC 14.9 (H) 04/28/2024   HGB 12.8 04/28/2024   HCT 38.0 04/28/2024   MCV 91.3 04/28/2024   PLT 383 04/28/2024   NEUTROABS 4.4 12/09/2023   Lab Results  Component Value Date   NA 133 (L) 04/28/2024   K 3.6 04/28/2024   CL 94 (L) 04/28/2024   CO2 29 04/28/2024   GLUCOSE 118 (H) 04/28/2024   BUN 16 04/28/2024   CREATININE 0.99 04/28/2024   CALCIUM 8.9 04/28/2024      RADIOGRAPHY: ECHOCARDIOGRAM COMPLETE Result Date: 05/05/2024    ECHOCARDIOGRAM REPORT   Patient Name:   JONAI WEYLAND Date of Exam: 05/05/2024 Medical Rec #:  [id number]       Height:       65.5 in Accession #:    [id number]      Weight:       191.6 lb Date of Birth:  [birth year]        BSA:          1.953 m Patient Age:    88 years        BP:           138/72 mmHg Patient Gender: F  HR:           99 bpm. Exam Location:  [address] Procedure: 2D Echo, Cardiac Doppler and Color Doppler (Both Spectral and Color            Flow Doppler were utilized during procedure). Indications:    I50.32 CHF  History:        Patient has prior history of Echocardiogram examinations, most                 recent 02/10/2022. CHF, CAD, Pulmonary HTN, Arrythmias:Atrial                 Fibrillation; Risk Factors:Dyslipidemia and Hypertension.  Sonographer:    Lula Sale RDCS Referring Phys: [id number] [place] [facility] [place] IMPRESSIONS  1. Left ventricular ejection fraction, by estimation, is 55 to 60%. The left ventricle has normal function. The left ventricle has no regional wall motion abnormalities. There is mild concentric left ventricular hypertrophy. Left ventricular diastolic function could not be evaluated.  2. Right ventricular systolic function is mildly reduced. The right ventricular size is mildly enlarged. There is normal pulmonary artery systolic pressure. The estimated right ventricular systolic pressure is 35.9 mmHg.  3. Left atrial size was severely dilated.  4. Right atrial size was severely dilated.  5. The mitral valve is abnormal. Mild mitral valve regurgitation. No evidence of mitral stenosis.  6. Tricuspid valve regurgitation is mild to moderate.  7. The aortic valve is tricuspid. There is mild calcification of the aortic valve. Aortic valve regurgitation is not visualized. Aortic valve sclerosis/calcification is present, without any evidence of aortic stenosis.  8. The inferior vena cava is normal in size with greater than 50% respiratory variability, suggesting right atrial pressure of 3 mmHg. FINDINGS  Left Ventricle: Left ventricular ejection fraction, by estimation, is 55 to 60%. The left ventricle has normal function. The left ventricle has no regional wall motion abnormalities.  The left ventricular internal cavity size was normal in size. There is  mild concentric left ventricular hypertrophy. Left ventricular diastolic function could not be evaluated due to atrial fibrillation. Left ventricular diastolic function could not be evaluated. Right Ventricle: The right ventricular size is mildly enlarged. No increase in right ventricular wall thickness. Right ventricular systolic function is mildly reduced. There is normal pulmonary artery systolic pressure. The tricuspid regurgitant velocity  is 2.87 m/s, and with an assumed right atrial pressure of 3 mmHg, the estimated right ventricular systolic pressure is 35.9 mmHg. Left Atrium: Left atrial size was severely dilated. Right Atrium: Right atrial size was severely dilated. Pericardium: There is no evidence of pericardial effusion. Mitral Valve: The mitral valve is abnormal. Mild mitral annular calcification. Mild mitral valve regurgitation. No evidence of mitral valve stenosis. Tricuspid Valve: The tricuspid valve is normal in structure. Tricuspid valve regurgitation is mild to moderate. No evidence of tricuspid stenosis. Aortic Valve: The aortic valve is tricuspid. There is mild calcification of the aortic valve. Aortic valve regurgitation is not visualized. Aortic valve sclerosis/calcification is present, without any evidence of aortic stenosis. Pulmonic Valve: The pulmonic valve was normal in structure. Pulmonic valve regurgitation is mild. No evidence of pulmonic stenosis. Aorta: The aortic root is normal in size and structure. Venous: The inferior vena cava is normal in size with greater than 50% respiratory variability, suggesting right atrial pressure of 3 mmHg. IAS/Shunts: No atrial level shunt detected by color flow Doppler.  LEFT VENTRICLE PLAX 2D LVIDd:         3.90 cm  Diastology LVIDs:         2.70 cm   LV e' medial:    6.02 cm/s LV PW:         1.10 cm   LV E/e' medial:  16.6 LV IVS:        1.20 cm   LV e' lateral:   8.27 cm/s  LVOT diam:     1.70 cm   LV E/e' lateral: 12.0 LV SV:         35 LV SV Index:   18 LVOT Area:     2.27 cm  RIGHT VENTRICLE            IVC RVSP:           35.9 mmHg  IVC diam: 1.00 cm LEFT ATRIUM              Index        RIGHT ATRIUM           Index LA diam:        4.70 cm  2.41 cm/m   RA Pressure: 3.00 mmHg LA Vol (A2C):   64.9 ml  33.22 ml/m  RA Area:     21.10 cm LA Vol (A4C):   100.0 ml 51.19 ml/m  RA Volume:   57.10 ml  29.23 ml/m LA Biplane Vol: 85.1 ml  43.56 ml/m  AORTIC VALVE LVOT Vmax:   82.70 cm/s LVOT Vmean:  57.640 cm/s LVOT VTI:    0.154 m  AORTA Ao Root diam: 3.20 cm Ao Asc diam:  2.90 cm MITRAL VALVE               TRICUSPID VALVE MV Area (PHT): 3.43 cm    TR Peak grad:   32.9 mmHg MV Decel Time: 221 msec    TR Vmax:        287.00 cm/s MV E velocity: 99.62 cm/s  Estimated RAP:  3.00 mmHg                            RVSP:           35.9 mmHg                             SHUNTS                            Systemic VTI:  0.15 m                            Systemic Diam: 1.70 cm Jules Oar MD Electronically signed by Jules Oar MD Signature Date/Time: 05/05/2024/8:10:46 PM    Final    NM PET Image Initial (PI) Whole Body (F-18 FDG) Result Date: 04/26/2024 CLINICAL DATA:  Initial treatment strategy for vaginal cancer. EXAM: NUCLEAR MEDICINE PET WHOLE BODY TECHNIQUE: 9.4 mCi F-18 FDG was injected intravenously. Full-ring PET imaging was performed from the head to foot after the radiotracer. CT data was obtained and used for attenuation correction and anatomic localization. Fasting blood glucose: 105 mg/dl COMPARISON:  Pelvic MRI 04/20/2024 FINDINGS: Mediastinal blood pool activity: SUV max 3.0 HEAD/NECK: No significant abnormal hypermetabolic activity in this region. Incidental CT findings: none CHEST: No significant abnormal hypermetabolic activity in this region. Incidental CT findings: Aortic and branch vessel atherosclerosis. Moderate cardiomegaly. Mild nonspecific peripheral  interstitial  accentuation in the lungs, cannot exclude mild interstitial edema. ABDOMEN/PELVIS: Posterior to the urinary bladder along the upper third of the vaginal cuff, a hypermetabolic lesion has maximum SUV of 25.5, compatible with malignancy. A right external iliac node measuring 0.8 cm in short axis on image 184 series 4 has maximum SUV of 6.6, compatible with malignant involvement. Along the anterior margin of an otherwise simple right mid to lower kidney cyst, a 1.2 by 2.3 cm higher density component is present on image 137 of series 4. This could simply reflect blood products of a complex cyst, but I can confidently exclude low-grade metabolic activity. Dedicated renal protocol MRI with and without contrast is recommended for definitive characterization. Incidental CT findings: Hiatal hernia with adjacent postoperative findings under is metal clips in the left upper quadrant. Atherosclerosis is present, including aortoiliac atherosclerotic disease. Right inguinal hernia contains adipose tissue and a loop of small bowel without findings of strangulation or obstruction. SKELETON: No significant abnormal hypermetabolic activity in this region. Incidental CT findings: none EXTREMITIES: No significant abnormal hypermetabolic activity in this region. Incidental CT findings: Incidental fatty components of the medial head gastrocnemius muscle on the right. IMPRESSION: 1. Hypermetabolic lesion along the upper third of the vaginal cuff posterior to the urinary bladder, compatible with malignancy. 2. A 0.8 cm right external iliac node has maximum SUV of 6.6, compatible with malignant involvement. 3. Along the anterior margin of an otherwise simple right mid to lower kidney cyst, a 1.2 by 2.3 cm higher density component is present. This could simply reflect blood products of a complex cyst, but I can confidently exclude low-grade metabolic activity. Dedicated renal protocol MRI with and without contrast is recommended  for definitive characterization, to exclude synchronous renal mass. 4. Right inguinal hernia contains adipose tissue and a loop of small bowel without findings of strangulation or obstruction. 5. Hiatal hernia with adjacent postoperative findings under is metal clips in the left upper quadrant. 6. Moderate cardiomegaly. Mild nonspecific peripheral interstitial accentuation in the lungs, cannot exclude mild interstitial edema. 7.  Aortic Atherosclerosis (ICD10-I70.0). Electronically Signed   By: Freida Jes M.D.   On: 04/26/2024 21:03   MR PELVIS W WO CONTRAST Result Date: 04/26/2024 CLINICAL DATA:  Vaginal carcinoma. EXAM: MRI PELVIS WITHOUT AND WITH CONTRAST TECHNIQUE: Multiplanar multisequence MR imaging of the pelvis was performed both before and after administration of intravenous contrast. CONTRAST:  9mL GADAVIST  GADOBUTROL  1 MMOL/ML IV SOLN COMPARISON:  None Available. FINDINGS: Lower Urinary Tract: Mild irregular wall thickening is seen involving the bladder base, which is contiguous with the vaginal soft tissue mass described below, suspicious for direct bladder invasion. Bowel: Unremarkable pelvic bowel loops. Vascular/Lymphatic: 9 mm right external iliac lymph node is seen on image 16/3, which is borderline enlarged. No pathologically enlarged lymph nodes identified. Reproductive: -- Uterus: Prior hysterectomy. Concentric masslike wall thickening is seen involving the upper portion of the vaginal cuff, measuring approximately 3.8 x 2.6 x 3.5 cm. Irregular contour is seen with the adjacent parametrial fat bilaterally, consistent with parametrial invasion. No evidence of distal ureteral involvement or hydroureteronephrosis. -- Right ovary: Not visualized, however no adnexal mass identified. -- Left ovary:  Not visualized, however no adnexal mass identified. Other: No peritoneal thickening or abnormal free fluid. Moderate right inguinal hernia is seen which contains small bowel. No evidence of bowel  obstruction or strangulation. Musculoskeletal:  Unremarkable. IMPRESSION: Concentric mass involving the upper portion of the vaginal cuff, with evidence of bilateral parametrial invasion. Mild irregular bladder wall  thickening involving the bladder base, which is contiguous with the vaginal soft tissue mass, highly suspicious for direct bladder invasion. Borderline enlarged 9 mm right external iliac lymph node. Consider PET-CT scan for further evaluation. Moderate right inguinal hernia containing small bowel. No evidence of bowel obstruction or strangulation. Electronically Signed   By: Marlyce Sine M.D.   On: 04/26/2024 16:08      IMPRESSION: Vaginal cancer (HCC) FIGO Stage IVA (cT4, cN1, cM0)  ***  Today, I talked to the patient and family about the findings and work-up thus far.  We discussed the natural history of *** and general treatment, highlighting the role of radiotherapy in the management.  We discussed the available radiation techniques, and focused on the details of logistics and delivery.  We reviewed the anticipated acute and late sequelae associated with radiation in this setting.  The patient was encouraged to ask questions that I answered to the best of my ability. *** A patient consent form was discussed and signed.  We retained a copy for our records.  The patient would like to proceed with radiation and will be scheduled for CT simulation.  PLAN: ***    *** minutes of total time was spent for this patient encounter, including preparation, face-to-face counseling with the patient and coordination of care, physical exam, and documentation of the encounter.   ------------------------------------------------  Noralee Beam, PhD, MD  This document serves as a record of services personally performed by Retta Caster, MD. It was created on his behalf by Lucky Sable, a trained medical scribe. The creation of this record is based on the scribe's personal observations and the provider's  statements to them. This document has been checked and approved by the attending provider.

## 2024-05-10 ENCOUNTER — Ambulatory Visit
Admission: RE | Admit: 2024-05-10 | Discharge: 2024-05-10 | Disposition: A | Discharge status: Home / Self Care | Source: Ambulatory Visit | Attending: Radiation Oncology | Admitting: Radiation Oncology

## 2024-05-10 ENCOUNTER — Ambulatory Visit (HOSPITAL_COMMUNITY)

## 2024-05-10 ENCOUNTER — Ambulatory Visit: Payer: Self-pay | Admitting: Internal Medicine

## 2024-05-10 ENCOUNTER — Telehealth: Payer: Self-pay | Admitting: Cardiovascular Disease

## 2024-05-10 ENCOUNTER — Encounter: Payer: Self-pay | Admitting: Radiation Oncology

## 2024-05-10 VITALS — BP 143/81 | HR 75 | Temp 97.3°F | Resp 20 | Ht 65.5 in | Wt 191.4 lb

## 2024-05-10 DIAGNOSIS — Z79890 Hormone replacement therapy (postmenopausal): Secondary | ICD-10-CM | POA: Insufficient documentation

## 2024-05-10 DIAGNOSIS — J84112 Idiopathic pulmonary fibrosis: Secondary | ICD-10-CM | POA: Diagnosis not present

## 2024-05-10 DIAGNOSIS — E079 Disorder of thyroid, unspecified: Secondary | ICD-10-CM | POA: Diagnosis not present

## 2024-05-10 DIAGNOSIS — Z923 Personal history of irradiation: Secondary | ICD-10-CM | POA: Insufficient documentation

## 2024-05-10 DIAGNOSIS — M8580 Other specified disorders of bone density and structure, unspecified site: Secondary | ICD-10-CM | POA: Insufficient documentation

## 2024-05-10 DIAGNOSIS — I272 Pulmonary hypertension, unspecified: Secondary | ICD-10-CM | POA: Diagnosis not present

## 2024-05-10 DIAGNOSIS — Z90710 Acquired absence of both cervix and uterus: Secondary | ICD-10-CM | POA: Insufficient documentation

## 2024-05-10 DIAGNOSIS — C9110 Chronic lymphocytic leukemia of B-cell type not having achieved remission: Secondary | ICD-10-CM | POA: Insufficient documentation

## 2024-05-10 DIAGNOSIS — I119 Hypertensive heart disease without heart failure: Secondary | ICD-10-CM | POA: Insufficient documentation

## 2024-05-10 DIAGNOSIS — K449 Diaphragmatic hernia without obstruction or gangrene: Secondary | ICD-10-CM | POA: Insufficient documentation

## 2024-05-10 DIAGNOSIS — Z79899 Other long term (current) drug therapy: Secondary | ICD-10-CM | POA: Insufficient documentation

## 2024-05-10 DIAGNOSIS — Z801 Family history of malignant neoplasm of trachea, bronchus and lung: Secondary | ICD-10-CM | POA: Diagnosis not present

## 2024-05-10 DIAGNOSIS — I700 Atherosclerosis of aorta: Secondary | ICD-10-CM | POA: Diagnosis not present

## 2024-05-10 DIAGNOSIS — C52 Malignant neoplasm of vagina: Secondary | ICD-10-CM

## 2024-05-10 DIAGNOSIS — E039 Hypothyroidism, unspecified: Secondary | ICD-10-CM | POA: Diagnosis not present

## 2024-05-10 DIAGNOSIS — K4090 Unilateral inguinal hernia, without obstruction or gangrene, not specified as recurrent: Secondary | ICD-10-CM | POA: Insufficient documentation

## 2024-05-10 DIAGNOSIS — Z9221 Personal history of antineoplastic chemotherapy: Secondary | ICD-10-CM | POA: Diagnosis not present

## 2024-05-10 DIAGNOSIS — Z803 Family history of malignant neoplasm of breast: Secondary | ICD-10-CM | POA: Insufficient documentation

## 2024-05-10 DIAGNOSIS — Z7901 Long term (current) use of anticoagulants: Secondary | ICD-10-CM | POA: Diagnosis not present

## 2024-05-10 DIAGNOSIS — I10 Essential (primary) hypertension: Secondary | ICD-10-CM | POA: Insufficient documentation

## 2024-05-10 NOTE — Telephone Encounter (Signed)
 Called and spoke with patient, discussed echo results per Palmer Bobo, NP:  Echocardiogram shows normal pumping function of the heart at 55 to 60%.  There are no regional wall motion abnormalities to suggest prior heart damage.  Your pulmonary artery systolic pressure (pulmonary hypertension) is now normal, which has improved from prior echocardiogram in 2023.  There is mild mitral valve regurgitation and mild to moderate tricuspid valve regurgitation.  Overall reassuring results.  Continue current medication regimen and follow-up as planned.     Patient verbalized understanding and expressed appreciation for call.

## 2024-05-10 NOTE — Telephone Encounter (Signed)
Patient returned staff call regarding echocardiogram test results.

## 2024-05-10 NOTE — Telephone Encounter (Signed)
 Called and spoke with patient, she is getting more sob and will be starting chemo and radiation soon.  She was requesting to be seen to see if she qualifies for oxygen before her treatment starts.  Advised we could see her on 05/25/24 at 3:15 pm and she would need to arrive by 3:00 pm for check in.  She said her granddaughter had her book with appointments, but she did not think she had anything that day.  I let her know to call us  back if this does not work and we can look for another time.  She verbalized understanding.  Nothing further needed.

## 2024-05-10 NOTE — Telephone Encounter (Signed)
  FYI Only or Action Required?: Action required by provider  Patient is followed in Pulmonology for Pulmonary Fibrosis, last seen on 02/24/2024 by Maire Scot, MD. Called Nurse Triage reporting Shortness of Breath. Symptoms began several weeks ago. Interventions attempted: Rescue inhaler and Increased fluids/rest. Symptoms are: unchanged.  Triage Disposition: See PCP When Office is Open (Within 3 Days)  Patient/caregiver understands and will follow disposition?: No, wishes to speak with PCP Copied from CRM [id number]. Topic: Clinical - Red Word Triage >> May 10, 2024 12:35 PM Whitney O wrote: [facility] that prompted transfer to Nurse Triage: patient is having trouble breathing and about to start chemo . Patient sees dr Olen Bern Reason for Disposition  [1] MODERATE longstanding difficulty breathing (e.g., speaks in phrases, SOB even at rest, pulse 100-120) AND [2] SAME as normal  Answer Assessment - Initial Assessment Questions 1. RESPIRATORY STATUS: Describe your breathing? (e.g., wheezing, shortness of breath, unable to speak, severe coughing)      Shortness of breath 2. ONSET: When did this breathing problem begin?      Worsened over the last couple of weeks 3. PATTERN Does the difficult breathing come and go, or has it been constant since it started?      constant 4. SEVERITY: How bad is your breathing? (e.g., mild, moderate, severe)    - MILD: No SOB at rest, mild SOB with walking, speaks normally in sentences, can lie down, no retractions, pulse < 100.    - MODERATE: SOB at rest, SOB with minimal exertion and prefers to sit, cannot lie down flat, speaks in phrases, mild retractions, audible wheezing, pulse 100-120.    - SEVERE: Very SOB at rest, speaks in single words, struggling to breathe, sitting hunched forward, retractions, pulse > 120      Mild-worsens with movement 5. RECURRENT SYMPTOM: Have you had difficulty breathing before? If Yes, ask: When was the last  time? and What happened that time?      Yes- 6. CARDIAC HISTORY: Do you have any history of heart disease? (e.g., heart attack, angina, bypass surgery, angioplasty)      no 7. LUNG HISTORY: Do you have any history of lung disease?  (e.g., pulmonary embolus, asthma, emphysema)     Pulmonary fibrosis, pulmonary hypertension 8. CAUSE: What do you think is causing the breathing problem?      Been going on for awhile 9. OTHER SYMPTOMS: Do you have any other symptoms? (e.g., dizziness, runny nose, cough, chest pain, fever)     no 10. O2 SATURATION MONITOR:  Do you use an oxygen saturation monitor (pulse oximeter) at home? If Yes, ask: What is your reading (oxygen level) today? What is your usual oxygen saturation reading? (e.g., 95%)       93% on Room air.  12. TRAVEL: Have you traveled out of the country in the last month? (e.g., travel history, exposures)       No  Patient calling with increased shortness of breath. Patient states she has been dealing with shortness of breath for a long period of time. Patient endorses increased shortness of breath with activity. Patient saw oncologist today who stated she needed to be evaluated by pulmonary for possible oxygen referral. Patient reports oxygen is 93% on room air. Unable to say what her oxygen is with ambulation. Patient is wanting to be seen to get oxygen ordered that she can bring with her for upcoming radiation/chemo appointments.  Protocols used: Breathing Difficulty-A-AH

## 2024-05-11 DIAGNOSIS — C52 Malignant neoplasm of vagina: Secondary | ICD-10-CM | POA: Diagnosis not present

## 2024-05-11 NOTE — Telephone Encounter (Signed)
 I called the pt and there was no answer- LMTCB. ?

## 2024-05-11 NOTE — Telephone Encounter (Signed)
 Ok to hold ofev  till I see her end of months

## 2024-05-11 NOTE — Telephone Encounter (Signed)
 Ok to hold ofev  til I see her end of this month

## 2024-05-12 ENCOUNTER — Other Ambulatory Visit: Payer: Self-pay

## 2024-05-12 ENCOUNTER — Telehealth: Payer: Self-pay

## 2024-05-12 ENCOUNTER — Inpatient Hospital Stay (HOSPITAL_BASED_OUTPATIENT_CLINIC_OR_DEPARTMENT_OTHER): Admitting: Hematology and Oncology

## 2024-05-12 ENCOUNTER — Encounter: Payer: Self-pay | Admitting: Hematology and Oncology

## 2024-05-12 ENCOUNTER — Inpatient Hospital Stay: Attending: Gynecologic Oncology

## 2024-05-12 VITALS — BP 137/68 | HR 81 | Temp 97.7°F | Resp 18 | Ht 65.6 in | Wt 193.8 lb

## 2024-05-12 DIAGNOSIS — I4819 Other persistent atrial fibrillation: Secondary | ICD-10-CM | POA: Insufficient documentation

## 2024-05-12 DIAGNOSIS — E876 Hypokalemia: Secondary | ICD-10-CM | POA: Insufficient documentation

## 2024-05-12 DIAGNOSIS — J84112 Idiopathic pulmonary fibrosis: Secondary | ICD-10-CM | POA: Diagnosis not present

## 2024-05-12 DIAGNOSIS — Z510 Encounter for antineoplastic radiation therapy: Secondary | ICD-10-CM | POA: Insufficient documentation

## 2024-05-12 DIAGNOSIS — C52 Malignant neoplasm of vagina: Secondary | ICD-10-CM | POA: Diagnosis not present

## 2024-05-12 DIAGNOSIS — E878 Other disorders of electrolyte and fluid balance, not elsewhere classified: Secondary | ICD-10-CM | POA: Insufficient documentation

## 2024-05-12 DIAGNOSIS — Z7189 Other specified counseling: Secondary | ICD-10-CM | POA: Diagnosis not present

## 2024-05-12 DIAGNOSIS — Z5111 Encounter for antineoplastic chemotherapy: Secondary | ICD-10-CM | POA: Insufficient documentation

## 2024-05-12 MED ORDER — ONDANSETRON HCL 8 MG PO TABS: 8.0000 mg | ORAL_TABLET | Freq: Three times a day (TID) | ORAL | 1 refills | Status: DC | PRN

## 2024-05-12 MED ORDER — LIDOCAINE-PRILOCAINE 2.5-2.5 % EX CREA: TOPICAL_CREAM | CUTANEOUS | 3 refills | Status: DC

## 2024-05-12 MED ORDER — PROCHLORPERAZINE MALEATE 10 MG PO TABS: 10.0000 mg | ORAL_TABLET | Freq: Four times a day (QID) | ORAL | 1 refills | Status: DC | PRN

## 2024-05-12 NOTE — Progress Notes (Signed)
 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  We discussed the role of concurrent chemoradiation therapy Further molecular testing in progress She is scheduled for port placement next week along with examination under anesthesia next week Due to her severe neuropathy and poor healing, she is not a candidate for cisplatin I recommend concurrent chemoradiation therapy with carboplatin I discussed side effects with the patient and she agreed to proceed Since she will be getting labs on Monday, she will get her first dose of chemotherapy on Friday I will see her on a weekly basis for further follow-up  Persistent atrial fibrillation (HCC) Recent echocardiogram showed preserved ejection fraction She will continue medical management Idiopathic pulmonary fibrosis (HCC) I reviewed the case with her pulmonologist There is no contraindication for her to proceed with Ofev  while on treatment Goals of care, counseling/discussion The patient appears somewhat forgetful We discussed the role of medical healthcare power of attorney to help and assist on medical decisions in the future especially in view she has stage IV disease Will consult social worker for assistance  No orders of the defined types were placed in this encounter.    Sheena Jacobs, MD  INTERVAL HISTORY: she returns for surveillance follow-up and review of plan of care I reviewed multiple appointments pending next week She had recent echocardiogram which showed normal  ejection fraction She was concerned about recent hypoxia but oxygen saturation is good today She inquired about the stage of her disease and appears to have some forgetfulness about what was discussed in prior visit She is here accompanied by family member We discussed the stage of disease, the role of chemotherapy and side effects to be expected  PHYSICAL EXAMINATION: ECOG PERFORMANCE STATUS: 2 - Symptomatic, <50% confined to bed  Vitals:   05/12/24 1306  BP: 137/68  Pulse: 81  Resp: 18  Temp: 97.7 F (36.5 C)  SpO2: 97%   Filed Weights   05/12/24 1306  Weight: 193 lb 12.8 oz (87.9 kg)    Relevant data reviewed during this visit included radiation oncology documentation, echocardiogram

## 2024-05-12 NOTE — Assessment & Plan Note (Addendum)
 Recent echocardiogram showed preserved ejection fraction She will continue medical management

## 2024-05-12 NOTE — Assessment & Plan Note (Addendum)
 The patient appears somewhat forgetful We discussed the role of medical healthcare power of attorney to help and assist on medical decisions in the future especially in view she has stage IV disease Will consult social worker for assistance

## 2024-05-12 NOTE — Progress Notes (Signed)
 Pharmacist Chemotherapy Monitoring - Initial Assessment    Anticipated start date: 05/19/24   The following has been reviewed per standard work regarding the patient's treatment regimen: The patient's diagnosis, treatment plan and drug doses, and organ/hematologic function Lab orders and baseline tests specific to treatment regimen  The treatment plan start date, drug sequencing, and pre-medications Prior authorization status  Patient's documented medication list, including drug-drug interaction screen and prescriptions for anti-emetics and supportive care specific to the treatment regimen The drug concentrations, fluid compatibility, administration routes, and timing of the medications to be used The patient's access for treatment and lifetime cumulative dose history, if applicable  The patient's medication allergies and previous infusion related reactions, if applicable   Changes made to treatment plan:  N/A  Follow up needed:  Pending authorization for treatment    Cherylynn Cosier, RPH, 05/12/2024  3:00 PM

## 2024-05-12 NOTE — Assessment & Plan Note (Addendum)
 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

## 2024-05-12 NOTE — Telephone Encounter (Signed)
 Clinical Social Work was referred by medical provider to address advance directives.  CSW attempted to contact patient by phone.  Left voicemail with contact information for her primary social worker, Michelle Zavala.

## 2024-05-12 NOTE — Assessment & Plan Note (Addendum)
 I reviewed the case with her pulmonologist There is no contraindication for her to proceed with Ofev  while on treatment

## 2024-05-14 DIAGNOSIS — C52 Malignant neoplasm of vagina: Secondary | ICD-10-CM | POA: Diagnosis not present

## 2024-05-14 NOTE — Progress Notes (Signed)
 COVID Vaccine received:  []  No [x]  Yes Date of any COVID positive Test in last 90 days:  PCP - Charlotta Cook, MD  Cardiologist - Luana Rumple, MD  Pulmonology- Maire Scot, MD  Hematology- Jacquelyn Matt, MD    Chest x-ray - 10-12-2022 2v Epic EKG - 05-06-2024  Epic  Stress Test - Lexiscan  02-25-2022  Epic ECHO - 05-05-2024  Epic Cardiac Cath -  CT Coronary Calcium score:   Bowel Prep - [x]  No  []   Yes ______  Pacemaker / ICD device [x]  No []  Yes   Spinal Cord Stimulator:[x]  No []  Yes       History of Sleep Apnea? [x]  No []  Yes   CPAP used?- [x]  No []  Yes    Does the patient monitor blood sugar?   [x]  N/A   []  No []  Yes  Patient has: [x]  NO Hx DM   []  Pre-DM   []  DM1  []   DM2  Blood Thinner / Instructions: Eliquis  -- can continue per Dr. Danniel Duverney note- verified with Lovett Ruck, patient is aware Aspirin Instructions:  none  ERAS Protocol Ordered: []  No  [x]  Yes PRE-SURGERY []  ENSURE  []  G2   [x]  No Drink Ordered Patient is to be NPO after: 0830  Dental hx: []  Dentures:  [x]  N/A      []  Bridge or Partial:   has Implants                  []  Loose or Damaged teeth:    Activity level: Able to walk up 2 flights of stairs without becoming significantly short of breath or having chest pain?  []  No   []    Yes   Anesthesia review: HTN, A.Fib (Cardioversion 03-10-24), [place] fibrosis, CHF, B-Cell lymphoma, Short term memory loss, GERD,  Current chemo.  HOH-   Patient denies shortness of breath, fever, cough and chest pain at PAT appointment.  Patient verbalized understanding and agreement to the Pre-Surgical Instructions that were given to them at this PAT appointment. Patient was also educated of the need to review these PAT instructions again prior to her surgery.I reviewed the appropriate phone numbers to call if they have any and questions or concerns.

## 2024-05-14 NOTE — Patient Instructions (Signed)
 SURGICAL WAITING ROOM VISITATION Patients having surgery or a procedure may have no more than 2 support people in the waiting area - these visitors may rotate in the visitor waiting room.   If the patient needs to stay at the hospital during part of their recovery, the visitor guidelines for inpatient rooms apply.  PRE-OP VISITATION  Pre-op nurse will coordinate an appropriate time for 1 support person to accompany the patient in pre-op.  This support person may not rotate.  This visitor will be contacted when the time is appropriate for the visitor to come back in the pre-op area.  Please refer to the [facility] website for the visitor guidelines for Inpatients (after your surgery is over and you are in a regular room).  You are not required to quarantine at this time prior to your surgery. However, you must do this: Hand Hygiene often Do NOT share personal items Notify your provider if you are in close contact with someone who has COVID or you develop fever 100.4 or greater, new onset of sneezing, cough, sore throat, shortness of breath or body aches.  If you test positive for Covid or have been in contact with anyone that has tested positive in the last 10 days please notify you surgeon.    Your procedure is scheduled on:   [facility]  May 17, 2024  Report to [facility] Main Entrance: [address] entrance where the [employer] is available.   Report to admitting at:   09:15 AM  Call this number if you have any questions or problems the morning of surgery [phone]  FOLLOW ANY ADDITIONAL PRE OP INSTRUCTIONS YOU RECEIVED FROM YOUR SURGEON'S OFFICE!!!  Do not eat food after Midnight the night prior to your surgery/procedure.  After Midnight you may have the following liquids until   08:30 AM DAY OF SURGERY  Clear Liquid Diet Water  Black Coffee (sugar ok, NO MILK/CREAM OR CREAMERS)  Tea (sugar ok, NO MILK/CREAM OR CREAMERS) regular and decaf                              Plain Jell-O  with no fruit (NO RED)                                           Fruit ices (not with fruit pulp, NO RED)                                     Popsicles (NO RED)                                                                  Juice: NO CITRUS JUICES: only apple, WHITE grape, WHITE cranberry Sports drinks like Gatorade or Powerade (NO RED)               Oral Hygiene is also important to reduce your risk of infection.        Remember - BRUSH YOUR TEETH THE MORNING OF SURGERY WITH YOUR REGULAR TOOTHPASTE  Do NOT smoke  after Midnight the night before surgery.  STOP TAKING all Vitamins, Herbs and supplements 1 week before your surgery.   Take ONLY these medicines the morning of surgery with A SIP OF WATER : Pantoprazole , levothyroxine , metoprolol , sertraline , xanax  and Tylenol if needed. You may use your Albuterol  inhaler (Please bring this with you on your surgery day)                   You may not have any metal on your body including hair pins, jewelry, and body piercing  Do not wear make-up, lotions, powders, perfumes or deodorant  Do not wear nail polish including gel and S&S, artificial / acrylic nails, or any other type of covering on natural nails including finger and toenails. If you have artificial nails, gel coating, etc., that needs to be removed by a nail salon, Please have this removed prior to surgery. Not doing so may mean that your surgery could be cancelled or delayed if the Surgeon or anesthesia staff feels like they are unable to monitor you safely.   Do not shave 48 hours prior to surgery to avoid nicks in your skin which may contribute to postoperative infections.   Contacts, Hearing Aids, dentures or bridgework may not be worn into surgery. DENTURES WILL BE REMOVED PRIOR TO SURGERY PLEASE DO NOT APPLY Poly grip OR ADHESIVES!!!  Patients discharged on the day of surgery will not be allowed to drive home.  Someone NEEDS to stay with you for the first 24  hours after anesthesia.  Do not bring your home medications to the hospital. The Pharmacy will dispense medications listed on your medication list to you during your admission in the Hospital.  Special Instructions: Bring a copy of your healthcare power of attorney and living will documents the day of surgery, if you wish to have them scanned into your [facility] Records- EPIC  Please read over the following fact sheets you were given: IF YOU HAVE QUESTIONS ABOUT YOUR PRE-OP INSTRUCTIONS, PLEASE CALL [phone]   [facility] - Preparing for Surgery Before surgery, you can play an important role.  Because skin is not sterile, your skin needs to be as free of germs as possible.  You can reduce the number of germs on your skin by washing with CHG (chlorahexidine gluconate) soap before surgery.  CHG is an antiseptic cleaner which kills germs and bonds with the skin to continue killing germs even after washing. Please DO NOT use if you have an allergy to CHG or antibacterial soaps.  If your skin becomes reddened/irritated stop using the CHG and inform your nurse when you arrive at Short Stay. Do not shave (including legs and underarms) for at least 48 hours prior to the first CHG shower.  You may shave your face/neck.  Please follow these instructions carefully:  1.  Shower with CHG Soap the night before surgery and the  morning of surgery.  2.  If you choose to wash your hair, wash your hair first as usual with your normal  shampoo.  3.  After you shampoo, rinse your hair and body thoroughly to remove the shampoo.                             4.  Use CHG as you would any other liquid soap.  You can apply chg directly to the skin and wash.  Gently with a scrungie or clean washcloth.  5.  Apply the  CHG Soap to your body ONLY FROM THE NECK DOWN.   Do not use on face/ open                           Wound or open sores. Avoid contact with eyes, ears mouth and genitals (private parts).                        Wash face,  Genitals (private parts) with your normal soap.             6.  Wash thoroughly, paying special attention to the area where your  surgery  will be performed.  7.  Thoroughly rinse your body with warm water  from the neck down.  8.  DO NOT shower/wash with your normal soap after using and rinsing off the CHG Soap.            9.  Pat yourself dry with a clean towel.            10.  Wear clean pajamas.            11.  Place clean sheets on your bed the night of your first shower and do not  sleep with pets.  ON THE DAY OF SURGERY : Do not apply any lotions/deodorants the morning of surgery.  Please wear clean clothes to the hospital/surgery center.     FAILURE TO FOLLOW THESE INSTRUCTIONS MAY RESULT IN THE CANCELLATION OF YOUR SURGERY  PATIENT SIGNATURE_________________________________  NURSE SIGNATURE__________________________________  ________________________________________________________________________

## 2024-05-15 ENCOUNTER — Other Ambulatory Visit: Payer: Self-pay

## 2024-05-15 ENCOUNTER — Encounter (HOSPITAL_COMMUNITY): Payer: Self-pay

## 2024-05-15 ENCOUNTER — Telehealth: Payer: Self-pay | Admitting: *Deleted

## 2024-05-15 ENCOUNTER — Telehealth: Payer: Self-pay | Admitting: Oncology

## 2024-05-15 ENCOUNTER — Other Ambulatory Visit: Payer: Self-pay | Admitting: Gynecologic Oncology

## 2024-05-15 ENCOUNTER — Ambulatory Visit: Payer: Self-pay | Admitting: Hematology and Oncology

## 2024-05-15 ENCOUNTER — Other Ambulatory Visit: Payer: Self-pay | Admitting: Hematology and Oncology

## 2024-05-15 ENCOUNTER — Encounter (HOSPITAL_COMMUNITY)
Admission: RE | Admit: 2024-05-15 | Discharge: 2024-05-15 | Disposition: A | Discharge status: Home / Self Care | Source: Ambulatory Visit | Attending: Gynecologic Oncology | Admitting: Gynecologic Oncology

## 2024-05-15 ENCOUNTER — Other Ambulatory Visit: Payer: Self-pay | Admitting: Radiology

## 2024-05-15 ENCOUNTER — Inpatient Hospital Stay

## 2024-05-15 DIAGNOSIS — E876 Hypokalemia: Secondary | ICD-10-CM

## 2024-05-15 DIAGNOSIS — J84112 Idiopathic pulmonary fibrosis: Secondary | ICD-10-CM | POA: Insufficient documentation

## 2024-05-15 DIAGNOSIS — C52 Malignant neoplasm of vagina: Secondary | ICD-10-CM

## 2024-05-15 DIAGNOSIS — I4821 Permanent atrial fibrillation: Secondary | ICD-10-CM | POA: Diagnosis not present

## 2024-05-15 DIAGNOSIS — Z01812 Encounter for preprocedural laboratory examination: Secondary | ICD-10-CM | POA: Diagnosis not present

## 2024-05-15 DIAGNOSIS — I110 Hypertensive heart disease with heart failure: Secondary | ICD-10-CM | POA: Insufficient documentation

## 2024-05-15 DIAGNOSIS — G629 Polyneuropathy, unspecified: Secondary | ICD-10-CM | POA: Insufficient documentation

## 2024-05-15 DIAGNOSIS — I5032 Chronic diastolic (congestive) heart failure: Secondary | ICD-10-CM | POA: Diagnosis not present

## 2024-05-15 DIAGNOSIS — Z7901 Long term (current) use of anticoagulants: Secondary | ICD-10-CM | POA: Insufficient documentation

## 2024-05-15 DIAGNOSIS — Z510 Encounter for antineoplastic radiation therapy: Secondary | ICD-10-CM | POA: Diagnosis not present

## 2024-05-15 HISTORY — DX: Pneumonia, unspecified organism: J18.9

## 2024-05-15 HISTORY — DX: Unspecified osteoarthritis, unspecified site: M19.90

## 2024-05-15 HISTORY — DX: Cardiac arrhythmia, unspecified: I49.9

## 2024-05-15 HISTORY — DX: Heart failure, unspecified: I50.9

## 2024-05-15 LAB — COMPREHENSIVE METABOLIC PANEL WITH GFR
ALT: 13 U/L (ref 0–44)
AST: 27 U/L (ref 15–41)
Albumin: 3.9 g/dL (ref 3.5–5.0)
Alkaline Phosphatase: 61 U/L (ref 38–126)
Anion gap: 14 (ref 5–15)
BUN: 21 mg/dL (ref 8–23)
CO2: 30 mmol/L (ref 22–32)
Calcium: 9.3 mg/dL (ref 8.9–10.3)
Chloride: 90 mmol/L — ABNORMAL LOW (ref 98–111)
Creatinine, Ser: 1.27 mg/dL — ABNORMAL HIGH (ref 0.44–1.00)
GFR, Estimated: 41 mL/min — ABNORMAL LOW (ref >60)
Glucose, Bld: 109 mg/dL — ABNORMAL HIGH (ref 70–99)
Potassium: 2.7 mmol/L — CL (ref 3.5–5.1)
Sodium: 134 mmol/L — ABNORMAL LOW (ref 135–145)
Total Bilirubin: 1.2 mg/dL (ref 0.0–1.2)
Total Protein: 7.9 g/dL (ref 6.5–8.1)

## 2024-05-15 LAB — BASIC METABOLIC PANEL WITH GFR
Anion gap: 12 (ref 5–15)
BUN: 22 mg/dL (ref 8–23)
CO2: 33 mmol/L — ABNORMAL HIGH (ref 22–32)
Calcium: 9.3 mg/dL (ref 8.9–10.3)
Chloride: 91 mmol/L — ABNORMAL LOW (ref 98–111)
Creatinine, Ser: 1.22 mg/dL — ABNORMAL HIGH (ref 0.44–1.00)
GFR, Estimated: 43 mL/min — ABNORMAL LOW (ref >60)
Glucose, Bld: 159 mg/dL — ABNORMAL HIGH (ref 70–99)
Potassium: 2.4 mmol/L — CL (ref 3.5–5.1)
Sodium: 136 mmol/L (ref 135–145)

## 2024-05-15 LAB — CBC WITH DIFFERENTIAL/PLATELET
Abs Immature Granulocytes: 0.05 10*3/uL (ref 0.00–0.07)
Basophils Absolute: 0.1 10*3/uL (ref 0.0–0.1)
Basophils Relative: 0 %
Eosinophils Absolute: 0.1 10*3/uL (ref 0.0–0.5)
Eosinophils Relative: 1 %
HCT: 40.1 % (ref 36.0–46.0)
Hemoglobin: 13.2 g/dL (ref 12.0–15.0)
Immature Granulocytes: 0 %
Lymphocytes Relative: 38 %
Lymphs Abs: 5.7 10*3/uL — ABNORMAL HIGH (ref 0.7–4.0)
MCH: 30.8 pg (ref 26.0–34.0)
MCHC: 32.9 g/dL (ref 30.0–36.0)
MCV: 93.7 fL (ref 80.0–100.0)
Monocytes Absolute: 1 10*3/uL (ref 0.1–1.0)
Monocytes Relative: 7 %
Neutro Abs: 7.9 10*3/uL — ABNORMAL HIGH (ref 1.7–7.7)
Neutrophils Relative %: 54 %
Platelets: 418 10*3/uL — ABNORMAL HIGH (ref 150–400)
RBC: 4.28 MIL/uL (ref 3.87–5.11)
RDW: 13.2 % (ref 11.5–15.5)
WBC: 14.8 10*3/uL — ABNORMAL HIGH (ref 4.0–10.5)
nRBC: 0 % (ref 0.0–0.2)

## 2024-05-15 LAB — CBC WITH DIFFERENTIAL (CANCER CENTER ONLY)
Abs Immature Granulocytes: 0.03 10*3/uL (ref 0.00–0.07)
Basophils Absolute: 0.1 10*3/uL (ref 0.0–0.1)
Basophils Relative: 0 %
Eosinophils Absolute: 0.1 10*3/uL (ref 0.0–0.5)
Eosinophils Relative: 1 %
HCT: 35.6 % — ABNORMAL LOW (ref 36.0–46.0)
Hemoglobin: 12.5 g/dL (ref 12.0–15.0)
Immature Granulocytes: 0 %
Lymphocytes Relative: 40 %
Lymphs Abs: 5.6 10*3/uL — ABNORMAL HIGH (ref 0.7–4.0)
MCH: 31.2 pg (ref 26.0–34.0)
MCHC: 35.1 g/dL (ref 30.0–36.0)
MCV: 88.8 fL (ref 80.0–100.0)
Monocytes Absolute: 0.9 10*3/uL (ref 0.1–1.0)
Monocytes Relative: 6 %
Neutro Abs: 7.3 10*3/uL (ref 1.7–7.7)
Neutrophils Relative %: 53 %
Platelet Count: 391 10*3/uL (ref 150–400)
RBC: 4.01 MIL/uL (ref 3.87–5.11)
RDW: 13.2 % (ref 11.5–15.5)
WBC Count: 13.9 10*3/uL — ABNORMAL HIGH (ref 4.0–10.5)
nRBC: 0 % (ref 0.0–0.2)

## 2024-05-15 LAB — MAGNESIUM: Magnesium: 1.4 mg/dL — ABNORMAL LOW (ref 1.7–2.4)

## 2024-05-15 NOTE — H&P (Signed)
 Chief Complaint: Vaginal cancer; referred  for Port-A-Cath placement to assist with treatment  Referring Provider(s): Gorsuch,N  Supervising Physician: Susan Ensign  Patient Status: [facility] - Out-pt  History of Present Illness: [name] is an 88 y.o. female with past medical history significant for arthritis, CHF, B-cell chronic lymphoproliferative disorder, depression, hypertension, hypothyroidism, atrial fibrillation, pulmonary fibrosis and recently diagnosed locally advanced stage IV vaginal cancer.  She is scheduled today for Port-A-Cath placement to assist with treatment.  *** Patient is Full Code  Past Medical History:  Diagnosis Date   Arthritis    CHF (congestive heart failure) (HCC)    Chronic Leukemia    Depression    Dysrhythmia    A.fib   on Eliquis    H/O hiatal hernia    Hernia    Hernia    right inguinal hernia   Hypertension    Hypothyroidism    Leg swelling    Osteitis pubis (HCC) 05/30/2001   Osteopenia 03/30/2009   Osteopenia 07/30/2022   Bone density 2021 showed elevated FRAX patient was started on Fosamax    Pneumonia    Thyroid  disease     Past Surgical History:  Procedure Laterality Date   ABDOMINAL HYSTERECTOMY  03/1970   APPENDECTOMY  03/1966   BILATERAL SALPINGOOPHORECTOMY     CARDIOVERSION N/A 03/10/2022   Procedure: CARDIOVERSION;  Surgeon: Luana Rumple, MD;  Location: [facility];  Service: Cardiovascular;  Laterality: N/A;   CATARACT EXTRACTION W/PHACO Right 09/25/2014   Procedure: CATARACT EXTRACTION PHACO AND INTRAOCULAR LENS PLACEMENT (IOC);  Surgeon: Lavonia Powers T. Gennie Kicks, MD;  Location: [facility];  Service: Ophthalmology;  Laterality: Right;  CDE:7.56   CATARACT EXTRACTION W/PHACO Left 10/09/2014   Procedure: CATARACT EXTRACTION PHACO AND INTRAOCULAR LENS PLACEMENT LEFT EYE CDE=6.51;  Surgeon: Lavonia Powers T. Gennie Kicks, MD;  Location: [facility];  Service: Ophthalmology;  Laterality: Left;   CHOLECYSTECTOMY  1980   COLONOSCOPY  1999    COLONOSCOPY  March 2007   Repeat 10 years   COLONOSCOPY  11/06/2003    Internal and external hemorrhoids/Two tiny rectal distal sigmoid polyps cold biopsied/Otherwise within normal limits to the cecum   COLONOSCOPY N/A 08/29/2014   Dr. Riley Cheadle: Sessile polyp was found at the cecum status post cold snare polypectomy, tubular adenoma. No further surveillance unless clinical status changes   ESOPHAGOGASTRODUODENOSCOPY  2007   Dr. Riley Cheadle: normal esophagus, small tom oderate sized hiatal hernia, Cameron lesions   SKIN CANCER EXCISION Left 11 1 2016   left arm squamous cancer   TUMOR REMOVAL  03/1968   ovarian    Allergies: Erythromycin, Aspirin, and Ibuprofen  Medications: Prior to Admission medications   Medication Sig Start Date End Date Taking? Authorizing Provider  acetaminophen (TYLENOL) 650 MG CR tablet Take 650 mg by mouth every 8 (eight) hours as needed for pain.    [provider]  albuterol  (VENTOLIN  HFA) 108 (90 Base) MCG/ACT inhaler Inhale 2 puffs into the lungs every 6 (six) hours as needed for wheezing or shortness of breath. 03/08/24   Grooms, Courtney, PA-C  alendronate  (FOSAMAX ) 70 MG tablet TAKE 1 TABLET(70 MG) BY MOUTH EVERY 7 DAYS WITH A FULL GLASS OF WATER  AND ON AN EMPTY STOMACH 08/16/23   Luking, Jackelyn Marvel, MD  ALPRAZolam  (XANAX ) 0.5 MG tablet TAKE 1 TABLET BY MOUTH EVERY NIGHT AT BEDTIME. MAY TAKE 1/2 TABLET DURING THE DAY AS NEEDED 04/04/24   [name], MD  Cholecalciferol (VITAMIN D3) 2000 UNITS TABS Take 2,000 Units by mouth daily.  [provider]  ELIQUIS  5 MG TABS tablet TAKE 1 TABLET(5 MG) BY MOUTH TWICE DAILY. FOLLOW-UP 03/30/24   [name], [name], MD  indapamide  (LOZOL ) 1.25 MG tablet Take 1 tablet (1.25 mg total) by mouth every morning. 02/11/24   [name], MD  levothyroxine  (SYNTHROID ) 125 MCG tablet TAKE 1 TABLET BY MOUTH DAILY 02/14/24   [name], MD  lidocaine -prilocaine (EMLA) cream Apply to affected area once 05/12/24   Gorsuch, Ni,  MD  metoprolol  succinate (TOPROL -XL) 25 MG 24 hr tablet TAKE 1 TABLET BY MOUTH DAILY 01/18/24   [name], MD  Multiple Vitamin (MULTI-VITAMIN DAILY PO) Take 1 tablet by mouth daily.    [provider]  Nintedanib (OFEV ) 150 MG CAPS Take 1 capsule (150 mg total) by mouth 2 (two) times daily. Patient not taking: Reported on 05/10/2024 11/02/23   Aleck Hurdle, MD  ondansetron  (ZOFRAN ) 8 MG tablet Take 1 tablet (8 mg total) by mouth every 8 (eight) hours as needed for nausea or vomiting. Start on the third day after chemotherapy. 05/12/24   Almeda Jacobs, MD  ondansetron  (ZOFRAN -ODT) 4 MG disintegrating tablet Take 1 tablet (4 mg total) by mouth every 8 (eight) hours as needed. 05/06/24   Leath-Warren, Belen Bowers, NP  pantoprazole  (PROTONIX ) 40 MG tablet TAKE 1 TABLET(40 MG) BY MOUTH DAILY 10/21/23   [name], MD  potassium chloride  (KLOR-CON  M) 10 MEQ tablet TAKE 1 TABLET(10 MEQ) BY MOUTH DAILY 05/05/24   [name], MD  prochlorperazine (COMPAZINE) 10 MG tablet Take 1 tablet (10 mg total) by mouth every 6 (six) hours as needed for nausea or vomiting. 05/12/24   Almeda Jacobs, MD  sertraline  (ZOLOFT ) 100 MG tablet TAKE 1 TABLET BY MOUTH EVERY DAY 02/21/24   [name], MD     Family History  Problem Relation Age of Onset   Dementia Mother    Heart disease Father    Cancer Father        throat   Cancer Sister        breast   Breast cancer Sister    Lung cancer Grandson    Colon cancer Neg Hx    Lung disease Neg Hx    Autoimmune disease Neg Hx     Social History   Socioeconomic History   Marital status: Widowed    Spouse name: Not on file   Number of children: 1   Years of education: 17   Highest education level: Not on file  Occupational History   Occupation: Retired    Associate Professor: RETIRED    Comment: Ship broker, 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   Widowed since 08-07-2021. Husband W.E. Maricle, died from Kidney Cancer.   Son-Tommy lives nearby.   2 grandson's deceased, Clint and Ace Holder. Ace Holder passed 02/2022 from Brain Cancer.    Social Drivers of Health   Financial Resource Strain: Low Risk  (04/05/2024)   Overall Financial Resource Strain (CARDIA)  Difficulty of Paying Living Expenses: Not hard at all  Food Insecurity: No Food Insecurity (04/05/2024)   Hunger Vital Sign    Worried About Running Out of Food in the Last Year: Never true    Ran Out of Food in the Last Year: Never true  Transportation Needs: No Transportation Needs (04/05/2024)   PRAPARE - Administrator, Civil Service (Medical): No    Lack of Transportation (Non-Medical): No  Physical Activity: Insufficiently Active (04/05/2024)   Exercise Vital Sign    Days of Exercise per Week: 2 days    Minutes of Exercise per Session: 10 min  Stress: No Stress Concern Present (04/05/2024)   [employer] of Occupational Health - Occupational Stress Questionnaire    Feeling of Stress : Only a little  Social Connections: Moderately Integrated (04/05/2024)   Social Connection and Isolation Panel    Frequency of Communication with Friends and Family: More than three times a week    Frequency of Social Gatherings with Friends and Family: Twice a week    Attends Religious Services: More than 4 times per year    Active Member of [employer] or Organizations: Yes    Attends Banker Meetings: More than 4 times per year    Marital Status: Widowed       Review of Systems  Vital Signs:   Advance Care Plan: No documents on file  Physical  Exam  Imaging: ECHOCARDIOGRAM COMPLETE Result Date: 05/05/2024    ECHOCARDIOGRAM REPORT   Patient Name:   ASYRIA KOLANDER Date of Exam: 05/05/2024 Medical Rec #:  [id number]       Height:       65.5 in Accession #:    [id number]      Weight:       191.6 lb Date of Birth:  January 29, 1936       BSA:          1.953 m Patient Age:    88 years        BP:           138/72 mmHg Patient Gender: F               HR:           99 bpm. Exam Location:  [address] Procedure: 2D Echo, Cardiac Doppler and Color Doppler (Both Spectral and Color            Flow Doppler were utilized during procedure). Indications:    I50.32 CHF  History:        Patient has prior history of Echocardiogram examinations, most                 recent 02/10/2022. CHF, CAD, Pulmonary HTN, Arrythmias:Atrial                 Fibrillation; Risk Factors:Dyslipidemia and Hypertension.  Sonographer:    Lula Sale RDCS Referring Phys: [id number] [place] IMPRESSIONS  1. Left ventricular ejection fraction, by estimation, is 55 to 60%. The left ventricle has normal function. The left ventricle has no regional wall motion abnormalities. There is mild concentric left ventricular hypertrophy. Left ventricular diastolic function could not be evaluated.  2. Right ventricular systolic function is mildly reduced. The right ventricular size is mildly enlarged. There is normal pulmonary artery systolic pressure. The estimated right ventricular systolic pressure is 35.9 mmHg.  3. Left atrial size was severely dilated.  4. Right atrial size was severely dilated.  5. The  mitral valve is abnormal. Mild mitral valve regurgitation. No evidence of mitral stenosis.  6. Tricuspid valve regurgitation is mild to moderate.  7. The aortic valve is tricuspid. There is mild calcification of the aortic valve. Aortic valve regurgitation is not visualized. Aortic valve sclerosis/calcification is present, without any evidence of aortic stenosis.  8. The inferior vena cava is normal in  size with greater than 50% respiratory variability, suggesting right atrial pressure of 3 mmHg. FINDINGS  Left Ventricle: Left ventricular ejection fraction, by estimation, is 55 to 60%. The left ventricle has normal function. The left ventricle has no regional wall motion abnormalities. The left ventricular internal cavity size was normal in size. There is  mild concentric left ventricular hypertrophy. Left ventricular diastolic function could not be evaluated due to atrial fibrillation. Left ventricular diastolic function could not be evaluated. Right Ventricle: The right ventricular size is mildly enlarged. No increase in right ventricular wall thickness. Right ventricular systolic function is mildly reduced. There is normal pulmonary artery systolic pressure. The tricuspid regurgitant velocity  is 2.87 m/s, and with an assumed right atrial pressure of 3 mmHg, the estimated right ventricular systolic pressure is 35.9 mmHg. Left Atrium: Left atrial size was severely dilated. Right Atrium: Right atrial size was severely dilated. Pericardium: There is no evidence of pericardial effusion. Mitral Valve: The mitral valve is abnormal. Mild mitral annular calcification. Mild mitral valve regurgitation. No evidence of mitral valve stenosis. Tricuspid Valve: The tricuspid valve is normal in structure. Tricuspid valve regurgitation is mild to moderate. No evidence of tricuspid stenosis. Aortic Valve: The aortic valve is tricuspid. There is mild calcification of the aortic valve. Aortic valve regurgitation is not visualized. Aortic valve sclerosis/calcification is present, without any evidence of aortic stenosis. Pulmonic Valve: The pulmonic valve was normal in structure. Pulmonic valve regurgitation is mild. No evidence of pulmonic stenosis. Aorta: The aortic root is normal in size and structure. Venous: The inferior vena cava is normal in size with greater than 50% respiratory variability, suggesting right atrial pressure  of 3 mmHg. IAS/Shunts: No atrial level shunt detected by color flow Doppler.  LEFT VENTRICLE PLAX 2D LVIDd:         3.90 cm   Diastology LVIDs:         2.70 cm   LV e' medial:    6.02 cm/s LV PW:         1.10 cm   LV E/e' medial:  16.6 LV IVS:        1.20 cm   LV e' lateral:   8.27 cm/s LVOT diam:     1.70 cm   LV E/e' lateral: 12.0 LV SV:         35 LV SV Index:   18 LVOT Area:     2.27 cm  RIGHT VENTRICLE            IVC RVSP:           35.9 mmHg  IVC diam: 1.00 cm LEFT ATRIUM              Index        RIGHT ATRIUM           Index LA diam:        4.70 cm  2.41 cm/m   RA Pressure: 3.00 mmHg LA Vol (A2C):   64.9 ml  33.22 ml/m  RA Area:     21.10 cm LA Vol (A4C):   100.0 ml 51.19 ml/m  RA Volume:  57.10 ml  29.23 ml/m LA Biplane Vol: 85.1 ml  43.56 ml/m  AORTIC VALVE LVOT Vmax:   82.70 cm/s LVOT Vmean:  57.640 cm/s LVOT VTI:    0.154 m  AORTA Ao Root diam: 3.20 cm Ao Asc diam:  2.90 cm MITRAL VALVE               TRICUSPID VALVE MV Area (PHT): 3.43 cm    TR Peak grad:   32.9 mmHg MV Decel Time: 221 msec    TR Vmax:        287.00 cm/s MV E velocity: 99.62 cm/s  Estimated RAP:  3.00 mmHg                            RVSP:           35.9 mmHg                             SHUNTS                            Systemic VTI:  0.15 m                            Systemic Diam: 1.70 cm Jules Oar MD Electronically signed by Jules Oar MD Signature Date/Time: 05/05/2024/8:10:46 PM    Final    NM PET Image Initial (PI) Whole Body (F-18 FDG) Result Date: 04/26/2024 CLINICAL DATA:  Initial treatment strategy for vaginal cancer. EXAM: NUCLEAR MEDICINE PET WHOLE BODY TECHNIQUE: 9.4 mCi F-18 FDG was injected intravenously. Full-ring PET imaging was performed from the head to foot after the radiotracer. CT data was obtained and used for attenuation correction and anatomic localization. Fasting blood glucose: 105 mg/dl COMPARISON:  Pelvic MRI 04/20/2024 FINDINGS: Mediastinal blood pool activity: SUV max 3.0 HEAD/NECK: No  significant abnormal hypermetabolic activity in this region. Incidental CT findings: none CHEST: No significant abnormal hypermetabolic activity in this region. Incidental CT findings: Aortic and branch vessel atherosclerosis. Moderate cardiomegaly. Mild nonspecific peripheral interstitial accentuation in the lungs, cannot exclude mild interstitial edema. ABDOMEN/PELVIS: Posterior to the urinary bladder along the upper third of the vaginal cuff, a hypermetabolic lesion has maximum SUV of 25.5, compatible with malignancy. A right external iliac node measuring 0.8 cm in short axis on image 184 series 4 has maximum SUV of 6.6, compatible with malignant involvement. Along the anterior margin of an otherwise simple right mid to lower kidney cyst, a 1.2 by 2.3 cm higher density component is present on image 137 of series 4. This could simply reflect blood products of a complex cyst, but I can confidently exclude low-grade metabolic activity. Dedicated renal protocol MRI with and without contrast is recommended for definitive characterization. Incidental CT findings: Hiatal hernia with adjacent postoperative findings under is metal clips in the left upper quadrant. Atherosclerosis is present, including aortoiliac atherosclerotic disease. Right inguinal hernia contains adipose tissue and a loop of small bowel without findings of strangulation or obstruction. SKELETON: No significant abnormal hypermetabolic activity in this region. Incidental CT findings: none EXTREMITIES: No significant abnormal hypermetabolic activity in this region. Incidental CT findings: Incidental fatty components of the medial head gastrocnemius muscle on the right. IMPRESSION: 1. Hypermetabolic lesion along the upper third of the vaginal cuff posterior to the urinary bladder, compatible with malignancy. 2. A 0.8  cm right external iliac node has maximum SUV of 6.6, compatible with malignant involvement. 3. Along the anterior margin of an otherwise  simple right mid to lower kidney cyst, a 1.2 by 2.3 cm higher density component is present. This could simply reflect blood products of a complex cyst, but I can confidently exclude low-grade metabolic activity. Dedicated renal protocol MRI with and without contrast is recommended for definitive characterization, to exclude synchronous renal mass. 4. Right inguinal hernia contains adipose tissue and a loop of small bowel without findings of strangulation or obstruction. 5. Hiatal hernia with adjacent postoperative findings under is metal clips in the left upper quadrant. 6. Moderate cardiomegaly. Mild nonspecific peripheral interstitial accentuation in the lungs, cannot exclude mild interstitial edema. 7.  Aortic Atherosclerosis (ICD10-I70.0). Electronically Signed   By: Freida Jes M.D.   On: 04/26/2024 21:03   MR PELVIS W WO CONTRAST Result Date: 04/26/2024 CLINICAL DATA:  Vaginal carcinoma. EXAM: MRI PELVIS WITHOUT AND WITH CONTRAST TECHNIQUE: Multiplanar multisequence MR imaging of the pelvis was performed both before and after administration of intravenous contrast. CONTRAST:  9mL GADAVIST  GADOBUTROL  1 MMOL/ML IV SOLN COMPARISON:  None Available. FINDINGS: Lower Urinary Tract: Mild irregular wall thickening is seen involving the bladder base, which is contiguous with the vaginal soft tissue mass described below, suspicious for direct bladder invasion. Bowel: Unremarkable pelvic bowel loops. Vascular/Lymphatic: 9 mm right external iliac lymph node is seen on image 16/3, which is borderline enlarged. No pathologically enlarged lymph nodes identified. Reproductive: -- Uterus: Prior hysterectomy. Concentric masslike wall thickening is seen involving the upper portion of the vaginal cuff, measuring approximately 3.8 x 2.6 x 3.5 cm. Irregular contour is seen with the adjacent parametrial fat bilaterally, consistent with parametrial invasion. No evidence of distal ureteral involvement or  hydroureteronephrosis. -- Right ovary: Not visualized, however no adnexal mass identified. -- Left ovary:  Not visualized, however no adnexal mass identified. Other: No peritoneal thickening or abnormal free fluid. Moderate right inguinal hernia is seen which contains small bowel. No evidence of bowel obstruction or strangulation. Musculoskeletal:  Unremarkable. IMPRESSION: Concentric mass involving the upper portion of the vaginal cuff, with evidence of bilateral parametrial invasion. Mild irregular bladder wall thickening involving the bladder base, which is contiguous with the vaginal soft tissue mass, highly suspicious for direct bladder invasion. Borderline enlarged 9 mm right external iliac lymph node. Consider PET-CT scan for further evaluation. Moderate right inguinal hernia containing small bowel. No evidence of bowel obstruction or strangulation. Electronically Signed   By: Marlyce Sine M.D.   On: 04/26/2024 16:08    Labs:  CBC: Recent Labs    08/17/23 1111 12/09/23 1010 04/28/24 1321 05/15/24 1001  WBC 11.7* 12.7* 14.9* 14.8*  HGB 13.1 13.2 12.8 13.2  HCT 40.6 39.7 38.0 40.1  PLT 388 377 383 418*    COAGS: No results for input(s): INR, APTT in the last 8760 hours.  BMP: Recent Labs    11/05/23 0943 12/09/23 1010 04/28/24 1321 05/15/24 1001  NA 138 136 133* 134*  K 4.1 4.0 3.6 2.7*  CL 97 97* 94* 90*  CO2 26 33* 29 30  GLUCOSE 80 91 118* 109*  BUN 13 20 16 21   CALCIUM 9.5 9.8 8.9 9.3  CREATININE 0.98 1.07* 0.99 1.27*  GFRNONAA  --  51* 56* 41*    LIVER FUNCTION TESTS: Recent Labs    08/17/23 1111 12/09/23 1010 04/28/24 1321 05/15/24 1001  BILITOT 0.3 0.5 0.5 1.2  AST 22 17 22  27  ALT 12 9 11 13   ALKPHOS 87 62 71 61  PROT 7.1 7.5 7.6 7.9  ALBUMIN 4.1 4.0 3.7 3.9    TUMOR MARKERS: No results for input(s): AFPTM, CEA, CA199, CHROMGRNA in the last 8760 hours.  Assessment and Plan: 88 y.o. female with past medical history significant for  arthritis, CHF, B-cell chronic lymphoproliferative disorder, depression, hypertension, hypothyroidism, atrial fibrillation, pulmonary fibrosis and recently diagnosed locally advanced stage IV vaginal cancer.  She is scheduled today for Port-A-Cath placement to assist with treatment.Risks and benefits of image guided port-a-catheter placement was discussed with the patient including, but not limited to bleeding, infection, pneumothorax, or fibrin sheath development and need for additional procedures.  All of the patient's questions were answered, patient is agreeable to proceed. Consent signed and in chart.    Thank you for allowing our service to participate in JHADA RISK 's care.  Electronically Signed: D. Honore Lux, PA-C   05/15/2024, 1:46 PM      I spent a total of  20 minutes   in face to face in clinical consultation, greater than 50% of which was counseling/coordinating care for Port-A-Cath placement

## 2024-05-15 NOTE — Telephone Encounter (Signed)
 Medication Prior Authorization Status  Processed CoverMyMeds KEY: [id number]  Approved Today  Per [employer] Team [employer]  PA Case ID no.#: [id number] Rx no.#: [id number]:    Effective 05/15/2024 through 08/13/2024  Navigator notified.Aaron Aas

## 2024-05-15 NOTE — Telephone Encounter (Signed)
 Called Brooke, granddaughter and given below message. She verbalized understanding and will call the office back for questions.

## 2024-05-15 NOTE — Telephone Encounter (Signed)
 [name] called and said they tried to get EMLA cream from the pharmacy but it needs a prior authorization. Advised her that a message has been sent to our prior authorization team and they will be working on getting authorization.  Discussed that she will not be able to use it for 2 weeks after getting her port placed so there is time.  [name] also said they would like to set up an appointment with the social worker to discuss advanced directives and healthcare power of attorney.  Advised I will send a message to Michele Zavala, CSW and she should be reaching out to set up an appointment.

## 2024-05-15 NOTE — Telephone Encounter (Signed)
-----   Message from Almeda Jacobs sent at 05/15/2024  3:09 PM EDT ----- [name],  I recommend her to hold Indapamide . Get OTC magnesium oxide 400 mg and take that daily ----- Message ----- From: Interface, Lab In [place] Sent: 05/15/2024   2:52 PM EDT To: Almeda Jacobs, MD

## 2024-05-15 NOTE — Telephone Encounter (Signed)
 Spoke with patient while she was here having her labs re-drawn. Pt given educational handout with potassium rich foods. Pt states she has been voiding frequently and doesn't always feel like she is emptying fully. Message relayed to provider.

## 2024-05-15 NOTE — Telephone Encounter (Signed)
 Attempted to reach patient to relay lab results of low potassium. Pt was called on both numbers listed on chart. Left voicemail requesting call back.   Patient returned call from the office. Spoke with patient in regards to her potassium level of 2.7. Pt denies fatigue, weakness, and no muscle spasms. Pt states, I feel good today better than I have felt. Pt also states she took her potassium this morning.   Also advised patient that her kidney function is elevated and is she keeping herself well hydrated? Pt states she is keeping herself well hydrated drinking lots of water  and juice. Pt reports that her grand daughter took her foley out the evening of the day Dr. Orvil Bland inserted the foley, according to patient they called and spoke with Dr. Orvil Bland. The catheter was pressing against the tumor in vagina?  and it wasn't draining properly.   Pt advised that we need to re-check her potassium level. Pt agreed to return back to the cancer center and have her labs re-drawn. Message relayed to providers.

## 2024-05-16 ENCOUNTER — Encounter: Payer: Self-pay | Admitting: Hematology and Oncology

## 2024-05-16 ENCOUNTER — Encounter (HOSPITAL_COMMUNITY): Payer: Self-pay

## 2024-05-16 ENCOUNTER — Ambulatory Visit (HOSPITAL_COMMUNITY)

## 2024-05-16 ENCOUNTER — Ambulatory Visit (HOSPITAL_COMMUNITY)
Admission: RE | Admit: 2024-05-16 | Discharge: 2024-05-16 | Disposition: A | Discharge status: Home / Self Care | Source: Ambulatory Visit | Attending: Hematology and Oncology | Admitting: Hematology and Oncology

## 2024-05-16 ENCOUNTER — Encounter: Payer: Self-pay | Admitting: Gynecologic Oncology

## 2024-05-16 ENCOUNTER — Inpatient Hospital Stay: Admitting: Hematology and Oncology

## 2024-05-16 ENCOUNTER — Telehealth: Payer: Self-pay | Admitting: *Deleted

## 2024-05-16 ENCOUNTER — Telehealth: Payer: Self-pay | Admitting: Oncology

## 2024-05-16 VITALS — BP 140/59 | HR 77 | Temp 97.8°F | Resp 18 | Ht 65.0 in | Wt 194.2 lb

## 2024-05-16 DIAGNOSIS — C52 Malignant neoplasm of vagina: Secondary | ICD-10-CM | POA: Diagnosis not present

## 2024-05-16 DIAGNOSIS — I110 Hypertensive heart disease with heart failure: Secondary | ICD-10-CM | POA: Insufficient documentation

## 2024-05-16 DIAGNOSIS — I4891 Unspecified atrial fibrillation: Secondary | ICD-10-CM | POA: Diagnosis not present

## 2024-05-16 DIAGNOSIS — I509 Heart failure, unspecified: Secondary | ICD-10-CM | POA: Diagnosis not present

## 2024-05-16 DIAGNOSIS — Z510 Encounter for antineoplastic radiation therapy: Secondary | ICD-10-CM | POA: Diagnosis not present

## 2024-05-16 DIAGNOSIS — E039 Hypothyroidism, unspecified: Secondary | ICD-10-CM | POA: Diagnosis not present

## 2024-05-16 DIAGNOSIS — Z539 Procedure and treatment not carried out, unspecified reason: Secondary | ICD-10-CM | POA: Insufficient documentation

## 2024-05-16 DIAGNOSIS — E876 Hypokalemia: Secondary | ICD-10-CM | POA: Diagnosis not present

## 2024-05-16 DIAGNOSIS — J8410 Pulmonary fibrosis, unspecified: Secondary | ICD-10-CM | POA: Insufficient documentation

## 2024-05-16 DIAGNOSIS — F32A Depression, unspecified: Secondary | ICD-10-CM | POA: Insufficient documentation

## 2024-05-16 LAB — BASIC METABOLIC PANEL WITH GFR
Anion gap: 15 (ref 5–15)
BUN: 20 mg/dL (ref 8–23)
CO2: 33 mmol/L — ABNORMAL HIGH (ref 22–32)
Calcium: 9 mg/dL (ref 8.9–10.3)
Chloride: 89 mmol/L — ABNORMAL LOW (ref 98–111)
Creatinine, Ser: 1.11 mg/dL — ABNORMAL HIGH (ref 0.44–1.00)
GFR, Estimated: 48 mL/min — ABNORMAL LOW (ref >60)
Glucose, Bld: 102 mg/dL — ABNORMAL HIGH (ref 70–99)
Potassium: 2.3 mmol/L — CL (ref 3.5–5.1)
Sodium: 137 mmol/L (ref 135–145)

## 2024-05-16 MED ORDER — SODIUM CHLORIDE 0.9 % IV SOLN: INTRAVENOUS | Status: DC

## 2024-05-16 NOTE — Progress Notes (Signed)
 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  She was supposed to get port placement today However, due to severe hypokalemia, that was discontinued Her examination under anesthesia procedure will likely need to be rescheduled, awaiting further confirmation from the surgical team If that is the case, I will delay the start date of her chemotherapy and rescheduled till next week Hypokalemia due to loss of potassium She has severe hypokalemia, worse compared to 1 day ago Unfortunately, the patient did not receive accurate instruction Today I gave her both verbal and written instruction to discontinue indapamide  She is instructed to increase her oral potassium replacement therapy and I also gave her patient education information on potassium rich diet Hypomagnesemia She has hypomagnesemia She is currently taking oral magnesium supplement We discussed importance of taking oral magnesium along with potassium replacement therapy  No orders of the defined types were placed in this encounter.    Almeda Jacobs, MD  INTERVAL HISTORY: she returns for surveillance follow-up and urgent visit due to significant electrolyte imbalance She had labs drawn on Monday which show significant hypokalemia The patient was given instruction to discontinue indapamide  and to start taking magnesium supplement as she was also found to have hypomagnesemia Unfortunately, the patient continue taking indapamide  She is also not taking oral potassium replacement  therapy consistently We reviewed test results today and discussed medication changes The patient will be contacted later today regarding her planned surgery appointment tomorrow Her port placement is canceled today due to significant electrolyte abnormalities  PHYSICAL EXAMINATION: ECOG PERFORMANCE STATUS: 1 - Symptomatic but completely ambulatory  Vitals:   05/16/24 1105  BP: (!) 140/59  Pulse: 77  Resp: 18  Temp: 97.8 F (36.6 C)  SpO2: 99%   Filed Weights   05/16/24 1105  Weight: 194 lb 3.2 oz (88.1 kg)    Relevant data reviewed during this visit included CBC, BMP, magnesium

## 2024-05-16 NOTE — Progress Notes (Signed)
 Patient ID: [name], female   DOB: [birth year], 88 y.o.   MRN: [id number] Review of pt's labs from yesterday revealed K of 2.7 and repeat of 2.4. Today's K is 2.3. Dr. Marton Sleeper notified. Pt's port a cath placement has been postponed for now. [facility] to [facility] Dr. Marton Sleeper this morning. Pt/granddaughter updated with plans.

## 2024-05-16 NOTE — Assessment & Plan Note (Addendum)
 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

## 2024-05-16 NOTE — Progress Notes (Signed)
 Notified by lab of critical potassium level. Honore Lux PA with radiology aware.

## 2024-05-16 NOTE — Assessment & Plan Note (Addendum)
 She has severe hypokalemia, worse compared to 1 day ago Unfortunately, the patient did not receive accurate instruction Today I gave her both verbal and written instruction to discontinue indapamide  She is instructed to increase her oral potassium replacement therapy and I also gave her patient education information on potassium rich diet

## 2024-05-16 NOTE — Progress Notes (Addendum)
 Anesthesia Chart Review   Case: [id number] Date/Time: 05/17/24 1115   Procedures:      EXAM UNDER ANESTHESIA, PELVIC     CYSTOSCOPY   Anesthesia type: Choice   Diagnosis: Vaginal cancer (HCC) [C52]   Pre-op diagnosis: vaginal cancer   Location: [facility] ROOM 01 / [facility]   Surgeons: Viktoria Comer SAUNDERS, MD       DISCUSSION:88 y.o. never smoker with h/o HTN, CHF, atrial fibrillation, chronic diastolic heart failure, idiopathic pulmonary fibrosis, peripheral neuropathy, locally advanced stage IV vaginal cancer scheduled for above procedure 05/17/2024 with Dr. Comer Viktoria.   Pt last seen by cardiology 04/04/2024. Permanent atrial fibrillation rate controlled on low dose beta blocker, pt is on Eliquis  5 mg daily, remains asymptomatic. Pt euvolemic at this visit, blood pressure adequately controlled. Echo ordered at this visit to evaluate pulmonary HTN. Echo completed 05/05/2024 with EF 55-60%, normal pulmonary artery systolic pressure.   Pt to remain on Eliquis  for procedure per Dr. Viktoria.   Pt last seen by pulmonology 11/02/2023. Per OV note repeat CT and PFTs with no progression of disease.   Pt scheduled for port-a-cath placement 05/16/2024 with IR.   Critical potassium at PAT visit, 2.7. Pt currently on Potassium supplementation.   Potassium continues to drop, 2.4 on 05/16/2024.   Addendum 05/22/2024:  Potassium 3.2 on 05/19/2024. Procedure now rescheduled 05/24/2024. Recheck DOS.  VS: BP 136/70 Comment: Right arm sitting  Pulse 78   Temp 36.9 C (Oral)   Resp 16   Ht 5' 5 (1.651 m)   Wt 87.9 kg   SpO2 97%   BMI 32.25 kg/m   PROVIDERS: [name], MD is PCP   Geronimo Amel, MD, is Pulmonology   Cardiologist:  Jerel Balding, MD  LABS: critical potassium discussed with Eleanor Epps, NP (all labs ordered are listed, but only abnormal results are displayed)  Labs Reviewed  CBC WITH DIFFERENTIAL/PLATELET - Abnormal; Notable for the following components:      Result Value    WBC 14.8 (*)    Platelets 418 (*)    Neutro Abs 7.9 (*)    Lymphs Abs 5.7 (*)    All other components within normal limits  COMPREHENSIVE METABOLIC PANEL WITH GFR - Abnormal; Notable for the following components:   Sodium 134 (*)    Potassium 2.7 (*)    Chloride 90 (*)    Glucose, Bld 109 (*)    Creatinine, Ser 1.27 (*)    GFR, Estimated 41 (*)    All other components within normal limits     IMAGES:   EKG:   CV: Echo 05/05/2024 1. Left ventricular ejection fraction, by estimation, is 55 to 60%. The  left ventricle has normal function. The left ventricle has no regional  wall motion abnormalities. There is mild concentric left ventricular  hypertrophy. Left ventricular diastolic  function could not be evaluated.   2. Right ventricular systolic function is mildly reduced. The right  ventricular size is mildly enlarged. There is normal pulmonary artery  systolic pressure. The estimated right ventricular systolic pressure is  35.9 mmHg.   3. Left atrial size was severely dilated.   4. Right atrial size was severely dilated.   5. The mitral valve is abnormal. Mild mitral valve regurgitation. No  evidence of mitral stenosis.   6. Tricuspid valve regurgitation is mild to moderate.   7. The aortic valve is tricuspid. There is mild calcification of the  aortic valve. Aortic valve regurgitation is not visualized.  Aortic valve  sclerosis/calcification is present, without any evidence of aortic  stenosis.   8. The inferior vena cava is normal in size with greater than 50%  respiratory variability, suggesting right atrial pressure of 3 mmHg.   Myocardial Perfusion 02/25/2022   The study is normal. The study is low risk.   No ST deviation was noted.   LV perfusion is normal.   Prior study not available for comparison.   Normal perfusion. Study was not gated due to atrial fibrillation. This is a low risk study. No prior for comparison. Past Medical History:  Diagnosis Date    Arthritis    CHF (congestive heart failure) (HCC)    Chronic Leukemia    Depression    Dysrhythmia    A.fib   on Eliquis    H/O hiatal hernia    Hernia    Hernia    right inguinal hernia   Hypertension    Hypothyroidism    Leg swelling    Osteitis pubis (HCC) 05/30/2001   Osteopenia 03/30/2009   Osteopenia 07/30/2022   Bone density 2021 showed elevated FRAX patient was started on Fosamax    Pneumonia    Thyroid  disease     Past Surgical History:  Procedure Laterality Date   ABDOMINAL HYSTERECTOMY  03/1970   APPENDECTOMY  03/1966   BILATERAL SALPINGOOPHORECTOMY     CARDIOVERSION N/A 03/10/2022   Procedure: CARDIOVERSION;  Surgeon: Francyne Headland, MD;  Location: [facility];  Service: Cardiovascular;  Laterality: N/A;   CATARACT EXTRACTION W/PHACO Right 09/25/2014   Procedure: CATARACT EXTRACTION PHACO AND INTRAOCULAR LENS PLACEMENT (IOC);  Surgeon: Oneil T. Roz, MD;  Location: [facility];  Service: Ophthalmology;  Laterality: Right;  CDE:7.56   CATARACT EXTRACTION W/PHACO Left 10/09/2014   Procedure: CATARACT EXTRACTION PHACO AND INTRAOCULAR LENS PLACEMENT LEFT EYE CDE=6.51;  Surgeon: Oneil T. Roz, MD;  Location: [facility];  Service: Ophthalmology;  Laterality: Left;   CHOLECYSTECTOMY  1980   COLONOSCOPY  1999   COLONOSCOPY  March 2007   Repeat 10 years   COLONOSCOPY  11/06/2003    Internal and external hemorrhoids/Two tiny rectal distal sigmoid polyps cold biopsied/Otherwise within normal limits to the cecum   COLONOSCOPY N/A 08/29/2014   Dr. Shaaron: Sessile polyp was found at the cecum status post cold snare polypectomy, tubular adenoma. No further surveillance unless clinical status changes   ESOPHAGOGASTRODUODENOSCOPY  2007   Dr. Shaaron: normal esophagus, small tom oderate sized hiatal hernia, Cameron lesions   SKIN CANCER EXCISION Left 11 1 2016   left arm squamous cancer   TUMOR REMOVAL  03/1968   ovarian    MEDICATIONS:  acetaminophen (TYLENOL) 650 MG CR tablet    albuterol  (VENTOLIN  HFA) 108 (90 Base) MCG/ACT inhaler   alendronate  (FOSAMAX ) 70 MG tablet   ALPRAZolam  (XANAX ) 0.5 MG tablet   Cholecalciferol (VITAMIN D3) 2000 UNITS TABS   ELIQUIS  5 MG TABS tablet   indapamide  (LOZOL ) 1.25 MG tablet   levothyroxine  (SYNTHROID ) 125 MCG tablet   lidocaine -prilocaine  (EMLA ) cream   magnesium gluconate (MAGONATE) 500 (27 Mg) MG TABS tablet   metoprolol  succinate (TOPROL -XL) 25 MG 24 hr tablet   Multiple Vitamin (MULTI-VITAMIN DAILY PO)   Nintedanib (OFEV ) 150 MG CAPS   ondansetron  (ZOFRAN ) 8 MG tablet   ondansetron  (ZOFRAN -ODT) 4 MG disintegrating tablet   pantoprazole  (PROTONIX ) 40 MG tablet   potassium chloride  (KLOR-CON  M) 10 MEQ tablet   prochlorperazine  (COMPAZINE ) 10 MG tablet   sertraline  (ZOLOFT ) 100 MG tablet   No current  facility-administered medications for this encounter.    0.9 %  sodium chloride  infusion    Harlene Hoots Ward, PA-C [facility] [phone]

## 2024-05-16 NOTE — Progress Notes (Incomplete)
 Radiation Oncology         [phone] ________________________________  Name: [name] MRN: [id number]  Date: 05/17/2024  DOB: [birth year]  Re-Evaluation Note  CC: Sheena Brasil, MD  Sheena Essex, MD  No diagnosis found.  Diagnosis: The encounter diagnosis was Vaginal cancer (HCC).   Vaginal cancer (HCC) FIGO Stage IVA (cT4, cN1, cM0)   Narrative:  The patient returns today to discuss radiation treatment options. She was seen in consultation on 05/10/24.   In the interval since she was last seen, patient presented for a follow up with Dr. Marton Sleeper on 05/12/24. At that time, it was indicated that patient is not a candidate for cisplatin due to her severe neuropathy and poor healing, therefore, concurrent chemoradiation therapy with carboplatin was recommended and is scheduled to be initiated on the 20th of June.  Patient is scheduled to undergo a pelvic exam under anesthesia and a cystoscopy on June 18th under the care of Dr. Orvil Bland.   On review of systems, the patient reports ***. She denies *** and any other symptoms.    Allergies:  is allergic to erythromycin, aspirin, and ibuprofen.  Meds: Current Outpatient Medications  Medication Sig Dispense Refill   acetaminophen (TYLENOL) 650 MG CR tablet Take 650 mg by mouth every 8 (eight) hours as needed for pain.     albuterol  (VENTOLIN  HFA) 108 (90 Base) MCG/ACT inhaler Inhale 2 puffs into the lungs every 6 (six) hours as needed for wheezing or shortness of breath. 18 g 1   alendronate  (FOSAMAX ) 70 MG tablet TAKE 1 TABLET(70 MG) BY MOUTH EVERY 7 DAYS WITH A FULL GLASS OF WATER  AND ON AN EMPTY STOMACH 4 tablet 3   ALPRAZolam  (XANAX ) 0.5 MG tablet TAKE 1 TABLET BY MOUTH EVERY NIGHT AT BEDTIME. MAY TAKE 1/2 TABLET DURING THE DAY AS NEEDED 45 tablet 4   Cholecalciferol (VITAMIN D3) 2000 UNITS TABS Take 2,000 Units by mouth daily.     ELIQUIS  5 MG TABS tablet TAKE 1 TABLET(5 MG) BY MOUTH TWICE DAILY. FOLLOW-UP 180 tablet 1    indapamide  (LOZOL ) 1.25 MG tablet Take 1 tablet (1.25 mg total) by mouth every morning. 90 tablet 0   levothyroxine  (SYNTHROID ) 125 MCG tablet TAKE 1 TABLET BY MOUTH DAILY 90 tablet 1   lidocaine -prilocaine (EMLA) cream Apply to affected area once 30 g 3   metoprolol  succinate (TOPROL -XL) 25 MG 24 hr tablet TAKE 1 TABLET BY MOUTH DAILY 90 tablet 1   Multiple Vitamin (MULTI-VITAMIN DAILY PO) Take 1 tablet by mouth daily.     Nintedanib (OFEV ) 150 MG CAPS Take 1 capsule (150 mg total) by mouth 2 (two) times daily. (Patient not taking: Reported on 05/10/2024) 180 capsule 1   ondansetron  (ZOFRAN ) 8 MG tablet Take 1 tablet (8 mg total) by mouth every 8 (eight) hours as needed for nausea or vomiting. Start on the third day after chemotherapy. 30 tablet 1   ondansetron  (ZOFRAN -ODT) 4 MG disintegrating tablet Take 1 tablet (4 mg total) by mouth every 8 (eight) hours as needed. 20 tablet 0   pantoprazole  (PROTONIX ) 40 MG tablet TAKE 1 TABLET(40 MG) BY MOUTH DAILY 90 tablet 1   potassium chloride  (KLOR-CON  M) 10 MEQ tablet TAKE 1 TABLET(10 MEQ) BY MOUTH DAILY 90 tablet 1   prochlorperazine (COMPAZINE) 10 MG tablet Take 1 tablet (10 mg total) by mouth every 6 (six) hours as needed for nausea or vomiting. 30 tablet 1   sertraline  (ZOLOFT ) 100 MG tablet TAKE 1  TABLET BY MOUTH EVERY DAY 90 tablet 1   No current facility-administered medications for this encounter.    Physical Findings: The patient is in no acute distress. Patient is alert and oriented.  vitals were not taken for this visit.  No significant changes. Lungs are clear to auscultation bilaterally. Heart has regular rate and rhythm. No palpable cervical, supraclavicular, or axillary adenopathy. Abdomen soft, non-tender, normal bowel sounds. *** Breast: no palpable mass, nipple discharge or bleeding. *** Breast: ***  Lab Findings: Lab Results  Component Value Date   WBC 13.9 (H) 05/15/2024   HGB 12.5 05/15/2024   HCT 35.6 (L) 05/15/2024    MCV 88.8 05/15/2024   PLT 391 05/15/2024    Radiographic Findings: ECHOCARDIOGRAM COMPLETE Result Date: 05/05/2024    ECHOCARDIOGRAM REPORT   Patient Name:   [name] Date of Exam: 05/05/2024 Medical Rec #:  [id number]       Height:       65.5 in Accession #:    [id number]      Weight:       191.6 lb Date of Birth:  1936/11/10       BSA:          1.953 m Patient Age:    88 years        BP:           138/72 mmHg Patient Gender: F               HR:           99 bpm. Exam Location:  [address] Procedure: 2D Echo, Cardiac Doppler and Color Doppler (Both Spectral and Color            Flow Doppler were utilized during procedure). Indications:    I50.32 CHF  History:        Patient has prior history of Echocardiogram examinations, most                 recent 02/10/2022. CHF, CAD, Pulmonary HTN, Arrythmias:Atrial                 Fibrillation; Risk Factors:Dyslipidemia and Hypertension.  Sonographer:    Lula Sale RDCS Referring Phys: [id number] [place] L [place] IMPRESSIONS  1. Left ventricular ejection fraction, by estimation, is 55 to 60%. The left ventricle has normal function. The left ventricle has no regional wall motion abnormalities. There is mild concentric left ventricular hypertrophy. Left ventricular diastolic function could not be evaluated.  2. Right ventricular systolic function is mildly reduced. The right ventricular size is mildly enlarged. There is normal pulmonary artery systolic pressure. The estimated right ventricular systolic pressure is 35.9 mmHg.  3. Left atrial size was severely dilated.  4. Right atrial size was severely dilated.  5. The mitral valve is abnormal. Mild mitral valve regurgitation. No evidence of mitral stenosis.  6. Tricuspid valve regurgitation is mild to moderate.  7. The aortic valve is tricuspid. There is mild calcification of the aortic valve. Aortic valve regurgitation is not visualized. Aortic valve sclerosis/calcification is present, without any evidence of  aortic stenosis.  8. The inferior vena cava is normal in size with greater than 50% respiratory variability, suggesting right atrial pressure of 3 mmHg. FINDINGS  Left Ventricle: Left ventricular ejection fraction, by estimation, is 55 to 60%. The left ventricle has normal function. The left ventricle has no regional wall motion abnormalities. The left ventricular internal cavity size was normal in size. There is  mild concentric left  ventricular hypertrophy. Left ventricular diastolic function could not be evaluated due to atrial fibrillation. Left ventricular diastolic function could not be evaluated. Right Ventricle: The right ventricular size is mildly enlarged. No increase in right ventricular wall thickness. Right ventricular systolic function is mildly reduced. There is normal pulmonary artery systolic pressure. The tricuspid regurgitant velocity  is 2.87 m/s, and with an assumed right atrial pressure of 3 mmHg, the estimated right ventricular systolic pressure is 35.9 mmHg. Left Atrium: Left atrial size was severely dilated. Right Atrium: Right atrial size was severely dilated. Pericardium: There is no evidence of pericardial effusion. Mitral Valve: The mitral valve is abnormal. Mild mitral annular calcification. Mild mitral valve regurgitation. No evidence of mitral valve stenosis. Tricuspid Valve: The tricuspid valve is normal in structure. Tricuspid valve regurgitation is mild to moderate. No evidence of tricuspid stenosis. Aortic Valve: The aortic valve is tricuspid. There is mild calcification of the aortic valve. Aortic valve regurgitation is not visualized. Aortic valve sclerosis/calcification is present, without any evidence of aortic stenosis. Pulmonic Valve: The pulmonic valve was normal in structure. Pulmonic valve regurgitation is mild. No evidence of pulmonic stenosis. Aorta: The aortic root is normal in size and structure. Venous: The inferior vena cava is normal in size with greater than 50%  respiratory variability, suggesting right atrial pressure of 3 mmHg. IAS/Shunts: No atrial level shunt detected by color flow Doppler.  LEFT VENTRICLE PLAX 2D LVIDd:         3.90 cm   Diastology LVIDs:         2.70 cm   LV e' medial:    6.02 cm/s LV PW:         1.10 cm   LV E/e' medial:  16.6 LV IVS:        1.20 cm   LV e' lateral:   8.27 cm/s LVOT diam:     1.70 cm   LV E/e' lateral: 12.0 LV SV:         35 LV SV Index:   18 LVOT Area:     2.27 cm  RIGHT VENTRICLE            IVC RVSP:           35.9 mmHg  IVC diam: 1.00 cm LEFT ATRIUM              Index        RIGHT ATRIUM           Index LA diam:        4.70 cm  2.41 cm/m   RA Pressure: 3.00 mmHg LA Vol (A2C):   64.9 ml  33.22 ml/m  RA Area:     21.10 cm LA Vol (A4C):   100.0 ml 51.19 ml/m  RA Volume:   57.10 ml  29.23 ml/m LA Biplane Vol: 85.1 ml  43.56 ml/m  AORTIC VALVE LVOT Vmax:   82.70 cm/s LVOT Vmean:  57.640 cm/s LVOT VTI:    0.154 m  AORTA Ao Root diam: 3.20 cm Ao Asc diam:  2.90 cm MITRAL VALVE               TRICUSPID VALVE MV Area (PHT): 3.43 cm    TR Peak grad:   32.9 mmHg MV Decel Time: 221 msec    TR Vmax:        287.00 cm/s MV E velocity: 99.62 cm/s  Estimated RAP:  3.00 mmHg  RVSP:           35.9 mmHg                             SHUNTS                            Systemic VTI:  0.15 m                            Systemic Diam: 1.70 cm Jules Oar MD Electronically signed by Jules Oar MD Signature Date/Time: 05/05/2024/8:10:46 PM    Final    NM PET Image Initial (PI) Whole Body (F-18 FDG) Result Date: 04/26/2024 CLINICAL DATA:  Initial treatment strategy for vaginal cancer. EXAM: NUCLEAR MEDICINE PET WHOLE BODY TECHNIQUE: 9.4 mCi F-18 FDG was injected intravenously. Full-ring PET imaging was performed from the head to foot after the radiotracer. CT data was obtained and used for attenuation correction and anatomic localization. Fasting blood glucose: 105 mg/dl COMPARISON:  Pelvic MRI 04/20/2024 FINDINGS:  Mediastinal blood pool activity: SUV max 3.0 HEAD/NECK: No significant abnormal hypermetabolic activity in this region. Incidental CT findings: none CHEST: No significant abnormal hypermetabolic activity in this region. Incidental CT findings: Aortic and branch vessel atherosclerosis. Moderate cardiomegaly. Mild nonspecific peripheral interstitial accentuation in the lungs, cannot exclude mild interstitial edema. ABDOMEN/PELVIS: Posterior to the urinary bladder along the upper third of the vaginal cuff, a hypermetabolic lesion has maximum SUV of 25.5, compatible with malignancy. A right external iliac node measuring 0.8 cm in short axis on image 184 series 4 has maximum SUV of 6.6, compatible with malignant involvement. Along the anterior margin of an otherwise simple right mid to lower kidney cyst, a 1.2 by 2.3 cm higher density component is present on image 137 of series 4. This could simply reflect blood products of a complex cyst, but I can confidently exclude low-grade metabolic activity. Dedicated renal protocol MRI with and without contrast is recommended for definitive characterization. Incidental CT findings: Hiatal hernia with adjacent postoperative findings under is metal clips in the left upper quadrant. Atherosclerosis is present, including aortoiliac atherosclerotic disease. Right inguinal hernia contains adipose tissue and a loop of small bowel without findings of strangulation or obstruction. SKELETON: No significant abnormal hypermetabolic activity in this region. Incidental CT findings: none EXTREMITIES: No significant abnormal hypermetabolic activity in this region. Incidental CT findings: Incidental fatty components of the medial head gastrocnemius muscle on the right. IMPRESSION: 1. Hypermetabolic lesion along the upper third of the vaginal cuff posterior to the urinary bladder, compatible with malignancy. 2. A 0.8 cm right external iliac node has maximum SUV of 6.6, compatible with malignant  involvement. 3. Along the anterior margin of an otherwise simple right mid to lower kidney cyst, a 1.2 by 2.3 cm higher density component is present. This could simply reflect blood products of a complex cyst, but I can confidently exclude low-grade metabolic activity. Dedicated renal protocol MRI with and without contrast is recommended for definitive characterization, to exclude synchronous renal mass. 4. Right inguinal hernia contains adipose tissue and a loop of small bowel without findings of strangulation or obstruction. 5. Hiatal hernia with adjacent postoperative findings under is metal clips in the left upper quadrant. 6. Moderate cardiomegaly. Mild nonspecific peripheral interstitial accentuation in the lungs, cannot exclude mild interstitial edema. 7.  Aortic Atherosclerosis (ICD10-I70.0). Electronically Signed   By:  Freida Jes M.D.   On: 04/26/2024 21:03   MR PELVIS W WO CONTRAST Result Date: 04/26/2024 CLINICAL DATA:  Vaginal carcinoma. EXAM: MRI PELVIS WITHOUT AND WITH CONTRAST TECHNIQUE: Multiplanar multisequence MR imaging of the pelvis was performed both before and after administration of intravenous contrast. CONTRAST:  9mL GADAVIST  GADOBUTROL  1 MMOL/ML IV SOLN COMPARISON:  None Available. FINDINGS: Lower Urinary Tract: Mild irregular wall thickening is seen involving the bladder base, which is contiguous with the vaginal soft tissue mass described below, suspicious for direct bladder invasion. Bowel: Unremarkable pelvic bowel loops. Vascular/Lymphatic: 9 mm right external iliac lymph node is seen on image 16/3, which is borderline enlarged. No pathologically enlarged lymph nodes identified. Reproductive: -- Uterus: Prior hysterectomy. Concentric masslike wall thickening is seen involving the upper portion of the vaginal cuff, measuring approximately 3.8 x 2.6 x 3.5 cm. Irregular contour is seen with the adjacent parametrial fat bilaterally, consistent with parametrial invasion. No  evidence of distal ureteral involvement or hydroureteronephrosis. -- Right ovary: Not visualized, however no adnexal mass identified. -- Left ovary:  Not visualized, however no adnexal mass identified. Other: No peritoneal thickening or abnormal free fluid. Moderate right inguinal hernia is seen which contains small bowel. No evidence of bowel obstruction or strangulation. Musculoskeletal:  Unremarkable. IMPRESSION: Concentric mass involving the upper portion of the vaginal cuff, with evidence of bilateral parametrial invasion. Mild irregular bladder wall thickening involving the bladder base, which is contiguous with the vaginal soft tissue mass, highly suspicious for direct bladder invasion. Borderline enlarged 9 mm right external iliac lymph node. Consider PET-CT scan for further evaluation. Moderate right inguinal hernia containing small bowel. No evidence of bowel obstruction or strangulation. Electronically Signed   By: Marlyce Sine M.D.   On: 04/26/2024 16:08    Impression: Vaginal cancer (HCC) FIGO Stage IVA (cT4, cN1, cM0)   ***  Plan:  Patient is scheduled for CT simulation {date/later today}. ***  -----------------------------------  Noralee Beam, PhD, MD  This document serves as a record of services personally performed by Retta Caster, MD. It was created on his behalf by Lucky Sable, a trained medical scribe. The creation of this record is based on the scribe's personal observations and the provider's statements to them. This document has been checked and approved by the attending provider.

## 2024-05-16 NOTE — Assessment & Plan Note (Addendum)
 She has hypomagnesemia She is currently taking oral magnesium supplement We discussed importance of taking oral magnesium along with potassium replacement therapy

## 2024-05-16 NOTE — Telephone Encounter (Signed)
 Called [name] and let her know the prior authorization for EMLA cream has been approved.  She verbalized understanding.

## 2024-05-16 NOTE — Progress Notes (Incomplete Revision)
 Anesthesia Chart Review   Case: [id number] Date/Time: 05/17/24 1115   Procedures:      EXAM UNDER ANESTHESIA, PELVIC     CYSTOSCOPY   Anesthesia type: Choice   Diagnosis: Vaginal cancer (HCC) [C52]   Pre-op diagnosis: vaginal cancer   Location: [facility] / [facility]   Surgeons: Suzi Essex, MD       DISCUSSION:88 y.o. never smoker with h/o HTN, CHF, atrial fibrillation, chronic diastolic heart failure, idiopathic pulmonary fibrosis, peripheral neuropathy, locally advanced stage IV vaginal cancer scheduled for above procedure 05/17/2024 with Dr. Wiley Hanger.   Pt last seen by cardiology 04/04/2024. Permanent atrial fibrillation rate controlled on low dose beta blocker, pt is on Eliquis  5 mg daily, remains asymptomatic. Pt euvolemic at this visit, blood pressure adequately controlled. Echo ordered at this visit to evaluate pulmonary HTN. Echo completed 05/05/2024 with EF 55-60%, normal pulmonary artery systolic pressure.   Pt to remain on Eliquis  for procedure per Dr. Orvil Bland.   Pt last seen by pulmonology 11/02/2023. Per OV note repeat CT and PFTs with no progression of disease.   Pt scheduled for port-a-cath placement 05/16/2024 with IR.   Critical potassium at PAT visit, 2.7. Pt currently on Potassium supplementation.   Potassium continues to drop, 2.4 on 05/16/2024.  VS: BP 136/70 Comment: Right arm sitting  Pulse 78   Temp 36.9 C (Oral)   Resp 16   Ht 5' 5 (1.651 m)   Wt 87.9 kg   SpO2 97%   BMI 32.25 kg/m   PROVIDERS: [name], MD is PCP   Maire Scot, MD, is Pulmonology   Cardiologist:  Luana Rumple, MD  LABS: critical potassium discussed with Vira Grieves, NP (all labs ordered are listed, but only abnormal results are displayed)  Labs Reviewed  CBC WITH DIFFERENTIAL/PLATELET - Abnormal; Notable for the following components:      Result Value   WBC 14.8 (*)    Platelets 418 (*)    Neutro Abs 7.9 (*)    Lymphs Abs 5.7 (*)    All other  components within normal limits  COMPREHENSIVE METABOLIC PANEL WITH GFR - Abnormal; Notable for the following components:   Sodium 134 (*)    Potassium 2.7 (*)    Chloride 90 (*)    Glucose, Bld 109 (*)    Creatinine, Ser 1.27 (*)    GFR, Estimated 41 (*)    All other components within normal limits     IMAGES:   EKG:   CV: Echo 05/05/2024 1. Left ventricular ejection fraction, by estimation, is 55 to 60%. The  left ventricle has normal function. The left ventricle has no regional  wall motion abnormalities. There is mild concentric left ventricular  hypertrophy. Left ventricular diastolic  function could not be evaluated.   2. Right ventricular systolic function is mildly reduced. The right  ventricular size is mildly enlarged. There is normal pulmonary artery  systolic pressure. The estimated right ventricular systolic pressure is  35.9 mmHg.   3. Left atrial size was severely dilated.   4. Right atrial size was severely dilated.   5. The mitral valve is abnormal. Mild mitral valve regurgitation. No  evidence of mitral stenosis.   6. Tricuspid valve regurgitation is mild to moderate.   7. The aortic valve is tricuspid. There is mild calcification of the  aortic valve. Aortic valve regurgitation is not visualized. Aortic valve  sclerosis/calcification is present, without any evidence of aortic  stenosis.  8. The inferior vena cava is normal in size with greater than 50%  respiratory variability, suggesting right atrial pressure of 3 mmHg.   Myocardial Perfusion 02/25/2022   The study is normal. The study is low risk.   No ST deviation was noted.   LV perfusion is normal.   Prior study not available for comparison.   Normal perfusion. Study was not gated due to atrial fibrillation. This is a low risk study. No prior for comparison. Past Medical History:  Diagnosis Date   Arthritis    CHF (congestive heart failure) (HCC)    Chronic Leukemia    Depression     Dysrhythmia    A.fib   on Eliquis    H/O hiatal hernia    Hernia    Hernia    right inguinal hernia   Hypertension    Hypothyroidism    Leg swelling    Osteitis pubis (HCC) 05/30/2001   Osteopenia 03/30/2009   Osteopenia 07/30/2022   Bone density 2021 showed elevated FRAX patient was started on Fosamax    Pneumonia    Thyroid  disease     Past Surgical History:  Procedure Laterality Date   ABDOMINAL HYSTERECTOMY  03/1970   APPENDECTOMY  03/1966   BILATERAL SALPINGOOPHORECTOMY     CARDIOVERSION N/A 03/10/2022   Procedure: CARDIOVERSION;  Surgeon: Luana Rumple, MD;  Location: [facility];  Service: Cardiovascular;  Laterality: N/A;   CATARACT EXTRACTION W/PHACO Right 09/25/2014   Procedure: CATARACT EXTRACTION PHACO AND INTRAOCULAR LENS PLACEMENT (IOC);  Surgeon: Lavonia Powers T. Gennie Kicks, MD;  Location: [facility];  Service: Ophthalmology;  Laterality: Right;  CDE:7.56   CATARACT EXTRACTION W/PHACO Left 10/09/2014   Procedure: CATARACT EXTRACTION PHACO AND INTRAOCULAR LENS PLACEMENT LEFT EYE CDE=6.51;  Surgeon: Lavonia Powers T. Gennie Kicks, MD;  Location: [facility];  Service: Ophthalmology;  Laterality: Left;   CHOLECYSTECTOMY  1980   COLONOSCOPY  1999   COLONOSCOPY  March 2007   Repeat 10 years   COLONOSCOPY  11/06/2003    Internal and external hemorrhoids/Two tiny rectal distal sigmoid polyps cold biopsied/Otherwise within normal limits to the cecum   COLONOSCOPY N/A 08/29/2014   Dr. Riley Cheadle: Sessile polyp was found at the cecum status post cold snare polypectomy, tubular adenoma. No further surveillance unless clinical status changes   ESOPHAGOGASTRODUODENOSCOPY  2007   Dr. Riley Cheadle: normal esophagus, small tom oderate sized hiatal hernia, Cameron lesions   SKIN CANCER EXCISION Left 11 1 2016   left arm squamous cancer   TUMOR REMOVAL  03/1968   [facility]    MEDICATIONS:  acetaminophen (TYLENOL) 650 MG CR tablet   albuterol  (VENTOLIN  HFA) 108 (90 Base) MCG/ACT inhaler   alendronate  (FOSAMAX ) 70 MG tablet    ALPRAZolam  (XANAX ) 0.5 MG tablet   Cholecalciferol (VITAMIN D3) 2000 UNITS TABS   ELIQUIS  5 MG TABS tablet   indapamide  (LOZOL ) 1.25 MG tablet   levothyroxine  (SYNTHROID ) 125 MCG tablet   lidocaine -prilocaine (EMLA) cream   magnesium gluconate (MAGONATE) 500 (27 Mg) MG TABS tablet   metoprolol  succinate (TOPROL -XL) 25 MG 24 hr tablet   Multiple Vitamin (MULTI-VITAMIN DAILY PO)   Nintedanib (OFEV ) 150 MG CAPS   ondansetron  (ZOFRAN ) 8 MG tablet   ondansetron  (ZOFRAN -ODT) 4 MG disintegrating tablet   pantoprazole  (PROTONIX ) 40 MG tablet   potassium chloride  (KLOR-CON  M) 10 MEQ tablet   prochlorperazine (COMPAZINE) 10 MG tablet   sertraline  (ZOLOFT ) 100 MG tablet   No current facility-administered medications for this encounter.    0.9 %  sodium chloride  infusion  Chick Cotton Ward, PA-C [facility] [phone]

## 2024-05-16 NOTE — Progress Notes (Signed)
 Removed # 22 saline lock from right ac from short stay today. Dressing applied with coban.

## 2024-05-16 NOTE — Telephone Encounter (Signed)
 Spoke with [facility] and relayed message that patient's surgery has been moved to Wednesday, July 25 th. Brook verbalized understanding and thanked the office for calling.

## 2024-05-16 NOTE — Anesthesia Preprocedure Evaluation (Signed)
 Anesthesia Evaluation  Patient identified by MRN, date of birth, ID band Patient awake    Reviewed: Allergy & Precautions, NPO status , Patient's Chart, lab work & pertinent test results  History of Anesthesia Complications Negative for: history of anesthetic complications  Airway Mallampati: II  TM Distance: >3 FB Neck ROM: Full    Dental no notable dental hx. (+) Teeth Intact, Dental Advisory Given   Pulmonary neg pulmonary ROS Pulm fibrosis   Pulmonary exam normal breath sounds clear to auscultation       Cardiovascular hypertension, +CHF  Normal cardiovascular exam+ dysrhythmias (on eliquis ) Atrial Fibrillation  Rhythm:Regular Rate:Normal  05/05/2024 Echo  1. Left ventricular ejection fraction, by estimation, is 55 to 60%. The  left ventricle has normal function. The left ventricle has no regional  wall motion abnormalities. There is mild concentric left ventricular  hypertrophy. Left ventricular diastolic  function could not be evaluated.   2. Right ventricular systolic function is mildly reduced. The right  ventricular size is mildly enlarged. There is normal pulmonary artery  systolic pressure. The estimated right ventricular systolic pressure is  35.9 mmHg.   3. Left atrial size was severely dilated.   4. Right atrial size was severely dilated.   5. The mitral valve is abnormal. Mild mitral valve regurgitation. No  evidence of mitral stenosis.   6. Tricuspid valve regurgitation is mild to moderate.   7. The aortic valve is tricuspid. There is mild calcification of the  aortic valve. Aortic valve regurgitation is not visualized. Aortic valve  sclerosis/calcification is present, without any evidence of aortic  stenosis.   8. The inferior vena cava is normal in size with greater than 50%  respiratory variability, suggesting right atrial pressure of 3 mmHg.      Neuro/Psych  PSYCHIATRIC DISORDERS  Depression        GI/Hepatic hiatal hernia,GERD  Medicated,,  Endo/Other  Hypothyroidism    Renal/GU Lab Results      Component                Value               Date                                K                        3.2 (L)             05/19/2024                    CREATININE               0.97                05/19/2024                GFRNONAA                 57 (L)              05/19/2024                 GLUCOSE                  101 (H)             05/19/2024  Musculoskeletal  (+) Arthritis ,    Abdominal   Peds  Hematology Lab Results      Component                Value               Date                      WBC                      13.9 (H)            05/15/2024                HGB                      12.5                05/15/2024                HCT                      35.6 (L)            05/15/2024                MCV                      88.8                05/15/2024                PLT                      391                 05/15/2024              Anesthesia Other Findings All: asprin, ibuprofen eryrthromycin  Reproductive/Obstetrics                             Anesthesia Physical Anesthesia Plan  ASA: 3  Anesthesia Plan: General   Post-op Pain Management: Precedex and Ofirmev IV (intra-op)*   Induction: Intravenous  PONV Risk Score and Plan: Treatment may vary due to age or medical condition, Ondansetron , Propofol  infusion and TIVA  Airway Management Planned: LMA  Additional Equipment: None  Intra-op Plan:   Post-operative Plan: Extubation in OR  Informed Consent: I have reviewed the patients History and Physical, chart, labs and discussed the procedure including the risks, benefits and alternatives for the proposed anesthesia with the patient or authorized representative who has indicated his/her understanding and acceptance.     Dental advisory given  Plan Discussed with: CRNA and Surgeon  Anesthesia Plan  Comments: (See PAT note 05/16/2024)       Anesthesia Quick Evaluation

## 2024-05-16 NOTE — Telephone Encounter (Signed)
 Spoke with patient and her grand-daughter Brook.  Patient compliant with pre-operative instructions.  Reinforced nothing to eat after midnight. Clear liquids until 0815. Patient to arrive at 0915.  No questions or concerns voiced.  Instructed to call for any needs.

## 2024-05-17 ENCOUNTER — Encounter: Payer: Self-pay | Admitting: Hematology and Oncology

## 2024-05-17 ENCOUNTER — Telehealth: Payer: Self-pay | Admitting: Oncology

## 2024-05-17 ENCOUNTER — Ambulatory Visit: Admitting: Radiation Oncology

## 2024-05-17 ENCOUNTER — Ambulatory Visit
Admission: RE | Admit: 2024-05-17 | Discharge: 2024-05-17 | Disposition: A | Discharge status: Home / Self Care | Source: Ambulatory Visit | Attending: Radiation Oncology | Admitting: Radiation Oncology

## 2024-05-17 ENCOUNTER — Inpatient Hospital Stay: Admission: RE | Admit: 2024-05-17 | Source: Ambulatory Visit | Admitting: Radiation Oncology

## 2024-05-17 DIAGNOSIS — C52 Malignant neoplasm of vagina: Secondary | ICD-10-CM

## 2024-05-17 NOTE — Telephone Encounter (Signed)
 Called Raeden and let her know that Dr. Marton Sleeper has scheduled her first chemotherapy appontment for Friday, 05/19/2024.  Advised her to arrive at 1:00 to check in because she will have labs first. Also discussed her first chemo will be through a peripheral IV and we will work on getting her port placement appointment rescheduled.  Also left a message for Ruthann Cover, granddaughter.

## 2024-05-17 NOTE — Telephone Encounter (Signed)
 Called [name] and let her know that [name] will start chemo on 05/19/24 and needs to arrive at the cancer center at 1:00 for labs followed by infusion.  Also let her know the port placement has been rescheduled for 05/23/24 at [facility].  She will need to arrive at 12:00 and gave her instructions for NPO after midnight expect for sips of water  with any morning medications and that she will need a driver to pick her up and someone to stay with her for 24 hours afterwards.   [name] also asked if they can move the advanced directive appointment to Friday since [name] will be here for chemo.  Sent a secure chat to [name], CSW to see if appointment can be rescheduled.

## 2024-05-18 ENCOUNTER — Ambulatory Visit
Admission: RE | Admit: 2024-05-18 | Discharge: 2024-05-18 | Disposition: A | Discharge status: Home / Self Care | Source: Ambulatory Visit | Attending: Radiation Oncology | Admitting: Radiation Oncology

## 2024-05-18 ENCOUNTER — Inpatient Hospital Stay: Admitting: Licensed Clinical Social Worker

## 2024-05-18 DIAGNOSIS — C52 Malignant neoplasm of vagina: Secondary | ICD-10-CM | POA: Diagnosis not present

## 2024-05-18 DIAGNOSIS — Z510 Encounter for antineoplastic radiation therapy: Secondary | ICD-10-CM | POA: Insufficient documentation

## 2024-05-19 ENCOUNTER — Inpatient Hospital Stay

## 2024-05-19 ENCOUNTER — Ambulatory Visit: Admitting: Hematology and Oncology

## 2024-05-19 ENCOUNTER — Encounter: Payer: Self-pay | Admitting: Hematology and Oncology

## 2024-05-19 ENCOUNTER — Other Ambulatory Visit

## 2024-05-19 ENCOUNTER — Inpatient Hospital Stay: Admitting: Licensed Clinical Social Worker

## 2024-05-19 VITALS — BP 143/83 | HR 79 | Temp 98.0°F | Resp 17 | Wt 192.5 lb

## 2024-05-19 DIAGNOSIS — Z510 Encounter for antineoplastic radiation therapy: Secondary | ICD-10-CM | POA: Diagnosis not present

## 2024-05-19 DIAGNOSIS — C52 Malignant neoplasm of vagina: Secondary | ICD-10-CM

## 2024-05-19 LAB — BASIC METABOLIC PANEL - CANCER CENTER ONLY
Anion gap: 9 (ref 5–15)
BUN: 15 mg/dL (ref 8–23)
CO2: 33 mmol/L — ABNORMAL HIGH (ref 22–32)
Calcium: 9.3 mg/dL (ref 8.9–10.3)
Chloride: 93 mmol/L — ABNORMAL LOW (ref 98–111)
Creatinine: 0.97 mg/dL (ref 0.44–1.00)
GFR, Estimated: 57 mL/min — ABNORMAL LOW (ref >60)
Glucose, Bld: 101 mg/dL — ABNORMAL HIGH (ref 70–99)
Potassium: 3.2 mmol/L — ABNORMAL LOW (ref 3.5–5.1)
Sodium: 135 mmol/L (ref 135–145)

## 2024-05-19 MED ORDER — SODIUM CHLORIDE 0.9 % IV SOLN: INTRAVENOUS | Status: DC

## 2024-05-19 MED ORDER — SODIUM CHLORIDE 0.9% FLUSH: 10.0000 mL | INTRAVENOUS | Status: DC | PRN

## 2024-05-19 MED ORDER — PALONOSETRON HCL INJECTION 0.25 MG/5ML
0.2500 mg | Freq: Once | INTRAVENOUS | Status: AC
  Administered 2024-05-19: 0.25 mg via INTRAVENOUS
  Filled 2024-05-19: qty 5

## 2024-05-19 MED ORDER — DEXAMETHASONE SODIUM PHOSPHATE 10 MG/ML IJ SOLN
10.0000 mg | Freq: Once | INTRAMUSCULAR | Status: AC
  Administered 2024-05-19: 10 mg via INTRAVENOUS
  Filled 2024-05-19: qty 1

## 2024-05-19 MED ORDER — HEPARIN SOD (PORK) LOCK FLUSH 100 UNIT/ML IV SOLN: 500.0000 [IU] | Freq: Once | INTRAVENOUS | Status: DC | PRN

## 2024-05-19 MED ORDER — SODIUM CHLORIDE 0.9 % IV SOLN
160.8000 mg | Freq: Once | INTRAVENOUS | Status: AC
  Administered 2024-05-19: 160 mg via INTRAVENOUS
  Filled 2024-05-19: qty 16

## 2024-05-19 NOTE — Progress Notes (Signed)
 Per Dr. Marton Sleeper OK to proceed with tx today using labs from 05/15/24.

## 2024-05-19 NOTE — Progress Notes (Signed)
 [facility] Healthcare Advance Directives Clinical Social Work  Patient presented to Advance Directives Clinic  to review and complete healthcare advance directives.  Clinical Child psychotherapist met with patient and granddaughter, Ruthann Cover.  The patient designated Cletus Mehlhoff (granddaughter) as their primary healthcare agent and Murielle Stang (son) as their secondary agent.  Patient also completed healthcare living will.    Documents were notarized and copies made for patient/family. Clinical Social Worker will send documents to medical records to be scanned into patient's chart. Clinical Social Worker encouraged patient/family to contact with any additional questions or concerns.    Michaele Amundson E Liyat Faulkenberry, LCSW Clinical Social Worker [facility]

## 2024-05-19 NOTE — Patient Instructions (Signed)
 [facility] - A DEPT OF [facility]  Discharge Instructions: Thank you for choosing [facility] to provide your oncology and hematology care.   If you have a lab appointment with the Cancer Center, please go directly to the Cancer Center and check in at the registration area.   Wear comfortable clothing and clothing appropriate for easy access to any Portacath or PICC line.   We strive to give you quality time with your provider. You may need to reschedule your appointment if you arrive late (15 or more minutes).  Arriving late affects you and other patients whose appointments are after yours.  Also, if you miss three or more appointments without notifying the office, you may be dismissed from the clinic at the provider's discretion.      For prescription refill requests, have your pharmacy contact our office and allow 72 hours for refills to be completed.    Today you received the following chemotherapy and/or immunotherapy agents: Carboplatin      To help prevent nausea and vomiting after your treatment, we encourage you to take your nausea medication as directed.  BELOW ARE SYMPTOMS THAT SHOULD BE REPORTED IMMEDIATELY: *FEVER GREATER THAN 100.4 F (38 C) OR HIGHER *CHILLS OR SWEATING *NAUSEA AND VOMITING THAT IS NOT CONTROLLED WITH YOUR NAUSEA MEDICATION *UNUSUAL SHORTNESS OF BREATH *UNUSUAL BRUISING OR BLEEDING *URINARY PROBLEMS (pain or burning when urinating, or frequent urination) *BOWEL PROBLEMS (unusual diarrhea, constipation, pain near the anus) TENDERNESS IN MOUTH AND THROAT WITH OR WITHOUT PRESENCE OF ULCERS (sore throat, sores in mouth, or a toothache) UNUSUAL RASH, SWELLING OR PAIN  UNUSUAL VAGINAL DISCHARGE OR ITCHING   Items with * indicate a potential emergency and should be followed up as soon as possible or go to the Emergency Department if any problems should occur.  Please show the CHEMOTHERAPY ALERT CARD or  IMMUNOTHERAPY ALERT CARD at check-in to the Emergency Department and triage nurse.  Should you have questions after your visit or need to cancel or reschedule your appointment, please contact [facility] - A DE[facility]  Dept: [phone]  and follow the prompts.  Office hours are 8:00 a.m. to 4:30 p.m. Monday - Friday. Please note that voicemails left after 4:00 p.m. may not be returned until the following business day.  We are closed weekends and major holidays. You have access to a nurse at all times for urgent questions. Please call the main number to the clinic Dept: [phone] and follow the prompts.   For any non-urgent questions, you may also contact your provider using MyChart. We now offer e-Visits for anyone 55 and older to request care online for non-urgent symptoms. For details visit [email or web address].   Also download the MyChart app! Go to the app store, search "MyChart", open the app, select [facility], and log in with your MyChart username and password.

## 2024-05-21 ENCOUNTER — Other Ambulatory Visit: Payer: Self-pay

## 2024-05-22 ENCOUNTER — Encounter: Payer: Self-pay | Admitting: Hematology and Oncology

## 2024-05-22 ENCOUNTER — Other Ambulatory Visit: Payer: Self-pay | Admitting: Radiology

## 2024-05-22 NOTE — Progress Notes (Addendum)
 Patient phoned to give updated information on surgery.  Date of Surgery - 05-24-24  Arrival Time - 5:15 AM and check in at admitting.    NPO Status - patient reminded to not eat solid food after midnight.  From midnight until 4:30 AM may have clear liquids  Medications morning of surgery - Pantoprazole , Levothyroxine , Metoprolol , Sertraline .  If needed Xanax , Tylenol.  Okay to use Albuterol  inhaler.    Patient to continue Eliquis .  No change in medical history, allergies per patient.  Transportation home - [facility] [phone]  All questions answered and patient stated understanding

## 2024-05-22 NOTE — Telephone Encounter (Signed)
 I called the pt and let her know to stay off OFEV  until pending ov  She verbalized understanding  Nothing further needed

## 2024-05-22 NOTE — Telephone Encounter (Signed)
-----   Message from Nurse Elenor ORN sent at 05/19/2024  3:32 PM EDT ----- Regarding: Dr. Lonn 1st time [facility] f/u Dr. Lonn 1st time [facility] Pt tolerated tx well without incident. Pt call back due.

## 2024-05-22 NOTE — Telephone Encounter (Signed)
 Called pt to see how she did with her recent treatment.  She reports doing well & denies any concerns.  She knows about port insertion tomorrow & has instructions.  She knows to call with any concerns.  She knows her appts.

## 2024-05-23 ENCOUNTER — Telehealth: Payer: Self-pay | Admitting: *Deleted

## 2024-05-23 ENCOUNTER — Encounter (HOSPITAL_COMMUNITY): Payer: Self-pay

## 2024-05-23 ENCOUNTER — Ambulatory Visit (HOSPITAL_COMMUNITY)
Admission: RE | Admit: 2024-05-23 | Discharge: 2024-05-23 | Disposition: A | Discharge status: Home / Self Care | Source: Ambulatory Visit | Attending: Hematology and Oncology | Admitting: Hematology and Oncology

## 2024-05-23 DIAGNOSIS — C52 Malignant neoplasm of vagina: Secondary | ICD-10-CM | POA: Insufficient documentation

## 2024-05-23 DIAGNOSIS — Z510 Encounter for antineoplastic radiation therapy: Secondary | ICD-10-CM | POA: Diagnosis not present

## 2024-05-23 HISTORY — PX: IR IMAGING GUIDED PORT INSERTION: IMG5740

## 2024-05-23 MED ORDER — FENTANYL CITRATE (PF) 100 MCG/2ML IJ SOLN
INTRAMUSCULAR | Status: AC
Start: 2024-05-23 — End: 2024-05-23
  Filled 2024-05-23: qty 2

## 2024-05-23 MED ORDER — FENTANYL CITRATE (PF) 100 MCG/2ML IJ SOLN
INTRAMUSCULAR | Status: AC | PRN
  Administered 2024-05-23 (×2): 25 ug via INTRAVENOUS

## 2024-05-23 MED ORDER — LIDOCAINE-EPINEPHRINE 1 %-1:100000 IJ SOLN
INTRAMUSCULAR | Status: AC
Start: 2024-05-23 — End: 2024-05-23
  Filled 2024-05-23: qty 1

## 2024-05-23 MED ORDER — SODIUM CHLORIDE 0.9 % IV SOLN: INTRAVENOUS | Status: DC

## 2024-05-23 MED ORDER — LIDOCAINE HCL 1 % IJ SOLN
20.0000 mL | Freq: Once | INTRAMUSCULAR | Status: AC
  Administered 2024-05-23: 10 mL

## 2024-05-23 MED ORDER — HEPARIN SOD (PORK) LOCK FLUSH 100 UNIT/ML IV SOLN
500.0000 [IU] | Freq: Once | INTRAVENOUS | Status: AC
  Administered 2024-05-23: 500 [IU] via INTRAVENOUS

## 2024-05-23 MED ORDER — LIDOCAINE-EPINEPHRINE 1 %-1:100000 IJ SOLN
20.0000 mL | Freq: Once | INTRAMUSCULAR | Status: AC
  Administered 2024-05-23: 10 mL

## 2024-05-23 MED ORDER — HEPARIN SOD (PORK) LOCK FLUSH 100 UNIT/ML IV SOLN
INTRAVENOUS | Status: AC
  Filled 2024-05-23: qty 5

## 2024-05-23 MED ORDER — MIDAZOLAM HCL 2 MG/2ML IJ SOLN
INTRAMUSCULAR | Status: AC
Start: 2024-05-23 — End: 2024-05-23
  Filled 2024-05-23: qty 2

## 2024-05-23 MED ORDER — MIDAZOLAM HCL 2 MG/2ML IJ SOLN
INTRAMUSCULAR | Status: AC | PRN
  Administered 2024-05-23: .5 mg via INTRAVENOUS
  Administered 2024-05-23: 1 mg via INTRAVENOUS

## 2024-05-23 MED ORDER — LIDOCAINE HCL 1 % IJ SOLN
INTRAMUSCULAR | Status: AC
Start: 2024-05-23 — End: 2024-05-23
  Filled 2024-05-23: qty 20

## 2024-05-23 NOTE — Telephone Encounter (Signed)
 2nd attempt to reach patient for her pre-op call. Spoke with Bobie, patient's grand daughter and reviewed pre-op instructions. Will attempt to call patient back this afternoon.

## 2024-05-23 NOTE — Procedures (Signed)
 Interventional Radiology Procedure:   Indications: Vaginal cancer  Procedure: Port placement  Findings: Right jugular port, tip at SVC/RA junction.    Complications: None     EBL: Minimal, less than 10 ml  Plan: Discharge in one hour.  Keep port site and incisions dry for at least 24 hours.     Jiovanny Burdell R. Philip, MD  Pager: [phone]

## 2024-05-23 NOTE — Telephone Encounter (Signed)
Attempted to reach patient for pre-op call. Left voicemail requesting call back to [phone].

## 2024-05-23 NOTE — Telephone Encounter (Signed)
 Telephone call to check on pre-operative status.  Patient compliant with pre-operative instructions.  Reinforced nothing to eat after midnight. Clear liquids until 0415. Patient to arrive at 0515.  No questions or concerns voiced.  Instructed to call for any needs.

## 2024-05-23 NOTE — H&P (Addendum)
 Chief Complaint: Patient was seen in consultation today for Vaginal cancer- for [facility] a cath placement at the request of Gorsuch,Ni  Referring Physician(s): Gorsuch,Ni  Supervising Physician: Philip Cornet  Patient Status: [facility] - Out-pt  History of Present Illness: [name] is a 88 y.o. female   FULL Code status per pt Vaginal cancer Follows with Dr Lonn Scheduled for [facility] a cath placement To start chemo therapy next week    Past Medical History:  Diagnosis Date   Arthritis    CHF (congestive heart failure) (HCC)    Chronic Leukemia    Depression    Dysrhythmia    A.fib   on Eliquis    H/O hiatal hernia    Hernia    Hernia    right inguinal hernia   Hypertension    Hypothyroidism    Leg swelling    Osteitis pubis (HCC) 05/30/2001   Osteopenia 03/30/2009   Osteopenia 07/30/2022   Bone density 2021 showed elevated FRAX patient was started on Fosamax    Pneumonia    Thyroid  disease     Past Surgical History:  Procedure Laterality Date   ABDOMINAL HYSTERECTOMY  03/1970   APPENDECTOMY  03/1966   BILATERAL SALPINGOOPHORECTOMY     CARDIOVERSION N/A 03/10/2022   Procedure: CARDIOVERSION;  Surgeon: Francyne Headland, MD;  Location: [facility] END[facility];  Service: Cardiovascular;  Laterality: N/A;   CATARACT EXTRACTION W/PHACO Right 09/25/2014   Procedure: CATARACT EXTRACTION PHACO AND INTRAOCULAR LENS PLACEMENT (IOC);  Surgeon: Oneil T. Roz, MD;  Location: [facility] ORS;  Service: Ophthalmology;  Laterality: Right;  CDE:7.56   CATARACT EXTRACTION W/PHACO Left 10/09/2014   Procedure: CATARACT EXTRACTION PHACO AND INTRAOCULAR LENS PLACEMENT LEFT EYE CDE=6.51;  Surgeon: Oneil T. Roz, MD;  Location: [facility] ORS;  Service: Ophthalmology;  Laterality: Left;   CHOLECYSTECTOMY  1980   COLONOSCOPY  1999   COLONOSCOPY  March 2007   Repeat 10 years   COLONOSCOPY  11/06/2003    Internal and external hemorrhoids/Two tiny rectal distal sigmoid polyps cold biopsied/Otherwise within normal  limits to the cecum   COLONOSCOPY N/A 08/29/2014   Dr. Shaaron: Sessile polyp was found at the cecum status post cold snare polypectomy, tubular adenoma. No further surveillance unless clinical status changes   ESOPHAGOGASTRODUODENOSCOPY  2007   Dr. Shaaron: normal esophagus, small tom oderate sized hiatal hernia, Cameron lesions   SKIN CANCER EXCISION Left 11 1 2016   left arm squamous cancer   TUMOR REMOVAL  03/1968   ovarian    Allergies: Erythromycin, Aspirin, and Ibuprofen  Medications: Prior to Admission medications   Medication Sig Start Date End Date Taking? Authorizing Provider  acetaminophen (TYLENOL) 650 MG CR tablet Take 650 mg by mouth every 8 (eight) hours as needed for pain.   Yes [provider]  albuterol  (VENTOLIN  HFA) 108 (90 Base) MCG/ACT inhaler Inhale 2 puffs into the lungs every 6 (six) hours as needed for wheezing or shortness of breath. 03/08/24  Yes Grooms, Courtney, PA-C  alendronate  (FOSAMAX ) 70 MG tablet TAKE 1 TABLET(70 MG) BY MOUTH EVERY 7 DAYS WITH A FULL GLASS OF WATER  AND ON AN EMPTY STOMACH 08/16/23  Yes Luking, Scott A, MD  ALPRAZolam  (XANAX ) 0.5 MG tablet TAKE 1 TABLET BY MOUTH EVERY NIGHT AT BEDTIME. MAY TAKE 1/2 TABLET DURING THE DAY AS NEEDED 04/04/24  Yes Luking, Scott A, MD  ELIQUIS  5 MG TABS tablet TAKE 1 TABLET(5 MG) BY MOUTH TWICE DAILY. FOLLOW-UP 03/30/24  Yes [name], Mihai, MD  indapamide  (LOZOL ) 1.25  MG tablet Take 1 tablet (1.25 mg total) by mouth every morning. 02/11/24  Yes [name], MD  levothyroxine  (SYNTHROID ) 125 MCG tablet TAKE 1 TABLET BY MOUTH DAILY 02/14/24  Yes Luking, Glendia LABOR, MD  magnesium gluconate (MAGONATE) 500 (27 Mg) MG TABS tablet Take 500 mg by mouth daily at 6 (six) AM.   Yes [provider]  metoprolol  succinate (TOPROL -XL) 25 MG 24 hr tablet TAKE 1 TABLET BY MOUTH DAILY 01/18/24  Yes Luking, Glendia LABOR, MD  Multiple Vitamin (MULTI-VITAMIN DAILY PO) Take 1 tablet by mouth daily.   Yes [provider]   Nintedanib (OFEV ) 150 MG CAPS Take 1 capsule (150 mg total) by mouth 2 (two) times daily. 11/02/23  Yes Meade Verdon RAMAN, MD  pantoprazole  (PROTONIX ) 40 MG tablet TAKE 1 TABLET(40 MG) BY MOUTH DAILY 10/21/23  Yes [name], MD  potassium chloride  (KLOR-CON  M) 10 MEQ tablet TAKE 1 TABLET(10 MEQ) BY MOUTH DAILY 05/05/24  Yes [name], MD  sertraline  (ZOLOFT ) 100 MG tablet TAKE 1 TABLET BY MOUTH EVERY DAY 02/21/24  Yes [name], MD  Cholecalciferol (VITAMIN D3) 2000 UNITS TABS Take 2,000 Units by mouth daily.    [provider]  lidocaine -prilocaine  (EMLA ) cream Apply to affected area once 05/12/24   Lonn Hicks, MD  ondansetron  (ZOFRAN ) 8 MG tablet Take 1 tablet (8 mg total) by mouth every 8 (eight) hours as needed for nausea or vomiting. Start on the third day after chemotherapy. 05/12/24   Lonn Hicks, MD  ondansetron  (ZOFRAN -ODT) 4 MG disintegrating tablet Take 1 tablet (4 mg total) by mouth every 8 (eight) hours as needed. 05/06/24   Leath-Warren, [name], NP  prochlorperazine  (COMPAZINE ) 10 MG tablet Take 1 tablet (10 mg total) by mouth every 6 (six) hours as needed for nausea or vomiting. 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   Widowed since 08-09-21. Husband W.E. Clinard, died from Kidney Cancer.   Son-Tommy lives nearby.   2 grandson's deceased, Clint and Penne. Penne passed 02/2022 from Brain Cancer.    Social Drivers of Corporate investment banker Strain: Low Risk  (  04/05/2024)   Overall Financial Resource Strain (CARDIA)    Difficulty of Paying Living Expenses: Not hard at all  Food Insecurity: No Food Insecurity (04/05/2024)   Hunger Vital Sign    Worried About Running Out of Food in the Last Year: Never true    Ran Out of Food in the Last Year: Never true  Transportation Needs: No Transportation Needs (04/05/2024)   PRAPARE - Administrator, Civil Service (Medical): No    Lack of Transportation (Non-Medical): No  Physical Activity: Insufficiently Active (04/05/2024)   Exercise Vital Sign    Days of Exercise per Week: 2 days    Minutes of Exercise per Session: 10 min  Stress: No Stress Concern Present (04/05/2024)   [employer] of Occupational Health - Occupational Stress Questionnaire    Feeling of Stress : Only a little  Social Connections: Moderately Integrated (04/05/2024)   Social Connection and Isolation Panel    Frequency of Communication with Friends and Family: More than three times a week    Frequency of Social Gatherings with Friends and Family: Twice a week    Attends Religious Services: More than 4 times per year    Active Member of [employer] or Organizations: Yes    Attends Banker Meetings: More than 4 times per year    Marital Status: Widowed    Review of Systems: A 12 point ROS discussed and pertinent positives are indicated in the HPI above.  All other systems are negative.  Review of Systems   Constitutional:  Negative for activity change and fever.  Respiratory:  Negative for cough and shortness of breath.   Cardiovascular:  Negative for chest pain.  Gastrointestinal:  Negative for abdominal pain.  Psychiatric/Behavioral:  Negative for behavioral problems and confusion.     Vital Signs: BP 139/71   Pulse 84   Temp (!) 97.4 F (36.3 C) (Oral)   Resp 17   Ht 5' 5 (1.651 m)   Wt 192 lb (87.1 kg)   SpO2 96%   BMI 31.95 kg/m   Advance Care Plan: The advanced care plan/surrogate decision maker was discussed at the time of visit and documented in the medical record.    Physical Exam Vitals reviewed.  HENT:     Mouth/Throat:     Mouth: Mucous membranes are moist.   Cardiovascular:     Rate and Rhythm: Normal rate. Rhythm irregular.     Heart sounds: No murmur heard. Pulmonary:     Effort: Pulmonary effort is normal.     Breath sounds: Normal breath sounds. No wheezing.  Abdominal:     Palpations: Abdomen is soft.   Musculoskeletal:        General: Normal range of motion.   Skin:    General: Skin is warm.   Neurological:     Mental Status: She is alert and oriented to person, place, and time.   Psychiatric:        Behavior: Behavior normal.     Imaging: ECHOCARDIOGRAM COMPLETE Result Date: 05/05/2024    ECHOCARDIOGRAM REPORT   Patient Name:   [name] Date of Exam: 05/05/2024 Medical Rec #:  [id number]       Height:       65.5 in Accession #:    [id number]      Weight:       191.6 lb Date of Birth:  15-Jan-1936       BSA:  1.953 m Patient Age:    88 years        BP:           138/72 mmHg Patient Gender: F               HR:           99 bpm. Exam Location:  [address] Procedure: 2D Echo, Cardiac Doppler and Color Doppler (Both Spectral and Color            Flow Doppler were utilized during procedure). Indications:    I50.32 CHF  History:        Patient has prior history of Echocardiogram examinations, most                 recent 02/10/2022. CHF,  CAD, Pulmonary HTN, Arrythmias:Atrial                 Fibrillation; Risk Factors:Dyslipidemia and Hypertension.  Sonographer:    Elsie Bohr RDCS Referring Phys: [id number] [place] [facility] IMPRESSIONS  1. Left ventricular ejection fraction, by estimation, is 55 to 60%. The left ventricle has normal function. The left ventricle has no regional wall motion abnormalities. There is mild concentric left ventricular hypertrophy. Left ventricular diastolic function could not be evaluated.  2. Right ventricular systolic function is mildly reduced. The right ventricular size is mildly enlarged. There is normal pulmonary artery systolic pressure. The estimated right ventricular systolic pressure is 35.9 mmHg.  3. Left atrial size was severely dilated.  4. Right atrial size was severely dilated.  5. The mitral valve is abnormal. Mild mitral valve regurgitation. No evidence of mitral stenosis.  6. Tricuspid valve regurgitation is mild to moderate.  7. The aortic valve is tricuspid. There is mild calcification of the aortic valve. Aortic valve regurgitation is not visualized. Aortic valve sclerosis/calcification is present, without any evidence of aortic stenosis.  8. The inferior vena cava is normal in size with greater than 50% respiratory variability, suggesting right atrial pressure of 3 mmHg. FINDINGS  Left Ventricle: Left ventricular ejection fraction, by estimation, is 55 to 60%. The left ventricle has normal function. The left ventricle has no regional wall motion abnormalities. The left ventricular internal cavity size was normal in size. There is  mild concentric left ventricular hypertrophy. Left ventricular diastolic function could not be evaluated due to atrial fibrillation. Left ventricular diastolic function could not be evaluated. Right Ventricle: The right ventricular size is mildly enlarged. No increase in right ventricular wall thickness. Right ventricular systolic function is mildly reduced. There is  normal pulmonary artery systolic pressure. The tricuspid regurgitant velocity  is 2.87 m/s, and with an assumed right atrial pressure of 3 mmHg, the estimated right ventricular systolic pressure is 35.9 mmHg. Left Atrium: Left atrial size was severely dilated. Right Atrium: Right atrial size was severely dilated. Pericardium: There is no evidence of pericardial effusion. Mitral Valve: The mitral valve is abnormal. Mild mitral annular calcification. Mild mitral valve regurgitation. No evidence of mitral valve stenosis. Tricuspid Valve: The tricuspid valve is normal in structure. Tricuspid valve regurgitation is mild to moderate. No evidence of tricuspid stenosis. Aortic Valve: The aortic valve is tricuspid. There is mild calcification of the aortic valve. Aortic valve regurgitation is not visualized. Aortic valve sclerosis/calcification is present, without any evidence of aortic stenosis. Pulmonic Valve: The pulmonic valve was normal in structure. Pulmonic valve regurgitation is mild. No evidence of pulmonic stenosis. Aorta: The aortic root is normal in size and structure. Venous: The  inferior vena cava is normal in size with greater than 50% respiratory variability, suggesting right atrial pressure of 3 mmHg. IAS/Shunts: No atrial level shunt detected by color flow Doppler.  LEFT VENTRICLE PLAX 2D LVIDd:         3.90 cm   Diastology LVIDs:         2.70 cm   LV e' medial:    6.02 cm/s LV PW:         1.10 cm   LV E/e' medial:  16.6 LV IVS:        1.20 cm   LV e' lateral:   8.27 cm/s LVOT diam:     1.70 cm   LV E/e' lateral: 12.0 LV SV:         35 LV SV Index:   18 LVOT Area:     2.27 cm  RIGHT VENTRICLE            IVC RVSP:           35.9 mmHg  IVC diam: 1.00 cm LEFT ATRIUM              Index        RIGHT ATRIUM           Index LA diam:        4.70 cm  2.41 cm/m   RA Pressure: 3.00 mmHg LA Vol (A2C):   64.9 ml  33.22 ml/m  RA Area:     21.10 cm LA Vol (A4C):   100.0 ml 51.19 ml/m  RA Volume:   57.10 ml  29.23  ml/m LA Biplane Vol: 85.1 ml  43.56 ml/m  AORTIC VALVE LVOT Vmax:   82.70 cm/s LVOT Vmean:  57.640 cm/s LVOT VTI:    0.154 m  AORTA Ao Root diam: 3.20 cm Ao Asc diam:  2.90 cm MITRAL VALVE               TRICUSPID VALVE MV Area (PHT): 3.43 cm    TR Peak grad:   32.9 mmHg MV Decel Time: 221 msec    TR Vmax:        287.00 cm/s MV E velocity: 99.62 cm/s  Estimated RAP:  3.00 mmHg                            RVSP:           35.9 mmHg                             SHUNTS                            Systemic VTI:  0.15 m                            Systemic Diam: 1.70 cm Toribio Fuel MD Electronically signed by Toribio Fuel MD Signature Date/Time: 05/05/2024/8:10:46 PM    Final     Labs:  CBC: Recent Labs    12/09/23 1010 04/28/24 1321 05/15/24 1001 05/15/24 1349  WBC 12.7* 14.9* 14.8* 13.9*  HGB 13.2 12.8 13.2 12.5  HCT 39.7 38.0 40.1 35.6*  PLT 377 383 418* 391    COAGS: No results for input(s): INR, APTT in the last 8760 hours.  BMP: Recent Labs    05/15/24 1001 05/15/24 1349  05/16/24 0920 05/19/24 1325  NA 134* 136 137 135  K 2.7* 2.4* 2.3* 3.2*  CL 90* 91* 89* 93*  CO2 30 33* 33* 33*  GLUCOSE 109* 159* 102* 101*  BUN 21 22 20 15   CALCIUM 9.3 9.3 9.0 9.3  CREATININE 1.27* 1.22* 1.11* 0.97  GFRNONAA 41* 43* 48* 57*    LIVER FUNCTION TESTS: Recent Labs    08/17/23 1111 12/09/23 1010 04/28/24 1321 05/15/24 1001  BILITOT 0.3 0.5 0.5 1.2  AST 22 17 22 27   ALT 12 9 11 13   ALKPHOS 87 62 71 61  PROT 7.1 7.5 7.6 7.9  ALBUMIN 4.1 4.0 3.7 3.9    TUMOR MARKERS: No results for input(s): AFPTM, CEA, CA199, CHROMGRNA in the last 8760 hours.  Assessment and Plan:  Scheduled for Port a cath placement Risks and benefits of image guided port-a-catheter placement was discussed with the patient including, but not limited to bleeding, infection, pneumothorax, or fibrin sheath development and need for additional procedures.  All of the patient's questions were  answered, patient is agreeable to proceed. Consent signed and in chart.  Thank you for this interesting consult.  I greatly enjoyed meeting SADAE ARRAZOLA and look forward to participating in their care.  A copy of this report was sent to the requesting provider on this date.  Electronically Signed: Sharlet DELENA Candle, PA-C 05/23/2024, 2:31 PM   I spent a total of  30 Minutes   in face to face in clinical consultation, greater than 50% of which was counseling/coordinating care for [facility] a cath placement

## 2024-05-24 ENCOUNTER — Ambulatory Visit (HOSPITAL_COMMUNITY)
Admission: RE | Admit: 2024-05-24 | Discharge: 2024-05-24 | Disposition: A | Discharge status: Home / Self Care | Attending: Gynecologic Oncology | Admitting: Gynecologic Oncology

## 2024-05-24 ENCOUNTER — Other Ambulatory Visit: Payer: Self-pay

## 2024-05-24 ENCOUNTER — Ambulatory Visit (HOSPITAL_COMMUNITY): Payer: Self-pay | Admitting: Physician Assistant

## 2024-05-24 ENCOUNTER — Encounter (HOSPITAL_COMMUNITY): Payer: Self-pay | Admitting: Gynecologic Oncology

## 2024-05-24 ENCOUNTER — Encounter (HOSPITAL_COMMUNITY)
Admission: RE | Disposition: A | Discharge status: Home / Self Care | Payer: Self-pay | Source: Home / Self Care | Attending: Gynecologic Oncology

## 2024-05-24 ENCOUNTER — Encounter: Payer: Self-pay | Admitting: Oncology

## 2024-05-24 DIAGNOSIS — E876 Hypokalemia: Secondary | ICD-10-CM

## 2024-05-24 DIAGNOSIS — Z7901 Long term (current) use of anticoagulants: Secondary | ICD-10-CM | POA: Insufficient documentation

## 2024-05-24 DIAGNOSIS — D47Z9 Other specified neoplasms of uncertain behavior of lymphoid, hematopoietic and related tissue: Secondary | ICD-10-CM | POA: Insufficient documentation

## 2024-05-24 DIAGNOSIS — J84112 Idiopathic pulmonary fibrosis: Secondary | ICD-10-CM | POA: Insufficient documentation

## 2024-05-24 DIAGNOSIS — I5032 Chronic diastolic (congestive) heart failure: Secondary | ICD-10-CM | POA: Diagnosis not present

## 2024-05-24 DIAGNOSIS — I4891 Unspecified atrial fibrillation: Secondary | ICD-10-CM | POA: Insufficient documentation

## 2024-05-24 DIAGNOSIS — I110 Hypertensive heart disease with heart failure: Secondary | ICD-10-CM

## 2024-05-24 DIAGNOSIS — C52 Malignant neoplasm of vagina: Secondary | ICD-10-CM | POA: Diagnosis not present

## 2024-05-24 DIAGNOSIS — Z90710 Acquired absence of both cervix and uterus: Secondary | ICD-10-CM | POA: Diagnosis not present

## 2024-05-24 DIAGNOSIS — K449 Diaphragmatic hernia without obstruction or gangrene: Secondary | ICD-10-CM | POA: Insufficient documentation

## 2024-05-24 DIAGNOSIS — I509 Heart failure, unspecified: Secondary | ICD-10-CM | POA: Insufficient documentation

## 2024-05-24 DIAGNOSIS — I10 Essential (primary) hypertension: Secondary | ICD-10-CM

## 2024-05-24 DIAGNOSIS — N393 Stress incontinence (female) (male): Secondary | ICD-10-CM | POA: Insufficient documentation

## 2024-05-24 DIAGNOSIS — K219 Gastro-esophageal reflux disease without esophagitis: Secondary | ICD-10-CM | POA: Insufficient documentation

## 2024-05-24 DIAGNOSIS — K5900 Constipation, unspecified: Secondary | ICD-10-CM | POA: Diagnosis not present

## 2024-05-24 DIAGNOSIS — I4819 Other persistent atrial fibrillation: Secondary | ICD-10-CM

## 2024-05-24 DIAGNOSIS — E039 Hypothyroidism, unspecified: Secondary | ICD-10-CM | POA: Insufficient documentation

## 2024-05-24 HISTORY — PX: EXAM UNDER ANESTHESIA, PELVIC: SHX7461

## 2024-05-24 HISTORY — PX: CYSTOSCOPY: SHX5120

## 2024-05-24 LAB — BASIC METABOLIC PANEL WITH GFR
Anion gap: 9 (ref 5–15)
BUN: 17 mg/dL (ref 8–23)
CO2: 29 mmol/L (ref 22–32)
Calcium: 8.4 mg/dL — ABNORMAL LOW (ref 8.9–10.3)
Chloride: 92 mmol/L — ABNORMAL LOW (ref 98–111)
Creatinine, Ser: 1.13 mg/dL — ABNORMAL HIGH (ref 0.44–1.00)
GFR, Estimated: 47 mL/min — ABNORMAL LOW (ref >60)
Glucose, Bld: 110 mg/dL — ABNORMAL HIGH (ref 70–99)
Potassium: 3.7 mmol/L (ref 3.5–5.1)
Sodium: 130 mmol/L — ABNORMAL LOW (ref 135–145)

## 2024-05-24 SURGERY — EXAM UNDER ANESTHESIA, PELVIC
Anesthesia: General

## 2024-05-24 MED ORDER — LIDOCAINE 2% (20 MG/ML) 5 ML SYRINGE
INTRAMUSCULAR | Status: DC | PRN
Start: 2024-05-24 — End: 2024-05-24
  Administered 2024-05-24: 80 mg via INTRAVENOUS

## 2024-05-24 MED ORDER — ONDANSETRON HCL 4 MG/2ML IJ SOLN
INTRAMUSCULAR | Status: DC | PRN
  Administered 2024-05-24: 4 mg via INTRAVENOUS

## 2024-05-24 MED ORDER — DEXAMETHASONE SODIUM PHOSPHATE 10 MG/ML IJ SOLN: 4.0000 mg | INTRAMUSCULAR | Status: DC

## 2024-05-24 MED ORDER — DEXAMETHASONE SODIUM PHOSPHATE 10 MG/ML IJ SOLN
INTRAMUSCULAR | Status: DC | PRN
  Administered 2024-05-24: 5 mg via INTRAVENOUS

## 2024-05-24 MED ORDER — SODIUM CHLORIDE 0.9 % IR SOLN
Status: DC | PRN
  Administered 2024-05-24: 3000 mL

## 2024-05-24 MED ORDER — CHLORHEXIDINE GLUCONATE 0.12 % MT SOLN
15.0000 mL | Freq: Once | OROMUCOSAL | Status: AC
  Administered 2024-05-24: 15 mL via OROMUCOSAL

## 2024-05-24 MED ORDER — DEXAMETHASONE SODIUM PHOSPHATE 10 MG/ML IJ SOLN
INTRAMUSCULAR | Status: AC
  Filled 2024-05-24: qty 1

## 2024-05-24 MED ORDER — PROPOFOL 10 MG/ML IV BOLUS
INTRAVENOUS | Status: AC
  Filled 2024-05-24: qty 20

## 2024-05-24 MED ORDER — FENTANYL CITRATE (PF) 100 MCG/2ML IJ SOLN
INTRAMUSCULAR | Status: AC
  Filled 2024-05-24: qty 2

## 2024-05-24 MED ORDER — DEXMEDETOMIDINE HCL IN NACL 200 MCG/50ML IV SOLN
INTRAVENOUS | Status: DC | PRN
  Administered 2024-05-24: 4 ug via INTRAVENOUS

## 2024-05-24 MED ORDER — LACTATED RINGERS IV SOLN: INTRAVENOUS | Status: DC

## 2024-05-24 MED ORDER — FENTANYL CITRATE PF 50 MCG/ML IJ SOSY: 25.0000 ug | PREFILLED_SYRINGE | INTRAMUSCULAR | Status: DC | PRN

## 2024-05-24 MED ORDER — PROPOFOL 10 MG/ML IV BOLUS
INTRAVENOUS | Status: DC | PRN
  Administered 2024-05-24: 150 mg via INTRAVENOUS

## 2024-05-24 MED ORDER — FENTANYL CITRATE (PF) 100 MCG/2ML IJ SOLN
INTRAMUSCULAR | Status: DC | PRN
  Administered 2024-05-24: 25 ug via INTRAVENOUS

## 2024-05-24 MED ORDER — ACETAMINOPHEN 500 MG PO TABS
1000.0000 mg | ORAL_TABLET | ORAL | Status: AC
  Administered 2024-05-24: 1000 mg via ORAL
  Filled 2024-05-24: qty 2

## 2024-05-24 MED ORDER — PROPOFOL 500 MG/50ML IV EMUL
INTRAVENOUS | Status: DC | PRN
  Administered 2024-05-24: 100 ug/kg/min via INTRAVENOUS

## 2024-05-24 MED ORDER — ONDANSETRON HCL 4 MG/2ML IJ SOLN
INTRAMUSCULAR | Status: AC
  Filled 2024-05-24: qty 2

## 2024-05-24 MED ORDER — ONDANSETRON HCL 4 MG/2ML IJ SOLN: 4.0000 mg | Freq: Once | INTRAMUSCULAR | Status: DC | PRN

## 2024-05-24 MED ORDER — ORAL CARE MOUTH RINSE: 15.0000 mL | Freq: Once | OROMUCOSAL | Status: AC

## 2024-05-24 MED ORDER — LACTATED RINGERS IV SOLN: INTRAVENOUS | Status: DC | PRN

## 2024-05-24 MED ORDER — ACETAMINOPHEN 10 MG/ML IV SOLN: 1000.0000 mg | Freq: Once | INTRAVENOUS | Status: DC | PRN

## 2024-05-24 SURGICAL SUPPLY — 14 items
BLADE SURG 15 STRL LF DISP TIS (BLADE) IMPLANT
BLADE SURG SZ11 CARB STEEL (BLADE) IMPLANT
CATH ROBINSON RED A/P 16FR (CATHETERS) IMPLANT
GAUZE 4X4 16PLY ~~LOC~~+RFID DBL (SPONGE) ×2 IMPLANT
GLOVE BIO SURGEON STRL SZ 6 (GLOVE) ×4 IMPLANT
KIT TURNOVER KIT A (KITS) ×2 IMPLANT
NDL HYPO 25X1 1.5 SAFETY (NEEDLE) IMPLANT
NEEDLE HYPO 25X1 1.5 SAFETY (NEEDLE) IMPLANT
PACK PERINEAL COLD (PAD) ×2 IMPLANT
PACK VAGINAL WOMENS (CUSTOM PROCEDURE TRAY) ×2 IMPLANT
SYR BULB IRRIG 60ML STRL (SYRINGE) ×2 IMPLANT
TOWEL OR 17X26 10 PK STRL BLUE (TOWEL DISPOSABLE) ×4 IMPLANT
TUBING CONNECTING 10 (TUBING) IMPLANT
WATER STERILE IRR 500ML POUR (IV SOLUTION) ×4 IMPLANT

## 2024-05-24 NOTE — Transfer of Care (Signed)
 Immediate Anesthesia Transfer of Care Note  Patient: [name]  Procedure(s) Performed: EXAM UNDER ANESTHESIA, PELVIC CYSTOSCOPY  Patient Location: PACU  Anesthesia Type:General  Level of Consciousness: awake, alert , and oriented  Airway & Oxygen Therapy: Patient Spontanous Breathing and Patient connected to face mask oxygen  Post-op Assessment: Report given to RN and Post -op Vital signs reviewed and stable  Post vital signs: Reviewed and stable  Last Vitals:  Vitals Value Taken Time  BP    Temp    Pulse 67 05/24/24 08:37  Resp 14 05/24/24 08:37  SpO2 100 % 05/24/24 08:37  Vitals shown include unfiled device data.  Last Pain:  Vitals:   05/24/24 0601  TempSrc: Oral  PainSc: 0-No pain         Complications: No notable events documented.

## 2024-05-24 NOTE — Interval H&P Note (Signed)
 History and Physical Interval Note:  05/24/2024 6:54 AM  [name]  has presented today for surgery, with the diagnosis of vaginal cancer.  The various methods of treatment have been discussed with the patient and family. After consideration of risks, benefits and other options for treatment, the patient has consented to  Procedure(s): EXAM UNDER ANESTHESIA, PELVIC (N/A) CYSTOSCOPY (N/A) as a surgical intervention.  The patient's history has been reviewed, patient examined, no change in status, stable for surgery.  I have reviewed the patient's chart and labs.  Questions were answered to the patient's satisfaction.     Comer JONELLE Dollar

## 2024-05-24 NOTE — Discharge Instructions (Signed)
 05/24/2024  I looked in your bladder today with a camera. No biopsies were taken. There was not definite evidence of invasion of your cancer into the bladder.  Activity: 1. Be up and out of the bed during the day.  Take a nap if needed.  You may walk up steps but be careful and use the hand rail.    2. Do Not drive if you are taking narcotic pain medicine.  3. You may have some blood in your urine today and the next few days. You also may have some vaginal bleeding.  Medications:  - Please use some medications to help prevent constipation - this can be sennakot, senna or Miralax.  Diet: 1. Low sodium Heart Healthy Diet is recommended.  2. It is safe to use a laxative if you have difficulty moving your bowels.   Reasons to call the Doctor:  Fever - Oral temperature greater than 100.4 degrees Fahrenheit Foul-smelling vaginal discharge Difficulty urinating Nausea and vomiting Difficulty breathing with or without chest pain New calf pain especially if only on one side Sudden, continuing increased vaginal bleeding with or without clots.   Follow-up: 1. With Dr. Lonn and Shannon as scheduled.  Contacts: For questions or concerns you should contact:  Dr. Comer Dollar at [phone] After hours and on week-ends call [phone] and ask to speak to the physician on call for Gynecologic Oncology

## 2024-05-24 NOTE — Progress Notes (Signed)
 Called and faxed in referral to Dr. Jacklin, [facility] at [phone].   Order for MRI pelvis w/wo contrast placed per Dr. Viktoria.  Called Brooke (granddaughter) and advised her about the referral to Dr. Jacklin and that [facility] will be reaching out for an appointment. Also let her know about the MRI scheduled for 06/30/2024 at 12:00 at [facility].  Gave instructions for NPO 4 hours before.

## 2024-05-24 NOTE — Anesthesia Procedure Notes (Signed)
 Procedure Name: LMA Insertion Date/Time: 05/24/2024 8:05 AM  Performed by: Kla Bily, CRNAOxygen Delivery Method: Circle system utilized Preoxygenation: Pre-oxygenation with 100% oxygen Induction Type: IV induction Ventilation: Mask ventilation without difficulty LMA: LMA inserted LMA Size: 4.0

## 2024-05-24 NOTE — Op Note (Signed)
 OPERATIVE NOTE  PATIENT: [name] DATE: 05/24/24  Preop Diagnosis: Vaginal cancer, concern for bladder invasion  Postoperative Diagnosis: no definitive bladder invasion, constipation  Surgery: Manual disimpaction, exam under anesthesia, cystoscopy  Surgeons:  Viktoria Crank MD  Assistant: none  Anesthesia: LMA   Estimated blood loss: minimal  IVF:  see I&O flowsheet  Urine output: n/a   Complications: None apparent  Pathology: none  Operative findings: On rectal exam, significant burden of hard stool, removed to facilitate rectal and rectovaginal exam. On vaginal exam, tumor rim around the apex of the vagina with central cavitation. On rectovaginal exam, bilateral parametrial invasion without definitive invasion appreciated through the rectum. On cystoscopy, dome intact. Efflux seen from bilateral ureteral orifices. Some fluffy tissue along the trigone, just internal to the urethral opening although no definitive evidence of tumor invasion. Ureteral opens and rest of the bladder base without visual abnormalities.   Procedure: The patient was identified in the preoperative holding area. Informed consent was signed on the chart. Patient was seen history was reviewed and exam was performed.   The patient was then taken to the operating room and placed in the supine position with SCD hose on. General anesthesia was then induced without difficulty. She was then placed in the dorsolithotomy position. The perineum was prepped with Betadine. The vagina was prepped with Betadine. The patient was then draped after the prep was dried.   Timeout was performed the patient, procedure, antibiotic, allergy, and length of procedure.   Exam was performed with findings as above.   Cystoscopy was then performed after the bladder was drained and then filled with sterile fluid. Findings noted above. Given no definitive evidence of tumor invasion, no biopsies taken. Cystoscope was  removed from the bladder.  All instrument, suture, laparotomy, Ray-Tec, and needle counts were correct x2. The patient tolerated the procedure well and was taken recovery room in stable condition.   Crank JONELLE Viktoria, MD

## 2024-05-24 NOTE — Anesthesia Postprocedure Evaluation (Signed)
 Anesthesia Post Note  Patient: [name]  Procedure(s) Performed: EXAM UNDER ANESTHESIA, PELVIC CYSTOSCOPY     Patient location during evaluation: PACU Anesthesia Type: General Level of consciousness: awake and alert Pain management: pain level controlled Vital Signs Assessment: post-procedure vital signs reviewed and stable Respiratory status: spontaneous breathing, nonlabored ventilation, respiratory function stable and patient connected to nasal cannula oxygen Cardiovascular status: blood pressure returned to baseline and stable Postop Assessment: no apparent nausea or vomiting Anesthetic complications: no  No notable events documented.  Last Vitals:  Vitals:   05/24/24 0900 05/24/24 0921  BP: 136/89 (!) 141/89  Pulse: 77 62  Resp: 20   Temp:    SpO2: 94% 92%    Last Pain:  Vitals:   05/24/24 0921  TempSrc:   PainSc: 0-No pain                 [name]

## 2024-05-25 ENCOUNTER — Ambulatory Visit
Admission: RE | Admit: 2024-05-25 | Discharge: 2024-05-25 | Disposition: A | Discharge status: Home / Self Care | Source: Ambulatory Visit | Attending: Radiation Oncology | Admitting: Radiation Oncology

## 2024-05-25 ENCOUNTER — Ambulatory Visit: Admitting: Internal Medicine

## 2024-05-25 ENCOUNTER — Telehealth: Payer: Self-pay | Admitting: Internal Medicine

## 2024-05-25 ENCOUNTER — Other Ambulatory Visit: Payer: Self-pay

## 2024-05-25 ENCOUNTER — Encounter (HOSPITAL_COMMUNITY): Payer: Self-pay | Admitting: Gynecologic Oncology

## 2024-05-25 ENCOUNTER — Telehealth: Payer: Self-pay | Admitting: *Deleted

## 2024-05-25 VITALS — BP 114/66 | HR 70 | Ht 65.0 in | Wt 195.0 lb

## 2024-05-25 DIAGNOSIS — J84112 Idiopathic pulmonary fibrosis: Secondary | ICD-10-CM | POA: Diagnosis not present

## 2024-05-25 DIAGNOSIS — C52 Malignant neoplasm of vagina: Secondary | ICD-10-CM | POA: Diagnosis not present

## 2024-05-25 DIAGNOSIS — Z510 Encounter for antineoplastic radiation therapy: Secondary | ICD-10-CM | POA: Diagnosis not present

## 2024-05-25 DIAGNOSIS — Z5181 Encounter for therapeutic drug level monitoring: Secondary | ICD-10-CM | POA: Diagnosis not present

## 2024-05-25 LAB — RAD ONC ARIA SESSION SUMMARY
Course Elapsed Days: 0
Plan Fractions Treated to Date: 1
Plan Prescribed Dose Per Fraction: 1.8 Gy
Plan Total Fractions Prescribed: 25
Plan Total Prescribed Dose: 45 Gy
Reference Point Dosage Given to Date: 1.8 Gy
Reference Point Session Dosage Given: 1.8 Gy
Session Number: 1
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
## 2024-05-25 NOTE — Telephone Encounter (Signed)
 [name] - -PlayStation interim from 150 mg twice daily to 100 mg twice daily

## 2024-05-25 NOTE — Progress Notes (Signed)
 OV 05/25/2024 - Subjective:  Patient ID: [name], female , DOB: 1936-05-18 , age 88 y.o. , MRN: [id number] , ADDRESS: [address] [place] [address] PCP [name], MD Patient Care Team: [name], MD as PCP - General (Family Medicine) [name], Jerel, MD as PCP - Cardiology (Cardiology) Shaaron Lamar HERO, MD as Consulting Physician (Gastroenterology)  This Provider for this visit: Treatment Team:  Attending Provider: Geronimo Amel, MD    05/25/2024 -   Chief Complaint  Patient presents with   Follow-up    Breathing is overall doing well. She is here today to see if she qualifies for amb o2.      HPI [name] 88 y.o. -presents with her granddaughter [name] who takes excellent care of her..  She is known to have IPF she follows with Dr. Meade.  She did do screening visit for research study.  She was then screen failed because of an abnormal UTI.  She subsequently called with the side effects of nintedanib.  Then this visit was made with me.  Earlier this month she complained of diarrhea and her nintedanib has been held.  In the interim between the recent visit and today the abnormal urine analysis resulted in a stage IV vaginal cancer diagnosis.  According to the granddaughter [name] and reviewed external medical records this is considered early stage vaginal cancer and the intent is to treat for cure.  She is now gotten carboplatin  therapy cycle x 1.  She is got a second cycle pending tomorrow.  She will have 5 more carboplatin  cycles pending.  She is 25 more radiations pending having completed 5.  Within the last few days she has had cystoscopy and also Port-A-Cath placement.  All this combined is left her with worsening edema that may be slowly better.  She is also more fragile and is now using a cane for the last 2 weeks although is able to manage and get along.  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    Overall handling chemotherapy well but for hypokalemia.  And having hypomagnesemia\  She does not think IPF is any worse  She is potentially interested in taking nintedanib but is dependent on MD advice.    Last pulmonary function test was a year ago.  PFT     Latest Ref Rng & Units 04/08/2023    2:09 PM 09/04/2022    9:03 AM 02/26/2022   12:39 PM  PFT Results  FVC-Pre L 1.61  1.55  1.69   FVC-Predicted Pre % 64  60  64   FVC-Post L   1.60   FVC-Predicted Post %   60   Pre FEV1/FVC % % 90  87  85   Post FEV1/FCV % %   90   FEV1-Pre L 1.45  1.35  1.44   FEV1-Predicted Pre % 78  71  74   FEV1-Post L   1.44   DLCO uncorrected ml/min/mmHg 13.09  10.50  11.18   DLCO [facility]% % 68  54  57   DLCO corrected ml/min/mmHg 13.09  10.50    DLCO COR %Predicted % 68  54    DLVA Predicted % 130  101  102   TLC L   2.89   TLC % Predicted %   54   RV % Predicted %   50  LAB RESULTS last 96 hours IR IMAGING GUIDED PORT INSERTION Result Date: 05/23/2024 INDICATION: 88 year old with vaginal cancer. EXAM: FLUOROSCOPIC AND ULTRASOUND GUIDED PLACEMENT OF A SUBCUTANEOUS PORT MEDICATIONS: Moderate sedation ANESTHESIA/SEDATION: Moderate (conscious) sedation was employed during this procedure. A total of Versed  1.5 mg and fentanyl  50 mcg was administered intravenously at the order of the provider performing the procedure. Total intra-service moderate sedation time: 30 minutes. Patient's level of consciousness and vital signs were monitored continuously by radiology nurse throughout the procedure under the supervision of the provider performing the procedure. FLUOROSCOPY TIME:  Radiation Exposure Index (as provided by the fluoroscopic device): 3 mGy Kerma COMPLICATIONS: None immediate. PROCEDURE: The procedure, risks, benefits, and alternatives were explained to the patient. Questions regarding the procedure were  encouraged and answered. The patient understands and consents to the procedure. Patient was placed supine on the interventional table. Ultrasound confirmed a patent right internal jugular vein. Ultrasound image was saved for documentation. The right chest and neck were cleaned with a skin antiseptic and a sterile drape was placed. Maximal barrier sterile technique was utilized including caps, mask, sterile gowns, sterile gloves, sterile drape, hand hygiene and skin antiseptic. The right neck was anesthetized with 1% lidocaine . Small incision was made in the right neck with a blade. Micropuncture set was placed in the right internal jugular vein with ultrasound guidance. The micropuncture wire was used for measurement purposes. The right chest was anesthetized with 1% lidocaine  with epinephrine . #15 blade was used to make an incision and a subcutaneous port pocket was formed. 8 french Power Port was assembled. Subcutaneous tunnel was formed with a stiff tunneling device. The port catheter was brought through the subcutaneous tunnel. The port was placed in the subcutaneous pocket. The micropuncture set was exchanged for a peel-away sheath. The catheter was placed through the peel-away sheath and the tip was positioned at the superior cavoatrial junction. Catheter placement was confirmed with fluoroscopy. The port was accessed and flushed with heparinized saline. The port pocket was closed using two layers of absorbable sutures and Dermabond. The vein skin site was closed using a single layer of absorbable suture and Dermabond. Sterile dressings were applied. Patient tolerated the procedure well without an immediate complication. Ultrasound and fluoroscopic images were taken and saved for this procedure. IMPRESSION: Placement of a subcutaneous power-injectable port device. Catheter tip at the superior cavoatrial junction. Electronically Signed   By: Juliene Balder M.D.   On: 05/23/2024 16:39         has a past  medical history of Arthritis, CHF (congestive heart failure) (HCC), Chronic Leukemia, Depression, Dysrhythmia, H/O hiatal hernia, Hernia, Hernia, Hypertension, Hypothyroidism, Leg swelling, Osteitis pubis (HCC) (05/30/2001), Osteopenia (03/30/2009), Osteopenia (07/30/2022), Pneumonia, and Thyroid  disease.   reports that she has never smoked. She has never used smokeless tobacco.  Past Surgical History:  Procedure Laterality Date   ABDOMINAL HYSTERECTOMY  03/1970   APPENDECTOMY  03/1966   BILATERAL SALPINGOOPHORECTOMY     CARDIOVERSION N/A 03/10/2022   Procedure: CARDIOVERSION;  Surgeon: Francyne Headland, MD;  Location: [facility];  Service: Cardiovascular;  Laterality: N/A;   CATARACT EXTRACTION W/PHACO Right 09/25/2014   Procedure: CATARACT EXTRACTION PHACO AND INTRAOCULAR LENS PLACEMENT (IOC);  Surgeon: Oneil T. Roz, MD;  Location: [facility];  Service: Ophthalmology;  Laterality: Right;  CDE:7.56   CATARACT EXTRACTION W/PHACO Left 10/09/2014   Procedure: CATARACT EXTRACTION PHACO AND INTRAOCULAR LENS PLACEMENT LEFT EYE CDE=6.51;  Surgeon: Oneil T. Roz, MD;  Location: [facility];  Service: Ophthalmology;  Laterality:  Left;   CHOLECYSTECTOMY  1980   COLONOSCOPY  1999   COLONOSCOPY  March 2007   Repeat 10 years   COLONOSCOPY  11/06/2003    Internal and external hemorrhoids/Two tiny rectal distal sigmoid polyps cold biopsied/Otherwise within normal limits to the cecum   COLONOSCOPY N/A 08/29/2014   Dr. Shaaron: Sessile polyp was found at the cecum status post cold snare polypectomy, tubular adenoma. No further surveillance unless clinical status changes   CYSTOSCOPY N/A 05/24/2024   Procedure: CYSTOSCOPY;  Surgeon: Viktoria Comer SAUNDERS, MD;  Location: [facility];  Service: Gynecology;  Laterality: N/A;   ESOPHAGOGASTRODUODENOSCOPY  2007   Dr. Shaaron: normal esophagus, small tom oderate sized hiatal hernia, Cameron lesions   EXAM UNDER ANESTHESIA, PELVIC N/A 05/24/2024   Procedure: EXAM UNDER ANESTHESIA,  PELVIC;  Surgeon: Viktoria Comer SAUNDERS, MD;  Location: [facility];  Service: Gynecology;  Laterality: N/A;   IR IMAGING GUIDED PORT INSERTION  05/23/2024   SKIN CANCER EXCISION Left 11 1 2016   left arm squamous cancer   TUMOR REMOVAL  03/1968   ovarian    Allergies  Allergen Reactions   Erythromycin Shortness Of Breath   Aspirin Other (See Comments)    Caused internal bleeding. Patient had ulcer at the time. Instructed by PCP - Nancey not to take any longer.   Ibuprofen     Instructed not to take by PCP - Nancey due to reaction to aspirin as well (precautionary).    Immunization History  Administered Date(s) Administered   Fluad Quad(high Dose 65+) 09/24/2020, 09/04/2022   Fluad Trivalent(High Dose 65+) 08/17/2023   Influenza, High Dose Seasonal PF 08/30/2021   Influenza,inj,Quad PF,6+ Mos 09/06/2018   Influenza-Unspecified 08/31/2015, 08/06/2016, 09/16/2017, 10/19/2019   PFIZER(Purple Top)SARS-COV-2 Vaccination 12/04/2019, 12/26/2019   Pneumococcal Conjugate-13 06/19/2014   Pneumococcal Polysaccharide-23 10/05/2017   Zoster Recombinant(Shingrix) 01/05/2023   Zoster, Live 10/05/2012    Family History  Problem Relation Age of Onset   Dementia Mother    Heart disease Father    Cancer Father        throat   Cancer Sister        breast   Breast cancer Sister    Lung cancer Grandson    Colon cancer Neg Hx    Lung disease Neg Hx    Autoimmune disease Neg Hx      Current Outpatient Medications:    acetaminophen (TYLENOL) 650 MG CR tablet, Take 650 mg by mouth every 8 (eight) hours as needed for pain., Disp: , Rfl:    albuterol  (VENTOLIN  HFA) 108 (90 Base) MCG/ACT inhaler, Inhale 2 puffs into the lungs every 6 (six) hours as needed for wheezing or shortness of breath., Disp: 18 g, Rfl: 1   alendronate  (FOSAMAX ) 70 MG tablet, TAKE 1 TABLET(70 MG) BY MOUTH EVERY 7 DAYS WITH A FULL GLASS OF WATER  AND ON AN EMPTY STOMACH, Disp: 4 tablet, Rfl: 3   ALPRAZolam  (XANAX ) 0.5 MG tablet,  TAKE 1 TABLET BY MOUTH EVERY NIGHT AT BEDTIME. MAY TAKE 1/2 TABLET DURING THE DAY AS NEEDED, Disp: 45 tablet, Rfl: 4   Cholecalciferol (VITAMIN D3) 2000 UNITS TABS, Take 2,000 Units by mouth daily., Disp: , Rfl:    ELIQUIS  5 MG TABS tablet, TAKE 1 TABLET(5 MG) BY MOUTH TWICE DAILY. FOLLOW-UP, Disp: 180 tablet, Rfl: 1   indapamide  (LOZOL ) 1.25 MG tablet, Take 1 tablet (1.25 mg total) by mouth every morning., Disp: 90 tablet, Rfl: 0   levothyroxine  (SYNTHROID ) 125 MCG tablet, TAKE 1  TABLET BY MOUTH DAILY, Disp: 90 tablet, Rfl: 1   lidocaine -prilocaine  (EMLA ) cream, Apply to affected area once, Disp: 30 g, Rfl: 3   magnesium gluconate (MAGONATE) 500 (27 Mg) MG TABS tablet, Take 500 mg by mouth daily at 6 (six) AM., Disp: , Rfl:    metoprolol  succinate (TOPROL -XL) 25 MG 24 hr tablet, TAKE 1 TABLET BY MOUTH DAILY, Disp: 90 tablet, Rfl: 1   Multiple Vitamin (MULTI-VITAMIN DAILY PO), Take 1 tablet by mouth daily., Disp: , Rfl:    ondansetron  (ZOFRAN ) 8 MG tablet, Take 1 tablet (8 mg total) by mouth every 8 (eight) hours as needed for nausea or vomiting. Start on the third day after chemotherapy., Disp: 30 tablet, Rfl: 1   ondansetron  (ZOFRAN -ODT) 4 MG disintegrating tablet, Take 1 tablet (4 mg total) by mouth every 8 (eight) hours as needed., Disp: 20 tablet, Rfl: 0   pantoprazole  (PROTONIX ) 40 MG tablet, TAKE 1 TABLET(40 MG) BY MOUTH DAILY, Disp: 90 tablet, Rfl: 1   potassium chloride  (KLOR-CON  M) 10 MEQ tablet, TAKE 1 TABLET(10 MEQ) BY MOUTH DAILY, Disp: 90 tablet, Rfl: 1   prochlorperazine  (COMPAZINE ) 10 MG tablet, Take 1 tablet (10 mg total) by mouth every 6 (six) hours as needed for nausea or vomiting., Disp: 30 tablet, Rfl: 1   sertraline  (ZOLOFT ) 100 MG tablet, TAKE 1 TABLET BY MOUTH EVERY DAY, Disp: 90 tablet, Rfl: 1   Nintedanib (OFEV ) 150 MG CAPS, Take 1 capsule (150 mg total) by mouth 2 (two) times daily. (Patient not taking: Reported on 05/25/2024), Disp: 180 capsule, Rfl: 1      Objective:    Vitals:   05/25/24 1525 05/25/24 1535  BP: 114/66   Pulse: 70   SpO2:  98%  Weight: 195 lb (88.5 kg)   Height: 5' 5 (1.651 m)     Estimated body mass index is 32.45 kg/m as calculated from the following:   Height as of this encounter: 5' 5 (1.651 m).   Weight as of this encounter: 195 lb (88.5 kg).  @WEIGHTCHANGE @  [employer]   05/25/24 1525  Weight: 195 lb (88.5 kg)     Physical Exam   General: No distress. Sitting on wheel chair.  Pleasant but mild deconditioned O2 at rest: no Cane present: no Sitting in wheel chair: YES Frail: no Obese: no Neuro: Alert and Oriented x 3. GCS 15. Speech normal Psych: Pleasant Resp:  Barrel Chest - no.  Wheeze - no, Crackles - YES, No overt respiratory distress CVS: Normal heart sounds. Murmurs - no Ext: Stigmata of Connective Tissue Disease - NO but HAS BIALTERAL 2-3+ EEMA - chronic HEENT: Normal upper airway. PEERL +. No post nasal drip        Assessment:       ICD-10-CM   1. Idiopathic pulmonary fibrosis (HCC)  J84.112     2. Encounter for therapeutic drug monitoring  Z51.81     3. Vaginal cancer (HCC)  C52          Plan:     Patient Instructions  IPF ENcouter therapeutic monitoring Stage 4 vAginal caner  - Glad diarrhea while being on nintedanib is resolved - Now having constipation - Noted new diagnosis of stage IV vaginal cancer but with intent to cure the disease - Treatment such as carboplatin  can aggravate your lung disease  Dicussion-shared decision making discussion  -On 1 hand with advanced vaginal cancer and limited prognosis the might not be of benefit with nintedanib and then adding this  to existing chemotherapy and radiation could increase the risk of side effects  - On the other hand there appears to be an intent to cure your vaginal cancer and antifibrotic's can potentially dampen any side effects of the lung from carboplatin .  Therefore there is a case to be made to restart your  nintedanib  Plan -Transfer of care from Dr. Meade to Dr. Geronimo  - I have sent nintedanib request order for 100 mg twice daily nintedanib but until then (d/w pharmacy)   - Restart nintedanib at the low-dose protocol  - 150 mg twice daily on Monday Wednesday Friday,   - 150 mg once daily on Tuesday, Thursday and Saturday  - Holiday on Sunday  - Granddaughter [name] will oversee administration   #pedal edema  - could be due to recent chemo  Pl;an - Plsaes note if leg swelling getting wrose, talk to PCP Alphonsa, Glendia LABOR, MD or Oncolgy   Follow-up - 8 weeks with Dr. Geronimo or nurse practitioner 30-minute visit        FOLLOWUP Return in about 8 weeks (around 07/20/2024) for 30 min visit, with Dr Geronimo, with any of the [facility].    SIGNATURE    Dr. Dorethia Geronimo, M.D., F.C.C.P,  Pulmonary and Critical Care Medicine Staff Physician, [facility] Center Director - Interstitial Lung Disease  Program  Pulmonary Fibrosis [facility] at [facility] [place], [place], [address]  Pager: [phone], If no answer or between  15:00h - 7:00h: call [phone] Telephone: [phone]  5:22 PM 05/25/2024

## 2024-05-25 NOTE — Patient Instructions (Addendum)
 IPF ENcouter therapeutic monitoring Stage 4 vAginal caner  - Glad diarrhea while being on nintedanib is resolved - Now having constipation - Noted new diagnosis of stage IV vaginal cancer but with intent to cure the disease - Treatment such as carboplatin  can aggravate your lung disease  Dicussion-shared decision making discussion  -On 1 hand with advanced vaginal cancer and limited prognosis the might not be of benefit with nintedanib and then adding this to existing chemotherapy and radiation could increase the risk of side effects  - On the other hand there appears to be an intent to cure your vaginal cancer and antifibrotic's can potentially dampen any side effects of the lung from carboplatin .  Therefore there is a case to be made to restart your nintedanib  Plan -Transfer of care from Dr. Meade to Dr. Geronimo  - I have sent nintedanib request order for 100 mg twice daily nintedanib but until then (d/w pharmacy)   - Restart nintedanib at the low-dose protocol  - 150 mg twice daily on Monday Wednesday Friday,   - 150 mg once daily on Tuesday, Thursday and Saturday  - Holiday on Sunday  - Granddaughter [name] will oversee administration   #pedal edema  - could be due to recent chemo  Pl;an - Plsaes note if leg swelling getting wrose, talk to PCP Alphonsa, Glendia LABOR, MD or Oncolgy   Follow-up - 8 weeks with Dr. Geronimo or nurse practitioner 30-minute visit

## 2024-05-25 NOTE — Telephone Encounter (Signed)
 Spoke with [name] this afternoon. She states she is eating, drinking and urinating well. She has not had a BM yet but is passing gas. She is taking senokot as prescribed and encouraged her to drink plenty of water . She denies fever or chills.  She rates her pain 0/10.   Instructed to call office with any fever, chills, purulent drainage, uncontrolled pain or any other questions or concerns. Patient verbalizes understanding.   Pt aware of post op appointments as well as the office number [phone] and after hours number [phone] to call if she has any questions or concerns

## 2024-05-26 ENCOUNTER — Encounter: Payer: Self-pay | Admitting: Hematology and Oncology

## 2024-05-26 ENCOUNTER — Inpatient Hospital Stay: Admitting: Hematology and Oncology

## 2024-05-26 ENCOUNTER — Other Ambulatory Visit: Payer: Self-pay

## 2024-05-26 ENCOUNTER — Inpatient Hospital Stay

## 2024-05-26 ENCOUNTER — Ambulatory Visit
Admission: RE | Admit: 2024-05-26 | Discharge: 2024-05-26 | Disposition: A | Discharge status: Home / Self Care | Source: Ambulatory Visit | Attending: Radiation Oncology | Admitting: Radiation Oncology

## 2024-05-26 VITALS — BP 155/62 | HR 69 | Resp 18 | Ht 65.0 in | Wt 195.8 lb

## 2024-05-26 DIAGNOSIS — R600 Localized edema: Secondary | ICD-10-CM | POA: Diagnosis not present

## 2024-05-26 DIAGNOSIS — C52 Malignant neoplasm of vagina: Secondary | ICD-10-CM

## 2024-05-26 DIAGNOSIS — D6481 Anemia due to antineoplastic chemotherapy: Secondary | ICD-10-CM | POA: Diagnosis not present

## 2024-05-26 DIAGNOSIS — Z510 Encounter for antineoplastic radiation therapy: Secondary | ICD-10-CM | POA: Diagnosis not present

## 2024-05-26 DIAGNOSIS — T451X5A Adverse effect of antineoplastic and immunosuppressive drugs, initial encounter: Secondary | ICD-10-CM

## 2024-05-26 LAB — CBC WITH DIFFERENTIAL (CANCER CENTER ONLY)
Abs Immature Granulocytes: 0.03 10*3/uL (ref 0.00–0.07)
Basophils Absolute: 0 10*3/uL (ref 0.0–0.1)
Basophils Relative: 0 %
Eosinophils Absolute: 0.1 10*3/uL (ref 0.0–0.5)
Eosinophils Relative: 1 %
HCT: 31.2 % — ABNORMAL LOW (ref 36.0–46.0)
Hemoglobin: 10.4 g/dL — ABNORMAL LOW (ref 12.0–15.0)
Immature Granulocytes: 0 %
Lymphocytes Relative: 40 %
Lymphs Abs: 4.9 10*3/uL — ABNORMAL HIGH (ref 0.7–4.0)
MCH: 31 pg (ref 26.0–34.0)
MCHC: 33.3 g/dL (ref 30.0–36.0)
MCV: 93.1 fL (ref 80.0–100.0)
Monocytes Absolute: 0.8 10*3/uL (ref 0.1–1.0)
Monocytes Relative: 6 %
Neutro Abs: 6.4 10*3/uL (ref 1.7–7.7)
Neutrophils Relative %: 53 %
Platelet Count: 367 10*3/uL (ref 150–400)
RBC: 3.35 MIL/uL — ABNORMAL LOW (ref 3.87–5.11)
RDW: 14.1 % (ref 11.5–15.5)
WBC Count: 12.2 10*3/uL — ABNORMAL HIGH (ref 4.0–10.5)
nRBC: 0 % (ref 0.0–0.2)

## 2024-05-26 LAB — RAD ONC ARIA SESSION SUMMARY
Course Elapsed Days: 1
Plan Fractions Treated to Date: 2
Plan Prescribed Dose Per Fraction: 1.8 Gy
Plan Total Fractions Prescribed: 25
Plan Total Prescribed Dose: 45 Gy
Reference Point Dosage Given to Date: 3.6 Gy
Reference Point Session Dosage Given: 1.8 Gy
Session Number: 2

## 2024-05-26 LAB — BASIC METABOLIC PANEL - CANCER CENTER ONLY
Anion gap: 6 (ref 5–15)
BUN: 16 mg/dL (ref 8–23)
CO2: 33 mmol/L — ABNORMAL HIGH (ref 22–32)
Calcium: 8.6 mg/dL — ABNORMAL LOW (ref 8.9–10.3)
Chloride: 97 mmol/L — ABNORMAL LOW (ref 98–111)
Creatinine: 0.91 mg/dL (ref 0.44–1.00)
GFR, Estimated: >60 mL/min (ref >60)
Glucose, Bld: 79 mg/dL (ref 70–99)
Potassium: 3.7 mmol/L (ref 3.5–5.1)
Sodium: 136 mmol/L (ref 135–145)

## 2024-05-26 MED ORDER — DEXAMETHASONE SODIUM PHOSPHATE 10 MG/ML IJ SOLN
10.0000 mg | Freq: Once | INTRAMUSCULAR | Status: AC
  Administered 2024-05-26: 10 mg via INTRAVENOUS

## 2024-05-26 MED ORDER — SODIUM CHLORIDE 0.9 % IV SOLN: INTRAVENOUS | Status: DC

## 2024-05-26 MED ORDER — SODIUM CHLORIDE 0.9% FLUSH
10.0000 mL | Freq: Once | INTRAVENOUS | Status: AC
Start: 2024-05-26 — End: 2024-05-26
  Administered 2024-05-26: 10 mL

## 2024-05-26 MED ORDER — OFEV 100 MG PO CAPS: 100.0000 mg | ORAL_CAPSULE | Freq: Two times a day (BID) | ORAL | 1 refills | Status: DC

## 2024-05-26 MED ORDER — SODIUM CHLORIDE 0.9% FLUSH: 10.0000 mL | INTRAVENOUS | Status: DC | PRN

## 2024-05-26 MED ORDER — SODIUM CHLORIDE 0.9 % IV SOLN
160.8000 mg | Freq: Once | INTRAVENOUS | Status: AC
  Administered 2024-05-26: 160 mg via INTRAVENOUS
  Filled 2024-05-26: qty 16

## 2024-05-26 MED ORDER — HEPARIN SOD (PORK) LOCK FLUSH 100 UNIT/ML IV SOLN: 500.0000 [IU] | Freq: Once | INTRAVENOUS | Status: DC | PRN

## 2024-05-26 MED ORDER — PALONOSETRON HCL INJECTION 0.25 MG/5ML
0.2500 mg | Freq: Once | INTRAVENOUS | Status: AC
  Administered 2024-05-26: 0.25 mg via INTRAVENOUS
  Filled 2024-05-26: qty 5

## 2024-05-26 NOTE — Progress Notes (Signed)
 [facility] OFFICE PROGRESS NOTE  Patient Care Team: [name], MD as PCP - General (Family Medicine) [name], Jerel, MD as PCP - Cardiology (Cardiology) Shaaron Lamar HERO, MD as Consulting Physician (Gastroenterology)  Assessment & Plan Vaginal cancer [facility]) 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, CARIS: P53 mutation, BRAF negative, MSI stable, low tumor mutation burden 53mut/Mb, HER2/neu score 0, negative, PD-L1 0%  She had her port placed successfully Her treatment course is complicated by anemia, fluid retention and recent bleeding The bleeding is not caused by her treatment Will proceed with treatment without delay Bilateral leg edema Her leg edema is worse due to recent treatment and discontinuation of diuretic therapy I am not enthusiastic to put her on diuretic given severe hypokalemia Recommend leg elevation and elastic compression hose as well as dietary restriction of salt Anemia due to antineoplastic chemotherapy This is likely due to recent treatment. The patient denies recent history of bleeding such as epistaxis, hematuria or hematochezia. She is asymptomatic from the anemia. I will observe for now.  She does not require transfusion now. I will continue the chemotherapy at current dose without dosage adjustment.  If the anemia gets progressive worse in the future, I might have to delay her treatment or adjust the chemotherapy dose.   No orders of the defined types were placed in this encounter.    Almarie Bedford, MD  INTERVAL HISTORY: she returns for treatment follow-up Complications related to previous cycle of chemotherapy included anemia, and leg edema She is not symptomatic from anemia She had recent bleeding from her varicose veins She has noticed some leg  swelling Shortness of breath is stable She had recent constipation but resolved with laxatives  PHYSICAL EXAMINATION: ECOG PERFORMANCE STATUS: 1 - Symptomatic but completely ambulatory  No results found for: CAN125    Latest Ref Rng & Units 05/26/2024    1:32 PM 05/15/2024    1:49 PM 05/15/2024   10:01 AM  CBC  WBC 4.0 - 10.5 K/uL 12.2  13.9  14.8   Hemoglobin 12.0 - 15.0 g/dL 89.5  87.4  86.7   Hematocrit 36.0 - 46.0 % 31.2  35.6  40.1   Platelets 150 - 400 K/uL 367  391  418       Chemistry      Component Value Date/Time   NA 130 (L) 05/24/2024 0632   NA 138 11/05/2023 0943   K 3.7 05/24/2024 0632   CL 92 (L) 05/24/2024 0632   CO2 29 05/24/2024 0632   BUN 17 05/24/2024 0632   BUN 13 11/05/2023 0943   CREATININE 1.13 (H) 05/24/2024 0632   CREATININE 0.97 05/19/2024 1325   CREATININE 0.78 06/12/2014 0950      Component Value Date/Time   CALCIUM 8.4 (L) 05/24/2024 0632   ALKPHOS 61 05/15/2024 1001   AST 27 05/15/2024 1001   AST 22 04/28/2024 1321   ALT 13 05/15/2024 1001   ALT 11 04/28/2024 1321   BILITOT 1.2 05/15/2024 1001   BILITOT 0.5 04/28/2024 1321       Vitals:   05/26/24 1348  BP: (!) 155/62  Pulse: 69  Resp: 18  SpO2: 97%   Filed Weights   05/26/24 1348  Weight: 195 lb 12.8 oz (88.8 kg)   Other relevant data reviewed during this visit included CBC and CMP

## 2024-05-26 NOTE — Assessment & Plan Note (Addendum)
 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, CARIS: P53 mutation, BRAF negative, MSI stable, low tumor mutation burden 49mut/Mb, HER2/neu score 0, negative, PD-L1 0%  She had her port placed successfully Her treatment course is complicated by anemia, fluid retention and recent bleeding The bleeding is not caused by her treatment Will proceed with treatment without delay

## 2024-05-26 NOTE — Patient Instructions (Signed)
 [facility] - A DEPT OF [facility]  Discharge Instructions: Thank you for choosing [facility] to provide your oncology and hematology care.   If you have a lab appointment with the Cancer Center, please go directly to the Cancer Center and check in at the registration area.   Wear comfortable clothing and clothing appropriate for easy access to any Portacath or PICC line.   We strive to give you quality time with your provider. You may need to reschedule your appointment if you arrive late (15 or more minutes).  Arriving late affects you and other patients whose appointments are after yours.  Also, if you miss three or more appointments without notifying the office, you may be dismissed from the clinic at the provider's discretion.      For prescription refill requests, have your pharmacy contact our office and allow 72 hours for refills to be completed.    Today you received the following chemotherapy and/or immunotherapy agents: Carboplatin      To help prevent nausea and vomiting after your treatment, we encourage you to take your nausea medication as directed.  BELOW ARE SYMPTOMS THAT SHOULD BE REPORTED IMMEDIATELY: *FEVER GREATER THAN 100.4 F (38 C) OR HIGHER *CHILLS OR SWEATING *NAUSEA AND VOMITING THAT IS NOT CONTROLLED WITH YOUR NAUSEA MEDICATION *UNUSUAL SHORTNESS OF BREATH *UNUSUAL BRUISING OR BLEEDING *URINARY PROBLEMS (pain or burning when urinating, or frequent urination) *BOWEL PROBLEMS (unusual diarrhea, constipation, pain near the anus) TENDERNESS IN MOUTH AND THROAT WITH OR WITHOUT PRESENCE OF ULCERS (sore throat, sores in mouth, or a toothache) UNUSUAL RASH, SWELLING OR PAIN  UNUSUAL VAGINAL DISCHARGE OR ITCHING   Items with * indicate a potential emergency and should be followed up as soon as possible or go to the Emergency Department if any problems should occur.  Please show the CHEMOTHERAPY ALERT CARD or  IMMUNOTHERAPY ALERT CARD at check-in to the Emergency Department and triage nurse.  Should you have questions after your visit or need to cancel or reschedule your appointment, please contact [facility] - A DE[facility]  Dept: [phone]  and follow the prompts.  Office hours are 8:00 a.m. to 4:30 p.m. Monday - Friday. Please note that voicemails left after 4:00 p.m. may not be returned until the following business day.  We are closed weekends and major holidays. You have access to a nurse at all times for urgent questions. Please call the main number to the clinic Dept: [phone] and follow the prompts.   For any non-urgent questions, you may also contact your provider using MyChart. We now offer e-Visits for anyone 55 and older to request care online for non-urgent symptoms. For details visit [email or web address].   Also download the MyChart app! Go to the app store, search "MyChart", open the app, select [facility], and log in with your MyChart username and password.

## 2024-05-26 NOTE — Telephone Encounter (Signed)
 Rx for Ofev  100mg  twice daily sent to [facility].  Sherry Pennant, PharmD, MPH, BCPS, CPP Clinical Pharmacist (Rheumatology and Pulmonology)

## 2024-05-26 NOTE — Assessment & Plan Note (Addendum)
 This is likely due to recent treatment. The patient denies recent history of bleeding such as epistaxis, hematuria or hematochezia. She is asymptomatic from the anemia. I will observe for now.  She does not require transfusion now. I will continue the chemotherapy at current dose without dosage adjustment.  If the anemia gets progressive worse in the future, I might have to delay her treatment or adjust the chemotherapy dose.

## 2024-05-26 NOTE — Assessment & Plan Note (Addendum)
 Her leg edema is worse due to recent treatment and discontinuation of diuretic therapy I am not enthusiastic to put her on diuretic given severe hypokalemia Recommend leg elevation and elastic compression hose as well as dietary restriction of salt

## 2024-05-28 ENCOUNTER — Other Ambulatory Visit: Payer: Self-pay | Admitting: Physician Assistant

## 2024-05-28 DIAGNOSIS — J84112 Idiopathic pulmonary fibrosis: Secondary | ICD-10-CM

## 2024-05-29 ENCOUNTER — Ambulatory Visit
Admission: RE | Admit: 2024-05-29 | Discharge: 2024-05-29 | Disposition: A | Discharge status: Home / Self Care | Source: Ambulatory Visit | Attending: Radiation Oncology | Admitting: Radiation Oncology

## 2024-05-29 ENCOUNTER — Other Ambulatory Visit: Payer: Self-pay

## 2024-05-29 ENCOUNTER — Telehealth: Payer: Self-pay | Admitting: Oncology

## 2024-05-29 DIAGNOSIS — C52 Malignant neoplasm of vagina: Secondary | ICD-10-CM | POA: Diagnosis not present

## 2024-05-29 DIAGNOSIS — Z510 Encounter for antineoplastic radiation therapy: Secondary | ICD-10-CM | POA: Diagnosis not present

## 2024-05-29 LAB — RAD ONC ARIA SESSION SUMMARY
Course Elapsed Days: 4
Plan Fractions Treated to Date: 3
Plan Prescribed Dose Per Fraction: 1.8 Gy
Plan Total Fractions Prescribed: 25
Plan Total Prescribed Dose: 45 Gy
Reference Point Dosage Given to Date: 5.4 Gy
Reference Point Session Dosage Given: 1.8 Gy
Session Number: 3

## 2024-05-29 NOTE — Telephone Encounter (Signed)
 Called [facility] to check on status of referral to Dr. Jacklin with radiation oncology.  They have received the referral and are working in scheduling [name] for an appointment.

## 2024-05-30 ENCOUNTER — Ambulatory Visit
Admission: RE | Admit: 2024-05-30 | Discharge: 2024-05-30 | Disposition: A | Discharge status: Home / Self Care | Source: Ambulatory Visit | Attending: Radiation Oncology | Admitting: Radiation Oncology

## 2024-05-30 ENCOUNTER — Other Ambulatory Visit: Payer: Self-pay

## 2024-05-30 ENCOUNTER — Telehealth: Payer: Self-pay | Admitting: Oncology

## 2024-05-30 DIAGNOSIS — T451X5A Adverse effect of antineoplastic and immunosuppressive drugs, initial encounter: Secondary | ICD-10-CM | POA: Insufficient documentation

## 2024-05-30 DIAGNOSIS — Z5111 Encounter for antineoplastic chemotherapy: Secondary | ICD-10-CM | POA: Insufficient documentation

## 2024-05-30 DIAGNOSIS — Z510 Encounter for antineoplastic radiation therapy: Secondary | ICD-10-CM | POA: Insufficient documentation

## 2024-05-30 DIAGNOSIS — D6481 Anemia due to antineoplastic chemotherapy: Secondary | ICD-10-CM | POA: Diagnosis not present

## 2024-05-30 DIAGNOSIS — C52 Malignant neoplasm of vagina: Secondary | ICD-10-CM | POA: Insufficient documentation

## 2024-05-30 LAB — RAD ONC ARIA SESSION SUMMARY
Course Elapsed Days: 5
Plan Fractions Treated to Date: 4
Plan Prescribed Dose Per Fraction: 1.8 Gy
Plan Total Fractions Prescribed: 25
Plan Total Prescribed Dose: 45 Gy
Reference Point Dosage Given to Date: 7.2 Gy
Reference Point Session Dosage Given: 1.8 Gy
Session Number: 4

## 2024-05-30 NOTE — Telephone Encounter (Signed)
 Called [facility] regarding PA for EMLA  cream.  Advised them the EMLA  cream has been authorized.  They reran it and the copay will be $12.47.  Called Brooke and let her know.  She will pick it up today.

## 2024-05-31 ENCOUNTER — Other Ambulatory Visit: Payer: Self-pay

## 2024-05-31 ENCOUNTER — Ambulatory Visit
Admission: RE | Admit: 2024-05-31 | Discharge: 2024-05-31 | Disposition: A | Discharge status: Home / Self Care | Source: Ambulatory Visit | Attending: Radiation Oncology | Admitting: Radiation Oncology

## 2024-05-31 DIAGNOSIS — Z510 Encounter for antineoplastic radiation therapy: Secondary | ICD-10-CM | POA: Diagnosis not present

## 2024-05-31 DIAGNOSIS — C52 Malignant neoplasm of vagina: Secondary | ICD-10-CM | POA: Diagnosis not present

## 2024-05-31 LAB — RAD ONC ARIA SESSION SUMMARY
Course Elapsed Days: 6
Plan Fractions Treated to Date: 5
Plan Prescribed Dose Per Fraction: 1.8 Gy
Plan Total Fractions Prescribed: 25
Plan Total Prescribed Dose: 45 Gy
Reference Point Dosage Given to Date: 9 Gy
Reference Point Session Dosage Given: 1.8 Gy
Session Number: 5

## 2024-06-01 ENCOUNTER — Ambulatory Visit
Admission: RE | Admit: 2024-06-01 | Discharge: 2024-06-01 | Disposition: A | Discharge status: Home / Self Care | Source: Ambulatory Visit | Attending: Radiation Oncology | Admitting: Radiation Oncology

## 2024-06-01 ENCOUNTER — Inpatient Hospital Stay: Admitting: Hematology and Oncology

## 2024-06-01 ENCOUNTER — Other Ambulatory Visit: Payer: Self-pay

## 2024-06-01 ENCOUNTER — Encounter: Payer: Self-pay | Admitting: Hematology and Oncology

## 2024-06-01 ENCOUNTER — Inpatient Hospital Stay

## 2024-06-01 VITALS — BP 139/55 | HR 69 | Temp 97.4°F | Resp 15 | Wt 194.0 lb

## 2024-06-01 DIAGNOSIS — T451X5A Adverse effect of antineoplastic and immunosuppressive drugs, initial encounter: Secondary | ICD-10-CM | POA: Insufficient documentation

## 2024-06-01 DIAGNOSIS — Z5111 Encounter for antineoplastic chemotherapy: Secondary | ICD-10-CM | POA: Insufficient documentation

## 2024-06-01 DIAGNOSIS — E876 Hypokalemia: Secondary | ICD-10-CM

## 2024-06-01 DIAGNOSIS — C52 Malignant neoplasm of vagina: Secondary | ICD-10-CM | POA: Insufficient documentation

## 2024-06-01 DIAGNOSIS — Z510 Encounter for antineoplastic radiation therapy: Secondary | ICD-10-CM | POA: Diagnosis not present

## 2024-06-01 DIAGNOSIS — D6481 Anemia due to antineoplastic chemotherapy: Secondary | ICD-10-CM | POA: Diagnosis not present

## 2024-06-01 DIAGNOSIS — R600 Localized edema: Secondary | ICD-10-CM | POA: Diagnosis not present

## 2024-06-01 LAB — RAD ONC ARIA SESSION SUMMARY
Course Elapsed Days: 7
Plan Fractions Treated to Date: 6
Plan Prescribed Dose Per Fraction: 1.8 Gy
Plan Total Fractions Prescribed: 25
Plan Total Prescribed Dose: 45 Gy
Reference Point Dosage Given to Date: 10.8 Gy
Reference Point Session Dosage Given: 1.8 Gy
Session Number: 6

## 2024-06-01 LAB — CBC WITH DIFFERENTIAL (CANCER CENTER ONLY)
Abs Immature Granulocytes: 0.04 10*3/uL (ref 0.00–0.07)
Basophils Absolute: 0 10*3/uL (ref 0.0–0.1)
Basophils Relative: 1 %
Eosinophils Absolute: 0.1 10*3/uL (ref 0.0–0.5)
Eosinophils Relative: 1 %
HCT: 28.5 % — ABNORMAL LOW (ref 36.0–46.0)
Hemoglobin: 9.4 g/dL — ABNORMAL LOW (ref 12.0–15.0)
Immature Granulocytes: 1 %
Lymphocytes Relative: 21 %
Lymphs Abs: 1.6 10*3/uL (ref 0.7–4.0)
MCH: 31.5 pg (ref 26.0–34.0)
MCHC: 33 g/dL (ref 30.0–36.0)
MCV: 95.6 fL (ref 80.0–100.0)
Monocytes Absolute: 0.6 10*3/uL (ref 0.1–1.0)
Monocytes Relative: 7 %
Neutro Abs: 5.3 10*3/uL (ref 1.7–7.7)
Neutrophils Relative %: 69 %
Platelet Count: 334 10*3/uL (ref 150–400)
RBC: 2.98 MIL/uL — ABNORMAL LOW (ref 3.87–5.11)
RDW: 14.5 % (ref 11.5–15.5)
WBC Count: 7.6 10*3/uL (ref 4.0–10.5)
nRBC: 0 % (ref 0.0–0.2)

## 2024-06-01 LAB — BASIC METABOLIC PANEL - CANCER CENTER ONLY
Anion gap: 5 (ref 5–15)
BUN: 16 mg/dL (ref 8–23)
CO2: 28 mmol/L (ref 22–32)
Calcium: 8.6 mg/dL — ABNORMAL LOW (ref 8.9–10.3)
Chloride: 102 mmol/L (ref 98–111)
Creatinine: 0.74 mg/dL (ref 0.44–1.00)
GFR, Estimated: >60 mL/min (ref >60)
Glucose, Bld: 80 mg/dL (ref 70–99)
Potassium: 4.2 mmol/L (ref 3.5–5.1)
Sodium: 135 mmol/L (ref 135–145)

## 2024-06-01 MED ORDER — SODIUM CHLORIDE 0.9 % IV SOLN
160.8000 mg | Freq: Once | INTRAVENOUS | Status: AC
  Administered 2024-06-01: 160 mg via INTRAVENOUS
  Filled 2024-06-01: qty 16

## 2024-06-01 MED ORDER — SODIUM CHLORIDE 0.9 % IV SOLN: INTRAVENOUS | Status: DC

## 2024-06-01 MED ORDER — DEXAMETHASONE SODIUM PHOSPHATE 10 MG/ML IJ SOLN
10.0000 mg | Freq: Once | INTRAMUSCULAR | Status: AC
  Administered 2024-06-01: 10 mg via INTRAVENOUS
  Filled 2024-06-01: qty 1

## 2024-06-01 MED ORDER — PALONOSETRON HCL INJECTION 0.25 MG/5ML
0.2500 mg | Freq: Once | INTRAVENOUS | Status: AC
  Administered 2024-06-01: 0.25 mg via INTRAVENOUS
  Filled 2024-06-01: qty 5

## 2024-06-01 NOTE — Assessment & Plan Note (Addendum)
 Her leg edema is slightly due to recent treatment and discontinuation of diuretic therapy Recommend leg elevation and elastic compression hose as well as dietary restriction of salt

## 2024-06-01 NOTE — Assessment & Plan Note (Addendum)
 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, CARIS: P53 mutation, BRAF negative, MSI stable, low tumor mutation burden 35mut/Mb, HER2/neu score 0, negative, PD-L1 0%  Her treatment course is complicated by anemia, mild fluid retention and mild vaginal bleeding Overall she tolerated treatment well Will proceed with treatment without delay

## 2024-06-01 NOTE — Patient Instructions (Signed)
 [facility] - A DEPT OF [facility]  Discharge Instructions: Thank you for choosing [facility] to provide your oncology and hematology care.   If you have a lab appointment with the Cancer Center, please go directly to the Cancer Center and check in at the registration area.   Wear comfortable clothing and clothing appropriate for easy access to any Portacath or PICC line.   We strive to give you quality time with your provider. You may need to reschedule your appointment if you arrive late (15 or more minutes).  Arriving late affects you and other patients whose appointments are after yours.  Also, if you miss three or more appointments without notifying the office, you may be dismissed from the clinic at the provider's discretion.      For prescription refill requests, have your pharmacy contact our office and allow 72 hours for refills to be completed.    Today you received the following chemotherapy and/or immunotherapy agents: Carboplatin      To help prevent nausea and vomiting after your treatment, we encourage you to take your nausea medication as directed.  BELOW ARE SYMPTOMS THAT SHOULD BE REPORTED IMMEDIATELY: *FEVER GREATER THAN 100.4 F (38 C) OR HIGHER *CHILLS OR SWEATING *NAUSEA AND VOMITING THAT IS NOT CONTROLLED WITH YOUR NAUSEA MEDICATION *UNUSUAL SHORTNESS OF BREATH *UNUSUAL BRUISING OR BLEEDING *URINARY PROBLEMS (pain or burning when urinating, or frequent urination) *BOWEL PROBLEMS (unusual diarrhea, constipation, pain near the anus) TENDERNESS IN MOUTH AND THROAT WITH OR WITHOUT PRESENCE OF ULCERS (sore throat, sores in mouth, or a toothache) UNUSUAL RASH, SWELLING OR PAIN  UNUSUAL VAGINAL DISCHARGE OR ITCHING   Items with * indicate a potential emergency and should be followed up as soon as possible or go to the Emergency Department if any problems should occur.  Please show the CHEMOTHERAPY ALERT CARD or  IMMUNOTHERAPY ALERT CARD at check-in to the Emergency Department and triage nurse.  Should you have questions after your visit or need to cancel or reschedule your appointment, please contact [facility] - A DE[facility]  Dept: [phone]  and follow the prompts.  Office hours are 8:00 a.m. to 4:30 p.m. Monday - Friday. Please note that voicemails left after 4:00 p.m. may not be returned until the following business day.  We are closed weekends and major holidays. You have access to a nurse at all times for urgent questions. Please call the main number to the clinic Dept: [phone] and follow the prompts.   For any non-urgent questions, you may also contact your provider using MyChart. We now offer e-Visits for anyone 55 and older to request care online for non-urgent symptoms. For details visit [email or web address].   Also download the MyChart app! Go to the app store, search "MyChart", open the app, select [facility], and log in with your MyChart username and password.

## 2024-06-01 NOTE — Assessment & Plan Note (Addendum)
 Potassium level has improved since discontinuation of indapamide  She will continue low-dose potassium supplement as tolerated

## 2024-06-01 NOTE — Assessment & Plan Note (Addendum)
 This is likely due to recent treatment. The patient denies recent history of bleeding such as epistaxis, hematuria or hematochezia. She is asymptomatic from the anemia. I will observe for now.  She does not require transfusion now. I will continue the chemotherapy at current dose without dosage adjustment.  If the anemia gets progressive worse in the future, I might have to delay her treatment or adjust the chemotherapy dose.

## 2024-06-01 NOTE — Progress Notes (Signed)
 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 The patient denies recent history of bleeding such as epistaxis, hematuria or hematochezia. She is asymptomatic from the anemia. I will observe for now.  She does not require transfusion now. I will continue the chemotherapy at current dose without dosage adjustment.  If the anemia gets progressive worse in the future, I might have to delay her treatment or adjust the chemotherapy dose.  Bilateral leg edema Her leg edema is slightly due to recent treatment and discontinuation of diuretic therapy Recommend leg elevation and elastic compression hose as well as dietary restriction of salt  No orders of the defined types were placed in this encounter.    Almarie Bedford, MD  INTERVAL HISTORY: she returns for treatment follow-up Complications related to previous cycle of chemotherapy included anemia,, fatigue,, and leg edema Overall, she tolerated treatment well  except for fatigue She has minimal vaginal bleeding Denies nausea  PHYSICAL EXAMINATION: ECOG PERFORMANCE STATUS: 1 - Symptomatic but completely ambulatory  No results found for: CAN125    Latest Ref Rng & Units 06/01/2024    7:30 AM 05/26/2024    1:32 PM 05/15/2024    1:49 PM  CBC  WBC 4.0 - 10.5 K/uL 7.6  12.2  13.9   Hemoglobin 12.0 - 15.0 g/dL 9.4  89.5  87.4   Hematocrit 36.0 - 46.0 % 28.5  31.2  35.6   Platelets 150 - 400 K/uL 334  367  391       Chemistry      Component Value Date/Time   NA 135 06/01/2024 0730   NA 138 11/05/2023 0943   K 4.2 06/01/2024 0730   CL 102 06/01/2024 0730   CO2 28 06/01/2024 0730   BUN 16 06/01/2024 0730   BUN 13 11/05/2023 0943   CREATININE 0.74 06/01/2024 0730   CREATININE 0.78 06/12/2014 0950      Component Value Date/Time   CALCIUM 8.6 (L) 06/01/2024 0730   ALKPHOS 61 05/15/2024 1001   AST 27 05/15/2024 1001   AST 22 04/28/2024 1321   ALT 13 05/15/2024 1001   ALT 11 04/28/2024 1321   BILITOT 1.2 05/15/2024 1001   BILITOT 0.5 04/28/2024 1321       Vitals:   06/01/24 0801  BP: (!) 139/55  Pulse: 69  Resp: 15  Temp: (!) 97.4 F (36.3 C)  SpO2: 97%   Filed Weights   06/01/24 0801  Weight: 194 lb (88 kg)   Other relevant data reviewed during this visit included CBC and BMP

## 2024-06-05 ENCOUNTER — Other Ambulatory Visit: Payer: Self-pay

## 2024-06-05 ENCOUNTER — Ambulatory Visit
Admission: RE | Admit: 2024-06-05 | Discharge: 2024-06-05 | Disposition: A | Discharge status: Home / Self Care | Source: Ambulatory Visit | Attending: Radiation Oncology | Admitting: Radiation Oncology

## 2024-06-05 DIAGNOSIS — Z510 Encounter for antineoplastic radiation therapy: Secondary | ICD-10-CM | POA: Diagnosis not present

## 2024-06-05 DIAGNOSIS — C52 Malignant neoplasm of vagina: Secondary | ICD-10-CM | POA: Diagnosis not present

## 2024-06-05 LAB — RAD ONC ARIA SESSION SUMMARY
Course Elapsed Days: 11
Plan Fractions Treated to Date: 7
Plan Prescribed Dose Per Fraction: 1.8 Gy
Plan Total Fractions Prescribed: 25
Plan Total Prescribed Dose: 45 Gy
Reference Point Dosage Given to Date: 12.6 Gy
Reference Point Session Dosage Given: 1.8 Gy
Session Number: 7

## 2024-06-06 ENCOUNTER — Other Ambulatory Visit: Payer: Self-pay

## 2024-06-06 ENCOUNTER — Ambulatory Visit
Admission: RE | Admit: 2024-06-06 | Discharge: 2024-06-06 | Disposition: A | Discharge status: Home / Self Care | Source: Ambulatory Visit | Attending: Radiation Oncology | Admitting: Radiation Oncology

## 2024-06-06 DIAGNOSIS — Z510 Encounter for antineoplastic radiation therapy: Secondary | ICD-10-CM | POA: Diagnosis not present

## 2024-06-06 DIAGNOSIS — C52 Malignant neoplasm of vagina: Secondary | ICD-10-CM | POA: Diagnosis not present

## 2024-06-06 LAB — RAD ONC ARIA SESSION SUMMARY
Course Elapsed Days: 12
Plan Fractions Treated to Date: 8
Plan Prescribed Dose Per Fraction: 1.8 Gy
Plan Total Fractions Prescribed: 25
Plan Total Prescribed Dose: 45 Gy
Reference Point Dosage Given to Date: 14.4 Gy
Reference Point Session Dosage Given: 1.8 Gy
Session Number: 8

## 2024-06-07 ENCOUNTER — Ambulatory Visit
Admission: RE | Admit: 2024-06-07 | Discharge: 2024-06-07 | Disposition: A | Discharge status: Home / Self Care | Source: Ambulatory Visit | Attending: Radiation Oncology | Admitting: Radiation Oncology

## 2024-06-07 ENCOUNTER — Other Ambulatory Visit: Payer: Self-pay

## 2024-06-07 DIAGNOSIS — C52 Malignant neoplasm of vagina: Secondary | ICD-10-CM | POA: Diagnosis not present

## 2024-06-07 DIAGNOSIS — Z510 Encounter for antineoplastic radiation therapy: Secondary | ICD-10-CM | POA: Diagnosis not present

## 2024-06-07 LAB — RAD ONC ARIA SESSION SUMMARY
Course Elapsed Days: 13
Plan Fractions Treated to Date: 9
Plan Prescribed Dose Per Fraction: 1.8 Gy
Plan Total Fractions Prescribed: 25
Plan Total Prescribed Dose: 45 Gy
Reference Point Dosage Given to Date: 16.2 Gy
Reference Point Session Dosage Given: 1.8 Gy
Session Number: 9

## 2024-06-08 ENCOUNTER — Other Ambulatory Visit: Payer: Self-pay

## 2024-06-08 ENCOUNTER — Ambulatory Visit
Admission: RE | Admit: 2024-06-08 | Discharge: 2024-06-08 | Disposition: A | Discharge status: Home / Self Care | Source: Ambulatory Visit | Attending: Radiation Oncology | Admitting: Radiation Oncology

## 2024-06-08 DIAGNOSIS — C52 Malignant neoplasm of vagina: Secondary | ICD-10-CM | POA: Diagnosis not present

## 2024-06-08 DIAGNOSIS — Z510 Encounter for antineoplastic radiation therapy: Secondary | ICD-10-CM | POA: Diagnosis not present

## 2024-06-08 LAB — RAD ONC ARIA SESSION SUMMARY
Course Elapsed Days: 14
Plan Fractions Treated to Date: 10
Plan Prescribed Dose Per Fraction: 1.8 Gy
Plan Total Fractions Prescribed: 25
Plan Total Prescribed Dose: 45 Gy
Reference Point Dosage Given to Date: 18 Gy
Reference Point Session Dosage Given: 1.8 Gy
Session Number: 10

## 2024-06-09 ENCOUNTER — Inpatient Hospital Stay: Admitting: Hematology and Oncology

## 2024-06-09 ENCOUNTER — Other Ambulatory Visit: Payer: Self-pay

## 2024-06-09 ENCOUNTER — Encounter: Payer: Self-pay | Admitting: Hematology and Oncology

## 2024-06-09 ENCOUNTER — Inpatient Hospital Stay

## 2024-06-09 ENCOUNTER — Ambulatory Visit
Admission: RE | Admit: 2024-06-09 | Discharge: 2024-06-09 | Disposition: A | Discharge status: Home / Self Care | Source: Ambulatory Visit | Attending: Radiation Oncology | Admitting: Radiation Oncology

## 2024-06-09 VITALS — BP 139/52 | HR 98 | Temp 97.4°F | Resp 18 | Ht 65.0 in | Wt 193.8 lb

## 2024-06-09 DIAGNOSIS — C52 Malignant neoplasm of vagina: Secondary | ICD-10-CM | POA: Diagnosis not present

## 2024-06-09 DIAGNOSIS — T451X5A Adverse effect of antineoplastic and immunosuppressive drugs, initial encounter: Secondary | ICD-10-CM | POA: Diagnosis not present

## 2024-06-09 DIAGNOSIS — Z510 Encounter for antineoplastic radiation therapy: Secondary | ICD-10-CM | POA: Diagnosis not present

## 2024-06-09 DIAGNOSIS — D6481 Anemia due to antineoplastic chemotherapy: Secondary | ICD-10-CM

## 2024-06-09 LAB — RAD ONC ARIA SESSION SUMMARY
Course Elapsed Days: 15
Plan Fractions Treated to Date: 11
Plan Prescribed Dose Per Fraction: 1.8 Gy
Plan Total Fractions Prescribed: 25
Plan Total Prescribed Dose: 45 Gy
Reference Point Dosage Given to Date: 19.8 Gy
Reference Point Session Dosage Given: 1.8 Gy
Session Number: 11

## 2024-06-09 LAB — BASIC METABOLIC PANEL - CANCER CENTER ONLY
Anion gap: 6 (ref 5–15)
BUN: 12 mg/dL (ref 8–23)
CO2: 28 mmol/L (ref 22–32)
Calcium: 8.7 mg/dL — ABNORMAL LOW (ref 8.9–10.3)
Chloride: 102 mmol/L (ref 98–111)
Creatinine: 0.73 mg/dL (ref 0.44–1.00)
GFR, Estimated: >60 mL/min (ref >60)
Glucose, Bld: 97 mg/dL (ref 70–99)
Potassium: 4 mmol/L (ref 3.5–5.1)
Sodium: 136 mmol/L (ref 135–145)

## 2024-06-09 LAB — CBC WITH DIFFERENTIAL (CANCER CENTER ONLY)
Abs Immature Granulocytes: 0.02 K/uL (ref 0.00–0.07)
Basophils Absolute: 0 K/uL (ref 0.0–0.1)
Basophils Relative: 0 %
Eosinophils Absolute: 0 K/uL (ref 0.0–0.5)
Eosinophils Relative: 1 %
HCT: 26.3 % — ABNORMAL LOW (ref 36.0–46.0)
Hemoglobin: 8.8 g/dL — ABNORMAL LOW (ref 12.0–15.0)
Immature Granulocytes: 0 %
Lymphocytes Relative: 25 %
Lymphs Abs: 1.2 K/uL (ref 0.7–4.0)
MCH: 31.9 pg (ref 26.0–34.0)
MCHC: 33.5 g/dL (ref 30.0–36.0)
MCV: 95.3 fL (ref 80.0–100.0)
Monocytes Absolute: 0.4 K/uL (ref 0.1–1.0)
Monocytes Relative: 8 %
Neutro Abs: 3.1 K/uL (ref 1.7–7.7)
Neutrophils Relative %: 66 %
Platelet Count: 155 K/uL (ref 150–400)
RBC: 2.76 MIL/uL — ABNORMAL LOW (ref 3.87–5.11)
RDW: 15.4 % (ref 11.5–15.5)
WBC Count: 4.8 K/uL (ref 4.0–10.5)
nRBC: 0 % (ref 0.0–0.2)

## 2024-06-09 MED ORDER — SODIUM CHLORIDE 0.9% FLUSH
10.0000 mL | Freq: Once | INTRAVENOUS | Status: AC
Start: 2024-06-09 — End: 2024-06-09
  Administered 2024-06-09: 10 mL

## 2024-06-09 MED ORDER — SODIUM CHLORIDE 0.9% FLUSH
10.0000 mL | Freq: Once | INTRAVENOUS | Status: AC
  Administered 2024-06-09: 10 mL

## 2024-06-09 MED ORDER — HEPARIN SOD (PORK) LOCK FLUSH 100 UNIT/ML IV SOLN
500.0000 [IU] | Freq: Once | INTRAVENOUS | Status: AC
  Administered 2024-06-09: 500 [IU]

## 2024-06-09 NOTE — Assessment & Plan Note (Addendum)
 She has slight worsening anemia due to chemotherapy She does not need blood transfusion Per discussion, I plan to defer treatment by a week to allow recovery

## 2024-06-09 NOTE — Assessment & Plan Note (Addendum)
 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, CARIS: P53 mutation, BRAF negative, MSI stable, low tumor mutation burden 2mut/Mb, HER2/neu score 0, negative, PD-L1 0%  Her treatment course is complicated by anemia, mild fluid retention and mild vaginal bleeding She is profoundly symptomatic with a drop of her blood count this week in comparison to last week We discussed risk and benefits of deferring treatment by 1 week to allow a treatment break and she agreed

## 2024-06-09 NOTE — Progress Notes (Signed)
 [facility] OFFICE PROGRESS NOTE  Patient Care Team: [name], MD as PCP - General (Family Medicine) [name], Mihai, MD as PCP - Cardiology (Cardiology) Shaaron Lamar HERO, MD as Consulting Physician (Gastroenterology)  Assessment & Plan Vaginal cancer [facility]) 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, CARIS: P53 mutation, BRAF negative, MSI stable, low tumor mutation burden 90mut/Mb, HER2/neu score 0, negative, PD-L1 0%  Her treatment course is complicated by anemia, mild fluid retention and mild vaginal bleeding She is profoundly symptomatic with a drop of her blood count this week in comparison to last week We discussed risk and benefits of deferring treatment by 1 week to allow a treatment break and she agreed Anemia due to antineoplastic chemotherapy She has slight worsening anemia due to chemotherapy She does not need blood transfusion Per discussion, I plan to defer treatment by a week to allow recovery  Orders Placed This Encounter  Procedures   CBC with Differential (Cancer Center Only)    Standing Status:   Future    Expected Date:   06/16/2024    Expiration Date:   06/16/2025   Basic Metabolic Panel - Cancer Center Only    Standing Status:   Future    Expected Date:   06/16/2024    Expiration Date:   06/16/2025     Sheena Bedford, MD  INTERVAL HISTORY: she returns for treatment follow-up Complications related to previous cycle of chemotherapy included anemia,, fatigue,, and shortness of breath She has minor vaginal spotting According to granddaughter, she has been sleeping a lot due to excessive fatigue Denies nausea or changes in bowel habits  PHYSICAL EXAMINATION: ECOG PERFORMANCE STATUS: 2 - Symptomatic, <50% confined to bed  No results found for: CAN125     Latest Ref Rng & Units 06/09/2024   10:34 AM 06/01/2024    7:30 AM 05/26/2024    1:32 PM  CBC  WBC 4.0 - 10.5 K/uL 4.8  7.6  12.2   Hemoglobin 12.0 - 15.0 g/dL 8.8  9.4  89.5   Hematocrit 36.0 - 46.0 % 26.3  28.5  31.2   Platelets 150 - 400 K/uL 155  334  367       Chemistry      Component Value Date/Time   NA 136 06/09/2024 1034   NA 138 11/05/2023 0943   K 4.0 06/09/2024 1034   CL 102 06/09/2024 1034   CO2 28 06/09/2024 1034   BUN 12 06/09/2024 1034   BUN 13 11/05/2023 0943   CREATININE 0.73 06/09/2024 1034   CREATININE 0.78 06/12/2014 0950      Component Value Date/Time   CALCIUM 8.7 (L) 06/09/2024 1034   ALKPHOS 61 05/15/2024 1001   AST 27 05/15/2024 1001   AST 22 04/28/2024 1321   ALT 13 05/15/2024 1001   ALT 11 04/28/2024 1321   BILITOT 1.2 05/15/2024 1001   BILITOT 0.5 04/28/2024 1321       Vitals:   06/09/24 1058  BP: (!) 139/52  Pulse: 98  Resp: 18  Temp: (!) 97.4 F (36.3 C)  SpO2: 98%   Filed Weights   06/09/24 1058  Weight: 193 lb 12.8 oz (87.9 kg)   Other relevant data reviewed during this visit included CBC and CMP

## 2024-06-12 ENCOUNTER — Ambulatory Visit: Admitting: Family Medicine

## 2024-06-12 ENCOUNTER — Ambulatory Visit

## 2024-06-12 ENCOUNTER — Telehealth: Payer: Self-pay

## 2024-06-12 VITALS — BP 122/72 | HR 75 | Temp 97.5°F | Ht 65.0 in | Wt 190.8 lb

## 2024-06-12 DIAGNOSIS — T451X5D Adverse effect of antineoplastic and immunosuppressive drugs, subsequent encounter: Secondary | ICD-10-CM | POA: Diagnosis not present

## 2024-06-12 DIAGNOSIS — C52 Malignant neoplasm of vagina: Secondary | ICD-10-CM

## 2024-06-12 DIAGNOSIS — M5090 Cervical disc disorder, unspecified, unspecified cervical region: Secondary | ICD-10-CM

## 2024-06-12 DIAGNOSIS — D6481 Anemia due to antineoplastic chemotherapy: Secondary | ICD-10-CM | POA: Diagnosis not present

## 2024-06-12 NOTE — Progress Notes (Signed)
   Subjective:    Patient ID: [name], female    DOB: 1936/04/13, 88 y.o.   MRN: [id number]  HPI PT comes in today for knot in left side of neck with pain going to middle of neck. Pt states the pain is very bad with movement. Medications and allergies reviewed. Discussed the use of AI scribe software for clinical note transcription with the patient, who gave verbal consent to proceed.  History of Present Illness   The patient presents with neck pain and difficulty with mobility.  She experienced a sudden onset of sharp neck pain on the upper left part of her neck last night. She attempted self-care with IC hot and Tylenol , which provided some relief. She has a long-standing issue with limited mobility when turning her head to the left, which she attributes to possible arthritis. This limitation affects her ability to check for oncoming traffic when driving.  She is currently undergoing radiation and chemotherapy treatments. During radiation treatments, she lies on a metal table with pillows for elevation, which she finds not too uncomfortable. She missed a treatment due to low energy levels. She has two more chemotherapy sessions and expects to complete radiation by the first week of August. She experiences fatigue and difficulty maintaining her usual activities, stating 'it's done a number on me this past week.'  Her blood count was noted to be 8.8, and she missed a treatment due to low energy levels. She has not attended church or gone out much due to her condition.  She has an upcoming appointment in [facility] for a consultation regarding a potential procedure involving a laser beam treatment. She is uncertain about the details but understands it may involve a short hospital stay if she is a candidate.  She prefers to sleep in a chair rather than a bed, as lying down in bed causes her to wake up. She has a pool but has not used it much due to low energy levels.       Review of  Systems     Objective:   Physical Exam General-in no acute distress Eyes-no discharge Lungs-respiratory rate normal, CTA CV-no murmurs,RRR Extremities skin warm dry no edema Neuro grossly normal Behavior normal, alert Good range of motion of the neck minimal tenderness in the superior portion on the left side       Assessment & Plan:  Cancer Treatment Undergoing chemotherapy and radiation. Missed treatments due to low energy and neck pain. Consultation at [facility] for potential laser beam treatment pending candidacy assessment. - Continue chemotherapy and radiation as tolerated. - Attend [facility] consultation on July 17 for laser treatment candidacy. - Discuss surgical intervention if a candidate.  Neck Pain Acute sharp neck pain likely musculoskeletal. Muscle relaxers not advised due to side effects. - Recommend Tylenol  as needed. - Suggest warm compresses or heating pad with caution. - Consider homemade rice bag for heat therapy. - Consider plain x-rays if symptoms persist.  Anemia Hemoglobin at 8.8 g/dL, likely related to chemotherapy and radiation. - Monitor hemoglobin with upcoming labs. - Discuss blood transfusion if hemoglobin drops.  General Health Maintenance Advised caution against germ exposure, especially during viral season. - Limit exposure to large crowds and public places.  Follow-up Regular follow-up scheduled for August. Discussed MyChart access for family member. - Maintain August follow-up appointment. - Coordinate MyChart access for family member.

## 2024-06-12 NOTE — Telephone Encounter (Signed)
 Patient called in to cancel her radiation treatment. Amanda from [facility] made aware.

## 2024-06-13 ENCOUNTER — Ambulatory Visit
Admission: RE | Admit: 2024-06-13 | Discharge: 2024-06-13 | Disposition: A | Discharge status: Home / Self Care | Source: Ambulatory Visit | Attending: Radiation Oncology | Admitting: Radiation Oncology

## 2024-06-13 ENCOUNTER — Other Ambulatory Visit: Payer: Self-pay

## 2024-06-13 DIAGNOSIS — Z510 Encounter for antineoplastic radiation therapy: Secondary | ICD-10-CM | POA: Diagnosis not present

## 2024-06-13 DIAGNOSIS — C52 Malignant neoplasm of vagina: Secondary | ICD-10-CM | POA: Diagnosis not present

## 2024-06-13 LAB — RAD ONC ARIA SESSION SUMMARY
Course Elapsed Days: 19
Plan Fractions Treated to Date: 12
Plan Prescribed Dose Per Fraction: 1.8 Gy
Plan Total Fractions Prescribed: 25
Plan Total Prescribed Dose: 45 Gy
Reference Point Dosage Given to Date: 21.6 Gy
Reference Point Session Dosage Given: 1.8 Gy
Session Number: 12

## 2024-06-14 ENCOUNTER — Ambulatory Visit
Admission: RE | Admit: 2024-06-14 | Discharge: 2024-06-14 | Disposition: A | Discharge status: Home / Self Care | Source: Ambulatory Visit | Attending: Radiation Oncology | Admitting: Radiation Oncology

## 2024-06-14 ENCOUNTER — Other Ambulatory Visit: Payer: Self-pay

## 2024-06-14 DIAGNOSIS — Z510 Encounter for antineoplastic radiation therapy: Secondary | ICD-10-CM | POA: Diagnosis not present

## 2024-06-14 DIAGNOSIS — C52 Malignant neoplasm of vagina: Secondary | ICD-10-CM | POA: Diagnosis not present

## 2024-06-14 LAB — RAD ONC ARIA SESSION SUMMARY
Course Elapsed Days: 20
Plan Fractions Treated to Date: 13
Plan Prescribed Dose Per Fraction: 1.8 Gy
Plan Total Fractions Prescribed: 25
Plan Total Prescribed Dose: 45 Gy
Reference Point Dosage Given to Date: 23.4 Gy
Reference Point Session Dosage Given: 1.8 Gy
Session Number: 13

## 2024-06-15 ENCOUNTER — Ambulatory Visit

## 2024-06-15 DIAGNOSIS — C52 Malignant neoplasm of vagina: Secondary | ICD-10-CM | POA: Diagnosis not present

## 2024-06-16 ENCOUNTER — Other Ambulatory Visit: Payer: Self-pay

## 2024-06-16 ENCOUNTER — Ambulatory Visit
Admission: RE | Admit: 2024-06-16 | Discharge: 2024-06-16 | Disposition: A | Discharge status: Home / Self Care | Source: Ambulatory Visit | Attending: Radiation Oncology | Admitting: Radiation Oncology

## 2024-06-16 ENCOUNTER — Inpatient Hospital Stay: Admitting: Hematology and Oncology

## 2024-06-16 ENCOUNTER — Encounter: Payer: Self-pay | Admitting: Hematology and Oncology

## 2024-06-16 ENCOUNTER — Telehealth: Payer: Self-pay | Admitting: Oncology

## 2024-06-16 ENCOUNTER — Inpatient Hospital Stay

## 2024-06-16 VITALS — BP 134/55 | HR 69 | Temp 97.5°F | Resp 17 | Ht 65.0 in | Wt 191.0 lb

## 2024-06-16 DIAGNOSIS — Z510 Encounter for antineoplastic radiation therapy: Secondary | ICD-10-CM | POA: Diagnosis not present

## 2024-06-16 DIAGNOSIS — D61818 Other pancytopenia: Secondary | ICD-10-CM | POA: Diagnosis not present

## 2024-06-16 DIAGNOSIS — C52 Malignant neoplasm of vagina: Secondary | ICD-10-CM | POA: Diagnosis not present

## 2024-06-16 DIAGNOSIS — R600 Localized edema: Secondary | ICD-10-CM | POA: Diagnosis not present

## 2024-06-16 LAB — BASIC METABOLIC PANEL - CANCER CENTER ONLY
Anion gap: 4 — ABNORMAL LOW (ref 5–15)
BUN: 12 mg/dL (ref 8–23)
CO2: 30 mmol/L (ref 22–32)
Calcium: 8.8 mg/dL — ABNORMAL LOW (ref 8.9–10.3)
Chloride: 102 mmol/L (ref 98–111)
Creatinine: 0.75 mg/dL (ref 0.44–1.00)
GFR, Estimated: >60 mL/min (ref >60)
Glucose, Bld: 93 mg/dL (ref 70–99)
Potassium: 4 mmol/L (ref 3.5–5.1)
Sodium: 136 mmol/L (ref 135–145)

## 2024-06-16 LAB — CBC WITH DIFFERENTIAL (CANCER CENTER ONLY)
Abs Immature Granulocytes: 0.01 K/uL (ref 0.00–0.07)
Basophils Absolute: 0 K/uL (ref 0.0–0.1)
Basophils Relative: 0 %
Eosinophils Absolute: 0 K/uL (ref 0.0–0.5)
Eosinophils Relative: 1 %
HCT: 26.7 % — ABNORMAL LOW (ref 36.0–46.0)
Hemoglobin: 8.8 g/dL — ABNORMAL LOW (ref 12.0–15.0)
Immature Granulocytes: 0 %
Lymphocytes Relative: 23 %
Lymphs Abs: 1 K/uL (ref 0.7–4.0)
MCH: 31.8 pg (ref 26.0–34.0)
MCHC: 33 g/dL (ref 30.0–36.0)
MCV: 96.4 fL (ref 80.0–100.0)
Monocytes Absolute: 0.3 K/uL (ref 0.1–1.0)
Monocytes Relative: 8 %
Neutro Abs: 2.9 K/uL (ref 1.7–7.7)
Neutrophils Relative %: 68 %
Platelet Count: 132 K/uL — ABNORMAL LOW (ref 150–400)
RBC: 2.77 MIL/uL — ABNORMAL LOW (ref 3.87–5.11)
RDW: 15.9 % — ABNORMAL HIGH (ref 11.5–15.5)
WBC Count: 4.3 K/uL (ref 4.0–10.5)
nRBC: 0 % (ref 0.0–0.2)

## 2024-06-16 LAB — RAD ONC ARIA SESSION SUMMARY
Course Elapsed Days: 22
Plan Fractions Treated to Date: 14
Plan Prescribed Dose Per Fraction: 1.8 Gy
Plan Total Fractions Prescribed: 25
Plan Total Prescribed Dose: 45 Gy
Reference Point Dosage Given to Date: 25.2 Gy
Reference Point Session Dosage Given: 1.8 Gy
Session Number: 14

## 2024-06-16 MED ORDER — SODIUM CHLORIDE 0.9 % IV SOLN
INTRAVENOUS | Status: DC
Start: 2024-06-16 — End: 2024-06-16

## 2024-06-16 MED ORDER — SODIUM CHLORIDE 0.9% FLUSH
10.0000 mL | Freq: Once | INTRAVENOUS | Status: AC
Start: 2024-06-16 — End: 2024-06-16
  Administered 2024-06-16: 10 mL

## 2024-06-16 MED ORDER — PALONOSETRON HCL INJECTION 0.25 MG/5ML
0.2500 mg | Freq: Once | INTRAVENOUS | Status: AC
  Administered 2024-06-16: 0.25 mg via INTRAVENOUS
  Filled 2024-06-16: qty 5

## 2024-06-16 MED ORDER — SODIUM CHLORIDE 0.9 % IV SOLN
160.8000 mg | Freq: Once | INTRAVENOUS | Status: AC
  Administered 2024-06-16: 160 mg via INTRAVENOUS
  Filled 2024-06-16: qty 16

## 2024-06-16 MED ORDER — HEPARIN SOD (PORK) LOCK FLUSH 100 UNIT/ML IV SOLN
500.0000 [IU] | Freq: Once | INTRAVENOUS | Status: AC | PRN
  Administered 2024-06-16: 500 [IU]

## 2024-06-16 MED ORDER — DEXAMETHASONE SODIUM PHOSPHATE 10 MG/ML IJ SOLN
10.0000 mg | Freq: Once | INTRAMUSCULAR | Status: AC
  Administered 2024-06-16: 10 mg via INTRAVENOUS
  Filled 2024-06-16: qty 1

## 2024-06-16 MED ORDER — SODIUM CHLORIDE 0.9% FLUSH
10.0000 mL | INTRAVENOUS | Status: DC | PRN
  Administered 2024-06-16: 10 mL

## 2024-06-16 NOTE — Telephone Encounter (Signed)
 Left a message for Dr. Vera nurse regarding MRI on 07/23/24.  Requested a return call.

## 2024-06-16 NOTE — Assessment & Plan Note (Addendum)
 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, CARIS: P53 mutation, BRAF negative, MSI stable, low tumor mutation burden 40mut/Mb, HER2/neu score 0, negative, PD-L1 0%  Her treatment course is complicated by anemia, mild fluid retention and mild vaginal bleeding Treatment cycle #4 was delayed until today due to recent severe anemia Review of her labs show persistent anemia and mild thrombocytopenia but she feels good and felt ready to resume chemotherapy After today's treatment, I will delay her next treatment for 2 weeks I have reviewed recommendation from [facility] and will make sure she gets her appointment scheduled correctly

## 2024-06-16 NOTE — Assessment & Plan Note (Addendum)
 Her leg edema is slightly due to recent treatment and discontinuation of diuretic therapy Recommend leg elevation and elastic compression hose as well as dietary restriction of salt

## 2024-06-16 NOTE — Assessment & Plan Note (Addendum)
 Due to recent treatment She is not symptomatic Monitor closely There is no contraindication for her to continue on anticoagulation therapy as long as her platelet count is above 50,000

## 2024-06-16 NOTE — Patient Instructions (Signed)
 [facility] - A DEPT OF [facility]  Discharge Instructions: Thank you for choosing [facility] to provide your oncology and hematology care.   If you have a lab appointment with the Cancer Center, please go directly to the Cancer Center and check in at the registration area.   Wear comfortable clothing and clothing appropriate for easy access to any Portacath or PICC line.   We strive to give you quality time with your provider. You may need to reschedule your appointment if you arrive late (15 or more minutes).  Arriving late affects you and other patients whose appointments are after yours.  Also, if you miss three or more appointments without notifying the office, you may be dismissed from the clinic at the provider's discretion.      For prescription refill requests, have your pharmacy contact our office and allow 72 hours for refills to be completed.    Today you received the following chemotherapy and/or immunotherapy agents: Carboplatin      To help prevent nausea and vomiting after your treatment, we encourage you to take your nausea medication as directed.  BELOW ARE SYMPTOMS THAT SHOULD BE REPORTED IMMEDIATELY: *FEVER GREATER THAN 100.4 F (38 C) OR HIGHER *CHILLS OR SWEATING *NAUSEA AND VOMITING THAT IS NOT CONTROLLED WITH YOUR NAUSEA MEDICATION *UNUSUAL SHORTNESS OF BREATH *UNUSUAL BRUISING OR BLEEDING *URINARY PROBLEMS (pain or burning when urinating, or frequent urination) *BOWEL PROBLEMS (unusual diarrhea, constipation, pain near the anus) TENDERNESS IN MOUTH AND THROAT WITH OR WITHOUT PRESENCE OF ULCERS (sore throat, sores in mouth, or a toothache) UNUSUAL RASH, SWELLING OR PAIN  UNUSUAL VAGINAL DISCHARGE OR ITCHING   Items with * indicate a potential emergency and should be followed up as soon as possible or go to the Emergency Department if any problems should occur.  Please show the CHEMOTHERAPY ALERT CARD or  IMMUNOTHERAPY ALERT CARD at check-in to the Emergency Department and triage nurse.  Should you have questions after your visit or need to cancel or reschedule your appointment, please contact [facility] - A DE[facility]  Dept: [phone]  and follow the prompts.  Office hours are 8:00 a.m. to 4:30 p.m. Monday - Friday. Please note that voicemails left after 4:00 p.m. may not be returned until the following business day.  We are closed weekends and major holidays. You have access to a nurse at all times for urgent questions. Please call the main number to the clinic Dept: [phone] and follow the prompts.   For any non-urgent questions, you may also contact your provider using MyChart. We now offer e-Visits for anyone 55 and older to request care online for non-urgent symptoms. For details visit [email or web address].   Also download the MyChart app! Go to the app store, search "MyChart", open the app, select [facility], and log in with your MyChart username and password.

## 2024-06-16 NOTE — Progress Notes (Signed)
 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, CARIS: P53 mutation, BRAF negative, MSI stable, low tumor mutation burden 4mut/Mb, HER2/neu score 0, negative, PD-L1 0%  Her treatment course is complicated by anemia, mild fluid retention and mild vaginal bleeding Treatment cycle #4 was delayed until today due to recent severe anemia Review of her labs show persistent anemia and mild thrombocytopenia but she feels good and felt ready to resume chemotherapy After today's treatment, I will delay her next treatment for 2 weeks I have reviewed recommendation from [facility] and will make sure she gets her appointment scheduled correctly Bilateral leg edema Her leg edema is slightly due to recent treatment and discontinuation of diuretic therapy Recommend leg elevation and elastic compression hose as well as dietary restriction of salt Pancytopenia, acquired (HCC) Due to recent treatment She is not symptomatic Monitor closely There is no contraindication for her to continue on anticoagulation therapy as long as her platelet count is above 50,000  No orders of the defined types were placed in this encounter.    Almarie Bedford, MD  INTERVAL HISTORY: she returns for treatment follow-up Complications related to previous cycle of chemotherapy included pancytopenia, and leg edema She was recently seen at [facility] MRI is recommended She is actually scheduled for MRI in 2 weeks Her appointment to follow at [facility] was not clear and we will get  navigator to help sorted out Despite persistent anemia, her energy level is fair She denies recent bleeding No recent chest pain Chronic bilateral lower extremity edema is stable  PHYSICAL EXAMINATION: ECOG PERFORMANCE STATUS: 1 - Symptomatic but completely ambulatory  No results found for: CAN125    Latest Ref Rng & Units 06/16/2024   10:28 AM 06/09/2024   10:34 AM 06/01/2024    7:30 AM  CBC  WBC 4.0 - 10.5 K/uL 4.3  4.8  7.6   Hemoglobin 12.0 - 15.0 g/dL 8.8  8.8  9.4   Hematocrit 36.0 - 46.0 % 26.7  26.3  28.5   Platelets 150 - 400 K/uL 132  155  334       Chemistry      Component Value Date/Time   NA 136 06/16/2024 1028   NA 138 11/05/2023 0943   K 4.0 06/16/2024 1028   CL 102 06/16/2024 1028   CO2 30 06/16/2024 1028   BUN 12 06/16/2024 1028   BUN 13 11/05/2023 0943   CREATININE 0.75 06/16/2024 1028   CREATININE 0.78 06/12/2014 0950      Component Value Date/Time   CALCIUM 8.8 (L) 06/16/2024 1028   ALKPHOS 61 05/15/2024 1001   AST 27 05/15/2024 1001   AST 22 04/28/2024 1321   ALT 13 05/15/2024 1001   ALT 11 04/28/2024 1321   BILITOT 1.2 05/15/2024 1001   BILITOT 0.5 04/28/2024 1321       Vitals:   06/16/24 1047  BP: (!) 134/55  Pulse: 69  Resp: 17  Temp: (!) 97.5 F (36.4 C)  SpO2: 97%   Filed Weights   06/16/24 1047  Weight: 191 lb (86.6 kg)   Other relevant data reviewed during this  visit included CBC and CMP, notes from [facility]

## 2024-06-19 ENCOUNTER — Other Ambulatory Visit: Payer: Self-pay

## 2024-06-19 ENCOUNTER — Ambulatory Visit
Admission: RE | Admit: 2024-06-19 | Discharge: 2024-06-19 | Disposition: A | Discharge status: Home / Self Care | Source: Ambulatory Visit | Attending: Radiation Oncology | Admitting: Radiation Oncology

## 2024-06-19 ENCOUNTER — Encounter: Payer: Self-pay | Admitting: Hematology and Oncology

## 2024-06-19 DIAGNOSIS — C52 Malignant neoplasm of vagina: Secondary | ICD-10-CM | POA: Diagnosis not present

## 2024-06-19 DIAGNOSIS — Z510 Encounter for antineoplastic radiation therapy: Secondary | ICD-10-CM | POA: Diagnosis not present

## 2024-06-19 LAB — RAD ONC ARIA SESSION SUMMARY
Course Elapsed Days: 25
Plan Fractions Treated to Date: 15
Plan Prescribed Dose Per Fraction: 1.8 Gy
Plan Total Fractions Prescribed: 25
Plan Total Prescribed Dose: 45 Gy
Reference Point Dosage Given to Date: 27 Gy
Reference Point Session Dosage Given: 1.8 Gy
Session Number: 15

## 2024-06-19 NOTE — Telephone Encounter (Signed)
 Dr. Jacklin called regarding the MRI pelvis scheduled for 06/30/24.  She would like [name] to have the MRI around 07/10/24 and there was not an opening at [facility] until 07/23/24.  Advised her I will work on getting the MRI rescheduled at [facility] for 07/10/24.  Dr. Jacklin said she will see [name] back in the office on 8/14 or 8/19.  Called Brooke (granddaughter) and let her know the new date and time for the MRI and also that Dr. Vera office should be reaching out with a new appointment with her office.

## 2024-06-20 ENCOUNTER — Ambulatory Visit
Admission: RE | Admit: 2024-06-20 | Discharge: 2024-06-20 | Disposition: A | Discharge status: Home / Self Care | Source: Ambulatory Visit | Attending: Radiation Oncology | Admitting: Radiation Oncology

## 2024-06-20 ENCOUNTER — Other Ambulatory Visit: Payer: Self-pay

## 2024-06-20 DIAGNOSIS — C52 Malignant neoplasm of vagina: Secondary | ICD-10-CM | POA: Diagnosis not present

## 2024-06-20 DIAGNOSIS — Z510 Encounter for antineoplastic radiation therapy: Secondary | ICD-10-CM | POA: Diagnosis not present

## 2024-06-20 LAB — RAD ONC ARIA SESSION SUMMARY
Course Elapsed Days: 26
Plan Fractions Treated to Date: 16
Plan Prescribed Dose Per Fraction: 1.8 Gy
Plan Total Fractions Prescribed: 25
Plan Total Prescribed Dose: 45 Gy
Reference Point Dosage Given to Date: 28.8 Gy
Reference Point Session Dosage Given: 1.8 Gy
Session Number: 16

## 2024-06-21 ENCOUNTER — Ambulatory Visit

## 2024-06-21 ENCOUNTER — Telehealth: Payer: Self-pay | Admitting: Oncology

## 2024-06-21 NOTE — Telephone Encounter (Signed)
 Left a message with Dr. Jacklin at [facility] advising that Otis's MRI has been rescheduled to 07/10/24.

## 2024-06-22 ENCOUNTER — Telehealth: Payer: Self-pay | Admitting: Oncology

## 2024-06-22 ENCOUNTER — Ambulatory Visit
Admission: RE | Admit: 2024-06-22 | Discharge: 2024-06-22 | Disposition: A | Discharge status: Home / Self Care | Source: Ambulatory Visit | Attending: Radiation Oncology | Admitting: Radiation Oncology

## 2024-06-22 ENCOUNTER — Other Ambulatory Visit: Payer: Self-pay

## 2024-06-22 DIAGNOSIS — Z510 Encounter for antineoplastic radiation therapy: Secondary | ICD-10-CM | POA: Diagnosis not present

## 2024-06-22 DIAGNOSIS — C52 Malignant neoplasm of vagina: Secondary | ICD-10-CM | POA: Diagnosis not present

## 2024-06-22 LAB — RAD ONC ARIA SESSION SUMMARY
Course Elapsed Days: 28
Plan Fractions Treated to Date: 17
Plan Prescribed Dose Per Fraction: 1.8 Gy
Plan Total Fractions Prescribed: 25
Plan Total Prescribed Dose: 45 Gy
Reference Point Dosage Given to Date: 30.6 Gy
Reference Point Session Dosage Given: 1.8 Gy
Session Number: 17

## 2024-06-22 NOTE — Telephone Encounter (Signed)
 Called [name] and let her know that we have scheduled a telephone visit with Dr. Viktoria tomorrow to discuss interstitial brachytherapy.  Advised she would call any time tomorrow afternoon.  [name] verbalized understanding and would like Dr. Viktoria to call [place] on her home telephone number.

## 2024-06-23 ENCOUNTER — Ambulatory Visit
Admission: RE | Admit: 2024-06-23 | Discharge: 2024-06-23 | Disposition: A | Discharge status: Home / Self Care | Source: Ambulatory Visit | Attending: Radiation Oncology | Admitting: Radiation Oncology

## 2024-06-23 ENCOUNTER — Inpatient Hospital Stay: Admitting: Gynecologic Oncology

## 2024-06-23 ENCOUNTER — Other Ambulatory Visit: Payer: Self-pay

## 2024-06-23 ENCOUNTER — Encounter: Payer: Self-pay | Admitting: Gynecologic Oncology

## 2024-06-23 DIAGNOSIS — C52 Malignant neoplasm of vagina: Secondary | ICD-10-CM | POA: Diagnosis not present

## 2024-06-23 DIAGNOSIS — Z510 Encounter for antineoplastic radiation therapy: Secondary | ICD-10-CM | POA: Diagnosis not present

## 2024-06-23 LAB — RAD ONC ARIA SESSION SUMMARY
Course Elapsed Days: 29
Plan Fractions Treated to Date: 18
Plan Prescribed Dose Per Fraction: 1.8 Gy
Plan Total Fractions Prescribed: 25
Plan Total Prescribed Dose: 45 Gy
Reference Point Dosage Given to Date: 32.4 Gy
Reference Point Session Dosage Given: 1.8 Gy
Session Number: 18

## 2024-06-23 NOTE — Progress Notes (Signed)
 Gynecologic Oncology Telehealth Note: Gyn-Onc  I connected with [name] on 06/23/24 at  6:00 PM EDT by telephone and verified that I am speaking with the correct person using two identifiers.  I discussed the limitations, risks, security and privacy concerns of performing an evaluation and management service by telemedicine and the availability of in-person appointments. I also discussed with the patient that there may be a patient responsible charge related to this service. The patient expressed understanding and agreed to proceed.  Other persons participating in the visit and their role in the encounter: none.  Patient's location: home, [place] Provider's location: [facility], [place]  Reason for Visit: treatment planning  Treatment History: Oncology History Overview Note  CARIS: p53 mutated, MSI stable, low TMB, PD-L1 0%, Her2/neu neg 0 score, BRAF neg   Vaginal cancer (HCC)  03/29/2024 Initial Diagnosis   She presented to see her primary care doctor due to postmenopausal bleeding as well as symptoms of dysuria, frequency, urgency and pelvic pressure associated with urinary incontinence.  She was subsequently seen by gynecologist who performed examination and biopsy which revealed diagnosis of vaginal cancer   04/05/2024 Pathology Results   SURGICAL PATHOLOGY  CASE: [id number]  PATIENT: [name]  Surgical Pathology Report   Clinical History: PMB (cm)   FINAL MICROSCOPIC DIAGNOSIS:   A. VAGINAL CUFF, APEX, BIOPSY:  Invasive moderate to poorly differentiated squamous cell carcinoma, focally keratinizing  Tumor present in all margins    04/20/2024 Imaging   NM PET Image Initial (PI) Whole Body (F-18 FDG) Result Date: 04/26/2024 CLINICAL DATA:  Initial treatment strategy for vaginal cancer. EXAM: NUCLEAR MEDICINE PET WHOLE BODY TECHNIQUE: 9.4 mCi F-18 FDG was injected intravenously. Full-ring PET imaging was performed from the head to foot after the radiotracer. CT data was obtained  and used for attenuation correction and anatomic localization. Fasting blood glucose: 105 mg/dl COMPARISON:  Pelvic MRI 04/20/2024 FINDINGS: Mediastinal blood pool activity: SUV max 3.0 HEAD/NECK: No significant abnormal hypermetabolic activity in this region. Incidental CT findings: none CHEST: No significant abnormal hypermetabolic activity in this region. Incidental CT findings: Aortic and branch vessel atherosclerosis. Moderate cardiomegaly. Mild nonspecific peripheral interstitial accentuation in the lungs, cannot exclude mild interstitial edema. ABDOMEN/PELVIS: Posterior to the urinary bladder along the upper third of the vaginal cuff, a hypermetabolic lesion has maximum SUV of 25.5, compatible with malignancy. A right external iliac node measuring 0.8 cm in short axis on image 184 series 4 has maximum SUV of 6.6, compatible with malignant involvement. Along the anterior margin of an otherwise simple right mid to lower kidney cyst, a 1.2 by 2.3 cm higher density component is present on image 137 of series 4. This could simply reflect blood products of a complex cyst, but I can confidently exclude low-grade metabolic activity. Dedicated renal protocol MRI with and without contrast is recommended for definitive characterization. Incidental CT findings: Hiatal hernia with adjacent postoperative findings under is metal clips in the left upper quadrant. Atherosclerosis is present, including aortoiliac atherosclerotic disease. Right inguinal hernia contains adipose tissue and a loop of small bowel without findings of strangulation or obstruction. SKELETON: No significant abnormal hypermetabolic activity in this region. Incidental CT findings: none EXTREMITIES: No significant abnormal hypermetabolic activity in this region. Incidental CT findings: Incidental fatty components of the medial head gastrocnemius muscle on the right. IMPRESSION: 1. Hypermetabolic lesion along the upper third of the vaginal cuff posterior  to the urinary bladder, compatible with malignancy. 2. A 0.8 cm right external iliac node has  maximum SUV of 6.6, compatible with malignant involvement. 3. Along the anterior margin of an otherwise simple right mid to lower kidney cyst, a 1.2 by 2.3 cm higher density component is present. This could simply reflect blood products of a complex cyst, but I can confidently exclude low-grade metabolic activity. Dedicated renal protocol MRI with and without contrast is recommended for definitive characterization, to exclude synchronous renal mass. 4. Right inguinal hernia contains adipose tissue and a loop of small bowel without findings of strangulation or obstruction. 5. Hiatal hernia with adjacent postoperative findings under is metal clips in the left upper quadrant. 6. Moderate cardiomegaly. Mild nonspecific peripheral interstitial accentuation in the lungs, cannot exclude mild interstitial edema. 7.  Aortic Atherosclerosis (ICD10-I70.0). Electronically Signed   By: Ryan Salvage M.D.   On: 04/26/2024 21:03   MR PELVIS W WO CONTRAST Result Date: 04/26/2024 CLINICAL DATA:  Vaginal carcinoma. EXAM: MRI PELVIS WITHOUT AND WITH CONTRAST TECHNIQUE: Multiplanar multisequence MR imaging of the pelvis was performed both before and after administration of intravenous contrast. CONTRAST:  9mL GADAVIST  GADOBUTROL  1 MMOL/ML IV SOLN COMPARISON:  None Available. FINDINGS: Lower Urinary Tract: Mild irregular wall thickening is seen involving the bladder base, which is contiguous with the vaginal soft tissue mass described below, suspicious for direct bladder invasion. Bowel: Unremarkable pelvic bowel loops. Vascular/Lymphatic: 9 mm right external iliac lymph node is seen on image 16/3, which is borderline enlarged. No pathologically enlarged lymph nodes identified. Reproductive: -- Uterus: Prior hysterectomy. Concentric masslike wall thickening is seen involving the upper portion of the vaginal cuff, measuring approximately  3.8 x 2.6 x 3.5 cm. Irregular contour is seen with the adjacent parametrial fat bilaterally, consistent with parametrial invasion. No evidence of distal ureteral involvement or hydroureteronephrosis. -- Right ovary: Not visualized, however no adnexal mass identified. -- Left ovary:  Not visualized, however no adnexal mass identified. Other: No peritoneal thickening or abnormal free fluid. Moderate right inguinal hernia is seen which contains small bowel. No evidence of bowel obstruction or strangulation. Musculoskeletal:  Unremarkable. IMPRESSION: Concentric mass involving the upper portion of the vaginal cuff, with evidence of bilateral parametrial invasion. Mild irregular bladder wall thickening involving the bladder base, which is contiguous with the vaginal soft tissue mass, highly suspicious for direct bladder invasion. Borderline enlarged 9 mm right external iliac lymph node. Consider PET-CT scan for further evaluation. Moderate right inguinal hernia containing small bowel. No evidence of bowel obstruction or strangulation. Electronically Signed   By: Norleen DELENA Kil M.D.   On: 04/26/2024 16:08      04/28/2024 Initial Diagnosis   Vaginal cancer (HCC)   04/28/2024 Cancer Staging   Staging form: Vagina, AJCC 8th Edition - Clinical stage from 04/28/2024: FIGO Stage IVA (cT4, cN1, cM0) - Signed by Lonn Hicks, MD on 04/28/2024 Stage prefix: Initial diagnosis   05/19/2024 -  Chemotherapy   Patient is on Treatment Plan : Vaginal cancer Carboplatin  (AUC 2) q7d + XRT     05/24/2024 Procedure   Placement of a subcutaneous power-injectable port device. Catheter tip at the superior cavoatrial junction.     Interval History: Doing well. One episode of emesis earlier this week. Bleeding very little, will go 3-4 days with no bleeding and then will have a little bright red bleeding. Denies pain. Feels fatigued. Having some diarrhea intermittently, worse in the last 4-5 days. Imodium helps.  Past  Medical/Surgical History: Past Medical History:  Diagnosis Date   Arthritis    CHF (congestive heart failure) (HCC)  Chronic Leukemia    Depression    Dysrhythmia    A.fib   on Eliquis    H/O hiatal hernia    Hernia    Hernia    right inguinal hernia   Hypertension    Hypothyroidism    Leg swelling    Osteitis pubis (HCC) 05/30/2001   Osteopenia 03/30/2009   Osteopenia 07/30/2022   Bone density 2021 showed elevated FRAX patient was started on Fosamax    Pneumonia    Thyroid  disease     Past Surgical History:  Procedure Laterality Date   ABDOMINAL HYSTERECTOMY  03/1970   APPENDECTOMY  03/1966   BILATERAL SALPINGOOPHORECTOMY     CARDIOVERSION N/A 03/10/2022   Procedure: CARDIOVERSION;  Surgeon: Francyne Headland, MD;  Location: [facility];  Service: Cardiovascular;  Laterality: N/A;   CATARACT EXTRACTION W/PHACO Right 09/25/2014   Procedure: CATARACT EXTRACTION PHACO AND INTRAOCULAR LENS PLACEMENT (IOC);  Surgeon: Oneil T. Roz, MD;  Location: [facility];  Service: Ophthalmology;  Laterality: Right;  CDE:7.56   CATARACT EXTRACTION W/PHACO Left 10/09/2014   Procedure: CATARACT EXTRACTION PHACO AND INTRAOCULAR LENS PLACEMENT LEFT EYE CDE=6.51;  Surgeon: Oneil T. Roz, MD;  Location: [facility];  Service: Ophthalmology;  Laterality: Left;   CHOLECYSTECTOMY  1980   COLONOSCOPY  1999   COLONOSCOPY  March 2007   Repeat 10 years   COLONOSCOPY  11/06/2003    Internal and external hemorrhoids/Two tiny rectal distal sigmoid polyps cold biopsied/Otherwise within normal limits to the cecum   COLONOSCOPY N/A 08/29/2014   Dr. Shaaron: Sessile polyp was found at the cecum status post cold snare polypectomy, tubular adenoma. No further surveillance unless clinical status changes   CYSTOSCOPY N/A 05/24/2024   Procedure: CYSTOSCOPY;  Surgeon: Viktoria Comer SAUNDERS, MD;  Location: [facility];  Service: Gynecology;  Laterality: N/A;   ESOPHAGOGASTRODUODENOSCOPY  2007   Dr. Shaaron: normal esophagus, small tom  oderate sized hiatal hernia, Cameron lesions   EXAM UNDER ANESTHESIA, PELVIC N/A 05/24/2024   Procedure: EXAM UNDER ANESTHESIA, PELVIC;  Surgeon: Viktoria Comer SAUNDERS, MD;  Location: [facility];  Service: Gynecology;  Laterality: N/A;   IR IMAGING GUIDED PORT INSERTION  05/23/2024   SKIN CANCER EXCISION Left 11 1 2016   left arm squamous cancer   TUMOR REMOVAL  03/1968   ovarian    Family History  Problem Relation Age of Onset   Dementia Mother    Heart disease Father    Cancer Father        throat   Cancer Sister        breast   Breast cancer Sister    Lung cancer Grandson    Colon cancer Neg Hx    Lung disease Neg Hx    Autoimmune disease Neg Hx     Social History   Socioeconomic History   Marital status: Widowed    Spouse name: Not on file   Number of children: 1   Years of education: 41   Highest education level: Not on file  Occupational History   Occupation: Retired    Associate Professor: RETIRED    Comment: Ship broker, 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   Widowed since 2021-08-20. Husband W.E. Mareno, died from Kidney Cancer.   Son-Tommy lives nearby.   2 grandson's deceased, Clint and Penne. Penne passed 02/2022 from Brain Cancer.    Social Drivers of Corporate investment banker Strain: Low Risk  (04/05/2024)   Overall Financial Resource Strain (CARDIA)    Difficulty of Paying Living Expenses: Not hard at all  Food Insecurity: No Food Insecurity (04/05/2024)   Hunger Vital Sign    Worried About Running Out of Food in the Last Year: Never true    Ran Out of  Food in the Last Year: Never true  Transportation Needs: No Transportation Needs (04/05/2024)   PRAPARE - Administrator, Civil Service (Medical): No    Lack of Transportation (Non-Medical): No  Physical Activity: Insufficiently Active (04/05/2024)   Exercise Vital Sign    Days of Exercise per Week: 2 days    Minutes of Exercise per Session: 10 min  Stress: No Stress Concern Present (04/05/2024)   [employer] of Occupational Health - Occupational Stress Questionnaire    Feeling of Stress : Only a little  Social Connections: Moderately Integrated (04/05/2024)   Social Connection and Isolation Panel    Frequency of Communication with Friends and Family: More than three times a week    Frequency of Social Gatherings with Friends and Family: Twice a week    Attends Religious Services: More than 4 times per year    Active Member of [employer] or Organizations: Yes    Attends Banker Meetings: More than 4 times per year    Marital Status: Widowed    Current Medications:  Current Outpatient Medications:    acetaminophen  (TYLENOL ) 650 MG CR tablet, Take 650 mg by mouth every 8 (eight) hours as needed for pain., Disp: , Rfl:    albuterol  (VENTOLIN  HFA) 108 (90 Base) MCG/ACT inhaler, INHALE 2 PUFFS INTO THE LUNGS EVERY 6 HOURS AS NEEDED FOR WHEEZING OR SHORTNESS OF BREATH, Disp: 18 g, Rfl: 1   alendronate  (FOSAMAX ) 70 MG tablet, TAKE 1 TABLET(70 MG) BY MOUTH EVERY 7 DAYS WITH A FULL GLASS OF WATER  AND ON AN EMPTY STOMACH, Disp: 4 tablet, Rfl: 3   ALPRAZolam  (XANAX ) 0.5 MG tablet, TAKE 1 TABLET BY MOUTH EVERY NIGHT AT BEDTIME. MAY TAKE 1/2 TABLET DURING THE DAY AS NEEDED, Disp: 45 tablet, Rfl: 4   Cholecalciferol (VITAMIN D3) 2000 UNITS TABS, Take 2,000 Units by mouth daily., Disp: , Rfl:    ELIQUIS  5 MG TABS tablet, TAKE 1 TABLET(5 MG) BY MOUTH TWICE DAILY. FOLLOW-UP, Disp: 180 tablet, Rfl: 1   indapamide  (LOZOL ) 1.25 MG tablet, Take 1 tablet (1.25 mg total) by mouth every  morning. (Patient not taking: Reported on 06/12/2024), Disp: 90 tablet, Rfl: 0   levothyroxine  (SYNTHROID ) 125 MCG tablet, TAKE 1 TABLET BY MOUTH DAILY, Disp: 90 tablet, Rfl: 1   lidocaine -prilocaine  (EMLA ) cream, Apply to affected area once, Disp: 30 g, Rfl: 3   magnesium gluconate (MAGONATE) 500 (27 Mg) MG TABS tablet, Take 500 mg by mouth daily at 6 (six) AM., Disp: , Rfl:    metoprolol  succinate (TOPROL -XL) 25 MG 24 hr tablet, TAKE 1 TABLET BY MOUTH DAILY, Disp: 90 tablet, Rfl: 1   Multiple Vitamin (MULTI-VITAMIN DAILY PO), Take 1 tablet by mouth daily., Disp: , Rfl:  Nintedanib (OFEV ) 100 MG CAPS, Take 1 capsule (100 mg total) by mouth 2 (two) times daily. **dose change**, Disp: 180 capsule, Rfl: 1   ondansetron  (ZOFRAN ) 8 MG tablet, Take 1 tablet (8 mg total) by mouth every 8 (eight) hours as needed for nausea or vomiting. Start on the third day after chemotherapy., Disp: 30 tablet, Rfl: 1   ondansetron  (ZOFRAN -ODT) 4 MG disintegrating tablet, Take 1 tablet (4 mg total) by mouth every 8 (eight) hours as needed., Disp: 20 tablet, Rfl: 0   pantoprazole  (PROTONIX ) 40 MG tablet, TAKE 1 TABLET(40 MG) BY MOUTH DAILY, Disp: 90 tablet, Rfl: 1   potassium chloride  (KLOR-CON  M) 10 MEQ tablet, TAKE 1 TABLET(10 MEQ) BY MOUTH DAILY, Disp: 90 tablet, Rfl: 1   prochlorperazine  (COMPAZINE ) 10 MG tablet, Take 1 tablet (10 mg total) by mouth every 6 (six) hours as needed for nausea or vomiting., Disp: 30 tablet, Rfl: 1   sertraline  (ZOLOFT ) 100 MG tablet, TAKE 1 TABLET BY MOUTH EVERY DAY, Disp: 90 tablet, Rfl: 1  Review of Symptoms: Pertinent positives as per HPI.  Physical Exam: Deferred given limitations of phone visit.  Laboratory & Radiologic Studies: None new  Assessment & Plan: ANWITA MENCER is a 88 y.o. 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   Patient is overall doing well, currently receiving IMRT.  She met with Dr. Jacklin about plan for interstitial implant.  She is concerned  about having to lay flat for 3 days.  We discussed her concerns, specifically addressing some of the ways that we help when patients are admitted for multiple days with an interstitial implant.    We discussed plan for MRI 1-2 weeks after she finishes IMRT.  I encouraged at least having a conversation with Dr. Jacklin after those images are reviewed.  Depending on response of her tumor, Dr. Jacklin will make a final recommendation on intracavitary vs interstitial implant.   I have asked Darice to reach out to the patient early next week to confirm when and where she will be getting her MRI.   I discussed the assessment and treatment plan with the patient. The patient was provided with an opportunity to ask questions and all were answered. The patient agreed with the plan and demonstrated an understanding of the instructions.   The patient was advised to call back or see an in-person evaluation if the symptoms worsen or if the condition fails to improve as anticipated.   12 minutes of total time was spent for this patient encounter, including preparation, phone counseling with the patient and coordination of care, and documentation of the encounter.   Comer Dollar, MD  Division of Gynecologic Oncology  Department of Obstetrics and Gynecology  [facility]

## 2024-06-26 ENCOUNTER — Ambulatory Visit
Admission: RE | Admit: 2024-06-26 | Discharge: 2024-06-26 | Disposition: A | Discharge status: Home / Self Care | Source: Ambulatory Visit | Attending: Radiation Oncology | Admitting: Radiation Oncology

## 2024-06-26 ENCOUNTER — Other Ambulatory Visit: Payer: Self-pay

## 2024-06-26 DIAGNOSIS — Z510 Encounter for antineoplastic radiation therapy: Secondary | ICD-10-CM | POA: Diagnosis not present

## 2024-06-26 DIAGNOSIS — C52 Malignant neoplasm of vagina: Secondary | ICD-10-CM | POA: Diagnosis not present

## 2024-06-26 LAB — RAD ONC ARIA SESSION SUMMARY
Course Elapsed Days: 32
Plan Fractions Treated to Date: 19
Plan Prescribed Dose Per Fraction: 1.8 Gy
Plan Total Fractions Prescribed: 25
Plan Total Prescribed Dose: 45 Gy
Reference Point Dosage Given to Date: 34.2 Gy
Reference Point Session Dosage Given: 1.8 Gy
Session Number: 19

## 2024-06-26 NOTE — Telephone Encounter (Signed)
 Attempt to schedule Pre-Procedure Appointment.  left message requesting call back to [phone]

## 2024-06-27 ENCOUNTER — Other Ambulatory Visit: Payer: Self-pay

## 2024-06-27 ENCOUNTER — Ambulatory Visit
Admission: RE | Admit: 2024-06-27 | Discharge: 2024-06-27 | Disposition: A | Discharge status: Home / Self Care | Source: Ambulatory Visit | Attending: Radiation Oncology | Admitting: Radiation Oncology

## 2024-06-27 ENCOUNTER — Telehealth: Payer: Self-pay | Admitting: Oncology

## 2024-06-27 DIAGNOSIS — C52 Malignant neoplasm of vagina: Secondary | ICD-10-CM | POA: Diagnosis not present

## 2024-06-27 DIAGNOSIS — Z510 Encounter for antineoplastic radiation therapy: Secondary | ICD-10-CM | POA: Diagnosis not present

## 2024-06-27 LAB — RAD ONC ARIA SESSION SUMMARY
Course Elapsed Days: 33
Plan Fractions Treated to Date: 20
Plan Prescribed Dose Per Fraction: 1.8 Gy
Plan Total Fractions Prescribed: 25
Plan Total Prescribed Dose: 45 Gy
Reference Point Dosage Given to Date: 36 Gy
Reference Point Session Dosage Given: 1.8 Gy
Session Number: 20

## 2024-06-27 NOTE — Telephone Encounter (Signed)
 Called [name] and let her know that Dr. Jacklin would like her to have the MRI at [facility] on 07/10/24 and then will see her to review the results on 07/18/2024 at [facility].  Prarthana verbalized understanding and agreement of the plan.

## 2024-06-27 NOTE — Telephone Encounter (Signed)
 Attempt to schedule Pre-Procedure Appointment.  unable to leave message - invalid number

## 2024-06-27 NOTE — Telephone Encounter (Signed)
Attempt to schedule Pre-Procedure Appointment-unable to leave message.

## 2024-06-28 ENCOUNTER — Ambulatory Visit
Admission: RE | Admit: 2024-06-28 | Discharge: 2024-06-28 | Disposition: A | Discharge status: Home / Self Care | Source: Ambulatory Visit | Attending: Radiation Oncology | Admitting: Radiation Oncology

## 2024-06-28 ENCOUNTER — Other Ambulatory Visit: Payer: Self-pay

## 2024-06-28 DIAGNOSIS — Z510 Encounter for antineoplastic radiation therapy: Secondary | ICD-10-CM | POA: Diagnosis not present

## 2024-06-28 DIAGNOSIS — C52 Malignant neoplasm of vagina: Secondary | ICD-10-CM | POA: Diagnosis not present

## 2024-06-28 LAB — RAD ONC ARIA SESSION SUMMARY
Course Elapsed Days: 34
Plan Fractions Treated to Date: 21
Plan Prescribed Dose Per Fraction: 1.8 Gy
Plan Total Fractions Prescribed: 25
Plan Total Prescribed Dose: 45 Gy
Reference Point Dosage Given to Date: 37.8 Gy
Reference Point Session Dosage Given: 1.8 Gy
Session Number: 21

## 2024-06-29 ENCOUNTER — Ambulatory Visit
Admission: RE | Admit: 2024-06-29 | Discharge: 2024-06-29 | Disposition: A | Discharge status: Home / Self Care | Source: Ambulatory Visit | Attending: Radiation Oncology | Admitting: Radiation Oncology

## 2024-06-29 ENCOUNTER — Ambulatory Visit

## 2024-06-29 ENCOUNTER — Other Ambulatory Visit: Payer: Self-pay

## 2024-06-29 DIAGNOSIS — C52 Malignant neoplasm of vagina: Secondary | ICD-10-CM | POA: Diagnosis not present

## 2024-06-29 DIAGNOSIS — Z510 Encounter for antineoplastic radiation therapy: Secondary | ICD-10-CM | POA: Diagnosis not present

## 2024-06-29 LAB — RAD ONC ARIA SESSION SUMMARY
Course Elapsed Days: 35
Plan Fractions Treated to Date: 22
Plan Prescribed Dose Per Fraction: 1.8 Gy
Plan Total Fractions Prescribed: 25
Plan Total Prescribed Dose: 45 Gy
Reference Point Dosage Given to Date: 39.6 Gy
Reference Point Session Dosage Given: 1.8 Gy
Session Number: 22

## 2024-06-29 NOTE — Telephone Encounter (Signed)
 Telephone call to patient to schedule PPS appointment.  Patient in agreement with:  Date: 07/18/2024 Time: 4pm Location: [address] [place], [place] [address]

## 2024-06-30 ENCOUNTER — Ambulatory Visit

## 2024-06-30 ENCOUNTER — Inpatient Hospital Stay

## 2024-06-30 ENCOUNTER — Inpatient Hospital Stay (HOSPITAL_BASED_OUTPATIENT_CLINIC_OR_DEPARTMENT_OTHER): Admitting: Hematology and Oncology

## 2024-06-30 ENCOUNTER — Ambulatory Visit (HOSPITAL_COMMUNITY)

## 2024-06-30 ENCOUNTER — Ambulatory Visit
Admission: RE | Admit: 2024-06-30 | Discharge: 2024-06-30 | Disposition: A | Discharge status: Home / Self Care | Source: Ambulatory Visit | Attending: Radiation Oncology | Admitting: Radiation Oncology

## 2024-06-30 ENCOUNTER — Encounter: Payer: Self-pay | Admitting: Hematology and Oncology

## 2024-06-30 VITALS — BP 129/74 | HR 73 | Resp 18 | Ht 65.0 in | Wt 194.4 lb

## 2024-06-30 DIAGNOSIS — C52 Malignant neoplasm of vagina: Secondary | ICD-10-CM | POA: Insufficient documentation

## 2024-06-30 DIAGNOSIS — Z5111 Encounter for antineoplastic chemotherapy: Secondary | ICD-10-CM | POA: Insufficient documentation

## 2024-06-30 DIAGNOSIS — R39198 Other difficulties with micturition: Secondary | ICD-10-CM | POA: Insufficient documentation

## 2024-06-30 DIAGNOSIS — D61818 Other pancytopenia: Secondary | ICD-10-CM

## 2024-06-30 DIAGNOSIS — Z510 Encounter for antineoplastic radiation therapy: Secondary | ICD-10-CM | POA: Insufficient documentation

## 2024-06-30 LAB — CBC WITH DIFFERENTIAL (CANCER CENTER ONLY)
Abs Immature Granulocytes: 0.03 K/uL (ref 0.00–0.07)
Basophils Absolute: 0 K/uL (ref 0.0–0.1)
Basophils Relative: 0 %
Eosinophils Absolute: 0 K/uL (ref 0.0–0.5)
Eosinophils Relative: 1 %
HCT: 27.4 % — ABNORMAL LOW (ref 36.0–46.0)
Hemoglobin: 8.9 g/dL — ABNORMAL LOW (ref 12.0–15.0)
Immature Granulocytes: 1 %
Lymphocytes Relative: 25 %
Lymphs Abs: 0.9 K/uL (ref 0.7–4.0)
MCH: 31.7 pg (ref 26.0–34.0)
MCHC: 32.5 g/dL (ref 30.0–36.0)
MCV: 97.5 fL (ref 80.0–100.0)
Monocytes Absolute: 0.5 K/uL (ref 0.1–1.0)
Monocytes Relative: 14 %
Neutro Abs: 2.2 K/uL (ref 1.7–7.7)
Neutrophils Relative %: 59 %
Platelet Count: 341 K/uL (ref 150–400)
RBC: 2.81 MIL/uL — ABNORMAL LOW (ref 3.87–5.11)
RDW: 17.3 % — ABNORMAL HIGH (ref 11.5–15.5)
WBC Count: 3.7 K/uL — ABNORMAL LOW (ref 4.0–10.5)
nRBC: 0 % (ref 0.0–0.2)

## 2024-06-30 LAB — BASIC METABOLIC PANEL - CANCER CENTER ONLY
Anion gap: 4 — ABNORMAL LOW (ref 5–15)
BUN: 9 mg/dL (ref 8–23)
CO2: 30 mmol/L (ref 22–32)
Calcium: 8.5 mg/dL — ABNORMAL LOW (ref 8.9–10.3)
Chloride: 102 mmol/L (ref 98–111)
Creatinine: 0.83 mg/dL (ref 0.44–1.00)
GFR, Estimated: >60 mL/min (ref >60)
Glucose, Bld: 88 mg/dL (ref 70–99)
Potassium: 4.2 mmol/L (ref 3.5–5.1)
Sodium: 136 mmol/L (ref 135–145)

## 2024-06-30 MED ORDER — SODIUM CHLORIDE 0.9 % IV SOLN: INTRAVENOUS | Status: DC

## 2024-06-30 MED ORDER — DEXAMETHASONE SODIUM PHOSPHATE 10 MG/ML IJ SOLN
10.0000 mg | Freq: Once | INTRAMUSCULAR | Status: AC
  Administered 2024-06-30: 10 mg via INTRAVENOUS
  Filled 2024-06-30: qty 1

## 2024-06-30 MED ORDER — SODIUM CHLORIDE 0.9 % IV SOLN
160.8000 mg | Freq: Once | INTRAVENOUS | Status: AC
  Administered 2024-06-30: 160 mg via INTRAVENOUS
  Filled 2024-06-30: qty 16

## 2024-06-30 MED ORDER — SODIUM CHLORIDE 0.9% FLUSH
10.0000 mL | Freq: Once | INTRAVENOUS | Status: AC
Start: 2024-06-30 — End: 2024-06-30
  Administered 2024-06-30: 10 mL

## 2024-06-30 MED ORDER — PALONOSETRON HCL INJECTION 0.25 MG/5ML
0.2500 mg | Freq: Once | INTRAVENOUS | Status: AC
  Administered 2024-06-30: 0.25 mg via INTRAVENOUS
  Filled 2024-06-30: qty 5

## 2024-06-30 MED ORDER — SODIUM CHLORIDE 0.9% FLUSH
10.0000 mL | INTRAVENOUS | Status: DC | PRN
  Administered 2024-06-30: 10 mL

## 2024-06-30 NOTE — Patient Instructions (Signed)
 [facility] - A DEPT OF [facility]  Discharge Instructions: Thank you for choosing [facility] to provide your oncology and hematology care.   If you have a lab appointment with the Cancer Center, please go directly to the Cancer Center and check in at the registration area.   Wear comfortable clothing and clothing appropriate for easy access to any Portacath or PICC line.   We strive to give you quality time with your provider. You may need to reschedule your appointment if you arrive late (15 or more minutes).  Arriving late affects you and other patients whose appointments are after yours.  Also, if you miss three or more appointments without notifying the office, you may be dismissed from the clinic at the provider's discretion.      For prescription refill requests, have your pharmacy contact our office and allow 72 hours for refills to be completed.    Today you received the following chemotherapy and/or immunotherapy agent: Carboplatin    To help prevent nausea and vomiting after your treatment, we encourage you to take your nausea medication as directed.  BELOW ARE SYMPTOMS THAT SHOULD BE REPORTED IMMEDIATELY: *FEVER GREATER THAN 100.4 F (38 C) OR HIGHER *CHILLS OR SWEATING *NAUSEA AND VOMITING THAT IS NOT CONTROLLED WITH YOUR NAUSEA MEDICATION *UNUSUAL SHORTNESS OF BREATH *UNUSUAL BRUISING OR BLEEDING *URINARY PROBLEMS (pain or burning when urinating, or frequent urination) *BOWEL PROBLEMS (unusual diarrhea, constipation, pain near the anus) TENDERNESS IN MOUTH AND THROAT WITH OR WITHOUT PRESENCE OF ULCERS (sore throat, sores in mouth, or a toothache) UNUSUAL RASH, SWELLING OR PAIN  UNUSUAL VAGINAL DISCHARGE OR ITCHING   Items with * indicate a potential emergency and should be followed up as soon as possible or go to the Emergency Department if any problems should occur.  Please show the CHEMOTHERAPY ALERT CARD or IMMUNOTHERAPY  ALERT CARD at check-in to the Emergency Department and triage nurse.  Should you have questions after your visit or need to cancel or reschedule your appointment, please contact [facility] - A DEPT OF [facility]  Dept: [phone]  and follow the prompts.  Office hours are 8:00 a.m. to 4:30 p.m. Monday - Friday. Please note that voicemails left after 4:00 p.m. may not be returned until the following business day.  We are closed weekends and major holidays. You have access to a nurse at all times for urgent questions. Please call the main number to the clinic Dept: [phone] and follow the prompts.   For any non-urgent questions, you may also contact your provider using MyChart. We now offer e-Visits for anyone 35 and older to request care online for non-urgent symptoms. For details visit [email or web address].   Also download the MyChart app! Go to the app store, search MyChart, open the app, select [facility], and log in with your MyChart username and password.  Carboplatin  Injection What is this medication? CARBOPLATIN  (KAR boe pla tin) treats some types of cancer. It works by slowing down the growth of cancer cells. This medicine may be used for other purposes; ask your health care provider or pharmacist if you have questions. COMMON BRAND NAME(S): Paraplatin  What should I tell my care team before I take this medication? They need to know if you have any of these conditions: Blood disorders Hearing problems Kidney disease Recent or ongoing radiation therapy An unusual or allergic reaction to carboplatin , cisplatin, other medications, foods, dyes,  or preservatives Pregnant or trying to get pregnant Breast-feeding How should I use this medication? This medication is injected into a vein. It is given by your care team in a hospital or clinic setting. Talk to your care team about the use of this medication in children. Special care may be  needed. Overdosage: If you think you have taken too much of this medicine contact a poison control center or emergency room at once. NOTE: This medicine is only for you. Do not share this medicine with others. What if I miss a dose? Keep appointments for follow-up doses. It is important not to miss your dose. Call your care team if you are unable to keep an appointment. What may interact with this medication? Medications for seizures Some antibiotics, such as amikacin, gentamicin, neomycin, streptomycin, tobramycin Vaccines This list may not describe all possible interactions. Give your health care provider a list of all the medicines, herbs, non-prescription drugs, or dietary supplements you use. Also tell them if you smoke, drink alcohol, or use illegal drugs. Some items may interact with your medicine. What should I watch for while using this medication? Your condition will be monitored carefully while you are receiving this medication. You may need blood work while taking this medication. This medication may make you feel generally unwell. This is not uncommon, as chemotherapy can affect healthy cells as well as cancer cells. Report any side effects. Continue your course of treatment even though you feel ill unless your care team tells you to stop. In some cases, you may be given additional medications to help with side effects. Follow all directions for their use. This medication may increase your risk of getting an infection. Call your care team for advice if you get a fever, chills, sore throat, or other symptoms of a cold or flu. Do not treat yourself. Try to avoid being around people who are sick. Avoid taking medications that contain aspirin, acetaminophen , ibuprofen, naproxen, or ketoprofen unless instructed by your care team. These medications may hide a fever. Be careful brushing or flossing your teeth or using a toothpick because you may get an infection or bleed more easily. If you  have any dental work done, tell your dentist you are receiving this medication. Talk to your care team if you wish to become pregnant or think you might be pregnant. This medication can cause serious birth defects. Talk to your care team about effective forms of contraception. Do not breast-feed while taking this medication. What side effects may I notice from receiving this medication? Side effects that you should report to your care team as soon as possible: Allergic reactions--skin rash, itching, hives, swelling of the face, lips, tongue, or throat Infection--fever, chills, cough, sore throat, wounds that don't heal, pain or trouble when passing urine, general feeling of discomfort or being unwell Low red blood cell level--unusual weakness or fatigue, dizziness, headache, trouble breathing Pain, tingling, or numbness in the hands or feet, muscle weakness, change in vision, confusion or trouble speaking, loss of balance or coordination, trouble walking, seizures Unusual bruising or bleeding Side effects that usually do not require medical attention (report to your care team if they continue or are bothersome): Hair loss Nausea Unusual weakness or fatigue Vomiting This list may not describe all possible side effects. Call your doctor for medical advice about side effects. You may report side effects to FDA at 1-800-FDA-1088. Where should I keep my medication? This medication is given in a hospital or clinic. It will  not be stored at home. NOTE: This sheet is a summary. It may not cover all possible information. If you have questions about this medicine, talk to your doctor, pharmacist, or health care provider.  2024 Elsevier/Gold Standard (2022-03-10 00:00:00)

## 2024-06-30 NOTE — Assessment & Plan Note (Addendum)
 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, CARIS: P53 mutation, BRAF negative, MSI stable, low tumor mutation burden 17mut/Mb, HER2/neu score 0, negative, PD-L1 0%  Her treatment course is complicated by anemia, mild fluid retention and mild vaginal bleeding Treatment cycle #4 was delayed until today due to recent severe anemia Review of her labs show persistent anemia and mild leukopenia but she feels good and felt ready to resume chemotherapy We will resume treatment today which will be her last treatment She was seen by [facility] radiation oncologist and was recommended potential treatment with interstitial brachytherapy Her granddaughter expressed concern that she may not be able to tolerated I give the patient some of my perspective and recommend the patient to consider the risk and benefits of pursuing additional treatment after completion of external beam radiation therapy next week I will see her in 2 months for port flush and blood work We discussed plan for repeat imaging study approximately 3 months after completion of radiation treatment

## 2024-06-30 NOTE — Assessment & Plan Note (Addendum)
 Due to recent treatment She is not symptomatic Monitor closely

## 2024-06-30 NOTE — Progress Notes (Signed)
 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, CARIS: P53 mutation, BRAF negative, MSI stable, low tumor mutation burden 43mut/Mb, HER2/neu score 0, negative, PD-L1 0%  Her treatment course is complicated by anemia, mild fluid retention and mild vaginal bleeding Treatment cycle #4 was delayed until today due to recent severe anemia Review of her labs show persistent anemia and mild leukopenia but she feels good and felt ready to resume chemotherapy We will resume treatment today which will be her last treatment She was seen by [facility] radiation oncologist and was recommended potential treatment with interstitial brachytherapy Her granddaughter expressed concern that she may not be able to tolerated I give the patient some of my perspective and recommend the patient to consider the risk and benefits of pursuing additional treatment after completion of external beam radiation therapy next week I will see her in 2 months for port flush and blood work We discussed plan for repeat imaging study approximately 3 months after completion of radiation treatment Pancytopenia, acquired (HCC) Due to recent treatment She is not symptomatic Monitor closely  Orders Placed This Encounter  Procedures   CBC with Differential/Platelet    Standing Status:   Standing    Number of Occurrences:   22    Expiration Date:   06/30/2025   Comprehensive metabolic panel with GFR    Standing Status:   Standing    Number of Occurrences:   33    Expiration Date:    06/30/2025   Magnesium    Standing Status:   Future    Expiration Date:   06/30/2025     Almarie Bedford, MD  INTERVAL HISTORY: she returns for treatment follow-up Complications related to previous cycle of chemotherapy included pancytopenia, She returns with her granddaughter She felt well since last time I saw her Denies recent vaginal bleeding Her granddaughter expresses concern that the patient might not be able to tolerate the treatment as she was told she might be bedbound for 3 days after the procedure  PHYSICAL EXAMINATION: ECOG PERFORMANCE STATUS: 2 - Symptomatic, <50% confined to bed  No results found for: CAN125    Latest Ref Rng & Units 06/30/2024   11:18 AM 06/16/2024   10:28 AM 06/09/2024   10:34 AM  CBC  WBC 4.0 - 10.5 K/uL 3.7  4.3  4.8   Hemoglobin 12.0 - 15.0 g/dL 8.9  8.8  8.8   Hematocrit 36.0 - 46.0 % 27.4  26.7  26.3   Platelets 150 - 400 K/uL 341  132  155       Chemistry      Component Value Date/Time   NA 136 06/30/2024 1118   NA 138 11/05/2023 0943   K 4.2 06/30/2024 1118   CL 102 06/30/2024 1118   CO2 30 06/30/2024 1118   BUN 9 06/30/2024 1118   BUN 13 11/05/2023 0943   CREATININE 0.83 06/30/2024 1118   CREATININE 0.78 06/12/2014 0950      Component Value Date/Time   CALCIUM 8.5 (L) 06/30/2024 1118   ALKPHOS 61 05/15/2024 1001   AST 27 05/15/2024 1001   AST 22 04/28/2024  1321   ALT 13 05/15/2024 1001   ALT 11 04/28/2024 1321   BILITOT 1.2 05/15/2024 1001   BILITOT 0.5 04/28/2024 1321       Vitals:   06/30/24 1150  BP: 129/74  Pulse: 73  Resp: 18  SpO2: 97%   Filed Weights   06/30/24 1150  Weight: 194 lb 6.4 oz (88.2 kg)   Other relevant data reviewed during this visit included CBC, BMP

## 2024-07-03 ENCOUNTER — Ambulatory Visit

## 2024-07-04 ENCOUNTER — Ambulatory Visit

## 2024-07-04 ENCOUNTER — Ambulatory Visit
Admission: RE | Admit: 2024-07-04 | Discharge: 2024-07-04 | Disposition: A | Discharge status: Home / Self Care | Source: Ambulatory Visit | Attending: Radiation Oncology | Admitting: Radiation Oncology

## 2024-07-04 ENCOUNTER — Other Ambulatory Visit: Payer: Self-pay

## 2024-07-04 DIAGNOSIS — Z510 Encounter for antineoplastic radiation therapy: Secondary | ICD-10-CM | POA: Diagnosis not present

## 2024-07-04 DIAGNOSIS — C52 Malignant neoplasm of vagina: Secondary | ICD-10-CM | POA: Diagnosis not present

## 2024-07-04 LAB — RAD ONC ARIA SESSION SUMMARY
Course Elapsed Days: 40
Plan Fractions Treated to Date: 24
Plan Prescribed Dose Per Fraction: 1.8 Gy
Plan Total Fractions Prescribed: 25
Plan Total Prescribed Dose: 45 Gy
Reference Point Dosage Given to Date: 43.2 Gy
Reference Point Session Dosage Given: 2.4209 Gy
Session Number: 23

## 2024-07-05 ENCOUNTER — Telehealth: Admitting: Gynecologic Oncology

## 2024-07-05 ENCOUNTER — Ambulatory Visit
Admission: RE | Admit: 2024-07-05 | Discharge: 2024-07-05 | Disposition: A | Discharge status: Home / Self Care | Source: Ambulatory Visit | Attending: Radiation Oncology | Admitting: Radiation Oncology

## 2024-07-05 ENCOUNTER — Other Ambulatory Visit: Payer: Self-pay

## 2024-07-05 ENCOUNTER — Ambulatory Visit

## 2024-07-05 DIAGNOSIS — C52 Malignant neoplasm of vagina: Secondary | ICD-10-CM | POA: Diagnosis not present

## 2024-07-05 DIAGNOSIS — Z510 Encounter for antineoplastic radiation therapy: Secondary | ICD-10-CM | POA: Diagnosis not present

## 2024-07-05 LAB — RAD ONC ARIA SESSION SUMMARY
Course Elapsed Days: 41
Plan Fractions Treated to Date: 25
Plan Prescribed Dose Per Fraction: 1.8 Gy
Plan Total Fractions Prescribed: 25
Plan Total Prescribed Dose: 45 Gy
Reference Point Dosage Given to Date: 45 Gy
Reference Point Session Dosage Given: 1.8 Gy
Session Number: 24

## 2024-07-06 ENCOUNTER — Ambulatory Visit

## 2024-07-06 ENCOUNTER — Telehealth: Admitting: Gynecologic Oncology

## 2024-07-06 NOTE — Radiation Completion Notes (Signed)
  Radiation Oncology         [phone] ________________________________  Name: [name] MRN: [id number]  Date of Service: 07/05/2024  DOB: 1936/08/21  End of Treatment Note   Diagnosis: Stage IVA (cT4, cN1, cM0) Vaginal Cancer Intent: Curative     ==========DELIVERED PLANS==========  First Treatment Date: 2024-05-23 Last Treatment Date: 2024-07-05   Plan Name: Pelvis Site: Vagina Technique: IMRT Mode: Photon Dose Per Fraction: 1.8 Gy Prescribed Dose (Delivered / Prescribed): 45 Gy / 45 Gy Prescribed Fxs (Delivered / Prescribed): 25 / 25     ====================================   The patient tolerated radiation. She developed mild fatigue and diarrhea throughout her treatment. She was given Imodium which relieved her diarrhea.   The patient will return in one month and will continue follow up with Dr. Viktoria as well.      Ronita Due, PA-C

## 2024-07-07 ENCOUNTER — Ambulatory Visit

## 2024-07-07 ENCOUNTER — Ambulatory Visit: Payer: PPO

## 2024-07-07 VITALS — Ht 66.0 in | Wt 194.0 lb

## 2024-07-07 DIAGNOSIS — Z0000 Encounter for general adult medical examination without abnormal findings: Secondary | ICD-10-CM

## 2024-07-07 NOTE — Patient Instructions (Signed)
 [name] , Thank you for taking time out of your busy schedule to complete your Annual Wellness Visit with me. I enjoyed our conversation and look forward to speaking with you again next year. I, as well as your care team,  appreciate your ongoing commitment to your health goals. Please review the following plan we discussed and let me know if I can assist you in the future. Your Game plan/ To Do List     Follow up Visits: We will see or speak with you next year for your Next Medicare AWV with our clinical staff Have you seen your provider in the last 6 months (3 months if uncontrolled diabetes)? Yes  Clinician Recommendations:  Aim for 30 minutes of exercise or brisk walking, 6-8 glasses of water , and 5 servings of fruits and vegetables each day.       This is a list of the screenings recommended for you:  Health Maintenance  Topic Date Due   COVID-19 Vaccine (3 - Pfizer risk series) 01/23/2020   Zoster (Shingles) Vaccine (2 of 2) 03/02/2023   Flu Shot  07/31/2024*   DTaP/Tdap/Td vaccine (1 - Tdap) 02/16/2025*   Medicare Annual Wellness Visit  07/07/2025   Pneumococcal Vaccine for age over 77  Completed   DEXA scan (bone density measurement)  Completed   Hepatitis B Vaccine  Aged Out   HPV Vaccine  Aged Out   Meningitis B Vaccine  Aged Out  *Topic was postponed. The date shown is not the original due date.    Advanced directives: (In Chart) A copy of your advanced directives are scanned into your chart should your provider ever need it.  Advance Care Planning is important because it:  [x]  Makes sure you receive the medical care that is consistent with your values, goals, and preferences  [x]  It provides guidance to your family and loved ones and reduces their decisional burden about whether or not they are making the right decisions based on your wishes.  Follow the link provided in your after visit summary or read over the paperwork we have mailed to you to help you started  getting your Advance Directives in place. If you need assistance in completing these, please reach out to us  so that we can help you!  See attachments for Preventive Care and Fall Prevention Tips.

## 2024-07-07 NOTE — Progress Notes (Signed)
 Subjective:   [name] is a 88 y.o. who presents for a Medicare Wellness preventive visit.  As a reminder, Annual Wellness Visits don't include a physical exam, and some assessments may be limited, especially if this visit is performed virtually. We may recommend an in-person follow-up visit with your provider if needed.  Visit Complete: Virtual I connected with  Mardeen KATHEE Pear on 07/07/24 by a audio enabled telemedicine application and verified that I am speaking with the correct person using two identifiers.  Patient Location: Home  Provider Location: Home Office  I discussed the limitations of evaluation and management by telemedicine. The patient expressed understanding and agreed to proceed.  Vital Signs: Because this visit was a virtual/telehealth visit, some criteria may be missing or patient reported. Any vitals not documented were not able to be obtained and vitals that have been documented are patient reported.  VideoDeclined- This patient declined Librarian, academic. Therefore the visit was completed with audio only.  Persons Participating in Visit: Patient.  AWV Questionnaire: No: Patient Medicare AWV questionnaire was not completed prior to this visit.  Cardiac Risk Factors include: advanced age (>28men, >52 women);dyslipidemia;hypertension     Objective:    Today's Vitals   07/07/24 1308  Weight: 194 lb (88 kg)  Height: 5' 6 (1.676 m)   Body mass index is 31.31 kg/m.     07/07/2024    1:27 PM 06/01/2024    8:15 AM 05/24/2024    6:06 AM 05/23/2024    1:44 PM 05/19/2024    1:45 PM 05/15/2024    9:46 AM 05/10/2024    8:45 AM  Advanced Directives  Does Patient Have a Medical Advance Directive? Yes No Yes Yes Yes No Yes  Type of Estate agent of [place];Living will  Healthcare Power of [place];Living will Healthcare Power of [place];Living will Healthcare Power of [place];Living will  Healthcare Power of  [place];Living will  Does patient want to make changes to medical advance directive? No - Patient declined No - Patient declined No - Patient declined No - Patient declined     Copy of Healthcare Power of Attorney in Chart? Yes - validated most recent copy scanned in chart (See row information)  No - copy requested Yes - validated most recent copy scanned in chart (See row information)       Current Medications (verified) Outpatient Encounter Medications as of 07/07/2024  Medication Sig   acetaminophen  (TYLENOL ) 650 MG CR tablet Take 650 mg by mouth every 8 (eight) hours as needed for pain.   albuterol  (VENTOLIN  HFA) 108 (90 Base) MCG/ACT inhaler INHALE 2 PUFFS INTO THE LUNGS EVERY 6 HOURS AS NEEDED FOR WHEEZING OR SHORTNESS OF BREATH   alendronate  (FOSAMAX ) 70 MG tablet TAKE 1 TABLET(70 MG) BY MOUTH EVERY 7 DAYS WITH A FULL GLASS OF WATER  AND ON AN EMPTY STOMACH   ALPRAZolam  (XANAX ) 0.5 MG tablet TAKE 1 TABLET BY MOUTH EVERY NIGHT AT BEDTIME. MAY TAKE 1/2 TABLET DURING THE DAY AS NEEDED   Cholecalciferol (VITAMIN D3) 2000 UNITS TABS Take 2,000 Units by mouth daily.   ELIQUIS  5 MG TABS tablet TAKE 1 TABLET(5 MG) BY MOUTH TWICE DAILY. FOLLOW-UP   indapamide  (LOZOL ) 1.25 MG tablet Take 1 tablet (1.25 mg total) by mouth every morning.   levothyroxine  (SYNTHROID ) 125 MCG tablet TAKE 1 TABLET BY MOUTH DAILY   lidocaine -prilocaine  (EMLA ) cream Apply to affected area once   magnesium gluconate (MAGONATE) 500 (27 Mg) MG TABS tablet  Take 500 mg by mouth daily at 6 (six) AM.   metoprolol  succinate (TOPROL -XL) 25 MG 24 hr tablet TAKE 1 TABLET BY MOUTH DAILY   Multiple Vitamin (MULTI-VITAMIN DAILY PO) Take 1 tablet by mouth daily.   Nintedanib (OFEV ) 100 MG CAPS Take 1 capsule (100 mg total) by mouth 2 (two) times daily. **dose change**   ondansetron  (ZOFRAN ) 8 MG tablet Take 1 tablet (8 mg total) by mouth every 8 (eight) hours as needed for nausea or vomiting. Start on the third day after chemotherapy.    ondansetron  (ZOFRAN -ODT) 4 MG disintegrating tablet Take 1 tablet (4 mg total) by mouth every 8 (eight) hours as needed.   pantoprazole  (PROTONIX ) 40 MG tablet TAKE 1 TABLET(40 MG) BY MOUTH DAILY   potassium chloride  (KLOR-CON  M) 10 MEQ tablet TAKE 1 TABLET(10 MEQ) BY MOUTH DAILY   prochlorperazine  (COMPAZINE ) 10 MG tablet Take 1 tablet (10 mg total) by mouth every 6 (six) hours as needed for nausea or vomiting.   sertraline  (ZOLOFT ) 100 MG tablet TAKE 1 TABLET BY MOUTH EVERY DAY   [DISCONTINUED] albuterol  (VENTOLIN  HFA) 108 (90 Base) MCG/ACT inhaler INHALE 2 PUFFS INTO THE LUNGS EVERY 6 HOURS AS NEEDED FOR WHEEZING OR SHORTNESS OF BREATH   [DISCONTINUED] alendronate  (FOSAMAX ) 70 MG tablet TAKE 1 TABLET(70 MG) BY MOUTH EVERY 7 DAYS WITH A FULL GLASS OF WATER  AND ON AN EMPTY STOMACH   [DISCONTINUED] ALPRAZolam  (XANAX ) 0.5 MG tablet TAKE 1 TABLET BY MOUTH EVERY NIGHT AT BEDTIME. MAY TAKE 1/2 TABLET DURING THE DAY AS NEEDED   [DISCONTINUED] apixaban  (ELIQUIS ) 5 MG TABS tablet Take 1 tablet (5 mg total) by mouth 2 (two) times daily. Pt DUE for FOLLOW-UP, MUST see PROVIDER for FUTURE refills.   [DISCONTINUED] diclofenac  Sodium (VOLTAREN ) 1 % GEL Apply 2 g topically 4 (four) times daily.   [DISCONTINUED] indapamide  (LOZOL ) 1.25 MG tablet TAKE 1 TABLET BY MOUTH EVERY MORNING   [DISCONTINUED] levothyroxine  (SYNTHROID ) 125 MCG tablet TAKE 1 TABLET BY MOUTH DAILY   [DISCONTINUED] metoprolol  succinate (TOPROL -XL) 25 MG 24 hr tablet TAKE 1 TABLET BY MOUTH DAILY   [DISCONTINUED] Nintedanib (OFEV ) 150 MG CAPS Take 1 capsule (150 mg total) by mouth 2 (two) times daily.   [DISCONTINUED] pantoprazole  (PROTONIX ) 40 MG tablet TAKE 1 TABLET(40 MG) BY MOUTH DAILY   [DISCONTINUED] potassium chloride  (KLOR-CON  M) 10 MEQ tablet TAKE 1 TABLET(10 MEQ) BY MOUTH DAILY   [DISCONTINUED] sertraline  (ZOLOFT ) 100 MG tablet 1 qd   No facility-administered encounter medications on file as of 07/07/2024.    Allergies  (verified) Erythromycin, Aspirin, and Ibuprofen   History: Past Medical History:  Diagnosis Date   Arthritis    CHF (congestive heart failure) (HCC)    Chronic Leukemia    Depression    Dysrhythmia    A.fib   on Eliquis    H/O hiatal hernia    Hernia    Hernia    right inguinal hernia   Hypertension    Hypothyroidism    Leg swelling    Osteitis pubis (HCC) 05/30/2001   Osteopenia 03/30/2009   Osteopenia 07/30/2022   Bone density 2021 showed elevated FRAX patient was started on Fosamax    Pneumonia    Thyroid  disease    Past Surgical History:  Procedure Laterality Date   ABDOMINAL HYSTERECTOMY  03/1970   APPENDECTOMY  03/1966   BILATERAL SALPINGOOPHORECTOMY     CARDIOVERSION N/A 03/10/2022   Procedure: CARDIOVERSION;  Surgeon: Francyne Headland, MD;  Location: [facility];  Service: Cardiovascular;  Laterality: N/A;   CATARACT EXTRACTION W/PHACO Right 09/25/2014   Procedure: CATARACT EXTRACTION PHACO AND INTRAOCULAR LENS PLACEMENT (IOC);  Surgeon: Oneil T. Roz, MD;  Location: [facility];  Service: Ophthalmology;  Laterality: Right;  CDE:7.56   CATARACT EXTRACTION W/PHACO Left 10/09/2014   Procedure: CATARACT EXTRACTION PHACO AND INTRAOCULAR LENS PLACEMENT LEFT EYE CDE=6.51;  Surgeon: Oneil T. Roz, MD;  Location: [facility];  Service: Ophthalmology;  Laterality: Left;   CHOLECYSTECTOMY  1980   COLONOSCOPY  Aug 11, 1998   COLONOSCOPY  March 2007   Repeat 10 years   COLONOSCOPY  11/06/2003    Internal and external hemorrhoids/Two tiny rectal distal sigmoid polyps cold biopsied/Otherwise within normal limits to the cecum   COLONOSCOPY N/A 08/29/2014   Dr. Shaaron: Sessile polyp was found at the cecum status post cold snare polypectomy, tubular adenoma. No further surveillance unless clinical status changes   CYSTOSCOPY N/A 05/24/2024   Procedure: CYSTOSCOPY;  Surgeon: Viktoria Comer SAUNDERS, MD;  Location: [facility];  Service: Gynecology;  Laterality: N/A;   ESOPHAGOGASTRODUODENOSCOPY  08-11-2006   Dr.  Shaaron: normal esophagus, small tom oderate sized hiatal hernia, Cameron lesions   EXAM UNDER ANESTHESIA, PELVIC N/A 05/24/2024   Procedure: EXAM UNDER ANESTHESIA, PELVIC;  Surgeon: Viktoria Comer SAUNDERS, MD;  Location: [facility];  Service: Gynecology;  Laterality: N/A;   IR IMAGING GUIDED PORT INSERTION  05/23/2024   SKIN CANCER EXCISION Left 11 1 2015/08/12   left arm squamous cancer   TUMOR REMOVAL  03/1968   ovarian   Family History  Problem Relation Age of Onset   Dementia Mother    Heart disease Father    Cancer Father        throat   Cancer Sister        breast   Breast cancer Sister    Lung cancer Grandson    Colon cancer Neg Hx    Lung disease Neg Hx    Autoimmune disease Neg Hx    Social History   Socioeconomic History   Marital status: Widowed    Spouse name: Not on file   Number of children: 1   Years of education: 5   Highest education level: Not on file  Occupational History   Occupation: Retired    Associate Professor: RETIRED    Comment: Ship broker, 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   Widowed since August 11, 2021. Husband W.E. Regis, died from Kidney Cancer.   Son-Tommy lives nearby.   2 grandson's deceased, Clint and Penne. Penne passed 02/2022 from Brain Cancer.    Social Drivers of Corporate investment banker Strain: Low Risk  (07/07/2024)   Overall Financial Resource Strain (CARDIA)    Difficulty of Paying Living Expenses: Not hard at all  Food Insecurity: No Food Insecurity (07/07/2024)   Hunger Vital Sign    Worried About Running Out of Food in the Last  Year: Never true    Ran Out of Food in the Last Year: Never true  Transportation Needs: No  Transportation Needs (07/07/2024)   PRAPARE - Administrator, Civil Service (Medical): No    Lack of Transportation (Non-Medical): No  Physical Activity: Inactive (07/07/2024)   Exercise Vital Sign    Days of Exercise per Week: 0 days    Minutes of Exercise per Session: Not on file  Stress: No Stress Concern Present (07/07/2024)   [employer] of Occupational Health - Occupational Stress Questionnaire    Feeling of Stress: Only a little  Social Connections: Moderately Integrated (07/07/2024)   Social Connection and Isolation Panel    Frequency of Communication with Friends and Family: More than three times a week    Frequency of Social Gatherings with Friends and Family: Three times a week    Attends Religious Services: More than 4 times per year    Active Member of Clubs or Organizations: Yes    Attends Banker Meetings: More than 4 times per year    Marital Status: Widowed    Tobacco Counseling Counseling given: Not Answered Tobacco comments: Never smoked    Clinical Intake:  Pre-visit preparation completed: Yes  Pain : No/denies pain  Diabetes: No   How often do you need to have someone help you when you read instructions, pamphlets, or other written materials from your doctor or pharmacy?: 1 - Never  Interpreter Needed?: No  Information entered by :: Charmaine Bloodgood LPN   Activities of Daily Living     07/07/2024    1:26 PM 05/24/2024    6:02 AM  In your present state of health, do you have any difficulty performing the following activities:  Hearing? 0 1  Vision? 0 1  Difficulty concentrating or making decisions? 0 1  Walking or climbing stairs? 0   Dressing or bathing? 0   Doing errands, shopping? 0   Preparing Food and eating ? N   Using the Toilet? N   In the past six months, have you accidently leaked urine? N   Do you have problems with loss  of bowel control? N   Managing your Medications? N   Managing your Finances? N   Housekeeping or managing your Housekeeping? N     Patient Care Team: [name], MD as PCP - General (Family Medicine) [name], Jerel, MD as PCP - Cardiology (Cardiology) Shaaron Lamar HERO, MD as Consulting Physician (Gastroenterology) [facility] Lonn Hicks, MD as Consulting Physician (Hematology and Oncology) Viktoria Comer SAUNDERS, MD as Consulting Physician (Gynecologic Oncology)  I have updated your Care Teams any recent Medical Services you may have received from other providers in the past year.     Assessment:   This is a routine wellness examination for Zeynep.  Hearing/Vision screen Hearing Screening - Comments:: Denies hearing difficulties   Vision Screening - Comments:: Wears rx glasses - up to date with routine eye exams with [facility]    Goals Addressed             This Visit's Progress    Maintain health and independence   On track      Depression Screen     07/07/2024    1:25 PM 06/30/2024   12:29 PM 06/12/2024    9:42 AM 06/01/2024    8:15 AM 05/01/2024   10:19 AM 04/05/2024   12:05 PM 03/08/2024    2:08 PM  PHQ 2/9 Scores  PHQ - 2 Score 0 0 2 0 0 4 1  PHQ- 9 Score 5  8  0 8 3    Fall Risk     07/07/2024    1:26 PM 06/12/2024    9:42 AM 05/01/2024   10:19 AM 03/08/2024    2:08 PM 02/17/2024   11:01 AM  Fall Risk   Falls in the past year? 0 0 0 0 1  Number falls in past yr: 0   0 0  Injury with Fall? 0   0 0  Risk for fall due to : Impaired mobility;Impaired balance/gait   No Fall Risks   Follow up Falls prevention discussed;Education provided;Falls evaluation completed   Falls evaluation completed     MEDICARE RISK AT HOME:  Medicare Risk at Home Any stairs in or around the home?: No If so, are there any without handrails?: No Home free of loose throw rugs in walkways, pet beds, electrical cords, etc?: Yes Adequate lighting in your home to  reduce risk of falls?: Yes Life alert?: No Use of a cane, walker or w/c?: Yes Grab bars in the bathroom?: Yes Shower chair or bench in shower?: No Elevated toilet seat or a handicapped toilet?: Yes  TIMED UP AND GO:  Was the test performed?  No  Cognitive Function: Declined/Normal: No cognitive concerns noted by patient or family. Patient alert, oriented, able to answer questions appropriately and recall recent events. No signs of memory loss or confusion.        07/02/2023    2:09 PM 06/09/2022    1:48 PM  6CIT Screen  What Year? 0 points 0 points  What month? 0 points 0 points  What time? 0 points 0 points  Count back from 20 0 points 0 points  Months in reverse 0 points 0 points  Repeat phrase 0 points 0 points  Total Score 0 points 0 points    Immunizations Immunization History  Administered Date(s) Administered   Fluad Quad(high Dose 65+) 09/24/2020, 09/04/2022   Fluad Trivalent(High Dose 65+) 08/17/2023   Influenza, High Dose Seasonal PF 08/30/2021   Influenza,inj,Quad PF,6+ Mos 09/06/2018   Influenza-Unspecified 08/31/2015, 08/06/2016, 09/16/2017, 10/19/2019   PFIZER(Purple Top)SARS-COV-2 Vaccination 12/04/2019, 12/26/2019   Pneumococcal Conjugate-13 06/19/2014   Pneumococcal Polysaccharide-23 10/05/2017   RSV,unspecified 11/26/2023   Zoster Recombinant(Shingrix) 01/05/2023   Zoster, Live 10/05/2012    Screening Tests Health Maintenance  Topic Date Due   COVID-19 Vaccine (3 - Pfizer risk series) 01/23/2020   Zoster Vaccines- Shingrix (2 of 2) 03/02/2023   INFLUENZA VACCINE  07/31/2024 (Originally 06/30/2024)   DTaP/Tdap/Td (1 - Tdap) 02/16/2025 (Originally 02/09/1955)   Medicare Annual Wellness (AWV)  07/07/2025   Pneumococcal Vaccine: 50+ Years  Completed   DEXA SCAN  Completed   Hepatitis B Vaccines  Aged Out   HPV VACCINES  Aged Out   Meningococcal B Vaccine  Aged Out    Health Maintenance  Health Maintenance Due  Topic Date Due   COVID-19 Vaccine (3  - Pfizer risk series) 01/23/2020   Zoster Vaccines- Shingrix (2 of 2) 03/02/2023   Health Maintenance Items Addressed: Patient will discuss need for dexa further with pcp due to other recent scans   Additional Screening:  Vision Screening: Recommended annual ophthalmology exams for early detection of glaucoma and other disorders of the eye. Would you like a referral to an eye doctor? No    Dental Screening: Recommended annual dental exams for proper oral hygiene  Community Resource Referral / Chronic Care Management: CRR required this visit?  No   CCM required this visit?  No  Plan:    I have personally reviewed and noted the following in the patient's chart:   Medical and social history Use of alcohol, tobacco or illicit drugs  Current medications and supplements including opioid prescriptions. Patient is not currently taking opioid prescriptions. Functional ability and status Nutritional status Physical activity Advanced directives List of other physicians Hospitalizations, surgeries, and ER visits in previous 12 months Vitals Screenings to include cognitive, depression, and falls Referrals and appointments  In addition, I have reviewed and discussed with patient certain preventive protocols, quality metrics, and best practice recommendations. A written personalized care plan for preventive services as well as general preventive health recommendations were provided to patient.   Lavelle Pfeiffer [place], [place]   11/30/7972   After Visit Summary: (MyChart) Due to this being a telephonic visit, the after visit summary with patients personalized plan was offered to patient via MyChart   Notes: Nothing significant to report at this time.

## 2024-07-08 ENCOUNTER — Other Ambulatory Visit: Payer: Self-pay

## 2024-07-10 ENCOUNTER — Ambulatory Visit (HOSPITAL_COMMUNITY)
Admission: RE | Admit: 2024-07-10 | Discharge: 2024-07-10 | Disposition: A | Discharge status: Home / Self Care | Source: Ambulatory Visit | Attending: Gynecologic Oncology | Admitting: Gynecologic Oncology

## 2024-07-10 ENCOUNTER — Ambulatory Visit

## 2024-07-10 DIAGNOSIS — Z90710 Acquired absence of both cervix and uterus: Secondary | ICD-10-CM | POA: Diagnosis not present

## 2024-07-10 DIAGNOSIS — N3289 Other specified disorders of bladder: Secondary | ICD-10-CM | POA: Diagnosis not present

## 2024-07-10 DIAGNOSIS — C52 Malignant neoplasm of vagina: Secondary | ICD-10-CM | POA: Diagnosis not present

## 2024-07-10 DIAGNOSIS — M7989 Other specified soft tissue disorders: Secondary | ICD-10-CM | POA: Diagnosis not present

## 2024-07-10 DIAGNOSIS — K4020 Bilateral inguinal hernia, without obstruction or gangrene, not specified as recurrent: Secondary | ICD-10-CM | POA: Diagnosis not present

## 2024-07-10 MED ORDER — GADOBUTROL 1 MMOL/ML IV SOLN
9.0000 mL | Freq: Once | INTRAVENOUS | Status: AC | PRN
  Administered 2024-07-10 (×2): 9 mL via INTRAVENOUS

## 2024-07-11 ENCOUNTER — Ambulatory Visit

## 2024-07-12 ENCOUNTER — Telehealth: Payer: Self-pay | Admitting: *Deleted

## 2024-07-12 ENCOUNTER — Ambulatory Visit

## 2024-07-12 NOTE — Telephone Encounter (Signed)
 Patient called and stated that I want to cancel my appt with Dr Viktoria at [facility]. I just don't think I can do it. (interstitial brachytherapy) But Dr Shannon told me not to cancel the appt until I talk with her.

## 2024-07-13 ENCOUNTER — Ambulatory Visit

## 2024-07-13 ENCOUNTER — Telehealth: Payer: Self-pay

## 2024-07-13 ENCOUNTER — Other Ambulatory Visit: Payer: Self-pay | Admitting: Radiation Oncology

## 2024-07-13 ENCOUNTER — Telehealth: Payer: Self-pay | Admitting: Family Medicine

## 2024-07-13 ENCOUNTER — Telehealth: Payer: Self-pay | Admitting: Gynecologic Oncology

## 2024-07-13 ENCOUNTER — Other Ambulatory Visit: Payer: Self-pay | Admitting: Nurse Practitioner

## 2024-07-13 DIAGNOSIS — C52 Malignant neoplasm of vagina: Secondary | ICD-10-CM

## 2024-07-13 MED ORDER — METOPROLOL SUCCINATE ER 25 MG PO TB24: 25.0000 mg | ORAL_TABLET | Freq: Every day | ORAL | 1 refills | Status: DC

## 2024-07-13 MED ORDER — DIPHENOXYLATE-ATROPINE 2.5-0.025 MG PO TABS: 1.0000 | ORAL_TABLET | Freq: Four times a day (QID) | ORAL | 0 refills | Status: DC | PRN

## 2024-07-13 NOTE — Telephone Encounter (Signed)
 Done

## 2024-07-13 NOTE — Telephone Encounter (Signed)
 Patient's grand daughter  [name] called in asking for MRI pelvis results. Per [name] patient is having severe low abdominal pain  and diarrhea. Patient is currently taking Imodium AD and Pepto Bismol. Patient reports feeling worse since finishing chemo and radiation. Dr. Lewie office made aware.

## 2024-07-13 NOTE — Telephone Encounter (Signed)
 I returned [name]'s call.  We reviewed in detail MRI findings including shrinkage of the tumor at the vaginal cuff as well as inflammation seen within the bladder and rectum secondary to her recent radiation.  Discussed again the importance of the additional radiation.  Strongly encourage that the patient at least meet with Dr. Jacklin to discuss her recommendations based on interval MRI and ultimate recommendation regarding interstitial versus intracavitary radiation.  If the patient ultimately decides that she does not want to go to this visit or that she does not want any additional radiation treatment, we discussed what the follow-up plan would be.  I have asked [name] to call me either after their visit or if the patient elects not to proceed with meeting with Dr. Jacklin.  Comer Dollar MD Gynecologic Oncology

## 2024-07-13 NOTE — Telephone Encounter (Signed)
 Called and spoke with [name] ( grand daughter) to make aware that Dr. Shannon has ordered CBC w/ diff, CMP, and UA c&S on 07/14/24 @ 3 pm. [name] verbalized understanding. Dr. Shannon to  send prescription for diarrhea to  [facility] on [address]

## 2024-07-13 NOTE — Telephone Encounter (Signed)
 Refill on metoprolol  succinate (TOPROL -XL) 25 MG 24 hr tablet   [facility]

## 2024-07-14 ENCOUNTER — Ambulatory Visit
Admission: RE | Admit: 2024-07-14 | Discharge: 2024-07-14 | Disposition: A | Discharge status: Home / Self Care | Source: Ambulatory Visit | Attending: Hematology and Oncology | Admitting: Hematology and Oncology

## 2024-07-14 DIAGNOSIS — Z510 Encounter for antineoplastic radiation therapy: Secondary | ICD-10-CM | POA: Diagnosis not present

## 2024-07-14 DIAGNOSIS — C52 Malignant neoplasm of vagina: Secondary | ICD-10-CM

## 2024-07-14 LAB — URINALYSIS, COMPLETE (UACMP) WITH MICROSCOPIC
Bilirubin Urine: NEGATIVE
Glucose, UA: NEGATIVE mg/dL
Ketones, ur: NEGATIVE mg/dL
Nitrite: NEGATIVE
Protein, ur: 100 mg/dL — AB
Specific Gravity, Urine: 1.02 (ref 1.005–1.030)
WBC, UA: >50 WBC/hpf (ref 0–5)
pH: 5 (ref 5.0–8.0)

## 2024-07-14 LAB — CMP (CANCER CENTER ONLY)
ALT: 7 U/L (ref 0–44)
AST: 15 U/L (ref 15–41)
Albumin: 3.8 g/dL (ref 3.5–5.0)
Alkaline Phosphatase: 72 U/L (ref 38–126)
Anion gap: 7 (ref 5–15)
BUN: 11 mg/dL (ref 8–23)
CO2: 30 mmol/L (ref 22–32)
Calcium: 9.1 mg/dL (ref 8.9–10.3)
Chloride: 98 mmol/L (ref 98–111)
Creatinine: 0.88 mg/dL (ref 0.44–1.00)
GFR, Estimated: >60 mL/min (ref >60)
Glucose, Bld: 118 mg/dL — ABNORMAL HIGH (ref 70–99)
Potassium: 3.8 mmol/L (ref 3.5–5.1)
Sodium: 135 mmol/L (ref 135–145)
Total Bilirubin: 0.4 mg/dL (ref 0.0–1.2)
Total Protein: 6.6 g/dL (ref 6.5–8.1)

## 2024-07-14 LAB — CBC WITH DIFFERENTIAL (CANCER CENTER ONLY)
Abs Immature Granulocytes: 0.05 K/uL (ref 0.00–0.07)
Basophils Absolute: 0.1 K/uL (ref 0.0–0.1)
Basophils Relative: 1 %
Eosinophils Absolute: 0.1 K/uL (ref 0.0–0.5)
Eosinophils Relative: 1 %
HCT: 30.9 % — ABNORMAL LOW (ref 36.0–46.0)
Hemoglobin: 10.5 g/dL — ABNORMAL LOW (ref 12.0–15.0)
Immature Granulocytes: 1 %
Lymphocytes Relative: 24 %
Lymphs Abs: 2 K/uL (ref 0.7–4.0)
MCH: 33.2 pg (ref 26.0–34.0)
MCHC: 34 g/dL (ref 30.0–36.0)
MCV: 97.8 fL (ref 80.0–100.0)
Monocytes Absolute: 0.7 K/uL (ref 0.1–1.0)
Monocytes Relative: 8 %
Neutro Abs: 5.5 K/uL (ref 1.7–7.7)
Neutrophils Relative %: 65 %
Platelet Count: 211 K/uL (ref 150–400)
RBC: 3.16 MIL/uL — ABNORMAL LOW (ref 3.87–5.11)
RDW: 18.3 % — ABNORMAL HIGH (ref 11.5–15.5)
WBC Count: 8.4 K/uL (ref 4.0–10.5)
nRBC: 0 % (ref 0.0–0.2)

## 2024-07-14 LAB — MAGNESIUM: Magnesium: 1.6 mg/dL — ABNORMAL LOW (ref 1.7–2.4)

## 2024-07-15 DIAGNOSIS — M109 Gout, unspecified: Secondary | ICD-10-CM | POA: Diagnosis not present

## 2024-07-15 DIAGNOSIS — R030 Elevated blood-pressure reading, without diagnosis of hypertension: Secondary | ICD-10-CM | POA: Diagnosis not present

## 2024-07-16 LAB — URINE CULTURE: Culture: 20000 — AB

## 2024-07-17 ENCOUNTER — Encounter: Payer: Self-pay | Admitting: Hematology and Oncology

## 2024-07-17 ENCOUNTER — Telehealth: Payer: Self-pay

## 2024-07-17 ENCOUNTER — Other Ambulatory Visit: Payer: Self-pay | Admitting: Radiation Oncology

## 2024-07-17 MED ORDER — CIPROFLOXACIN HCL 250 MG PO TABS: 250.0000 mg | ORAL_TABLET | Freq: Two times a day (BID) | ORAL | 0 refills | Status: DC

## 2024-07-17 NOTE — Telephone Encounter (Signed)
 Called back [place] regarding an after hours call but her line was busy. Attempted to call back granddaughter regarding her grandmother's foot pain on her left foot. From the after hours report, she stated it was tingling and hurting.This foot and ankle pain is making it hard for her to walk. Unable to reach Ariann and had to leave [place], her granddaughter a voice message. Andrea CHRISTELLA Plunk, RN

## 2024-07-18 ENCOUNTER — Ambulatory Visit: Admitting: Cardiovascular Disease

## 2024-07-20 ENCOUNTER — Ambulatory Visit: Admitting: Family Medicine

## 2024-07-20 VITALS — BP 138/80 | HR 69 | Temp 97.2°F | Ht 66.0 in | Wt 185.0 lb

## 2024-07-20 DIAGNOSIS — C52 Malignant neoplasm of vagina: Secondary | ICD-10-CM | POA: Diagnosis not present

## 2024-07-20 DIAGNOSIS — J84112 Idiopathic pulmonary fibrosis: Secondary | ICD-10-CM | POA: Diagnosis not present

## 2024-07-20 DIAGNOSIS — Z66 Do not resuscitate: Secondary | ICD-10-CM | POA: Diagnosis not present

## 2024-07-20 NOTE — Progress Notes (Signed)
   Subjective:    Patient ID: [name], female    DOB: December 23, 1935, 88 y.o.   MRN: [id number]  HPI Patient recently diagnosed with vaginal cancer Been treated with surgery in additional measures Her specialist set her up with a group at [facility] to do a specialized treatment but the patient deferred on doing that treatment because of transportation issues.  She is open to doing further treatments locally with [facility] whether it be chemotherapy or radiation   She does have underlying pulmonary fibrosis but that has been relatively slow in regards to progressing and does not appear to be an aggressive form currently  She relates that she still has a strong desire to keep moving forward and is hopeful for her near future she would like to be around for her children but at the same time she does not want to have any type of heroic measures and she relates that she is willing to go forward with treatments but would not want resuscitative measures  Information reviewed Discussion held with patient  Review of Systems     Objective:   Physical Exam General-in no acute distress Eyes-no discharge Lungs-respiratory rate normal, CTA CV-no murmurs,RRR Extremities skin warm dry no edema Neuro grossly normal Behavior normal, alert        Assessment & Plan:  1. Vaginal cancer (HCC) (Primary) Patient states that she has declined the treatment at [facility] because of the procedure being proposed She will follow through with her local oncologist Her mindset facing this issue is she is willing to undergo ongoing either chemotherapy or radiation but does not want to go through any measure that would not have reasonable potential to improve her Also if things ever became hopeless she would not want to have any procedure to go through that would not meaningfully prolong life  As for resuscitation status she states that she is DNR-at the same time though she is still open to ongoing therapy and  treatment  2. Idiopathic pulmonary fibrosis (HCC) She does see her specialist she is holding her own for now  Trying to do the best she can 30 minutes spent with patient discussing all these issues including end-of-life issues

## 2024-07-21 ENCOUNTER — Telehealth: Payer: Self-pay | Admitting: Oncology

## 2024-07-21 ENCOUNTER — Encounter: Payer: Self-pay | Admitting: Radiology

## 2024-07-21 NOTE — Telephone Encounter (Signed)
 Left a message regarding appointment with [facility] Radiation Oncology.  Requested a return call.

## 2024-07-21 NOTE — Telephone Encounter (Signed)
 Shaia called back and said she was not able to get transportation for the appointment with Dr. Jacklin at [facility] and is not going to reschedule the appointment.  She said Dr. Jacklin had called her directly and she has decided not to have the interstitial brachytherapy because of how she is feeling.  Let her know I will update Dr. Viktoria and encouraged her to call with any needs.

## 2024-07-23 DIAGNOSIS — Z66 Do not resuscitate: Secondary | ICD-10-CM | POA: Insufficient documentation

## 2024-07-24 ENCOUNTER — Other Ambulatory Visit (HOSPITAL_COMMUNITY)
Admission: RE | Admit: 2024-07-24 | Discharge: 2024-07-24 | Disposition: A | Discharge status: Home / Self Care | Source: Other Acute Inpatient Hospital | Attending: Family Medicine | Admitting: Family Medicine

## 2024-07-24 ENCOUNTER — Ambulatory Visit: Payer: Self-pay | Admitting: Family Medicine

## 2024-07-24 ENCOUNTER — Ambulatory Visit (INDEPENDENT_AMBULATORY_CARE_PROVIDER_SITE_OTHER): Admitting: Family Medicine

## 2024-07-24 VITALS — BP 138/84 | HR 88 | Temp 97.2°F | Ht 66.0 in | Wt 182.0 lb

## 2024-07-24 DIAGNOSIS — K921 Melena: Secondary | ICD-10-CM | POA: Diagnosis not present

## 2024-07-24 DIAGNOSIS — N3001 Acute cystitis with hematuria: Secondary | ICD-10-CM | POA: Diagnosis not present

## 2024-07-24 DIAGNOSIS — R300 Dysuria: Secondary | ICD-10-CM

## 2024-07-24 LAB — POCT URINE DIPSTICK
Bilirubin, UA: NEGATIVE
Glucose, UA: NEGATIVE mg/dL
Ketones, POC UA: NEGATIVE mg/dL
Nitrite, UA: NEGATIVE
POC PROTEIN,UA: 100 — AB
Spec Grav, UA: 1.02 (ref 1.010–1.025)
Urobilinogen, UA: 0.2 U/dL
pH, UA: 6 (ref 5.0–8.0)

## 2024-07-24 LAB — CBC
HCT: 31.9 % — ABNORMAL LOW (ref 36.0–46.0)
Hemoglobin: 10.7 g/dL — ABNORMAL LOW (ref 12.0–15.0)
MCH: 33.9 pg (ref 26.0–34.0)
MCHC: 33.5 g/dL (ref 30.0–36.0)
MCV: 100.9 fL — ABNORMAL HIGH (ref 80.0–100.0)
Platelets: 185 K/uL (ref 150–400)
RBC: 3.16 MIL/uL — ABNORMAL LOW (ref 3.87–5.11)
RDW: 18.4 % — ABNORMAL HIGH (ref 11.5–15.5)
WBC: 8.9 K/uL (ref 4.0–10.5)
nRBC: 0 % (ref 0.0–0.2)

## 2024-07-24 MED ORDER — PANTOPRAZOLE SODIUM 40 MG PO TBEC: DELAYED_RELEASE_TABLET | ORAL | 1 refills | Status: DC

## 2024-07-24 MED ORDER — CIPROFLOXACIN HCL 500 MG PO TABS: 500.0000 mg | ORAL_TABLET | Freq: Two times a day (BID) | ORAL | 0 refills | Status: DC

## 2024-07-24 NOTE — Patient Instructions (Signed)
 HOLD Eliquis .  Start Protonix .  Stat CBC at the hospital.  If you worsen, go directly to the ER.  Follow up in 48 hours.

## 2024-07-25 DIAGNOSIS — K921 Melena: Secondary | ICD-10-CM | POA: Insufficient documentation

## 2024-07-25 NOTE — Progress Notes (Signed)
 Subjective:  Patient ID: [name], female    DOB: March 28, 1936  Age: 88 y.o. MRN: [id number]  CC:   Chief Complaint  Patient presents with   abd pain with diarrhea , black in color    This morning , none since then, pt reports recent vaginal cancer tx and uti symptoms with trouble urinating- reports small amounts of urine    HPI:  88 year old female presents for evaluation of the above.  Patient states that this morning she was in the shower and subsequently had an unexpected loose bowel movement that was black in color.  She reports a remote history of GI bleed.  She has had some abdominal pain but has none currently.  The patient also reports that she is experiencing urinary urgency/frequency.  Recently treated for UTI with 3 days of ciprofloxacin .  Patient Active Problem List   Diagnosis Date Noted   Melena 07/25/2024   DNR (do not resuscitate) 07/23/2024   Pancytopenia, acquired (HCC) 06/16/2024   Anemia due to antineoplastic chemotherapy 05/26/2024   Constipation 05/24/2024   Hypomagnesemia 05/16/2024   Goals of care, counseling/discussion 05/12/2024   Vaginal cancer (HCC) 04/28/2024   Idiopathic peripheral neuropathy 04/28/2024   Hearing deficit, bilateral 04/28/2024   Cancer associated pain 04/28/2024   Postmenopausal vaginal bleeding 03/29/2024   Urinary incontinence 03/29/2024   Acute cystitis with hematuria 03/08/2024   Chronic diastolic (congestive) heart failure (HCC) 01/28/2023   Lymphoproliferative disease (HCC) 01/28/2023   Idiopathic pulmonary fibrosis (HCC) 08/18/2022   Osteopenia 07/30/2022   Persistent atrial fibrillation (HCC)    Monoclonal B-cell lymphocytosis 01/21/2022   Dyspnea on exertion 05/24/2015   Bilateral leg edema 05/24/2015   GERD (gastroesophageal reflux disease) 07/02/2014   Hyperlipidemia 05/16/2013   Hypothyroidism 05/16/2013   Essential hypertension, benign 05/16/2013    Social Hx   Social History   Socioeconomic History    Marital status: Widowed    Spouse name: Not on file   Number of children: 1   Years of education: 39   Highest education level: Not on file  Occupational History   Occupation: Retired    Associate Professor: RETIRED    Comment: Ship broker, 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   Widowed since 19-Jul-2021. Husband W.E. Odoherty, died from Kidney Cancer.   Son-Tommy lives nearby.   2 grandson's deceased, Clint and Penne. Penne passed 02/2022 from Brain Cancer.    Social Drivers of Corporate investment banker Strain: Low Risk  (07/07/2024)   Overall Financial Resource Strain (CARDIA)    Difficulty of Paying Living Expenses: Not hard at all  Food Insecurity: No Food Insecurity (07/07/2024)   Hunger Vital Sign    Worried About Running Out of Food in the Last Year: Never true    Ran Out of Food in the Last Year: Never true  Transportation Needs: No Transportation Needs (07/07/2024)   PRAPARE - Administrator, Civil Service (Medical): No    Lack of Transportation (Non-Medical): No  Physical Activity: Inactive (07/07/2024)   Exercise Vital Sign    Days of Exercise per Week: 0 days    Minutes of Exercise per Session: Not on file  Stress: No Stress Concern Present (07/07/2024)   [employer] of Occupational Health - Occupational Stress Questionnaire    Feeling of Stress: Only a little  Social Connections: Moderately Integrated (07/07/2024)   Social Connection and Isolation Panel    Frequency of Communication with Friends and Family: More than three times a week    Frequency of  Social Gatherings with Friends and Family: Three times a week    Attends Religious Services: More than 4 times per year    Active Member of Clubs or Organizations: Yes    Attends Banker Meetings: More than 4 times per year    Marital Status: Widowed    Review of Systems Per HPI  Objective:  BP 138/84   Pulse 88   Temp (!) 97.2 F (36.2 C)   Ht 5' 6 (1.676 m)   Wt 182 lb (82.6 kg)   SpO2 96%   BMI 29.38 kg/m      07/24/2024    3:53 PM 07/24/2024    3:52 PM 07/20/2024   11:00 AM  BP/Weight  Systolic BP 138 141 138  Diastolic BP 84 85 80  Wt. (Lbs)  182 185  BMI  29.38 kg/m2 29.86 kg/m2    Physical Exam Vitals and nursing note reviewed.  Constitutional:      Appearance: Normal appearance.  HENT:     Head: Normocephalic and atraumatic.  Eyes:     General:        Right eye: No discharge.     Conjunctiva/sclera: Conjunctivae normal.  Cardiovascular:     Rate and Rhythm: Normal rate and regular rhythm.  Pulmonary:     Effort: Pulmonary effort is normal.     Breath sounds: Normal breath sounds.  Abdominal:     General: There is no distension.     Palpations: Abdomen is soft.     Tenderness: There is no abdominal tenderness.  Neurological:     Mental Status: She is alert.     Lab Results  Component Value Date   WBC 8.9 07/24/2024   HGB 10.7 (L) 07/24/2024   HCT 31.9 (L) 07/24/2024   PLT 185 07/24/2024   GLUCOSE 118 (H) 07/14/2024   CHOL 166 04/14/2023   TRIG 82 04/14/2023   HDL 65 04/14/2023   LDLCALC 86 04/14/2023   ALT 7 07/14/2024   AST 15 07/14/2024   NA 135 07/14/2024   K 3.8 07/14/2024   CL 98 07/14/2024   CREATININE 0.88 07/14/2024   BUN 11 07/14/2024   CO2 30 07/14/2024   TSH 4.100 11/05/2023     Assessment & Plan:  Melena Assessment & Plan: Source/prognosis unclear at this time.  Stat CBC today.  Holding Eliquis .  Placing on Protonix .  Patient will follow-up in 48 hours.  She is to go to the hospital if she  worsens.  Orders: -     Pantoprazole  Sodium; TAKE 1 TABLET(40 MG) BY MOUTH DAILY  Dispense: 90 tablet; Refill: 1 -     CBC  Acute cystitis with hematuria Assessment & Plan: Prior culture reviewed.  Creatinine clearance 60.  Patient was recently treated with Cipro  250 mg twice daily.  Current dosing recommendations reflect that she should be on 500 mg twice daily.  Rx sent.  Orders: -     POCT URINE  DIPSTICK -     Ciprofloxacin  HCl; Take 1 tablet (500 mg total) by mouth 2 (two) times daily.  Dispense: 14 tablet; Refill: 0    Follow-up: 48 hours  Gianpaolo Mindel Bluford DO [facility]

## 2024-07-25 NOTE — Assessment & Plan Note (Addendum)
 Source/prognosis unclear at this time.  Stat CBC today.  Holding Eliquis .  Placing on Protonix .  Patient will follow-up in 48 hours.  She is to go to the hospital if she worsens.

## 2024-07-25 NOTE — Telephone Encounter (Signed)
 Called [name] and she would like to see Dr. Shannon to discuss further treatment options since she decided not to go to [facility].  Advised I will let Dr. Viktoria and Dr. Shannon know and work on getting her an appointment.

## 2024-07-25 NOTE — Assessment & Plan Note (Signed)
 Prior culture reviewed.  Creatinine clearance 60.  Patient was recently treated with Cipro  250 mg twice daily.  Current dosing recommendations reflect that she should be on 500 mg twice daily.  Rx sent.

## 2024-07-26 ENCOUNTER — Encounter: Payer: Self-pay | Admitting: Hematology and Oncology

## 2024-07-26 ENCOUNTER — Telehealth: Payer: Self-pay

## 2024-07-26 ENCOUNTER — Ambulatory Visit (INDEPENDENT_AMBULATORY_CARE_PROVIDER_SITE_OTHER): Admitting: Family Medicine

## 2024-07-26 ENCOUNTER — Other Ambulatory Visit (INDEPENDENT_AMBULATORY_CARE_PROVIDER_SITE_OTHER)

## 2024-07-26 ENCOUNTER — Telehealth: Payer: Self-pay | Admitting: Family Medicine

## 2024-07-26 VITALS — BP 122/83 | HR 79 | Temp 97.2°F | Ht 66.0 in | Wt 185.0 lb

## 2024-07-26 DIAGNOSIS — N3001 Acute cystitis with hematuria: Secondary | ICD-10-CM | POA: Diagnosis not present

## 2024-07-26 DIAGNOSIS — K921 Melena: Secondary | ICD-10-CM

## 2024-07-26 DIAGNOSIS — R195 Other fecal abnormalities: Secondary | ICD-10-CM

## 2024-07-26 LAB — IFOBT (OCCULT BLOOD): IFOBT: POSITIVE

## 2024-07-26 NOTE — Telephone Encounter (Signed)
 Stool Sample left placed in lab

## 2024-07-26 NOTE — Telephone Encounter (Signed)
 Left a message regarding scheduling an apt with Dr. Shannon this afternoon.  Requested a return call.

## 2024-07-26 NOTE — Telephone Encounter (Signed)
 [name] called back and was not able to come in for an appointment today.  Appointment scheduled with Dr. Shannon for tomorrow, 07/27/2024 at 3:00.

## 2024-07-26 NOTE — Progress Notes (Signed)
 Subjective:  Patient ID: [name], female    DOB: [birth year]  Age: 88 y.o. MRN: [id number]  CC: Follow-up melena   HPI:  88-year female presents for follow-up.  Seen on 8/25.  Had a single bout of melena.  Hemoglobin stable.  No abdominal pain.  Patient presents today for reevaluation.  She has had no additional bowel movements.  Therefore, no melena.  She is feeling well.  No abdominal pain.  Compliant with Protonix .  Continuing to hold Eliquis .  Patient Active Problem List   Diagnosis Date Noted   Melena 07/25/2024   DNR (do not resuscitate) 07/23/2024   Pancytopenia, acquired (HCC) 06/16/2024   Anemia due to antineoplastic chemotherapy 05/26/2024   Constipation 05/24/2024   Hypomagnesemia 05/16/2024   Goals of care, counseling/discussion 05/12/2024   Vaginal cancer (HCC) 04/28/2024   Idiopathic peripheral neuropathy 04/28/2024   Hearing deficit, bilateral 04/28/2024   Cancer associated pain 04/28/2024   Postmenopausal vaginal bleeding 03/29/2024   Urinary incontinence 03/29/2024   Acute cystitis with hematuria 03/08/2024   Chronic diastolic (congestive) heart failure (HCC) 01/28/2023   Lymphoproliferative disease (HCC) 01/28/2023   Idiopathic pulmonary fibrosis (HCC) 08/18/2022   Osteopenia 07/30/2022   Persistent atrial fibrillation (HCC)    Monoclonal B-cell lymphocytosis 01/21/2022   Dyspnea on exertion 05/24/2015   Bilateral leg edema 05/24/2015   GERD (gastroesophageal reflux disease) 07/02/2014   Hyperlipidemia 05/16/2013   Hypothyroidism 05/16/2013   Essential hypertension, benign 05/16/2013    Social Hx   Social History   Socioeconomic History   Marital status: Widowed    Spouse name: Not on file   Number of children: 1   Years of education: 60   Highest education level: Not on file  Occupational History   Occupation: Retired    Associate Professor: RETIRED    Comment: Ship broker, 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   Widowed since July 18, 2021. Husband W.E. Jimerson, died from Kidney Cancer.   Son-Tommy lives nearby.   2 grandson's deceased, Clint and Penne. Penne passed 02/2022 from Brain Cancer.    Social Drivers of Corporate investment banker Strain: Low Risk  (07/07/2024)   Overall Financial Resource Strain (CARDIA)    Difficulty of Paying Living Expenses: Not hard at all  Food Insecurity: No Food Insecurity (07/07/2024)   Hunger Vital Sign    Worried About Running Out of Food in the Last Year: Never true    Ran Out of Food in the Last Year: Never true  Transportation Needs: No Transportation Needs (07/07/2024)   PRAPARE - Administrator, Civil Service (Medical): No    Lack of Transportation (Non-Medical): No  Physical Activity: Inactive (07/07/2024)   Exercise Vital Sign    Days of Exercise per Week: 0 days    Minutes of Exercise per Session: Not on file  Stress: No Stress Concern Present (07/07/2024)   [employer] of Occupational Health - Occupational Stress Questionnaire    Feeling of Stress: Only a  little  Social Connections: Moderately Integrated (07/07/2024)   Social Connection and Isolation Panel    Frequency of Communication with Friends and Family: More than three times a week    Frequency of Social Gatherings with Friends and Family: Three times a week    Attends Religious Services: More than 4 times per year    Active Member of Clubs or Organizations: Yes    Attends Banker Meetings: More than 4 times per year    Marital Status: Widowed    Review of Systems Per  HPI  Objective:  BP 122/83   Pulse 79   Temp (!) 97.2 F (36.2 C)   Ht 5' 6 (1.676 m)   Wt 185 lb (83.9 kg)   SpO2 98%   BMI 29.86 kg/m      07/26/2024    8:50 AM 07/24/2024    3:53 PM 07/24/2024    3:52 PM  BP/Weight  Systolic BP 122 138 141  Diastolic BP 83 84 85  Wt. (Lbs) 185  182  BMI 29.86 kg/m2  29.38 kg/m2    Physical Exam Vitals and nursing note reviewed.  Constitutional:      General: She is not in acute distress.    Appearance: Normal appearance.  HENT:     Head: Normocephalic and atraumatic.  Cardiovascular:     Rate and Rhythm: Normal rate and regular rhythm.  Pulmonary:     Effort: Pulmonary effort is normal.     Breath sounds: Normal breath sounds.  Abdominal:     General: There is no distension.     Palpations: Abdomen is soft.     Tenderness: There is no abdominal tenderness.  Neurological:     Mental Status: She is alert.     Lab Results  Component Value Date   WBC 8.9 07/24/2024   HGB 10.7 (L) 07/24/2024   HCT 31.9 (L) 07/24/2024   PLT 185 07/24/2024   GLUCOSE 118 (H) 07/14/2024   CHOL 166 04/14/2023   TRIG 82 04/14/2023   HDL 65 04/14/2023   LDLCALC 86 04/14/2023   ALT 7 07/14/2024   AST 15 07/14/2024   NA 135 07/14/2024   K 3.8 07/14/2024   CL 98 07/14/2024   CREATININE 0.88 07/14/2024   BUN 11 07/14/2024   CO2 30 07/14/2024   TSH 4.100 11/05/2023     Assessment & Plan:  Melena Assessment & Plan: Has not had any additional melena.  Hemoglobin stable.  Discussed case with her primary care physician.  Will continue to hold Eliquis .  Continue Protonix .  She will follow-up next week.  At that time, decision will be made regarding resuming Eliquis .   Acute cystitis with hematuria Assessment & Plan: Patient states that she is improving on Cipro .  Continue treatment.     Follow-up:  Next week.  Jacqulyn Ahle DO [facility]

## 2024-07-26 NOTE — Progress Notes (Shared)
 Radiation Oncology         [phone] ________________________________  Name: [name] MRN: [id number]  Date: 07/27/2024  DOB: 1936-11-10  Follow-Up Visit Note  CC: [name], MD  [name], MD  No diagnosis found.  Diagnosis:  Stage IVA (cT4, cN1, cM0) Vaginal Cancer   Interval Since Last Radiation:  22 days  Intent: curative  First Treatment Date: 2024-05-23 Last Treatment Date: 2024-07-05   Plan Name: Pelvis Site: Vagina Technique: IMRT Mode: Photon Dose Per Fraction: 1.8 Gy Prescribed Dose (Delivered / Prescribed): 45 Gy / 45 Gy Prescribed Fxs (Delivered / Prescribed): 25 / 25  Narrative:  The patient returns today for routine follow-up. She was last seen in office on 05/10/24 for a consultation visit. Since then, patient completed her radiation treatment which she tolerated quite well. Patient did however endorse experiencing  mild fatigue and diarrhea throughout her treatment for which she was given Imodium for.   In the interval since she was last seen, she presented for a consultation visit with Dr. Lonn on 05/12/24 to discuss further treatment plan. Patient will proceed with concurrent chemoradiation therapy with carboplatin . She returned for a follow up on 05/16/24 where her port placement was canceled due to significant electrolyte abnormalities (significant hypokalemia). She was instructed to discontinue indapamide  and to start magnesium supplement but patient did not. On 05/26/24 port placement was done and chemotherapy was initiated. On 06/09/24 treatment was delayed by a week due to significant anemia. Treatment was then further delayed due to persistent anemia and mild thrombocytopenia.   he was seen by [facility] radiation oncologist and was recommended potential treatment with interstitial brachytherapy.   She presented for a follow up with Dr. Viktoria on 06/23/24 to discuss management and help of interstitial implant procedure.             She  underwent a pelvis MRI on 07/10/24 showing interval decrease in masslike thickening involving the apex of the vaginal cuff and anterior vaginal wall, inseparable from the posterior bladder wall. Findings are consistent with treatment response of vaginal cuff malignancy.      No other significant oncologic interval history since the patient was last seen.                             Allergies:  is allergic to erythromycin, aspirin, and ibuprofen.  Meds: Current Outpatient Medications  Medication Sig Dispense Refill   acetaminophen  (TYLENOL ) 650 MG CR tablet Take 650 mg by mouth every 8 (eight) hours as needed for pain.     albuterol  (VENTOLIN  HFA) 108 (90 Base) MCG/ACT inhaler INHALE 2 PUFFS INTO THE LUNGS EVERY 6 HOURS AS NEEDED FOR WHEEZING OR SHORTNESS OF BREATH 18 g 1   alendronate  (FOSAMAX ) 70 MG tablet TAKE 1 TABLET(70 MG) BY MOUTH EVERY 7 DAYS WITH A FULL GLASS OF WATER  AND ON AN EMPTY STOMACH 4 tablet 3   ALPRAZolam  (XANAX ) 0.5 MG tablet TAKE 1 TABLET BY MOUTH EVERY NIGHT AT BEDTIME. MAY TAKE 1/2 TABLET DURING THE DAY AS NEEDED 45 tablet 4   Cholecalciferol (VITAMIN D3) 2000 UNITS TABS Take 2,000 Units by mouth daily.     ciprofloxacin  (CIPRO ) 500 MG tablet Take 1 tablet (500 mg total) by mouth 2 (two) times daily. 14 tablet 0   diphenoxylate -atropine  (LOMOTIL ) 2.5-0.025 MG tablet Take 1 tablet by mouth 4 (four) times daily as needed for diarrhea or loose stools. Not to exceed  8 tablets per day 30 tablet 0   ELIQUIS  5 MG TABS tablet TAKE 1 TABLET(5 MG) BY MOUTH TWICE DAILY. FOLLOW-UP 180 tablet 1   indapamide  (LOZOL ) 1.25 MG tablet Take 1 tablet (1.25 mg total) by mouth every morning. 90 tablet 0   levothyroxine  (SYNTHROID ) 125 MCG tablet TAKE 1 TABLET BY MOUTH DAILY 90 tablet 1   lidocaine -prilocaine  (EMLA ) cream Apply to affected area once 30 g 3   magnesium gluconate (MAGONATE) 500 (27 Mg) MG TABS tablet Take 500 mg by mouth daily at 6 (six) AM.     metoprolol  succinate (TOPROL -XL) 25  MG 24 hr tablet Take 1 tablet (25 mg total) by mouth daily. 90 tablet 1   Multiple Vitamin (MULTI-VITAMIN DAILY PO) Take 1 tablet by mouth daily.     Nintedanib (OFEV ) 100 MG CAPS Take 1 capsule (100 mg total) by mouth 2 (two) times daily. **dose change** 180 capsule 1   ondansetron  (ZOFRAN ) 8 MG tablet Take 1 tablet (8 mg total) by mouth every 8 (eight) hours as needed for nausea or vomiting. Start on the third day after chemotherapy. 30 tablet 1   ondansetron  (ZOFRAN -ODT) 4 MG disintegrating tablet Take 1 tablet (4 mg total) by mouth every 8 (eight) hours as needed. 20 tablet 0   pantoprazole  (PROTONIX ) 40 MG tablet TAKE 1 TABLET(40 MG) BY MOUTH DAILY 90 tablet 1   potassium chloride  (KLOR-CON  M) 10 MEQ tablet TAKE 1 TABLET(10 MEQ) BY MOUTH DAILY 90 tablet 1   prochlorperazine  (COMPAZINE ) 10 MG tablet Take 1 tablet (10 mg total) by mouth every 6 (six) hours as needed for nausea or vomiting. 30 tablet 1   sertraline  (ZOLOFT ) 100 MG tablet TAKE 1 TABLET BY MOUTH EVERY DAY 90 tablet 1   No current facility-administered medications for this encounter.    Physical Findings: The patient is in no acute distress. Patient is alert and oriented.  vitals were not taken for this visit. .  No significant changes. Lungs are clear to auscultation bilaterally. Heart has regular rate and rhythm. No palpable cervical, supraclavicular, or axillary adenopathy. Abdomen soft, non-tender, normal bowel sounds.   Lab Findings: Lab Results  Component Value Date   WBC 8.9 07/24/2024   HGB 10.7 (L) 07/24/2024   HCT 31.9 (L) 07/24/2024   MCV 100.9 (H) 07/24/2024   PLT 185 07/24/2024    Radiographic Findings: MR Pelvis W Wo Contrast Result Date: 07/12/2024 CLINICAL DATA:  Genital cancer vaginal carcinoma EXAM: MRI PELVIS WITHOUT AND WITH CONTRAST TECHNIQUE: Multiplanar multisequence MR imaging of the pelvis was performed both before and after administration of intravenous contrast. CONTRAST:  9mL GADAVIST   GADOBUTROL  1 MMOL/ML IV SOLN COMPARISON:  04/20/2024 FINDINGS: Urinary Tract: Diffuse urinary bladder wall thickening. Posterior wall of the bladder is inseparable from vaginal mass as below. Bowel: Circumferential wall thickening and mucosal edema of the rectum (series 5, image 16). Vascular/Lymphatic: No pathologically enlarged lymph nodes. No significant vascular abnormality seen. Reproductive: Status post hysterectomy. Interval decrease in masslike thickening involving the apex of the vaginal cuff and anterior vaginal wall, inseparable from the posterior bladder wall (series 5, image 16). Other: Very extensive soft tissue edema and inflammatory stranding throughout the low pelvis (series 4, image 21). Musculoskeletal: No suspicious bone lesions identified. Bilateral inguinal hernias, containing nonobstructed small bowel in the right and fat on the left (series 3, image 22). IMPRESSION: 1. Interval decrease in masslike thickening involving the apex of the vaginal cuff and anterior vaginal wall, inseparable from the posterior  bladder wall. Findings are consistent with treatment response of vaginal cuff malignancy. 2. Very extensive soft tissue edema and inflammatory stranding throughout the low pelvis likely related to local radiation. 3. Circumferential wall thickening and mucosal edema of the rectum, as above likely radiation proctitis. 4. Diffuse urinary bladder wall thickening, as above likely radiation cystitis. 5. Bilateral inguinal hernias, containing nonobstructed small bowel in the right and fat on the left. Electronically Signed   By: Marolyn JONETTA Jaksch M.D.   On: 07/12/2024 22:04    Impression:  Stage IVA (cT4, cN1, cM0) Vaginal Cancer   The patient is recovering from the effects of radiation.  ***  Plan:  ***   *** minutes of total time was spent for this patient encounter, including preparation, face-to-face counseling with the patient and coordination of care, physical exam, and documentation of  the encounter. ____________________________________  Lynwood CHARM Nasuti, PhD, MD  This document serves as a record of services personally performed by Lynwood Nasuti, MD. It was created on his behalf by Reymundo Cartwright, a trained medical scribe. The creation of this record is based on the scribe's personal observations and the provider's statements to them. This document has been checked and approved by the attending provider.

## 2024-07-26 NOTE — Telephone Encounter (Signed)
 error

## 2024-07-26 NOTE — Assessment & Plan Note (Signed)
 Has not had any additional melena.  Hemoglobin stable.  Discussed case with her primary care physician.  Will continue to hold Eliquis .  Continue Protonix .  She will follow-up next week.  At that time, decision will be made regarding resuming Eliquis .

## 2024-07-26 NOTE — Assessment & Plan Note (Signed)
 Patient states that she is improving on Cipro .  Continue treatment.

## 2024-07-26 NOTE — Patient Instructions (Signed)
 If you have another dark stool please let us  know.  Continue holding Eliquis .  Continue Protonox.  Follow up with Glendia next week.

## 2024-07-27 ENCOUNTER — Encounter: Payer: Self-pay | Admitting: Radiation Oncology

## 2024-07-27 ENCOUNTER — Ambulatory Visit
Admission: RE | Admit: 2024-07-27 | Discharge: 2024-07-27 | Disposition: A | Discharge status: Home / Self Care | Source: Ambulatory Visit | Attending: Radiation Oncology | Admitting: Radiation Oncology

## 2024-07-27 ENCOUNTER — Other Ambulatory Visit: Payer: Self-pay | Admitting: Family Medicine

## 2024-07-27 VITALS — BP 153/88 | HR 80 | Temp 97.7°F | Resp 20 | Ht 65.0 in | Wt 185.0 lb

## 2024-07-27 DIAGNOSIS — K921 Melena: Secondary | ICD-10-CM

## 2024-07-27 DIAGNOSIS — C52 Malignant neoplasm of vagina: Secondary | ICD-10-CM

## 2024-07-27 NOTE — Progress Notes (Signed)
 KAMILLAH DIDONATO is here today for follow up post radiation to the pelvic.  They completed their radiation on: 07/05/24  Does the patient complain of any of the following:  Pain:Denies Abdominal bloating: Denies Diarrhea/Constipation: Denies Nausea/Vomiting: Denies Vaginal Discharge: Yes, she reports a grayish brown discharge. Blood in Urine or Stool: Dark stool Urinary Issues (dysuria/incomplete emptying/ incontinence/ increased frequency/urgency): Pressure, she reports having a UTI and currently taking antibiotics. Does patient report using vaginal dilator 2-3 times a week and/or sexually active 2-3 weeks: N/A Post radiation skin changes: Denies   Additional comments if applicable:She reports sting and burning in pelvic area.  BP (!) 153/88 (BP Location: Left Arm, Patient Position: Sitting)   Pulse 80   Temp 97.7 F (36.5 C) (Oral)   Resp 20   Ht 5' 5 (1.651 m)   Wt 185 lb (83.9 kg)   SpO2 100%   BMI 30.79 kg/m

## 2024-08-02 ENCOUNTER — Encounter: Payer: Self-pay | Admitting: Family Medicine

## 2024-08-02 ENCOUNTER — Ambulatory Visit (INDEPENDENT_AMBULATORY_CARE_PROVIDER_SITE_OTHER): Admitting: Family Medicine

## 2024-08-02 VITALS — BP 123/82 | HR 72 | Temp 97.0°F | Ht 65.0 in | Wt 183.0 lb

## 2024-08-02 DIAGNOSIS — R195 Other fecal abnormalities: Secondary | ICD-10-CM

## 2024-08-02 DIAGNOSIS — R339 Retention of urine, unspecified: Secondary | ICD-10-CM | POA: Diagnosis not present

## 2024-08-02 DIAGNOSIS — C52 Malignant neoplasm of vagina: Secondary | ICD-10-CM | POA: Diagnosis not present

## 2024-08-02 NOTE — Progress Notes (Signed)
   Subjective:    Patient ID: [name], female    DOB: [birth year], 88 y.o.   MRN: [id number]  HPI unable to fully empty bladder, recently treated for uti. Very little urine output for past few days.   follow up CHF  Afib  Vaginal cancer Idopathic pulmonary fibrosis   Discussed the use of AI scribe software for clinical note transcription with the patient, who gave verbal consent to proceed.  History of Present Illness   [name] is an 88 year old female who presents with urinary retention and changes in bowel movements.  She experiences a frequent urge to urinate but only produces a small amount of urine. This issue began after starting a new medication regimen- Cipro , which included a low dose for three days followed by a higher dose twice a day. She does not feel better but mentions some strengthening. No hematuria is noted. She describes a feeling of fullness and discomfort in the bladder area. She has previously experienced severe pain from a catheter insertion.  Regarding bowel movements, she reports a significant episode of melena that occurred unexpectedly while she was in the shower. This episode was alarming and embarrassing. Since then, her stools have been darker than normal but not as severe as the initial episode. The last occurrence of a black bowel movement was on Monday, two days prior to the visit.  She mentions having a port in place, which is sometimes flushed to prevent clotting.       Review of Systems     Objective:   Physical Exam  General-in no acute distress Eyes-no discharge Lungs-respiratory rate normal, CTA CV-no murmurs,RRR Extremities skin warm dry no edema Neuro grossly normal Behavior normal, alert Abdomen soft      Assessment & Plan:  Assessment and Plan    Urinary retention Urinary retention likely due to anatomical changes from pelvic neoplasm. Previous catheterization caused pain due to pressure on growth. Risk of severe  retention requiring emergency intervention. - Order ultrasound of lower pelvis to assess bladder emptying. - Consider referral to urology, gynecology, or gyne-oncology based on ultrasound results. - Prepare for potential emergency room visit for catheterization if severe retention is confirmed.  Malignant neoplasm of pelvic organ Pelvic organ neoplasm with recent shrinkage. No current radiation therapy planned. Port remains for potential future treatments. - Ensure port is flushed during upcoming oncology visit.  Melena Recent episodes of melena with dark stools. No active bleeding stools are returning to normal Hold off on blood thinner for now

## 2024-08-03 ENCOUNTER — Other Ambulatory Visit: Payer: Self-pay

## 2024-08-03 DIAGNOSIS — R339 Retention of urine, unspecified: Secondary | ICD-10-CM

## 2024-08-03 LAB — POCT URINALYSIS DIP (CLINITEK)
Bilirubin, UA: NEGATIVE
Glucose, UA: NEGATIVE mg/dL
Ketones, POC UA: NEGATIVE mg/dL
Nitrite, UA: NEGATIVE
POC PROTEIN,UA: 100 — AB
Spec Grav, UA: 1.02 (ref 1.010–1.025)
Urobilinogen, UA: 0.2 U/dL
pH, UA: 6 (ref 5.0–8.0)

## 2024-08-07 ENCOUNTER — Inpatient Hospital Stay (HOSPITAL_BASED_OUTPATIENT_CLINIC_OR_DEPARTMENT_OTHER): Attending: Gynecologic Oncology | Admitting: Hematology and Oncology

## 2024-08-07 ENCOUNTER — Ambulatory Visit (HOSPITAL_COMMUNITY)
Admission: RE | Admit: 2024-08-07 | Discharge: 2024-08-07 | Disposition: A | Discharge status: Home / Self Care | Source: Ambulatory Visit | Attending: Family Medicine | Admitting: Family Medicine

## 2024-08-07 ENCOUNTER — Inpatient Hospital Stay: Attending: Gynecologic Oncology

## 2024-08-07 ENCOUNTER — Encounter: Payer: Self-pay | Admitting: Hematology and Oncology

## 2024-08-07 VITALS — BP 159/84 | HR 64 | Temp 98.0°F | Resp 18 | Ht 65.0 in | Wt 183.2 lb

## 2024-08-07 DIAGNOSIS — T451X5D Adverse effect of antineoplastic and immunosuppressive drugs, subsequent encounter: Secondary | ICD-10-CM | POA: Diagnosis not present

## 2024-08-07 DIAGNOSIS — R109 Unspecified abdominal pain: Secondary | ICD-10-CM | POA: Insufficient documentation

## 2024-08-07 DIAGNOSIS — K921 Melena: Secondary | ICD-10-CM | POA: Diagnosis not present

## 2024-08-07 DIAGNOSIS — C52 Malignant neoplasm of vagina: Secondary | ICD-10-CM | POA: Diagnosis not present

## 2024-08-07 DIAGNOSIS — T451X5A Adverse effect of antineoplastic and immunosuppressive drugs, initial encounter: Secondary | ICD-10-CM

## 2024-08-07 DIAGNOSIS — D6481 Anemia due to antineoplastic chemotherapy: Secondary | ICD-10-CM | POA: Insufficient documentation

## 2024-08-07 DIAGNOSIS — R339 Retention of urine, unspecified: Secondary | ICD-10-CM | POA: Diagnosis not present

## 2024-08-07 DIAGNOSIS — N3289 Other specified disorders of bladder: Secondary | ICD-10-CM | POA: Diagnosis not present

## 2024-08-07 LAB — CBC WITH DIFFERENTIAL/PLATELET
Abs Immature Granulocytes: 0.03 K/uL (ref 0.00–0.07)
Basophils Absolute: 0 K/uL (ref 0.0–0.1)
Basophils Relative: 0 %
Eosinophils Absolute: 0.1 K/uL (ref 0.0–0.5)
Eosinophils Relative: 1 %
HCT: 29.8 % — ABNORMAL LOW (ref 36.0–46.0)
Hemoglobin: 9.9 g/dL — ABNORMAL LOW (ref 12.0–15.0)
Immature Granulocytes: 0 %
Lymphocytes Relative: 42 %
Lymphs Abs: 3 K/uL (ref 0.7–4.0)
MCH: 33.1 pg (ref 26.0–34.0)
MCHC: 33.2 g/dL (ref 30.0–36.0)
MCV: 99.7 fL (ref 80.0–100.0)
Monocytes Absolute: 0.5 K/uL (ref 0.1–1.0)
Monocytes Relative: 7 %
Neutro Abs: 3.5 K/uL (ref 1.7–7.7)
Neutrophils Relative %: 50 %
Platelets: 225 K/uL (ref 150–400)
RBC: 2.99 MIL/uL — ABNORMAL LOW (ref 3.87–5.11)
RDW: 18 % — ABNORMAL HIGH (ref 11.5–15.5)
Smear Review: NORMAL
WBC: 7 K/uL (ref 4.0–10.5)
nRBC: 0 % (ref 0.0–0.2)

## 2024-08-07 LAB — COMPREHENSIVE METABOLIC PANEL WITH GFR
ALT: 8 U/L (ref 0–44)
AST: 15 U/L (ref 15–41)
Albumin: 3.9 g/dL (ref 3.5–5.0)
Alkaline Phosphatase: 54 U/L (ref 38–126)
Anion gap: 6 (ref 5–15)
BUN: 17 mg/dL (ref 8–23)
CO2: 32 mmol/L (ref 22–32)
Calcium: 9.3 mg/dL (ref 8.9–10.3)
Chloride: 99 mmol/L (ref 98–111)
Creatinine, Ser: 0.95 mg/dL (ref 0.44–1.00)
GFR, Estimated: 58 mL/min — ABNORMAL LOW (ref >60)
Glucose, Bld: 103 mg/dL — ABNORMAL HIGH (ref 70–99)
Potassium: 3.8 mmol/L (ref 3.5–5.1)
Sodium: 137 mmol/L (ref 135–145)
Total Bilirubin: 0.5 mg/dL (ref 0.0–1.2)
Total Protein: 7.2 g/dL (ref 6.5–8.1)

## 2024-08-07 NOTE — Assessment & Plan Note (Addendum)
 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, CARIS: P53 mutation, BRAF negative, MSI stable, low tumor mutation burden 32mut/Mb, HER2/neu score 0, negative, PD-L1 0%  She has received concurrent chemoradiation therapy with cisplatin Her chemotherapy treatment course is complicated by anemia, mild fluid retention and mild vaginal bleeding Recent MRI show improvement of disease control Continue supportive care I plan to see her again in 2 months I plan to repeat imaging study with PET CT scan after completion of radiation treatment

## 2024-08-07 NOTE — Progress Notes (Signed)
  Cancer Center OFFICE PROGRESS NOTE  Patient Care Team: [name], MD as PCP - General (Family Medicine) [name], Jerel, MD as PCP - Cardiology (Cardiology) Shaaron, Lamar HERO, MD as Consulting Physician (Gastroenterology) [facility] Lonn Hicks, MD as Consulting Physician (Hematology and Oncology) Viktoria Comer SAUNDERS, MD as Consulting Physician (Gynecologic Oncology)  Assessment & Plan Vaginal cancer [facility]) 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, CARIS: P53 mutation, BRAF negative, MSI stable, low tumor mutation burden 36mut/Mb, HER2/neu score 0, negative, PD-L1 0%  She has received concurrent chemoradiation therapy with cisplatin Her chemotherapy treatment course is complicated by anemia, mild fluid retention and mild vaginal bleeding Recent MRI show improvement of disease control Continue supportive care I plan to see her again in 2 months I plan to repeat imaging study with PET CT scan after completion of radiation treatment Anemia due to antineoplastic chemotherapy She has persistent anemia Monitor closely  No orders of the defined types were placed in this encounter.    Hicks Lonn, MD  INTERVAL HISTORY: she returns for surveillance follow-up after completion of recent chemotherapy for vaginal cancer She is here by herself Recently, she developed abdominal cramping with passage of dark stool She saw her primary care doctor last week and she is being referred to see gastroenterologist She has no further bleeding or passage of dark stool since She denies abnormal vaginal bleeding or discharge  PHYSICAL EXAMINATION: ECOG PERFORMANCE STATUS: 1 - Symptomatic but completely ambulatory  Vitals:   08/07/24 1035  BP: (!) 159/84  Pulse: 64  Resp: 18   Temp: 98 F (36.7 C)  SpO2: 100%   Filed Weights   08/07/24 1035  Weight: 183 lb 3.2 oz (83.1 kg)    Relevant data reviewed during this visit included CBC and CMP

## 2024-08-07 NOTE — Assessment & Plan Note (Addendum)
 She has persistent anemia Monitor closely

## 2024-08-08 ENCOUNTER — Ambulatory Visit: Payer: Self-pay | Admitting: Family Medicine

## 2024-08-08 ENCOUNTER — Other Ambulatory Visit: Payer: Self-pay

## 2024-08-08 ENCOUNTER — Encounter: Payer: Self-pay | Admitting: Gastroenterology

## 2024-08-08 ENCOUNTER — Telehealth: Payer: Self-pay | Admitting: Family Medicine

## 2024-08-08 ENCOUNTER — Ambulatory Visit (INDEPENDENT_AMBULATORY_CARE_PROVIDER_SITE_OTHER): Admitting: Gastroenterology

## 2024-08-08 VITALS — BP 133/83 | HR 86 | Temp 97.9°F | Ht 65.5 in | Wt 183.8 lb

## 2024-08-08 DIAGNOSIS — K627 Radiation proctitis: Secondary | ICD-10-CM | POA: Diagnosis not present

## 2024-08-08 DIAGNOSIS — R32 Unspecified urinary incontinence: Secondary | ICD-10-CM

## 2024-08-08 DIAGNOSIS — R339 Retention of urine, unspecified: Secondary | ICD-10-CM | POA: Diagnosis not present

## 2024-08-08 DIAGNOSIS — C52 Malignant neoplasm of vagina: Secondary | ICD-10-CM | POA: Diagnosis not present

## 2024-08-08 DIAGNOSIS — D649 Anemia, unspecified: Secondary | ICD-10-CM

## 2024-08-08 DIAGNOSIS — K921 Melena: Secondary | ICD-10-CM | POA: Diagnosis not present

## 2024-08-08 LAB — SPECIMEN STATUS REPORT

## 2024-08-08 LAB — URINE CULTURE

## 2024-08-08 MED ORDER — DOXYCYCLINE HYCLATE 100 MG PO TABS
100.0000 mg | ORAL_TABLET | Freq: Two times a day (BID) | ORAL | 0 refills | Status: AC
Start: 2024-08-08 — End: 2024-08-15

## 2024-08-08 NOTE — Telephone Encounter (Signed)
 Spoke with [name] grand daughter DELAWARE and informed per provider results and recommendations. Prescription has been sent in as indicated, she states she will have pt call to schedule her 3 week follow up appt.

## 2024-08-08 NOTE — Telephone Encounter (Signed)
 Hi Dr.Tucker Patient relates difficulty with urination as well as frequency with urination and also feeling like incomplete emptying of her bladder. Her bladder ultrasound did not show excessive urinary retention.  We have treated her with antibiotics  Given her symptoms would you recommend that we consult urology?  Or should she do a follow-up with your group?  I appreciate the guidance you are give. Thanks-Hiren Peplinski family medicine

## 2024-08-08 NOTE — Patient Instructions (Addendum)
 Given you have only had 2 occurrences of black stool and none recently and you are about to resume radiation I would like for you to increase your pantoprazole  to twice daily, 30 minutes prior to breakfast and dinner.  I also want you to resume your Eliquis  and if you have recurrence of these black stools to call me and let me know.  We will plan for repeat labs to check your hemoglobin levels and iron levels in 2 weeks.  If black stools return and already have a drop in your hemoglobin then I would recommend endoscopic evaluation.  It was a pleasure to see you today. I want to create trusting relationships with patients. If you receive a survey regarding your visit,  I greatly appreciate you taking time to fill this out on paper or through your MyChart. I value your feedback.  Charmaine Melia, MSN, FNP-BC, AGACNP-BC [facility]

## 2024-08-08 NOTE — Telephone Encounter (Signed)
 Nurses-I did try calling the patient and did not get a answer  Her ultrasound unfortunately did not evidence of urinary obstruction but the test was somewhat limited due to bowel gas  Her urine culture from just a few days ago came back showing bacteria resistant to Cipro  I would recommend doxycycline  100 mg 1 twice daily for 7 days Take with a snack and a tall glass of liquids to minimize the chance of stomach upset  I would recommend that our staff set her up for a follow-up visit in 3 weeks either with myself, Elveria, or Charmaine to recheck her urine and see how her urinary symptoms are doing For now do not need to be referred to urology

## 2024-08-08 NOTE — Progress Notes (Signed)
 GI Office Note    Referring Provider: [name], MD Primary Care Physician:  [name], MD  Primary Gastroenterologist: Lamar HERO.Rourk, MD  Chief Complaint   Chief Complaint  Patient presents with   GI Bleeding   History of Present Illness   [name] is a 88 y.o. female presenting today at the request of Luking, Glendia LABOR, MD for evaluation of GI bleed.  Colonoscopy 2015: Sessile polyp in the cecum, remainder of colon normal.  Last seen in the office June 2017 by Dr. Shaaron.  She presented for follow-up of her reflux.  Noted history of colonic adenomas removed.  She was doing well on pantoprazole  40 mg every other day and having trouble with her weight.  She was trying to obtain a more healthy BMI and she started walking with her husband every day.  Denied any melena or rectal bleeding.  No abdominal pain dysphagia or odynophagia.  She is advised continue pantoprazole  40 mg daily to every other day and office visit 1 year or as needed.  Reflux information provided.  During this visit it was noted that she had a history of heme positive stool a year prior with recent EGD and colonoscopy up-to-date.  Per review of her chart she has recently been undergoing radiation for vaginal cancer.  Last session 07/05/2024.  See PCP 07/26/2024 noting a single bout of melena on 8/25 without abdominal pain.  Taking her Protonix  and holding her Eliquis .  Overall feeling well.  No additional bowel movement since the 1 episode of melena.  Cystitis symptoms improving on Cipro .  iFOBT + 07/26/2024.  PCP visit 08/02/2024 again noting frequent urination with small amounts.  Having fullness and discomfort in the bladder area.  In regards to bowel movements she had a significant could have melena that occurred unexpectedly in the shower which was alarming embarrassing as a send stools have been darker than normal but not as severe as the prior episode.  Advised to continue holding blood thinner.      Latest Ref Rng & Units 08/07/2024   10:07 AM 07/24/2024    4:59 PM 07/14/2024    2:36 PM 06/30/2024   11:18 AM 06/16/2024   10:28 AM  CBC  WBC 4.0 - 10.5 K/uL 7.0  8.9  8.4  3.7  4.3   Hemoglobin 12.0 - 15.0 g/dL 9.9  89.2  89.4  8.9  8.8   Hematocrit 36.0 - 46.0 % 29.8  31.9  30.9  27.4  26.7   Platelets 150 - 400 K/uL 225  185  211  341  132     Today:  Discussed the use of AI scribe software for clinical note transcription with the patient, who gave verbal consent to proceed.  She experienced two episodes of melena. The first episode occurred unexpectedly while she was in the shower, resulting in a significant mess. The stool was described as 'solid black' and ran down her leg without any associated pain. The second episode happened the following day, with a small amount of black stool. Since then, her bowel movements have returned to normal in color and consistency, being dark brown but not black. No abdominal pain. No change in appetite, chest pain, or shortness of breath.  On the same day as the first melena episode, she experienced a side ache, but no pain was noted during the episodes themselves. She has a hernia on the right side, which usually does not bother her. She was on  Eliquis  but She is unsure why it was stopped but notes that it was around the time of the first melena episode.  She has recently completed a course of radiation therapy and is about to start another round. She has confusion and difficulty remembering things, such as her own phone number, which she attributes to the stress of her medical treatments. No nausea or vomiting, and her bowel movements have been loose but not painful since the melena episodes.  She reports soreness in her perineal area, describing it as feeling 'just like I've had a baby.' She has been using a numbing cream for relief. She also mentions having undergone a pelvic ultrasound and MRI recently, which required her to drink a large amount of water ,  leading to discomfort.  She has a history of low hemoglobin levels, which were noted to be in the eight range in July but have since improved slightly. She is not currently taking any iron supplements.       Wt Readings from Last 5 Encounters:  08/08/24 183 lb 12.8 oz (83.4 kg)  08/07/24 183 lb 3.2 oz (83.1 kg)  08/02/24 183 lb (83 kg)  07/27/24 185 lb (83.9 kg)  07/26/24 185 lb (83.9 kg)    Current Outpatient Medications  Medication Sig Dispense Refill   acetaminophen  (TYLENOL ) 650 MG CR tablet Take 650 mg by mouth every 8 (eight) hours as needed for pain.     albuterol  (VENTOLIN  HFA) 108 (90 Base) MCG/ACT inhaler INHALE 2 PUFFS INTO THE LUNGS EVERY 6 HOURS AS NEEDED FOR WHEEZING OR SHORTNESS OF BREATH 18 g 1   alendronate  (FOSAMAX ) 70 MG tablet TAKE 1 TABLET(70 MG) BY MOUTH EVERY 7 DAYS WITH A FULL GLASS OF WATER  AND ON AN EMPTY STOMACH 4 tablet 3   ALPRAZolam  (XANAX ) 0.5 MG tablet TAKE 1 TABLET BY MOUTH EVERY NIGHT AT BEDTIME. MAY TAKE 1/2 TABLET DURING THE DAY AS NEEDED 45 tablet 4   Cholecalciferol (VITAMIN D3) 2000 UNITS TABS Take 2,000 Units by mouth daily.     ciprofloxacin  (CIPRO ) 500 MG tablet Take 1 tablet (500 mg total) by mouth 2 (two) times daily. 14 tablet 0   diphenoxylate -atropine  (LOMOTIL ) 2.5-0.025 MG tablet Take 1 tablet by mouth 4 (four) times daily as needed for diarrhea or loose stools. Not to exceed 8 tablets per day 30 tablet 0   ELIQUIS  5 MG TABS tablet TAKE 1 TABLET(5 MG) BY MOUTH TWICE DAILY. FOLLOW-UP 180 tablet 1   indapamide  (LOZOL ) 1.25 MG tablet Take 1 tablet (1.25 mg total) by mouth every morning. 90 tablet 0   levothyroxine  (SYNTHROID ) 125 MCG tablet TAKE 1 TABLET BY MOUTH DAILY 90 tablet 1   magnesium gluconate (MAGONATE) 500 (27 Mg) MG TABS tablet Take 500 mg by mouth daily at 6 (six) AM.     metoprolol  succinate (TOPROL -XL) 25 MG 24 hr tablet Take 1 tablet (25 mg total) by mouth daily. 90 tablet 1   Multiple Vitamin (MULTI-VITAMIN DAILY PO) Take 1  tablet by mouth daily.     Nintedanib (OFEV ) 100 MG CAPS Take 1 capsule (100 mg total) by mouth 2 (two) times daily. **dose change** 180 capsule 1   ondansetron  (ZOFRAN -ODT) 4 MG disintegrating tablet Take 1 tablet (4 mg total) by mouth every 8 (eight) hours as needed. 20 tablet 0   pantoprazole  (PROTONIX ) 40 MG tablet TAKE 1 TABLET(40 MG) BY MOUTH DAILY 90 tablet 1   potassium chloride  (KLOR-CON  M) 10 MEQ tablet TAKE 1 TABLET(10 MEQ) BY  MOUTH DAILY 90 tablet 1   sertraline  (ZOLOFT ) 100 MG tablet TAKE 1 TABLET BY MOUTH EVERY DAY 90 tablet 1   No current facility-administered medications for this visit.    Past Medical History:  Diagnosis Date   Arthritis    CHF (congestive heart failure) (HCC)    Chronic Leukemia    Depression    Dysrhythmia    A.fib   on Eliquis    H/O hiatal hernia    Hernia    Hernia    right inguinal hernia   Hypertension    Hypothyroidism    Leg swelling    Osteitis pubis (HCC) 05/30/2001   Osteopenia 03/30/2009   Osteopenia 07/30/2022   Bone density 2021 showed elevated FRAX patient was started on Fosamax    Pneumonia    Thyroid  disease     Past Surgical History:  Procedure Laterality Date   ABDOMINAL HYSTERECTOMY  03/1970   APPENDECTOMY  03/1966   BILATERAL SALPINGOOPHORECTOMY     CARDIOVERSION N/A 03/10/2022   Procedure: CARDIOVERSION;  Surgeon: Francyne Headland, MD;  Location: [facility];  Service: Cardiovascular;  Laterality: N/A;   CATARACT EXTRACTION W/PHACO Right 09/25/2014   Procedure: CATARACT EXTRACTION PHACO AND INTRAOCULAR LENS PLACEMENT (IOC);  Surgeon: Oneil T. Roz, MD;  Location: [facility];  Service: Ophthalmology;  Laterality: Right;  CDE:7.56   CATARACT EXTRACTION W/PHACO Left 10/09/2014   Procedure: CATARACT EXTRACTION PHACO AND INTRAOCULAR LENS PLACEMENT LEFT EYE CDE=6.51;  Surgeon: Oneil T. Roz, MD;  Location: [facility];  Service: Ophthalmology;  Laterality: Left;   CHOLECYSTECTOMY  1980   COLONOSCOPY  1999   COLONOSCOPY  March 2007    Repeat 10 years   COLONOSCOPY  11/06/2003    Internal and external hemorrhoids/Two tiny rectal distal sigmoid polyps cold biopsied/Otherwise within normal limits to the cecum   COLONOSCOPY N/A 08/29/2014   Dr. Shaaron: Sessile polyp was found at the cecum status post cold snare polypectomy, tubular adenoma. No further surveillance unless clinical status changes   CYSTOSCOPY N/A 05/24/2024   Procedure: CYSTOSCOPY;  Surgeon: Viktoria Comer SAUNDERS, MD;  Location: [facility];  Service: Gynecology;  Laterality: N/A;   ESOPHAGOGASTRODUODENOSCOPY  2007   Dr. Shaaron: normal esophagus, small tom oderate sized hiatal hernia, Cameron lesions   EXAM UNDER ANESTHESIA, PELVIC N/A 05/24/2024   Procedure: EXAM UNDER ANESTHESIA, PELVIC;  Surgeon: Viktoria Comer SAUNDERS, MD;  Location: [facility];  Service: Gynecology;  Laterality: N/A;   IR IMAGING GUIDED PORT INSERTION  05/23/2024   SKIN CANCER EXCISION Left 11 1 2016   left arm squamous cancer   TUMOR REMOVAL  03/1968   ovarian    Family History  Problem Relation Age of Onset   Dementia Mother    Heart disease Father    Cancer Father        throat   Cancer Sister        breast   Breast cancer Sister    Lung cancer Grandson    Colon cancer Neg Hx    Lung disease Neg Hx    Autoimmune disease Neg Hx     Allergies as of 08/08/2024 - Review Complete 08/08/2024  Allergen Reaction Noted   Erythromycin Shortness Of Breath 04/15/2012   Aspirin Other (See Comments) 04/15/2012   Ibuprofen  04/15/2012    Social History   Socioeconomic History   Marital status: Widowed    Spouse name: Not on file   Number of children: 1   Years of education: 12   Highest education level:  Not on file  Occupational History   Occupation: Retired    Associate Professor: RETIRED    Comment: [employer], retired 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   Widowed since 27-Jul-2021. Husband W.E. Bucker, died from Kidney Cancer.   Son-Tommy lives nearby.   2 grandson's deceased, Clint and Penne. Penne passed 02/2022 from Brain Cancer.    Social Drivers of Corporate investment banker Strain: Low Risk  (07/07/2024)   Overall Financial Resource Strain (CARDIA)    Difficulty of Paying Living Expenses: Not hard at all  Food Insecurity: No Food Insecurity (07/07/2024)   Hunger Vital Sign    Worried About Running Out of Food in the Last Year: Never true    Ran Out of Food in the Last Year: Never true  Transportation Needs: No Transportation Needs (07/07/2024)   PRAPARE - Administrator, Civil Service (Medical): No    Lack of Transportation (Non-Medical): No  Physical Activity: Inactive (07/07/2024)   Exercise Vital Sign    Days of Exercise per Week: 0 days    Minutes of Exercise per Session: Not on file  Stress: No Stress Concern Present (07/07/2024)   [employer] of Occupational Health - Occupational Stress Questionnaire    Feeling of Stress: Only a little  Social Connections: Moderately Integrated (07/07/2024)   Social Connection and Isolation Panel    Frequency of Communication with Friends and Family: More than three times a week    Frequency of Social Gatherings with Friends and Family: Three times a week    Attends Religious Services: More than 4 times per year    Active Member of Clubs or Organizations: Yes    Attends Banker Meetings: More than 4 times per year    Marital Status: Widowed  Intimate Partner Violence: Not At Risk (07/07/2024)   Humiliation, Afraid, Rape, and Kick questionnaire    Fear of Current or Ex-Partner: No    Emotionally  Abused: No    Physically Abused: No    Sexually Abused: No     Review of Systems   Gen: Denies any fever, chills, fatigue, weight loss, lack of appetite.  CV: Denies chest pain, heart palpitations, peripheral edema, syncope.  Resp: Denies shortness of breath at rest or with exertion. Denies wheezing or cough.  GI: see HPI GU : Denies urinary burning, urinary frequency, urinary hesitancy MS: Denies joint pain, muscle weakness, cramps, or limitation of movement.  Derm: Denies rash, itching, dry skin Psych: Denies depression, anxiety, memory loss, and confusion Heme: Denies bruising, bleeding, and enlarged lymph nodes.  Physical Exam   BP 133/83 (BP Location: Right Arm, Patient Position: Sitting, Cuff Size: Large)   Pulse 86   Temp 97.9 F (36.6 C) (Oral)   Ht 5' 5.5 (1.664 m)   Wt 183 lb 12.8 oz (83.4 kg)   SpO2 93%   BMI 30.12 kg/m   General:   Alert and oriented. Pleasant and cooperative. Well-nourished and well-developed.  Head:  Normocephalic and atraumatic. Eyes:  Without icterus, sclera clear and conjunctiva pink.  Ears:  Normal auditory acuity. Mouth:  No deformity or lesions, oral mucosa pink.  Abdomen:  +BS, soft, non-tender and non-distended. No HSM noted. No guarding or rebound. No masses appreciated.  Rectal:  deferred  Msk:  Symmetrical without gross deformities. Normal posture. Neurologic:  Alert and  oriented x4;  grossly normal neurologically. Skin:  Intact without significant lesions or rashes. Psych:  Alert and cooperative. Normal mood and affect.  Assessment & Plan   [name] is a 88 y.o. female with a history of vaginal cancer, depression, pulmonary fibrosis, osteopenia, hypertension, hypothyroidism, leukemia, CHF, A-fib on Eliquis  (recently on hold) presenting today for evaluation of melena.     Melena and radiation proctitis Radiation proctitis with rectal bleeding, presenting as melena. Recent episodes of black stool likely due to  radiation-induced mucosal edema and inflammation around the rectum. Differential includes radiation proctitis and potential upper GI bleeding. Symptoms include soreness and discomfort in the vaginal area, likely exacerbated by recent radiation and examination. No significant upper abdominal pain reported. No brbpr. Given her cancer treatment, the tissue may take longer to heal, contributing to discomfort. - Increase pantoprazole  to twice daily empirically given melena - Restart Eliquis  and monitor for recurrent melena.  - Recheck labs in two weeks to assess hemoglobin levels and iron levels.  - Consider endoscopic evaluation if black stool recurs or if there is a significant drop in hemoglobin levels.  Vaginal cancer, actively treated Vaginal cancer currently undergoing active treatment with radiation. Recent completion of chemotherapy. Radiation therapy is scheduled to resume with five more sessions starting August 09, 2024. Previous radiation has caused some inflammation and soreness in the pelvic area, contributing to discomfort. She has declined a procedure at [facility] due to personal reasons and logistical challenges, opting for alternative treatments at [facility]. - Continue with scheduled radiation therapy sessions. - Monitor for any exacerbation of symptoms related to radiation treatment.  Anemia, unspecified Anemia with hemoglobin levels previously in the 8 range, now slightly improved up to 10 and back to 9.9. Anemia likely multifactorial, possibly related to recent radiation and chemotherapy treatments and her vaginal cancer. Unable to rule out GI cause given radiation proctitis. Although melena typically is from upper GI source unable to rule out right sided colonic source. Last colonoscopy in 2015.  - Monitor hemoglobin levels with upcoming lab work in two weeks. - Consider iron supplementation if anemia does not improve. - May consider endoscopic evaluation if further drop in Hgb or if  melena returns.   Chronic urinary retention Chronic urinary retention with incomplete bladder emptying. Recent pelvic ultrasound showed residual urine in the bladder, consistent with chronic retention. Advised ongoing follow up with oncology and GYN.      Follow up   Labs in 2 weeks.  Follow up in office in 4 weeks.   Charmaine Melia, MSN, FNP-BC, AGACNP-BC [facility]

## 2024-08-09 ENCOUNTER — Ambulatory Visit
Admission: RE | Admit: 2024-08-09 | Discharge: 2024-08-09 | Disposition: A | Discharge status: Home / Self Care | Source: Ambulatory Visit | Attending: Radiation Oncology | Admitting: Radiation Oncology

## 2024-08-09 VITALS — BP 162/78 | HR 58 | Temp 97.5°F | Resp 20 | Ht 65.5 in | Wt 184.2 lb

## 2024-08-09 DIAGNOSIS — Z510 Encounter for antineoplastic radiation therapy: Secondary | ICD-10-CM | POA: Insufficient documentation

## 2024-08-09 DIAGNOSIS — C52 Malignant neoplasm of vagina: Secondary | ICD-10-CM

## 2024-08-09 MED ORDER — SODIUM CHLORIDE 0.9% FLUSH
10.0000 mL | Freq: Once | INTRAVENOUS | Status: AC
  Administered 2024-08-09: 10 mL via INTRAVENOUS

## 2024-08-09 NOTE — Progress Notes (Signed)
 Has armband been applied?  No.  Does patient have an allergy to IV contrast dye?: No.   Has patient ever received premedication for IV contrast dye?: No.   Does patient take metformin?: No.  If patient does take metformin when was the last dose: N/A  Date of lab work: August 08, 2024 BUN: 17 CR: 0.95 eGfr: 58  IV site: Porta cath  Has IV site been added to flowsheet?  Yes.     BP (!) 174/81 (BP Location: Right Arm, Patient Position: Sitting, Cuff Size: Large)   Pulse 66   Temp (!) 97.5 F (36.4 C)   Resp 20   Ht 5' 5.5 (1.664 m)   Wt 184 lb 3.2 oz (83.6 kg)   SpO2 98%   BMI 30.19 kg/m

## 2024-08-10 ENCOUNTER — Other Ambulatory Visit: Payer: Self-pay

## 2024-08-10 DIAGNOSIS — R339 Retention of urine, unspecified: Secondary | ICD-10-CM

## 2024-08-10 NOTE — Telephone Encounter (Signed)
 Did call pt and informed her per drs notes and recommendations, urology referral placed.

## 2024-08-10 NOTE — Telephone Encounter (Signed)
 Nurses-please let family know I communicated with her gynecologic oncology surgeon.  Given her symptoms she felt it is best that we get a consultation with urology please set up local urology Dr. Sherrilee Difficulty urination is the referral diagnosis

## 2024-08-15 DIAGNOSIS — C52 Malignant neoplasm of vagina: Secondary | ICD-10-CM | POA: Diagnosis not present

## 2024-08-15 DIAGNOSIS — Z510 Encounter for antineoplastic radiation therapy: Secondary | ICD-10-CM | POA: Diagnosis not present

## 2024-08-21 ENCOUNTER — Ambulatory Visit
Admission: RE | Admit: 2024-08-21 | Discharge: 2024-08-21 | Disposition: A | Discharge status: Home / Self Care | Source: Ambulatory Visit | Attending: Radiation Oncology | Admitting: Radiation Oncology

## 2024-08-21 ENCOUNTER — Other Ambulatory Visit: Payer: Self-pay

## 2024-08-21 DIAGNOSIS — Z510 Encounter for antineoplastic radiation therapy: Secondary | ICD-10-CM | POA: Diagnosis not present

## 2024-08-21 DIAGNOSIS — C52 Malignant neoplasm of vagina: Secondary | ICD-10-CM | POA: Diagnosis not present

## 2024-08-21 LAB — RAD ONC ARIA SESSION SUMMARY
Course Elapsed Days: 0
Plan Fractions Treated to Date: 1
Plan Prescribed Dose Per Fraction: 2 Gy
Plan Total Fractions Prescribed: 5
Plan Total Prescribed Dose: 10 Gy
Reference Point Dosage Given to Date: 2 Gy
Reference Point Session Dosage Given: 2 Gy
Session Number: 1

## 2024-08-22 ENCOUNTER — Other Ambulatory Visit: Payer: Self-pay

## 2024-08-22 ENCOUNTER — Ambulatory Visit
Admission: RE | Admit: 2024-08-22 | Discharge: 2024-08-22 | Disposition: A | Discharge status: Home / Self Care | Source: Ambulatory Visit | Attending: Radiation Oncology | Admitting: Radiation Oncology

## 2024-08-22 ENCOUNTER — Ambulatory Visit

## 2024-08-22 DIAGNOSIS — C52 Malignant neoplasm of vagina: Secondary | ICD-10-CM

## 2024-08-22 DIAGNOSIS — Z510 Encounter for antineoplastic radiation therapy: Secondary | ICD-10-CM | POA: Diagnosis not present

## 2024-08-22 LAB — RAD ONC ARIA SESSION SUMMARY
Course Elapsed Days: 1
Plan Fractions Treated to Date: 2
Plan Prescribed Dose Per Fraction: 2 Gy
Plan Total Fractions Prescribed: 5
Plan Total Prescribed Dose: 10 Gy
Reference Point Dosage Given to Date: 4 Gy
Reference Point Session Dosage Given: 2 Gy
Session Number: 2

## 2024-08-22 LAB — BASIC METABOLIC PANEL - CANCER CENTER ONLY
Anion gap: 7 (ref 5–15)
BUN: 23 mg/dL (ref 8–23)
CO2: 32 mmol/L (ref 22–32)
Calcium: 9.4 mg/dL (ref 8.9–10.3)
Chloride: 99 mmol/L (ref 98–111)
Creatinine: 1.01 mg/dL — ABNORMAL HIGH (ref 0.44–1.00)
GFR, Estimated: 54 mL/min — ABNORMAL LOW (ref >60)
Glucose, Bld: 110 mg/dL — ABNORMAL HIGH (ref 70–99)
Potassium: 3.5 mmol/L (ref 3.5–5.1)
Sodium: 138 mmol/L (ref 135–145)

## 2024-08-23 ENCOUNTER — Other Ambulatory Visit: Payer: Self-pay

## 2024-08-23 ENCOUNTER — Ambulatory Visit
Admission: RE | Admit: 2024-08-23 | Discharge: 2024-08-23 | Disposition: A | Discharge status: Home / Self Care | Source: Ambulatory Visit | Attending: Radiation Oncology | Admitting: Radiation Oncology

## 2024-08-23 DIAGNOSIS — C52 Malignant neoplasm of vagina: Secondary | ICD-10-CM | POA: Diagnosis not present

## 2024-08-23 DIAGNOSIS — Z510 Encounter for antineoplastic radiation therapy: Secondary | ICD-10-CM | POA: Diagnosis not present

## 2024-08-23 LAB — RAD ONC ARIA SESSION SUMMARY
Course Elapsed Days: 2
Plan Fractions Treated to Date: 3
Plan Prescribed Dose Per Fraction: 2 Gy
Plan Total Fractions Prescribed: 5
Plan Total Prescribed Dose: 10 Gy
Reference Point Dosage Given to Date: 6 Gy
Reference Point Session Dosage Given: 2 Gy
Session Number: 3

## 2024-08-24 ENCOUNTER — Other Ambulatory Visit: Payer: Self-pay

## 2024-08-24 ENCOUNTER — Ambulatory Visit
Admission: RE | Admit: 2024-08-24 | Discharge: 2024-08-24 | Disposition: A | Discharge status: Home / Self Care | Source: Ambulatory Visit | Attending: Radiation Oncology | Admitting: Radiation Oncology

## 2024-08-24 ENCOUNTER — Other Ambulatory Visit: Payer: Self-pay | Admitting: Family Medicine

## 2024-08-24 DIAGNOSIS — C52 Malignant neoplasm of vagina: Secondary | ICD-10-CM | POA: Diagnosis not present

## 2024-08-24 DIAGNOSIS — Z510 Encounter for antineoplastic radiation therapy: Secondary | ICD-10-CM | POA: Diagnosis not present

## 2024-08-24 LAB — RAD ONC ARIA SESSION SUMMARY
Course Elapsed Days: 3
Plan Fractions Treated to Date: 4
Plan Prescribed Dose Per Fraction: 2 Gy
Plan Total Fractions Prescribed: 5
Plan Total Prescribed Dose: 10 Gy
Reference Point Dosage Given to Date: 8 Gy
Reference Point Session Dosage Given: 2 Gy
Session Number: 4

## 2024-08-25 ENCOUNTER — Other Ambulatory Visit: Payer: Self-pay

## 2024-08-25 ENCOUNTER — Ambulatory Visit
Admission: RE | Admit: 2024-08-25 | Discharge: 2024-08-25 | Disposition: A | Discharge status: Home / Self Care | Source: Ambulatory Visit | Attending: Radiation Oncology | Admitting: Radiation Oncology

## 2024-08-25 ENCOUNTER — Ambulatory Visit

## 2024-08-25 ENCOUNTER — Telehealth: Payer: Self-pay

## 2024-08-25 DIAGNOSIS — Z510 Encounter for antineoplastic radiation therapy: Secondary | ICD-10-CM | POA: Diagnosis not present

## 2024-08-25 DIAGNOSIS — R300 Dysuria: Secondary | ICD-10-CM

## 2024-08-25 DIAGNOSIS — C52 Malignant neoplasm of vagina: Secondary | ICD-10-CM | POA: Diagnosis not present

## 2024-08-25 LAB — RAD ONC ARIA SESSION SUMMARY
Course Elapsed Days: 4
Plan Fractions Treated to Date: 5
Plan Prescribed Dose Per Fraction: 2 Gy
Plan Total Fractions Prescribed: 5
Plan Total Prescribed Dose: 10 Gy
Reference Point Dosage Given to Date: 10 Gy
Reference Point Session Dosage Given: 2 Gy
Session Number: 5

## 2024-08-25 LAB — URINALYSIS, COMPLETE (UACMP) WITH MICROSCOPIC
Bilirubin Urine: NEGATIVE
Glucose, UA: NEGATIVE mg/dL
Ketones, ur: NEGATIVE mg/dL
Nitrite: NEGATIVE
Protein, ur: 30 mg/dL — AB
RBC / HPF: >50 RBC/hpf (ref 0–5)
Specific Gravity, Urine: 1.008 (ref 1.005–1.030)
pH: 6 (ref 5.0–8.0)

## 2024-08-25 NOTE — Telephone Encounter (Signed)
 Prescription called in to [facility] for Pyridium 200mg - take 1 tab 3 times per day as needed for pain # 30 no refills. Per Dr. Lynwood Nasuti. Spoke with Norleen lan.

## 2024-08-25 NOTE — Progress Notes (Signed)
 Patient continues to report difficulty with voiding. Patient states she only dribbles. Per Dr. Shannon perform In and out cath and collect UA c&S. of urine collected. Patient reports great relief. Will call patient with UA results.

## 2024-08-26 LAB — URINE CULTURE: Culture: NO GROWTH

## 2024-08-28 ENCOUNTER — Ambulatory Visit: Payer: Self-pay

## 2024-08-28 ENCOUNTER — Emergency Department (HOSPITAL_COMMUNITY)

## 2024-08-28 ENCOUNTER — Other Ambulatory Visit: Payer: Self-pay

## 2024-08-28 ENCOUNTER — Encounter (HOSPITAL_COMMUNITY): Payer: Self-pay

## 2024-08-28 ENCOUNTER — Emergency Department (HOSPITAL_COMMUNITY)
Admission: EM | Admit: 2024-08-28 | Discharge: 2024-08-28 | Disposition: A | Discharge status: Home / Self Care | Source: Ambulatory Visit | Attending: Emergency Medicine | Admitting: Emergency Medicine

## 2024-08-28 DIAGNOSIS — Z7901 Long term (current) use of anticoagulants: Secondary | ICD-10-CM | POA: Diagnosis not present

## 2024-08-28 DIAGNOSIS — R339 Retention of urine, unspecified: Secondary | ICD-10-CM | POA: Diagnosis not present

## 2024-08-28 DIAGNOSIS — K449 Diaphragmatic hernia without obstruction or gangrene: Secondary | ICD-10-CM | POA: Diagnosis not present

## 2024-08-28 DIAGNOSIS — Z79899 Other long term (current) drug therapy: Secondary | ICD-10-CM | POA: Insufficient documentation

## 2024-08-28 DIAGNOSIS — E876 Hypokalemia: Secondary | ICD-10-CM

## 2024-08-28 DIAGNOSIS — K4090 Unilateral inguinal hernia, without obstruction or gangrene, not specified as recurrent: Secondary | ICD-10-CM | POA: Diagnosis not present

## 2024-08-28 DIAGNOSIS — R34 Anuria and oliguria: Secondary | ICD-10-CM | POA: Insufficient documentation

## 2024-08-28 DIAGNOSIS — R195 Other fecal abnormalities: Secondary | ICD-10-CM | POA: Diagnosis not present

## 2024-08-28 DIAGNOSIS — R109 Unspecified abdominal pain: Secondary | ICD-10-CM | POA: Diagnosis not present

## 2024-08-28 DIAGNOSIS — C52 Malignant neoplasm of vagina: Secondary | ICD-10-CM | POA: Diagnosis not present

## 2024-08-28 LAB — URINALYSIS, W/ REFLEX TO CULTURE (INFECTION SUSPECTED)
Bilirubin Urine: NEGATIVE
Glucose, UA: NEGATIVE mg/dL
Hgb urine dipstick: NEGATIVE
Ketones, ur: NEGATIVE mg/dL
Leukocytes,Ua: NEGATIVE
Nitrite: NEGATIVE
Protein, ur: 100 mg/dL — AB
Specific Gravity, Urine: 1.03 (ref 1.005–1.030)
pH: 5 (ref 5.0–8.0)

## 2024-08-28 LAB — CBC WITH DIFFERENTIAL/PLATELET
Abs Immature Granulocytes: 0.07 K/uL (ref 0.00–0.07)
Basophils Absolute: 0 K/uL (ref 0.0–0.1)
Basophils Relative: 0 %
Eosinophils Absolute: 0.3 K/uL (ref 0.0–0.5)
Eosinophils Relative: 3 %
HCT: 31.1 % — ABNORMAL LOW (ref 36.0–46.0)
Hemoglobin: 10.1 g/dL — ABNORMAL LOW (ref 12.0–15.0)
Immature Granulocytes: 1 %
Lymphocytes Relative: 26 %
Lymphs Abs: 2.7 K/uL (ref 0.7–4.0)
MCH: 33.3 pg (ref 26.0–34.0)
MCHC: 32.5 g/dL (ref 30.0–36.0)
MCV: 102.6 fL — ABNORMAL HIGH (ref 80.0–100.0)
Monocytes Absolute: 0.8 K/uL (ref 0.1–1.0)
Monocytes Relative: 7 %
Neutro Abs: 6.7 K/uL (ref 1.7–7.7)
Neutrophils Relative %: 63 %
Platelets: 340 K/uL (ref 150–400)
RBC: 3.03 MIL/uL — ABNORMAL LOW (ref 3.87–5.11)
RDW: 15.7 % — ABNORMAL HIGH (ref 11.5–15.5)
WBC: 10.5 K/uL (ref 4.0–10.5)
nRBC: 0 % (ref 0.0–0.2)

## 2024-08-28 LAB — MAGNESIUM: Magnesium: 1.4 mg/dL — ABNORMAL LOW (ref 1.7–2.4)

## 2024-08-28 LAB — COMPREHENSIVE METABOLIC PANEL WITH GFR
ALT: 11 U/L (ref 0–44)
AST: 18 U/L (ref 15–41)
Albumin: 3.2 g/dL — ABNORMAL LOW (ref 3.5–5.0)
Alkaline Phosphatase: 62 U/L (ref 38–126)
Anion gap: 15 (ref 5–15)
BUN: 18 mg/dL (ref 8–23)
CO2: 29 mmol/L (ref 22–32)
Calcium: 8.5 mg/dL — ABNORMAL LOW (ref 8.9–10.3)
Chloride: 93 mmol/L — ABNORMAL LOW (ref 98–111)
Creatinine, Ser: 1.02 mg/dL — ABNORMAL HIGH (ref 0.44–1.00)
GFR, Estimated: 54 mL/min — ABNORMAL LOW (ref >60)
Glucose, Bld: 86 mg/dL (ref 70–99)
Potassium: 2.8 mmol/L — ABNORMAL LOW (ref 3.5–5.1)
Sodium: 137 mmol/L (ref 135–145)
Total Bilirubin: 0.7 mg/dL (ref 0.0–1.2)
Total Protein: 6.9 g/dL (ref 6.5–8.1)

## 2024-08-28 MED ORDER — MAGNESIUM SULFATE 2 GM/50ML IV SOLN
2.0000 g | Freq: Once | INTRAVENOUS | Status: AC
  Administered 2024-08-28: 2 g via INTRAVENOUS
  Filled 2024-08-28: qty 50

## 2024-08-28 MED ORDER — POTASSIUM CHLORIDE 10 MEQ/100ML IV SOLN
10.0000 meq | Freq: Once | INTRAVENOUS | Status: AC
  Administered 2024-08-28: 10 meq via INTRAVENOUS
  Filled 2024-08-28: qty 100

## 2024-08-28 MED ORDER — POTASSIUM CHLORIDE CRYS ER 20 MEQ PO TBCR
40.0000 meq | EXTENDED_RELEASE_TABLET | Freq: Once | ORAL | Status: AC
Start: 2024-08-28 — End: 2024-08-28
  Administered 2024-08-28: 40 meq via ORAL
  Filled 2024-08-28: qty 2

## 2024-08-28 MED ORDER — SODIUM CHLORIDE 0.9 % IV BOLUS
500.0000 mL | Freq: Once | INTRAVENOUS | Status: AC
  Administered 2024-08-28: 500 mL via INTRAVENOUS

## 2024-08-28 MED ORDER — POTASSIUM CHLORIDE CRYS ER 20 MEQ PO TBCR
40.0000 meq | EXTENDED_RELEASE_TABLET | Freq: Once | ORAL | Status: AC
  Administered 2024-08-28: 40 meq via ORAL
  Filled 2024-08-28: qty 2

## 2024-08-28 MED ORDER — LACTATED RINGERS IV BOLUS
1000.0000 mL | Freq: Once | INTRAVENOUS | Status: AC
  Administered 2024-08-28: 1000 mL via INTRAVENOUS

## 2024-08-28 MED ORDER — MAGNESIUM GLUCONATE 500 (27 MG) MG PO TABS: 500.0000 mg | ORAL_TABLET | Freq: Every day | ORAL | 2 refills | Status: DC

## 2024-08-28 MED ORDER — POTASSIUM CHLORIDE CRYS ER 20 MEQ PO TBCR: EXTENDED_RELEASE_TABLET | ORAL | 2 refills | Status: DC

## 2024-08-28 NOTE — ED Notes (Signed)
 Unable to see anything on bladder scanner, RN will try.

## 2024-08-28 NOTE — ED Triage Notes (Signed)
 Pt arrived via POV c/o oliguria X 10 days. Pt endorses abdominal pain and discomfort, and reports requiring an In and Out Cath this past Friday. Pt reports symptoms began before she finished her radiation therapy.

## 2024-08-28 NOTE — Telephone Encounter (Signed)
 FYI Only or Action Required?: FYI only for provider.  Patient was last seen in primary care on 08/02/2024 by [name], MD.  Called Nurse Triage reporting Urinary Retention, Diarrhea, and Abdominal Pain.  Symptoms began 10 days ago for urinary symptoms, diarrhea x today.  Interventions attempted: Other: straight catheter last week during her cancer treatment.  Symptoms are: dark, almost black diarrhea with incontinence this morning; rectal pain, severe abdominal pressure, urinary retention (last urinated about a table spoon yesterday)gradually worsening.  Triage Disposition: Go to ED Now (Notify PCP)  Patient/caregiver understands and will follow disposition?: Yes         Copied from CRM #[id number]. Topic: Clinical - Red Word Triage >> Aug 28, 2024  8:34 AM [name] wrote: [facility] that prompted transfer to Nurse Triage: Patient states she is in extreme pain and very sick from not being able to urinate at all Reason for Disposition  [1] Unable to urinate (or only a few drops) > 4 hours AND [2] bladder feels very full (e.g., palpable bladder or strong urge to urinate)  Answer Assessment - Initial Assessment Questions Patient in tears during triage, due to pain. Patient mentioned during triage that at her last treatment for cancer in the hospital (last week) she had to have a straight catheter done and she said that provided a lot of relief.  1. SYMPTOM: What's the main symptom you're concerned about? (e.g., frequency, incontinence)     Urinary retention, abdominal pressure.  2. ONSET: When did the  retention  start?     She states she might have urinated a tablespoon yesterday; unable to urinate today. Ongoing x 10 days. She states she has been receiving chemotherapy/radiation for vaginal cancer.  3. PAIN: Is there any pain? If Yes, ask: How bad is it? (Scale: 1-10; mild, moderate, severe)     Yes, pressure 9-10/10.  4. CAUSE: What do you think is causing the  symptoms?     Recent chemo and radiation therapy for vaginal cancer. She has an appointment scheduled with urology. She states she had a vaginal and rectal exam about 2 weeks ago and states she has not felt the same since then.  5. OTHER SYMPTOMS: Do you have any other symptoms? (e.g., blood in urine, fever, flank pain, pain with urination)     Diarrhea (dark/almost black, incontinence/states it came on suddenly and she was not able to make it to the toilet) this morning, rectal pain.  6. PREGNANCY: Is there any chance you are pregnant? When was your last menstrual period?     N/A.  Protocols used: Urinary Symptoms-A-AH

## 2024-08-28 NOTE — Radiation Completion Notes (Signed)
  Radiation Oncology         [phone] ________________________________  Name: [name] MRN: [id number]  Date of Service: 08/25/2024  DOB: 01/12/1936  End of Treatment Note  Diagnosis: Vaginal cancer (HCC) FIGO Stage IVA (cT4, cN1, cM0)  Intent: Curative     ==========DELIVERED PLANS==========  First Treatment Date: 2024-08-21 Last Treatment Date: 2024-08-25   Plan Name: Pelvis_Bst Site: Vagina Technique: IMRT Mode: Photon Dose Per Fraction: 2 Gy Prescribed Dose (Delivered / Prescribed): 10 Gy / 10 Gy Prescribed Fxs (Delivered / Prescribed): 5 / 5   ====================================   The patient tolerated radiation. She reported dysuria and not being able to void regularly throughout her treatment. An In and Out Cath was performed and 350 mL of urine was collected. Urine culture was clear at that time.   The patient will return in one month and will continue follow up with Dr. Lonn as well.      Ronita Due, PA-C

## 2024-08-28 NOTE — ED Notes (Signed)
 Unable to assess volume of urine in bladder with bladder scanner.

## 2024-08-28 NOTE — ED Provider Notes (Signed)
  Physical Exam  BP (!) 155/58   Pulse 60   Temp 97.8 F (36.6 C) (Oral)   Resp 17   Ht 5' 5.5 (1.664 m)   Wt 83.6 kg   SpO2 97%   BMI 30.19 kg/m   Physical Exam  Procedures  Procedures  ED Course / MDM   Clinical Course as of 08/28/24 2038  Mon Aug 28, 2024  1536 Assumed care from Dr Bonnell. 88 yo F hx of vaginal cancer on radiation who pw subjective urinary retention x1 week. Getting a CT. Labs show normal renal function.  No evidence of UTI.  Given IV fluids and potassium.  Awaiting magnesium.  Not retaining urine here after Foley catheter was placed and had very minimal urine output. Getting radiation for cancer so may be radiation cystitis.  Outgoing team feels that the Foley may be removed if imaging is negative [RP]  1715 CT Renal Stone Study Minimal soft tissue prominence of the vaginal vault containing a focus of air as these findings are slightly more prominent compared to the prior exam. There is more stranding of the adjacent pelvic fat with small amount of free pelvic fluid present. Findings likely due to post radiation change.  Foley catheter within a decompressed bladder. There is ill definition of the bladder wall with stranding of the adjacent fat likely due to post radiation changes. Cystitis/infection could also produce these findings. Recommend clinical correlation. [RP]  8082 Dr Pearlean from hospitalist consulted for admission.  [RP]  2038 Has been evaluated by hospitalist.  Does not feel that she meets admission criteria at this point in time.  Will give additional round of IV potassium.  Will give p.o. magnesium and potassium to take at home.  Counseled not to take Lasix unless instructed to do so by doctor.  Will have her follow-up with her primary doctor in several days for repeat potassium and magnesium check [RP]    Clinical Course User Index [RP] [name], MD   Medical Decision Making Amount and/or Complexity of Data Reviewed Labs:  ordered. Radiology: ordered. Decision-making details documented in ED Course.  Risk OTC drugs. Prescription drug management.      [name], MD 08/28/24 2038

## 2024-08-28 NOTE — Consult Note (Signed)
 Initial Consultation Note   Patient: [name] FMW:[id number] DOB: 08/25/1936 PCP: [name], MD DOA: 08/28/2024 DOS: the patient was seen and examined on 08/28/2024 Primary service: [name], MD  Referring physician: Yolande  Reason for consult: Hypokalemia  Assessment/Plan:  Urinary retention-  Reports inability to fully void this started about 10 days ago, had Foley inserted with 350 cc drained 3 days ago, with subsequent return of symptoms.  3 cc of urine drained here in the ED.  CT renal stone study not suggestive of obstruction, also no findings to explain her symptoms.  She has been able to void here in the ED after the Foley was removed.  Vitals, BUN and creatinine show her renal function stable despite her symptoms.  She does not appear dehydrated. - At this time patient is not meeting criteria for admission, she will be discharged to follow-up with her outpatient providers-repeat blood work in about a week, and may need referral to urology, if she has true/objective signs of urinary tract obstruction.  Hypokalemia- 2.8.  Magnesium 1.4.  She is supposed be on potassium supplement, but has not been very compliant.  2 episodes of loose stools over the past 10 days, no vomiting. -IV KCl x 2, and 2 40 mEq KCl given in ED. - 2 g mag given in ED. - Patient should follow-up in about a week for repeat blood work including K and mag.  Locally advanced stage IV vaginal cancer-status post chemoradiation therapy currently on radiation therapy with Dr. Shannon.   HPI: [name] is a 88 y.o. female with past medical history of congestive heart failure, hypertension, hypothyroidism, idiopathic pulmonary fibrosis, paroxysmal atrial fibrillation. Patient presented to the ED with complaints of urinary retention this started about 10 days ago.  She reports barely any urine output until she saw her radiation oncologist-9/26 and had In-N-Out cath done with drainage of 350 cc of  urine.  She reported feeling better after this.  She reports subsequently she barely had any urine output, and today she started feeling pressure in the lower abdomen.  She reports 2 episodes of diarrhea, the first 1 was about 10 days ago, it was large amount, then she did not have any again until yesterday when she had 1 more episode.  Has not had any diarrhea today.  No vomiting.  She denies any cardiorespiratory symptoms.  ED course-vitals stable.  Patient had Foley catheter inserted in the ED with drainage of 200 cc of urine.  Patient felt better, with resolution of abdominal pressure.  CT renal stone study without acute findings in the abdomen and pelvis, prominence of vaginal vault, stranding of adjacent pelvic fat with small amount of pelvic fluid present, stranding of the bladder wall, post radiation changes.  Cystitis or infection could produce these findings.   UA with few bacteria. Potassium 2.8.  Magnesium 1.4 1.5 L bolus given.  Some supplementation started. Magnesium 2 g given.  Review of Systems: As mentioned in the history of present illness. All other systems reviewed and are negative.  Past Medical History:  Diagnosis Date   Arthritis    CHF (congestive heart failure) (HCC)    Chronic Leukemia    Depression    Dysrhythmia    A.fib   on Eliquis    H/O hiatal hernia    Hernia    Hernia    right inguinal hernia   Hypertension    Hypothyroidism    Leg swelling    Osteitis pubis  05/30/2001   Osteopenia 03/30/2009   Osteopenia 07/30/2022   Bone density 2021 showed elevated FRAX patient was started on Fosamax    Pneumonia    Thyroid  disease    Past Surgical History:  Procedure Laterality Date   ABDOMINAL HYSTERECTOMY  03/1970   APPENDECTOMY  03/1966   BILATERAL SALPINGOOPHORECTOMY     CARDIOVERSION N/A 03/10/2022   Procedure: CARDIOVERSION;  Surgeon: Francyne Headland, MD;  Location: [facility];  Service: Cardiovascular;  Laterality: N/A;   CATARACT EXTRACTION W/PHACO  Right 09/25/2014   Procedure: CATARACT EXTRACTION PHACO AND INTRAOCULAR LENS PLACEMENT (IOC);  Surgeon: Oneil T. Roz, MD;  Location: [facility];  Service: Ophthalmology;  Laterality: Right;  CDE:7.56   CATARACT EXTRACTION W/PHACO Left 10/09/2014   Procedure: CATARACT EXTRACTION PHACO AND INTRAOCULAR LENS PLACEMENT LEFT EYE CDE=6.51;  Surgeon: Oneil T. Roz, MD;  Location: [facility];  Service: Ophthalmology;  Laterality: Left;   CHOLECYSTECTOMY  1980   COLONOSCOPY  1999   COLONOSCOPY  March 2007   Repeat 10 years   COLONOSCOPY  11/06/2003    Internal and external hemorrhoids/Two tiny rectal distal sigmoid polyps cold biopsied/Otherwise within normal limits to the cecum   COLONOSCOPY N/A 08/29/2014   Dr. Shaaron: Sessile polyp was found at the cecum status post cold snare polypectomy, tubular adenoma. No further surveillance unless clinical status changes   CYSTOSCOPY N/A 05/24/2024   Procedure: CYSTOSCOPY;  Surgeon: Viktoria Comer SAUNDERS, MD;  Location: [facility];  Service: Gynecology;  Laterality: N/A;   ESOPHAGOGASTRODUODENOSCOPY  2007   Dr. Shaaron: normal esophagus, small tom oderate sized hiatal hernia, Cameron lesions   EXAM UNDER ANESTHESIA, PELVIC N/A 05/24/2024   Procedure: EXAM UNDER ANESTHESIA, PELVIC;  Surgeon: Viktoria Comer SAUNDERS, MD;  Location: [facility];  Service: Gynecology;  Laterality: N/A;   IR IMAGING GUIDED PORT INSERTION  05/23/2024   SKIN CANCER EXCISION Left 11 1 2016   left arm squamous cancer   TUMOR REMOVAL  03/1968   ovarian   Social History:  reports that she has never smoked. She has never been exposed to tobacco smoke. She has never used smokeless tobacco. She reports current alcohol use. She reports that she does not use drugs.  Allergies  Allergen Reactions   Erythromycin Shortness Of Breath   Aspirin Other (See Comments)    Caused internal bleeding. Patient had ulcer at the time. Instructed by PCP - Nancey not to take any longer.   Ibuprofen Other (See Comments)     Instructed not to take by PCP - Nancey due to reaction to aspirin as well (precautionary).    Family History  Problem Relation Age of Onset   Dementia Mother    Heart disease Father    Cancer Father        throat   Cancer Sister        breast   Breast cancer Sister    Lung cancer Grandson    Colon cancer Neg Hx    Lung disease Neg Hx    Autoimmune disease Neg Hx     Prior to Admission medications   Medication Sig Start Date End Date Taking? Authorizing Provider  acetaminophen  (TYLENOL ) 650 MG CR tablet Take 650 mg by mouth every 8 (eight) hours as needed for pain.    [provider]  albuterol  (VENTOLIN  HFA) 108 (90 Base) MCG/ACT inhaler INHALE 2 PUFFS INTO THE LUNGS EVERY 6 HOURS AS NEEDED FOR WHEEZING OR SHORTNESS OF BREATH 05/29/24   [name], MD  alendronate  (FOSAMAX )  70 MG tablet TAKE 1 TABLET(70 MG) BY MOUTH EVERY 7 DAYS WITH A FULL GLASS OF WATER  AND ON AN EMPTY STOMACH 08/16/23   Luking, Glendia LABOR, MD  ALPRAZolam  (XANAX ) 0.5 MG tablet TAKE 1 TABLET BY MOUTH EVERY NIGHT AT BEDTIME. MAY TAKE 1/2 TABLET DURING THE DAY AS NEEDED 04/04/24   [name], MD  Cholecalciferol (VITAMIN D3) 2000 UNITS TABS Take 2,000 Units by mouth daily.    [provider]  ciprofloxacin  (CIPRO ) 500 MG tablet Take 1 tablet (500 mg total) by mouth 2 (two) times daily. 07/24/24   Cook, Jayce G, DO  diphenoxylate -atropine  (LOMOTIL ) 2.5-0.025 MG tablet Take 1 tablet by mouth 4 (four) times daily as needed for diarrhea or loose stools. Not to exceed 8 tablets per day 07/13/24   Shannon Agent, MD  ELIQUIS  5 MG TABS tablet TAKE 1 TABLET(5 MG) BY MOUTH TWICE DAILY. FOLLOW-UP 03/30/24   [name], Mihai, MD  indapamide  (LOZOL ) 1.25 MG tablet Take 1 tablet (1.25 mg total) by mouth every morning. 02/11/24   [name], MD  levothyroxine  (SYNTHROID ) 125 MCG tablet TAKE 1 TABLET BY MOUTH DAILY 08/25/24   [name], MD  magnesium gluconate (MAGONATE) 500 (27 Mg) MG TABS tablet Take 500 mg  by mouth daily at 6 (six) AM.    [provider]  metoprolol  succinate (TOPROL -XL) 25 MG 24 hr tablet Take 1 tablet (25 mg total) by mouth daily. 07/13/24   Hoskins, Carolyn C, NP  Multiple Vitamin (MULTI-VITAMIN DAILY PO) Take 1 tablet by mouth daily.    [provider]  Nintedanib (OFEV ) 100 MG CAPS Take 1 capsule (100 mg total) by mouth 2 (two) times daily. **dose change** 05/26/24   Geronimo Amel, MD  ondansetron  (ZOFRAN -ODT) 4 MG disintegrating tablet Take 1 tablet (4 mg total) by mouth every 8 (eight) hours as needed. 05/06/24   Leath-Warren, [name], NP  pantoprazole  (PROTONIX ) 40 MG tablet TAKE 1 TABLET(40 MG) BY MOUTH DAILY 07/24/24   Cook, Jayce G, DO  potassium chloride  (KLOR-CON  M) 10 MEQ tablet TAKE 1 TABLET(10 MEQ) BY MOUTH DAILY 05/05/24   [name], MD  pregabalin  (LYRICA ) 25 MG capsule Take 25 mg by mouth 2 (two) times daily. 08/24/24   [provider]  sertraline  (ZOLOFT ) 100 MG tablet TAKE 1 TABLET BY MOUTH EVERY DAY 02/21/24   [name], MD    Physical Exam: Vitals:   08/28/24 1800 08/28/24 1815 08/28/24 1830 08/28/24 1836  BP: (!) 145/92 (!) 146/70 (!) 155/58   Pulse: 60 60 60   Resp: 17 13 17    Temp:    97.8 F (36.6 C)  TempSrc:    Oral  SpO2: 98% 95% 97%   Weight:      Height:        Constitutional: NAD, calm, comfortable Eyes: PERRL, lids and conjunctivae normal ENMT: Mucous membranes are moist.   Neck: normal, supple, no masses, no thyromegaly Respiratory: clear to auscultation bilaterally, no wheezing, no crackles. Normal respiratory effort. No accessory muscle use.  Cardiovascular: Regular rate and rhythm, no murmurs / rubs / gallops. No extremity edema.  Extremities warm. Abdomen: no tenderness, no masses palpated. No hepatosplenomegaly. Musculoskeletal: no clubbing / cyanosis. No joint deformity upper and lower extremities.  Skin: no rashes, lesions, ulcers. No induration Neurologic: No facial asymmetry, moving  extremity spontaneously, speech fluent.  Psychiatric: Normal judgment and insight. Alert and oriented x 3. Normal mood.   Family Communication: Grand daughter-in-law Tiffin, and  daughter-in-law at bedside.  Primary team communication: ED provider.  Thank you very much for involving us  in the care of your patient.  Author: Tully FORBES Carwin, MD 08/28/2024 8:48 PM  For on call review [email or web address].

## 2024-08-28 NOTE — Discharge Instructions (Signed)
 You were seen for your urinary attention and low magnesium and potassium in the emergency department.   At home, please stay well-hydrated.  Take the magnesium and potassium supplementation we have prescribed you.    Check your MyChart online for the results of any tests that had not resulted by the time you left the emergency department.   Follow-up with your primary doctor in 2-3 days regarding your visit.  Have them recheck your potassium and magnesium.  Do not take Lasix unless instructed to do so by a doctor  Return immediately to the emergency department if you experience any of the following: Palpitations, fainting, urinary retention, or any other concerning symptoms.    Thank you for visiting our Emergency Department. It was a pleasure taking care of you today.

## 2024-08-28 NOTE — ED Notes (Signed)
 Admitting MD at bedside.

## 2024-08-28 NOTE — ED Notes (Signed)
Pt ambulatory to bathroom with family member.

## 2024-08-28 NOTE — ED Provider Notes (Signed)
 [place] EMERGENCY DEPARTMENT AT [facility] Provider Note   CSN: [id number] Arrival date & time: 08/28/24  1018     Patient presents with: oliguria   [name] is a 88 y.o. female.   HPI 88 year old female presents with inability to urinate.  She is currently being treated for vaginal cancer with radiation.  She states that for 10 days she has not been able to urinate.  She had an In-N-Out cath 3 days ago with oncology.  She is reports no urination since then.  There is no specific dysuria whenever she has urinated in the past.  She denies any abdominal pain, flank/back pain, fevers, vomiting.  This morning she had 2 diarrheal bowel movements that were dark brown but no black or bloody stool.  She did have black stool a few weeks ago and has been referred to GI but has not seen them yet.  Prior to Admission medications   Medication Sig Start Date End Date Taking? Authorizing Provider  acetaminophen  (TYLENOL ) 650 MG CR tablet Take 650 mg by mouth every 8 (eight) hours as needed for pain.    [provider]  albuterol  (VENTOLIN  HFA) 108 (90 Base) MCG/ACT inhaler INHALE 2 PUFFS INTO THE LUNGS EVERY 6 HOURS AS NEEDED FOR WHEEZING OR SHORTNESS OF BREATH 05/29/24   [name], MD  alendronate  (FOSAMAX ) 70 MG tablet TAKE 1 TABLET(70 MG) BY MOUTH EVERY 7 DAYS WITH A FULL GLASS OF WATER  AND ON AN EMPTY STOMACH 08/16/23   Luking, Glendia LABOR, MD  ALPRAZolam  (XANAX ) 0.5 MG tablet TAKE 1 TABLET BY MOUTH EVERY NIGHT AT BEDTIME. MAY TAKE 1/2 TABLET DURING THE DAY AS NEEDED 04/04/24   [name], MD  Cholecalciferol (VITAMIN D3) 2000 UNITS TABS Take 2,000 Units by mouth daily.    [provider]  ciprofloxacin  (CIPRO ) 500 MG tablet Take 1 tablet (500 mg total) by mouth 2 (two) times daily. 07/24/24   Cook, Jayce G, DO  diphenoxylate -atropine  (LOMOTIL ) 2.5-0.025 MG tablet Take 1 tablet by mouth 4 (four) times daily as needed for diarrhea or loose stools. Not to exceed 8  tablets per day 07/13/24   Shannon Agent, MD  ELIQUIS  5 MG TABS tablet TAKE 1 TABLET(5 MG) BY MOUTH TWICE DAILY. FOLLOW-UP 03/30/24   [name], Mihai, MD  indapamide  (LOZOL ) 1.25 MG tablet Take 1 tablet (1.25 mg total) by mouth every morning. 02/11/24   [name], MD  levothyroxine  (SYNTHROID ) 125 MCG tablet TAKE 1 TABLET BY MOUTH DAILY 08/25/24   [name], MD  magnesium gluconate (MAGONATE) 500 (27 Mg) MG TABS tablet Take 500 mg by mouth daily at 6 (six) AM.    [provider]  metoprolol  succinate (TOPROL -XL) 25 MG 24 hr tablet Take 1 tablet (25 mg total) by mouth daily. 07/13/24   Hoskins, Carolyn C, NP  Multiple Vitamin (MULTI-VITAMIN DAILY PO) Take 1 tablet by mouth daily.    [provider]  Nintedanib (OFEV ) 100 MG CAPS Take 1 capsule (100 mg total) by mouth 2 (two) times daily. **dose change** 05/26/24   Geronimo Amel, MD  ondansetron  (ZOFRAN -ODT) 4 MG disintegrating tablet Take 1 tablet (4 mg total) by mouth every 8 (eight) hours as needed. 05/06/24   Leath-Warren, [name], NP  pantoprazole  (PROTONIX ) 40 MG tablet TAKE 1 TABLET(40 MG) BY MOUTH DAILY 07/24/24   Cook, Jayce G, DO  potassium chloride  (KLOR-CON  M) 10 MEQ tablet TAKE 1 TABLET(10 MEQ) BY MOUTH DAILY 05/05/24  [name], MD  sertraline  (ZOLOFT ) 100 MG tablet TAKE 1 TABLET BY MOUTH EVERY DAY 02/21/24   [name], MD    Allergies: Erythromycin, Aspirin, and Ibuprofen    Review of Systems  Gastrointestinal:  Positive for diarrhea. Negative for abdominal pain, blood in stool and vomiting.  Genitourinary:  Positive for decreased urine volume and difficulty urinating. Negative for dysuria and flank pain.  Musculoskeletal:  Negative for back pain.    Updated Vital Signs BP (!) 167/68   Pulse (!) 59   Temp 97.6 F (36.4 C)   Resp 17   Ht 5' 5.5 (1.664 m)   Wt 83.6 kg   SpO2 97%   BMI 30.19 kg/m   Physical Exam Vitals and nursing note reviewed.  Constitutional:      General: She is  not in acute distress.    Appearance: She is well-developed. She is not ill-appearing or diaphoretic.  HENT:     Head: Normocephalic and atraumatic.  Cardiovascular:     Rate and Rhythm: Normal rate and regular rhythm.     Heart sounds: Normal heart sounds.  Pulmonary:     Effort: Pulmonary effort is normal.     Breath sounds: Normal breath sounds.  Abdominal:     Palpations: Abdomen is soft.     Tenderness: There is no abdominal tenderness. There is no right CVA tenderness or left CVA tenderness.  Skin:    General: Skin is warm and dry.  Neurological:     Mental Status: She is alert.     (all labs ordered are listed, but only abnormal results are displayed) Labs Reviewed  URINALYSIS, W/ REFLEX TO CULTURE (INFECTION SUSPECTED) - Abnormal; Notable for the following components:      Result Value   Protein, ur 100 (*)    Bacteria, UA FEW (*)    All other components within normal limits  COMPREHENSIVE METABOLIC PANEL WITH GFR - Abnormal; Notable for the following components:   Potassium 2.8 (*)    Chloride 93 (*)    Creatinine, Ser 1.02 (*)    Calcium 8.5 (*)    Albumin 3.2 (*)    GFR, Estimated 54 (*)    All other components within normal limits  CBC WITH DIFFERENTIAL/PLATELET - Abnormal; Notable for the following components:   RBC 3.03 (*)    Hemoglobin 10.1 (*)    HCT 31.1 (*)    MCV 102.6 (*)    RDW 15.7 (*)    All other components within normal limits  MAGNESIUM    EKG: None  Radiology: No results found.   Procedures   Medications Ordered in the ED  potassium chloride  SA (KLOR-CON  M) CR tablet 40 mEq (has no administration in time range)  potassium chloride  10 mEq in 100 mL IVPB (has no administration in time range)  lactated ringers  bolus 1,000 mL (1,000 mLs Intravenous New Bag/Given 08/28/24 1425)                                   Medical Decision Making Amount and/or Complexity of Data Reviewed Labs: ordered.    Details: Hypokalemia Radiology:  ordered.  Risk Prescription drug management.   Foley catheter was placed as the bladder scanner was not working and there was concerned that she is not urinating from an obstruction.  However she did not have a large output from the catheter itself.  Creatinine is thankfully  normal, though it is unclear why she is not urinating like normal.  She was given some IV fluids.  Urine does not seem consistent with a UTI.  Does have hypokalemia here, will order an EKG and give potassium replacement.  CT will be ordered.  Care transferred to Dr. Yolande.     Final diagnoses:  None    ED Discharge Orders     None          Freddi Hamilton, MD 08/28/24 1553

## 2024-08-31 ENCOUNTER — Ambulatory Visit (INDEPENDENT_AMBULATORY_CARE_PROVIDER_SITE_OTHER): Payer: Self-pay | Admitting: Family Medicine

## 2024-08-31 ENCOUNTER — Encounter: Payer: Self-pay | Admitting: Family Medicine

## 2024-08-31 VITALS — BP 133/72 | HR 98 | Ht 65.5 in | Wt 188.0 lb

## 2024-08-31 DIAGNOSIS — E876 Hypokalemia: Secondary | ICD-10-CM | POA: Insufficient documentation

## 2024-08-31 DIAGNOSIS — R300 Dysuria: Secondary | ICD-10-CM | POA: Diagnosis not present

## 2024-08-31 NOTE — Progress Notes (Signed)
 Subjective:  Patient ID: [name], female    DOB: 06/18/1936  Age: 88 y.o. MRN: [id number]  CC:   Chief Complaint  Patient presents with   Follow-up    ED follow up    HPI:  88 year old female presents for ED follow-up.  Was recently seen in the ER on 9/29.  Was found to be hypokalemic and also had low magnesium.  Needs recheck today.  Patient states that overall she is doing okay.  She does report pain in the genital region.  He is currently receiving treatment for vaginal cancer and is receiving radiation.  She gave a urine specimen in the ER as she has been having some dysuria.  Culture not available.  Patient Active Problem List   Diagnosis Date Noted   Hypokalemia 08/31/2024   Dysuria 08/31/2024   DNR (do not resuscitate) 07/23/2024   Pancytopenia, acquired (HCC) 06/16/2024   Anemia due to antineoplastic chemotherapy 05/26/2024   Constipation 05/24/2024   Hypomagnesemia 05/16/2024   Goals of care, counseling/discussion 05/12/2024   Vaginal cancer (HCC) 04/28/2024   Idiopathic peripheral neuropathy 04/28/2024   Hearing deficit, bilateral 04/28/2024   Postmenopausal vaginal bleeding 03/29/2024   Urinary incontinence 03/29/2024   Acute cystitis with hematuria 03/08/2024   Chronic diastolic (congestive) heart failure (HCC) 01/28/2023   Lymphoproliferative disease (HCC) 01/28/2023   Idiopathic pulmonary fibrosis (HCC) 08/18/2022   Osteopenia 07/30/2022   Persistent atrial fibrillation (HCC)    Monoclonal B-cell lymphocytosis 01/21/2022   Bilateral leg edema 05/24/2015   GERD (gastroesophageal reflux disease) 07/02/2014   Hyperlipidemia 05/16/2013   Hypothyroidism 05/16/2013   Essential hypertension, benign 05/16/2013    Social Hx   Social History   Socioeconomic History   Marital status: Widowed    Spouse name: Not on file   Number of children: 1   Years of education: 22   Highest education level: Not on file  Occupational History   Occupation: Retired     Associate Professor: RETIRED    Comment: Ship broker, 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   Widowed since 07/19/21. Husband W.E. Bachicha, died from Kidney Cancer.   Son-Tommy lives nearby.   2 grandson's deceased, Clint and Penne. Penne passed 02/2022 from Brain Cancer.    Social Drivers of Corporate investment banker Strain: Low Risk  (07/07/2024)   Overall Financial Resource Strain (CARDIA)    Difficulty of Paying Living Expenses: Not hard at all  Food Insecurity: No Food Insecurity (07/07/2024)   Hunger Vital Sign    Worried About Running Out of Food in the Last Year: Never true    Ran Out of Food in the Last Year: Never true  Transportation Needs: No Transportation Needs (07/07/2024)   PRAPARE - Administrator, Civil Service (Medical): No    Lack of Transportation (Non-Medical): No  Physical Activity: Inactive (07/07/2024)   Exercise Vital Sign    Days of Exercise per Week: 0 days  Minutes of Exercise per Session: Not on file  Stress: No Stress Concern Present (07/07/2024)   [employer] of Occupational Health - Occupational Stress Questionnaire    Feeling of Stress: Only a little  Social Connections: Moderately Integrated (07/07/2024)   Social Connection and Isolation Panel    Frequency of Communication with Friends and Family: More than three times a week    Frequency of Social Gatherings with Friends and Family: Three times a week    Attends Religious Services: More than 4 times per year    Active Member of Clubs or  Organizations: Yes    Attends Banker Meetings: More than 4 times per year    Marital Status: Widowed    Review of Systems Per HPI  Objective:  BP 133/72   Pulse 98   Ht 5' 5.5 (1.664 m)   Wt 188 lb (85.3 kg)   SpO2 96%   BMI 30.81 kg/m      08/31/2024   10:19 AM 08/28/2024    9:45 PM 08/28/2024    6:30 PM  BP/Weight  Systolic BP 133 148 155  Diastolic BP 72 77 58  Wt. (Lbs) 188    BMI 30.81 kg/m2      Physical Exam Vitals and nursing note reviewed.  Constitutional:      General: She is not in acute distress.    Appearance: Normal appearance.  HENT:     Head: Normocephalic and atraumatic.  Cardiovascular:     Rate and Rhythm: Normal rate and regular rhythm.  Pulmonary:     Effort: Pulmonary effort is normal.     Breath sounds: Normal breath sounds.  Neurological:     Mental Status: She is alert.  Psychiatric:        Mood and Affect: Mood normal.        Behavior: Behavior normal.     Lab Results  Component Value Date   WBC 10.5 08/28/2024   HGB 10.1 (L) 08/28/2024   HCT 31.1 (L) 08/28/2024   PLT 340 08/28/2024   GLUCOSE 86 08/28/2024   CHOL 166 04/14/2023   TRIG 82 04/14/2023   HDL 65 04/14/2023   LDLCALC 86 04/14/2023   ALT 11 08/28/2024   AST 18 08/28/2024   NA 137 08/28/2024   K 2.8 (L) 08/28/2024   CL 93 (L) 08/28/2024   CREATININE 1.02 (H) 08/28/2024   BUN 18 08/28/2024   CO2 29 08/28/2024   TSH 4.100 11/05/2023     Assessment & Plan:  Hypokalemia Assessment & Plan: Patient on potassium supplementation.  Repeating metabolic panel today  and also checking magnesium.  Continue current dosing of potassium.  Orders: -     Basic metabolic panel with GFR -     Magnesium  Dysuria Assessment & Plan: Sending for culture.  Orders: -     Urine Culture    Follow-up: Advised follow-up with PCP  Jacqulyn Ahle DO [facility]

## 2024-08-31 NOTE — Assessment & Plan Note (Signed)
 Patient on potassium supplementation.  Repeating metabolic panel today  and also checking magnesium.  Continue current dosing of potassium.

## 2024-08-31 NOTE — Assessment & Plan Note (Signed)
Sending for culture;

## 2024-08-31 NOTE — Patient Instructions (Signed)
 Labs today.  Follow up with Dr. Glendia.

## 2024-09-01 DIAGNOSIS — R300 Dysuria: Secondary | ICD-10-CM | POA: Diagnosis not present

## 2024-09-01 LAB — BASIC METABOLIC PANEL WITH GFR
BUN/Creatinine Ratio: 13 (ref 12–28)
BUN: 11 mg/dL (ref 8–27)
CO2: 24 mmol/L (ref 20–29)
Calcium: 9.2 mg/dL (ref 8.7–10.3)
Chloride: 96 mmol/L (ref 96–106)
Creatinine, Ser: 0.86 mg/dL (ref 0.57–1.00)
Glucose: 89 mg/dL (ref 70–99)
Potassium: 4.1 mmol/L (ref 3.5–5.2)
Sodium: 135 mmol/L (ref 134–144)
eGFR: 66 mL/min/1.73 (ref >59)

## 2024-09-01 LAB — MAGNESIUM: Magnesium: 1.7 mg/dL (ref 1.6–2.3)

## 2024-09-03 ENCOUNTER — Ambulatory Visit: Payer: Self-pay | Admitting: Family Medicine

## 2024-09-05 ENCOUNTER — Other Ambulatory Visit: Payer: Self-pay | Admitting: Family Medicine

## 2024-09-05 LAB — URINE CULTURE

## 2024-09-05 MED ORDER — NITROFURANTOIN MONOHYD MACRO 100 MG PO CAPS: 100.0000 mg | ORAL_CAPSULE | Freq: Two times a day (BID) | ORAL | 0 refills | Status: DC

## 2024-09-06 ENCOUNTER — Telehealth: Payer: Self-pay | Admitting: *Deleted

## 2024-09-06 NOTE — Telephone Encounter (Signed)
 Pt called and stated that she has had several dark brown watery stools. She wants to know if she can have a stool test done, to see if she has blood in her stools.

## 2024-09-07 NOTE — Telephone Encounter (Signed)
 Attempted to reach patient to make her aware of results and recommendations, no answer or voicemail.

## 2024-09-07 NOTE — Telephone Encounter (Signed)
 Spoke to pt, informed her of recommendations. Pt voiced understanding. Pt states she is still having watery stools and she is weak. I informed her if she started to feel worse she should go to the ER. She voiced understanding.

## 2024-09-07 NOTE — Telephone Encounter (Signed)
-----   Message from Jacqulyn KANDICE Ahle sent at 09/05/2024  8:39 AM EDT ----- Culture positive. Rx sent in for Nitrofurantoin. ----- Message ----- From: Rebecka Memos Lab Results In Sent: 09/01/2024   5:36 AM EDT To: Jayce G Cook, DO

## 2024-09-08 DIAGNOSIS — K627 Radiation proctitis: Secondary | ICD-10-CM | POA: Diagnosis not present

## 2024-09-08 DIAGNOSIS — K921 Melena: Secondary | ICD-10-CM | POA: Diagnosis not present

## 2024-09-08 DIAGNOSIS — D649 Anemia, unspecified: Secondary | ICD-10-CM | POA: Diagnosis not present

## 2024-09-09 LAB — CBC
Hematocrit: 30.6 % — ABNORMAL LOW (ref 34.0–46.6)
Hemoglobin: 9.7 g/dL — ABNORMAL LOW (ref 11.1–15.9)
MCH: 33.3 pg — ABNORMAL HIGH (ref 26.6–33.0)
MCHC: 31.7 g/dL (ref 31.5–35.7)
MCV: 105 fL — ABNORMAL HIGH (ref 79–97)
Platelets: 420 x10E3/uL (ref 150–450)
RBC: 2.91 x10E6/uL — ABNORMAL LOW (ref 3.77–5.28)
RDW: 14.3 % (ref 11.7–15.4)
WBC: 11.8 x10E3/uL — ABNORMAL HIGH (ref 3.4–10.8)

## 2024-09-09 LAB — IRON AND TIBC
Iron Saturation: 18 % (ref 15–55)
Iron: 51 ug/dL (ref 27–139)
Total Iron Binding Capacity: 283 ug/dL (ref 250–450)
UIBC: 232 ug/dL (ref 118–369)

## 2024-09-09 LAB — B12 AND FOLATE PANEL
Folate: 6.8 ng/mL (ref >3.0)
Vitamin B-12: 307 pg/mL (ref 232–1245)

## 2024-09-09 LAB — FERRITIN: Ferritin: 166 ng/mL — ABNORMAL HIGH (ref 15–150)

## 2024-09-11 ENCOUNTER — Other Ambulatory Visit: Payer: Self-pay | Admitting: Family Medicine

## 2024-09-12 ENCOUNTER — Ambulatory Visit: Payer: Self-pay | Admitting: Gastroenterology

## 2024-09-12 ENCOUNTER — Other Ambulatory Visit: Payer: Self-pay | Admitting: *Deleted

## 2024-09-12 ENCOUNTER — Ambulatory Visit: Admitting: Gastroenterology

## 2024-09-12 ENCOUNTER — Encounter: Payer: Self-pay | Admitting: Hematology and Oncology

## 2024-09-12 DIAGNOSIS — K921 Melena: Secondary | ICD-10-CM | POA: Diagnosis not present

## 2024-09-12 LAB — IFOBT (OCCULT BLOOD): IFOBT: NEGATIVE

## 2024-09-12 NOTE — Progress Notes (Unsigned)
 GI Office Note    Referring Provider: [name], MD Primary Care Physician:  [name], MD Primary Gastroenterologist: Lamar HERO.Rourk, MD  Date:  09/13/2024  ID:  [name], DOB 1936-11-13, MRN [id number]  Chief Complaint   Chief Complaint  Patient presents with   Follow-up    Follow up. Stomach cramps all the times. Has lots of loose stools    History of Present Illness  [name] is a 88 y.o. female with a history of vaginal cancer, depression, pulmonary fibrosis, osteopenia, HTN, hypothyroidism, leukemia, CHF, and A-fib on Eliquis  (on hold) presenting today with complaint of loose stools and diarrhea.   Colonoscopy 2015: Sessile polyp in the cecum, remainder of colon normal.   Last seen in the office June 2017 by Dr. Shaaron.  She presented for follow-up of her reflux.  Noted history of colonic adenomas removed.  She was doing well on pantoprazole  40 mg every other day and having trouble with her weight.  She was trying to obtain a more healthy BMI and she started walking with her husband every day.  Denied any melena or rectal bleeding.  No abdominal pain dysphagia or odynophagia.  She is advised continue pantoprazole  40 mg daily to every other day and office visit 1 year or as needed.  Reflux information provided.  During this visit it was noted that she had a history of heme positive stool a year prior with recent EGD and colonoscopy up-to-date.   Per review of her chart she has recently been undergoing radiation for vaginal cancer.  Last session 07/05/2024.   See PCP 07/26/2024 noting a single bout of melena on 8/25 without abdominal pain.  Taking her Protonix  and holding her Eliquis .  Overall feeling well.  No additional bowel movement since the 1 episode of melena.  Cystitis symptoms improving on Cipro .  iFOBT + 07/26/2024.   PCP visit 08/02/2024 again noting frequent urination with small amounts.  Having fullness and discomfort in the bladder area.  In regards  to bowel movements she had a significant could have melena that occurred unexpectedly in the shower which was alarming embarrassing as a send stools have been darker than normal but not as severe as the prior episode.  Advised to continue holding blood thinner.  Last office visit 08/08/2024.  Reported 2 episodes of melena which occurred as loose stool that happened in the shower without any warning.  After all this her bowel movements returned to normal color and consistency has been dark brown and she denied any abdominal pain changes appetite chest pain or shortness of breath.  When the first episode happened in the shower she did have some mild abdominal cramping but this resolved.  She also reports soreness in her perineal area. Advised ongoing follow-up with oncology and GYN.  Resume Eliquis  and monitor for melena.  Advised CBC and iron panel in 2 weeks.  Consider endoscopic evaluation of black stool reoccurs or significant drop in hemoglobin.  Consider iron supplementation if anemia does not improve.  On 10/8 patient requested test to see if she has blood present in her stool, had been having dark brown watery stools.  Advise FOBT x 3 to assess for presence of blood in the stool.  On 10/9 patient reported still having watery stools and feeling weak.  Advised ED evaluation if she began to feel worse.     Latest Ref Rng & Units 09/08/2024    1:04 PM 08/28/2024   12:35 PM  08/07/2024   10:07 AM 07/24/2024    4:59 PM 07/14/2024    2:36 PM 06/30/2024   11:18 AM  CBC  WBC 3.4 - 10.8 x10E3/uL 11.8  10.5  7.0  8.9  8.4  3.7   Hemoglobin 11.1 - 15.9 g/dL 9.7  89.8  9.9  89.2  89.4  8.9   Hematocrit 34.0 - 46.6 % 30.6  31.1  29.8  31.9  30.9  27.4   Platelets 150 - 450 x10E3/uL 420  340  225  185  211  341    Iron/TIBC/Ferritin/ %Sat    Component Value Date/Time   IRON 51 09/08/2024 1304   TIBC 283 09/08/2024 1304   FERRITIN 166 (H) 09/08/2024 1304   IRONPCTSAT 18 09/08/2024 1304   FOBT 1/3 only  faintly positive.   Today:  Discussed the use of AI scribe software for clinical note transcription with the patient, who gave verbal consent to proceed.  She completed chemotherapy and radiation treatment for vaginal cancer on August 28, 2024. Since then, she experiences ongoing weakness and fatigue, stating she cannot regain her strength. She uses a cane for mobility but attempts to manage without it for short distances.  She reports changes in her bowel movements, noting they are no longer diarrhea and are not hard to pass. The stool color remains dark but not black, previously described as butch.' She completed three fecal occult blood tests, with one faintly positive result. She denies taking NSAIDs and only uses Tylenol  for pain. She experiences lower abdominal cramping, described as 'crampy,' occurring even when not needing to use the bathroom. This cramping began after her cancer treatment but sometimes related to her looser stools. Denies nausea, vomiting, upper abdominal pain.  She experiences sleep disturbances, stating she was 'up and down, up and down' all night and sometimes uses Xanax  to help with sleep. She has a history of a significant bleed years ago and is cautious with medications like NSAIDs. She has been taking pantoprazole  twice daily and resumed Eliquis .  She experiences shortness of breath with exertion but does not use oxygen at home, only a CPAP machine. She notes that she gets out of breath when moving but feels okay when resting. She has a history of pulmonary fibrosis and pulmonary hypertension as well as CHF.  She reports a taupe-brown vaginal discharge, which she changes pads for regularly due to concern about odor. No recent vaginal bleeding or incontinence episodes like those experienced in the past.  She lives alone with her dog and has family nearby who assist her. She is considering getting a life alert system for safety.      Wt Readings from Last 5  Encounters:  09/13/24 186 lb (84.4 kg)  08/31/24 188 lb (85.3 kg)  08/28/24 184 lb 3.2 oz (83.6 kg)  08/09/24 184 lb 3.2 oz (83.6 kg)  08/08/24 183 lb 12.8 oz (83.4 kg)    Current Outpatient Medications  Medication Sig Dispense Refill   acetaminophen  (TYLENOL ) 650 MG CR tablet Take 650 mg by mouth every 8 (eight) hours as needed for pain.     albuterol  (VENTOLIN  HFA) 108 (90 Base) MCG/ACT inhaler INHALE 2 PUFFS INTO THE LUNGS EVERY 6 HOURS AS NEEDED FOR WHEEZING OR SHORTNESS OF BREATH 18 g 1   alendronate  (FOSAMAX ) 70 MG tablet TAKE 1 TABLET(70 MG) BY MOUTH EVERY 7 DAYS WITH A FULL GLASS OF WATER  AND ON AN EMPTY STOMACH 4 tablet 3   ALPRAZolam  (XANAX ) 0.5 MG tablet  TAKE 1 TABLET BY MOUTH EVERY NIGHT AT BEDTIME. MAY TAKE 1/2 TABLET DURING THE DAY AS NEEDED 45 tablet 4   Cholecalciferol (VITAMIN D3) 2000 UNITS TABS Take 2,000 Units by mouth daily.     ciprofloxacin  (CIPRO ) 500 MG tablet Take 1 tablet (500 mg total) by mouth 2 (two) times daily. 14 tablet 0   diphenoxylate -atropine  (LOMOTIL ) 2.5-0.025 MG tablet Take 1 tablet by mouth 4 (four) times daily as needed for diarrhea or loose stools. Not to exceed 8 tablets per day 30 tablet 0   ELIQUIS  5 MG TABS tablet TAKE 1 TABLET(5 MG) BY MOUTH TWICE DAILY. FOLLOW-UP 180 tablet 1   indapamide  (LOZOL ) 1.25 MG tablet Take 1 tablet (1.25 mg total) by mouth every morning. 90 tablet 0   levothyroxine  (SYNTHROID ) 125 MCG tablet TAKE 1 TABLET BY MOUTH DAILY 90 tablet 1   magnesium gluconate (MAGONATE) 500 (27 Mg) MG TABS tablet Take 1 tablet (500 mg total) by mouth daily at 6 (six) AM. (Patient taking differently: Take 500 mg by mouth daily at 6 (six) AM. As needed) 30 tablet 2   metoprolol  succinate (TOPROL -XL) 25 MG 24 hr tablet Take 1 tablet (25 mg total) by mouth daily. 90 tablet 1   Multiple Vitamin (MULTI-VITAMIN DAILY PO) Take 1 tablet by mouth daily.     Nintedanib (OFEV ) 100 MG CAPS Take 1 capsule (100 mg total) by mouth 2 (two) times daily.  **dose change** 180 capsule 1   nitrofurantoin, macrocrystal-monohydrate, (MACROBID) 100 MG capsule Take 1 capsule (100 mg total) by mouth 2 (two) times daily. 14 capsule 0   ondansetron  (ZOFRAN -ODT) 4 MG disintegrating tablet Take 1 tablet (4 mg total) by mouth every 8 (eight) hours as needed. 20 tablet 0   pantoprazole  (PROTONIX ) 40 MG tablet TAKE 1 TABLET(40 MG) BY MOUTH DAILY 90 tablet 1   potassium chloride  (KLOR-CON  M) 20 MEQ tablet TAKE 1 TABLET (20 MEQ) BY MOUTH DAILY 30 tablet 2   pregabalin  (LYRICA ) 25 MG capsule Take 25 mg by mouth 2 (two) times daily.     sertraline  (ZOLOFT ) 100 MG tablet TAKE 1 TABLET BY MOUTH EVERY DAY 90 tablet 1   No current facility-administered medications for this visit.    Past Medical History:  Diagnosis Date   Arthritis    CHF (congestive heart failure) (HCC)    Chronic Leukemia    Depression    Dysrhythmia    A.fib   on Eliquis    H/O hiatal hernia    Hernia    Hernia    right inguinal hernia   Hypertension    Hypothyroidism    Leg swelling    Osteitis pubis 05/30/2001   Osteopenia 03/30/2009   Osteopenia 07/30/2022   Bone density 2021 showed elevated FRAX patient was started on Fosamax    Pneumonia    Thyroid  disease     Past Surgical History:  Procedure Laterality Date   ABDOMINAL HYSTERECTOMY  03/1970   APPENDECTOMY  03/1966   BILATERAL SALPINGOOPHORECTOMY     CARDIOVERSION N/A 03/10/2022   Procedure: CARDIOVERSION;  Surgeon: Francyne Headland, MD;  Location: [facility];  Service: Cardiovascular;  Laterality: N/A;   CATARACT EXTRACTION W/PHACO Right 09/25/2014   Procedure: CATARACT EXTRACTION PHACO AND INTRAOCULAR LENS PLACEMENT (IOC);  Surgeon: Oneil T. Roz, MD;  Location: [facility];  Service: Ophthalmology;  Laterality: Right;  CDE:7.56   CATARACT EXTRACTION W/PHACO Left 10/09/2014   Procedure: CATARACT EXTRACTION PHACO AND INTRAOCULAR LENS PLACEMENT LEFT EYE CDE=6.51;  Surgeon: Oneil T. Roz,  MD;  Location: [facility];  Service:  Ophthalmology;  Laterality: Left;   CHOLECYSTECTOMY  1980   COLONOSCOPY  1999   COLONOSCOPY  March 2007   Repeat 10 years   COLONOSCOPY  11/06/2003    Internal and external hemorrhoids/Two tiny rectal distal sigmoid polyps cold biopsied/Otherwise within normal limits to the cecum   COLONOSCOPY N/A 08/29/2014   Dr. Shaaron: Sessile polyp was found at the cecum status post cold snare polypectomy, tubular adenoma. No further surveillance unless clinical status changes   CYSTOSCOPY N/A 05/24/2024   Procedure: CYSTOSCOPY;  Surgeon: Viktoria Comer SAUNDERS, MD;  Location: [facility];  Service: Gynecology;  Laterality: N/A;   ESOPHAGOGASTRODUODENOSCOPY  2007   Dr. Shaaron: normal esophagus, small tom oderate sized hiatal hernia, Cameron lesions   EXAM UNDER ANESTHESIA, PELVIC N/A 05/24/2024   Procedure: EXAM UNDER ANESTHESIA, PELVIC;  Surgeon: Viktoria Comer SAUNDERS, MD;  Location: [facility];  Service: Gynecology;  Laterality: N/A;   IR IMAGING GUIDED PORT INSERTION  05/23/2024   SKIN CANCER EXCISION Left 11 1 2016   left arm squamous cancer   TUMOR REMOVAL  03/1968   ovarian    Family History  Problem Relation Age of Onset   Dementia Mother    Heart disease Father    Cancer Father        throat   Cancer Sister        breast   Breast cancer Sister    Lung cancer Grandson    Colon cancer Neg Hx    Lung disease Neg Hx    Autoimmune disease Neg Hx     Allergies as of 09/13/2024 - Review Complete 09/13/2024  Allergen Reaction Noted   Erythromycin Shortness Of Breath 04/15/2012   Aspirin Other (See Comments) 04/15/2012   Ibuprofen Other (See Comments) 04/15/2012    Social History   Socioeconomic History   Marital status: Widowed    Spouse name: Not on file   Number of children: 1   Years of education: 41   Highest education level: Not on file  Occupational History   Occupation: Retired    Associate Professor: RETIRED    Comment: Ship broker, 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   Widowed since 16-Aug-2021. Husband W.E. Laughery, died from Kidney Cancer.   Son-Tommy lives nearby.   2 grandson's deceased, Clint and Penne. Penne passed 02/2022 from Brain Cancer.    Social Drivers of Corporate investment banker Strain: Low Risk  (07/07/2024)   Overall Financial Resource Strain (CARDIA)    Difficulty of Paying Living Expenses: Not hard at all  Food Insecurity: No Food Insecurity (07/07/2024)   Hunger Vital Sign    Worried About Running Out of Food in the Last Year: Never true    Ran Out of Food in the Last Year: Never true  Transportation Needs: No Transportation Needs (07/07/2024)   PRAPARE - Administrator, Civil Service (Medical): No  Lack of Transportation (Non-Medical): No  Physical Activity: Inactive (07/07/2024)   Exercise Vital Sign    Days of Exercise per Week: 0 days    Minutes of Exercise per Session: Not on file  Stress: No Stress Concern Present (07/07/2024)   [employer] of Occupational Health - Occupational Stress Questionnaire    Feeling of Stress: Only a little  Social Connections: Moderately Integrated (07/07/2024)   Social Connection and Isolation Panel    Frequency of Communication with Friends and Family: More than three times a week    Frequency of Social Gatherings with Friends and Family: Three times a week    Attends Religious Services: More than 4 times per year    Active Member of Clubs or Organizations: Yes    Attends Banker Meetings: More than 4 times per year    Marital Status: Widowed      Review of Systems   Gen: + weakness and fatigue. Denies fever, chills, anorexia. Denies weight loss.  CV: Denies chest pain, palpitations, syncope, peripheral edema, and claudication. Resp: + DOE. Denies dyspnea at rest, cough, wheezing, coughing up blood, and pleurisy. GI: See HPI Derm: Denies rash, itching, dry skin Psych: + anxiety and memory loss. Denies depression, confusion. No homicidal or suicidal ideation.  Heme: Denies bruising, bleeding, and enlarged lymph nodes. GU: + vaginal discharge and urinary incontinence  Physical Exam   BP 92/65 (BP Location: Right Arm, Patient Position: Sitting, Cuff Size: Normal)   Pulse (!) 110   Temp 97.8 F (36.6 C) (Temporal)   Ht 5' 5.5 (1.664 m)   Wt 186 lb (84.4 kg)   BMI 30.48 kg/m   General:   Alert and oriented. No distress noted. Pleasant and cooperative.  Head:  Normocephalic and atraumatic. Eyes:  Conjuctiva clear without scleral icterus. Mouth:  Oral mucosa pink and moist. Good dentition.No lesions. Lungs:  Clear to auscultation bilaterally. No wheezes, rales, or rhonchi. No distress.  Heart:  S1, S2 present without murmurs appreciated.  Abdomen:  +BS, soft, non-distended. Ttp to mid lower abdomen, more suprapubic. No rebound or guarding. No HSM or masses noted. Rectal: deferred Msk:  Symmetrical without gross deformities. Normal posture. Extremities: 1+ edema bilaterally.  Neurologic:  Alert and oriented x4 Psych:  Alert and cooperative. Normal mood and affect.  Assessment & Plan  KEZIA BENEVIDES is a 88 y.o. female presenting today to follow up on abdominal cramping, melena, and loose stools.      Vaginal cancer status post chemotherapy and radiation with possible radiation proctitis and vaginitis Completed chemotherapy and radiation for vaginal cancer. Reports cramping and dark stools, likely related to radiation proctitis and vaginitis. Experiences lower abdominal cramping post-treatment. No vaginal bleeding, but  reports taupey brown drainage from the vaginal area and follows with GYN and oncology. - Continue Tylenol  as needed for cramping. - Use Imodium for loose stools if needed. - Avoid Pepto Bismol - Continue follow up with GYN and oncology  Anemia likely multifactorial (chemotherapy, ?leukemia, vaginal cancer, possible gastrointestinal blood loss) Anemia is likely multifactorial, possibly due to chemotherapy, potential leukemia, vaginal cancer, and possible gastrointestinal blood loss. Hemoglobin is slightly decreased at 9.7, has been within the 8-10 range over the past several months. Iron panels are normal with slightly elevated ferritin, possibly reactive due to radiation. Last colonoscopy in 2015 with polyp removed. No prior EGD on file.  - Proceed with upper endoscopy and colonoscopy to evaluate for gastrointestinal blood loss. - Continue monitoring hemoglobin levels - Continue  PPI BID  Proceed with upper endoscopy and colonoscopy with propofol  by Dr. Shaaron in near future: the risks, benefits, and alternatives have been discussed with the patient in detail. The patient states understanding and desires to proceed. ASA 4  Cardiac clearance both medical and to hold Eliquis  for 48 hours.  Melena, dark stools, loose stools, lower abdominal pain Reports dark stools, not black but darker than normal, and loose stools. Completed three FOPT samples, with one faintly positive for blood. Dark stools and abdominal pain may be related to radiation effects or gastrointestinal bleeding. Further evaluation needed for possible gastrointestinal blood loss. Reports history of right inguinal hernia - not well appreciated today on exam. - Procedures as above - Monitor stool color and consistency - Continue PPI BID - Imodium as needed.  - Avoid NSAIDs      Follow-up   Follow up 3 months.     Charmaine Melia, MSN, FNP-BC, AGACNP-BC [facility]

## 2024-09-12 NOTE — Progress Notes (Unsigned)
 Pt did 3 IFOBT test and were negative and 1 was positive

## 2024-09-13 ENCOUNTER — Encounter: Payer: Self-pay | Admitting: Urology

## 2024-09-13 ENCOUNTER — Telehealth: Payer: Self-pay | Admitting: *Deleted

## 2024-09-13 ENCOUNTER — Ambulatory Visit: Admitting: Urology

## 2024-09-13 ENCOUNTER — Encounter: Payer: Self-pay | Admitting: Gastroenterology

## 2024-09-13 ENCOUNTER — Ambulatory Visit: Admitting: Gastroenterology

## 2024-09-13 VITALS — BP 92/65 | HR 110 | Temp 97.8°F | Ht 65.5 in | Wt 186.0 lb

## 2024-09-13 VITALS — BP 148/78 | HR 70

## 2024-09-13 DIAGNOSIS — N3281 Overactive bladder: Secondary | ICD-10-CM

## 2024-09-13 DIAGNOSIS — N390 Urinary tract infection, site not specified: Secondary | ICD-10-CM

## 2024-09-13 DIAGNOSIS — K921 Melena: Secondary | ICD-10-CM

## 2024-09-13 DIAGNOSIS — R195 Other fecal abnormalities: Secondary | ICD-10-CM | POA: Diagnosis not present

## 2024-09-13 DIAGNOSIS — K627 Radiation proctitis: Secondary | ICD-10-CM | POA: Diagnosis not present

## 2024-09-13 DIAGNOSIS — R339 Retention of urine, unspecified: Secondary | ICD-10-CM

## 2024-09-13 DIAGNOSIS — D649 Anemia, unspecified: Secondary | ICD-10-CM

## 2024-09-13 DIAGNOSIS — C52 Malignant neoplasm of vagina: Secondary | ICD-10-CM

## 2024-09-13 DIAGNOSIS — R1030 Lower abdominal pain, unspecified: Secondary | ICD-10-CM | POA: Diagnosis not present

## 2024-09-13 LAB — BLADDER SCAN AMB NON-IMAGING: Scan Result: 1

## 2024-09-13 MED ORDER — GEMTESA 75 MG PO TABS: 1.0000 | ORAL_TABLET | Freq: Every day | ORAL | Status: DC

## 2024-09-13 NOTE — Telephone Encounter (Signed)
  Request for patient to stop medication prior to procedure or is needing cleareance  09/13/24  [name] 11-Sep-1936  What type of surgery is being performed? Colonoscopy/ Esophagogastroduodenoscopy (EGD)  When is surgery scheduled? TBD  What type of clearance is required (medical or pharmacy to hold medication or both? BOTHE  Patient is needing to have cardiac clearance and clearance to hold Eliquis  x 2 days.  Name of physician performing surgery?  Dr.Rourk [facility] [employer]: [phone], option 5 Fax: [phone]  Anesthesia type (none, local, MAC, general)? MAC   ? Yes ? No Patient can hold medication as requested   Signature: ___________________________

## 2024-09-13 NOTE — Progress Notes (Signed)
   Patient cannot void prior to the bladder scan. Bladder scan result: 1  Performed By: [facility] LPN

## 2024-09-13 NOTE — Patient Instructions (Addendum)
 We will get you scheduled for upper endoscopy and colonoscopy in the near future Dr. Shaaron.  This is to further evaluate your black stools, lower abdominal pain, and slightly worsening anemia.  We will get clearance from cardiology to go forward with your procedures and if clearance given we will get you scheduled.  Continue taking your pantoprazole  40 mg twice daily.  For now continue your Eliquis  until you are told to hold for the procedure.  Keep follow-ups with urology, hematology, and cardiology.  You may use Imodium 2 mg as needed for significantly looser stools, please try to limit your use is much as possible.  Try to avoid lactose/dairy products.  Please let us  know if you have any significant bright red blood in your stool, dark red stool, or return of black/tarry stool.  Please also let us  know if you begin having significant shortness of breath over your baseline, significant fatigue, or syncope.  Follow-up in 3 months, sooner if needed.  It was a pleasure to see you today. I want to create trusting relationships with patients. If you receive a survey regarding your visit,  I greatly appreciate you taking time to fill this out on paper or through your MyChart. I value your feedback.  Charmaine Melia, MSN, FNP-BC, AGACNP-BC [facility]

## 2024-09-13 NOTE — Progress Notes (Signed)
 09/13/2024 12:26 PM   [name] 1936-02-23 [id number]  Referring provider: [name], MD [address] [place],  [place] [address]  Urinary frequency   HPI: [name] is a [phone] here for evaluation of difficulty urinating. She was recently treated for Stage 4 vaginal cancer and complete radiation therapy end of September 2025. While she was being treated she developed urinary frequency, urgency, and nocturia. She is using 10-12 pads per day since starting radiation. She was not using pads prior to radiation.    PMH: Past Medical History:  Diagnosis Date   Arthritis    CHF (congestive heart failure) (HCC)    Chronic Leukemia    Depression    Dysrhythmia    A.fib   on Eliquis    H/O hiatal hernia    Hernia    Hernia    right inguinal hernia   Hypertension    Hypothyroidism    Leg swelling    Osteitis pubis 05/30/2001   Osteopenia 03/30/2009   Osteopenia 07/30/2022   Bone density 2021 showed elevated FRAX patient was started on Fosamax    Pneumonia    Thyroid  disease     Surgical History: Past Surgical History:  Procedure Laterality Date   ABDOMINAL HYSTERECTOMY  03/1970   APPENDECTOMY  03/1966   BILATERAL SALPINGOOPHORECTOMY     CARDIOVERSION N/A 03/10/2022   Procedure: CARDIOVERSION;  Surgeon: Francyne Headland, MD;  Location: [facility];  Service: Cardiovascular;  Laterality: N/A;   CATARACT EXTRACTION W/PHACO Right 09/25/2014   Procedure: CATARACT EXTRACTION PHACO AND INTRAOCULAR LENS PLACEMENT (IOC);  Surgeon: Oneil T. Roz, MD;  Location: [facility];  Service: Ophthalmology;  Laterality: Right;  CDE:7.56   CATARACT EXTRACTION W/PHACO Left 10/09/2014   Procedure: CATARACT EXTRACTION PHACO AND INTRAOCULAR LENS PLACEMENT LEFT EYE CDE=6.51;  Surgeon: Oneil T. Roz, MD;  Location: [facility];  Service: Ophthalmology;  Laterality: Left;   CHOLECYSTECTOMY  1980   COLONOSCOPY  1999   COLONOSCOPY  March 2007   Repeat 10 years   COLONOSCOPY  11/06/2003     Internal and external hemorrhoids/Two tiny rectal distal sigmoid polyps cold biopsied/Otherwise within normal limits to the cecum   COLONOSCOPY N/A 08/29/2014   Dr. Shaaron: Sessile polyp was found at the cecum status post cold snare polypectomy, tubular adenoma. No further surveillance unless clinical status changes   CYSTOSCOPY N/A 05/24/2024   Procedure: CYSTOSCOPY;  Surgeon: Viktoria Comer SAUNDERS, MD;  Location: [facility];  Service: Gynecology;  Laterality: N/A;   ESOPHAGOGASTRODUODENOSCOPY  2007   Dr. Shaaron: normal esophagus, small tom oderate sized hiatal hernia, Cameron lesions   EXAM UNDER ANESTHESIA, PELVIC N/A 05/24/2024   Procedure: EXAM UNDER ANESTHESIA, PELVIC;  Surgeon: Viktoria Comer SAUNDERS, MD;  Location: [facility];  Service: Gynecology;  Laterality: N/A;   IR IMAGING GUIDED PORT INSERTION  05/23/2024   SKIN CANCER EXCISION Left 11 1 2016   left arm squamous cancer   TUMOR REMOVAL  03/1968   ovarian    Home Medications:  Allergies as of 09/13/2024       Reactions   Erythromycin Shortness Of Breath   Aspirin Other (See Comments)   Caused internal bleeding. Patient had ulcer at the time. Instructed by PCP - [name] not to take any longer.   Ibuprofen Other (See Comments)   Instructed not to take by PCP - [name] due to reaction to aspirin as well (precautionary).        Medication List  Accurate as of September 13, 2024 12:26 PM. If you have any questions, ask your nurse or doctor.          acetaminophen  650 MG CR tablet Commonly known as: TYLENOL  Take 650 mg by mouth every 8 (eight) hours as needed for pain.   albuterol  108 (90 Base) MCG/ACT inhaler Commonly known as: VENTOLIN  HFA INHALE 2 PUFFS INTO THE LUNGS EVERY 6 HOURS AS NEEDED FOR WHEEZING OR SHORTNESS OF BREATH   alendronate  70 MG tablet Commonly known as: FOSAMAX  TAKE 1 TABLET(70 MG) BY MOUTH EVERY 7 DAYS WITH A FULL GLASS OF WATER  AND ON AN EMPTY STOMACH   ALPRAZolam  0.5 MG tablet Commonly known as:  XANAX  TAKE 1 TABLET BY MOUTH EVERY NIGHT AT BEDTIME. MAY TAKE 1/2 TABLET DURING THE DAY AS NEEDED   ciprofloxacin  500 MG tablet Commonly known as: Cipro  Take 1 tablet (500 mg total) by mouth 2 (two) times daily.   diphenoxylate -atropine  2.5-0.025 MG tablet Commonly known as: LOMOTIL  Take 1 tablet by mouth 4 (four) times daily as needed for diarrhea or loose stools. Not to exceed 8 tablets per day   Eliquis  5 MG Tabs tablet Generic drug: apixaban  TAKE 1 TABLET(5 MG) BY MOUTH TWICE DAILY. FOLLOW-UP   indapamide  1.25 MG tablet Commonly known as: LOZOL  Take 1 tablet (1.25 mg total) by mouth every morning.   levothyroxine  125 MCG tablet Commonly known as: SYNTHROID  TAKE 1 TABLET BY MOUTH DAILY   magnesium gluconate 500 (27 Mg) MG Tabs tablet Commonly known as: MAGONATE Take 1 tablet (500 mg total) by mouth daily at 6 (six) AM. What changed: additional instructions   metoprolol  succinate 25 MG 24 hr tablet Commonly known as: TOPROL -XL Take 1 tablet (25 mg total) by mouth daily.   MULTI-VITAMIN DAILY PO Take 1 tablet by mouth daily.   nitrofurantoin (macrocrystal-monohydrate) 100 MG capsule Commonly known as: MACROBID Take 1 capsule (100 mg total) by mouth 2 (two) times daily.   Ofev  100 MG Caps Generic drug: Nintedanib Take 1 capsule (100 mg total) by mouth 2 (two) times daily. **dose change**   ondansetron  4 MG disintegrating tablet Commonly known as: ZOFRAN -ODT Take 1 tablet (4 mg total) by mouth every 8 (eight) hours as needed.   pantoprazole  40 MG tablet Commonly known as: PROTONIX  TAKE 1 TABLET(40 MG) BY MOUTH DAILY   potassium chloride  SA 20 MEQ tablet Commonly known as: KLOR-CON  M TAKE 1 TABLET (20 MEQ) BY MOUTH DAILY   pregabalin  25 MG capsule Commonly known as: LYRICA  Take 25 mg by mouth 2 (two) times daily.   sertraline  100 MG tablet Commonly known as: ZOLOFT  TAKE 1 TABLET BY MOUTH EVERY DAY   Vitamin D3 50 MCG (2000 UT) Tabs Take 2,000 Units by  mouth daily.        Allergies:  Allergies  Allergen Reactions   Erythromycin Shortness Of Breath   Aspirin Other (See Comments)    Caused internal bleeding. Patient had ulcer at the time. Instructed by PCP - [name] not to take any longer.   Ibuprofen Other (See Comments)    Instructed not to take by PCP - [name] due to reaction to aspirin as well (precautionary).    Family History: Family History  Problem Relation Age of Onset   Dementia Mother    Heart disease Father    Cancer Father        throat   Cancer Sister        breast   Breast cancer Sister  Lung cancer Grandson    Colon cancer Neg Hx    Lung disease Neg Hx    Autoimmune disease Neg Hx     Social History:  reports that she has never smoked. She has never been exposed to tobacco smoke. She has never used smokeless tobacco. She reports current alcohol use. She reports that she does not use drugs.  ROS: All other review of systems were reviewed and are negative except what is noted above in HPI  Physical Exam: BP (!) 148/78   Pulse 70   Constitutional:  Alert and oriented, No acute distress. HEENT: [place] AT, moist mucus membranes.  Trachea midline, no masses. Cardiovascular: No clubbing, cyanosis, or edema. Respiratory: Normal respiratory effort, no increased work of breathing. GI: Abdomen is soft, nontender, nondistended, no abdominal masses GU: No CVA tenderness.  Lymph: No cervical or inguinal lymphadenopathy. Skin: No rashes, bruises or suspicious lesions. Neurologic: Grossly intact, no focal deficits, moving all 4 extremities. Psychiatric: Normal mood and affect.  Laboratory Data: Lab Results  Component Value Date   WBC 11.8 (H) 09/08/2024   HGB 9.7 (L) 09/08/2024   HCT 30.6 (L) 09/08/2024   MCV 105 (H) 09/08/2024   PLT 420 09/08/2024    Lab Results  Component Value Date   CREATININE 0.86 08/31/2024    No results found for: PSA  No results found for: TESTOSTERONE  No results found  for: HGBA1C  Urinalysis    Component Value Date/Time   COLORURINE YELLOW 08/28/2024 1153   APPEARANCEUR CLEAR 08/28/2024 1153   LABSPEC 1.030 08/28/2024 1153   PHURINE 5.0 08/28/2024 1153   GLUCOSEU NEGATIVE 08/28/2024 1153   HGBUR NEGATIVE 08/28/2024 1153   BILIRUBINUR NEGATIVE 08/28/2024 1153   BILIRUBINUR negative 08/03/2024 1603   KETONESUR NEGATIVE 08/28/2024 1153   PROTEINUR 100 (A) 08/28/2024 1153   UROBILINOGEN 0.2 08/03/2024 1603   NITRITE NEGATIVE 08/28/2024 1153   LEUKOCYTESUR NEGATIVE 08/28/2024 1153    Lab Results  Component Value Date   LABMICR 5.3 11/05/2023   BACTERIA FEW (A) 08/28/2024    Pertinent Imaging:  No results found for this or any previous visit.  No results found for this or any previous visit.  No results found for this or any previous visit.  No results found for this or any previous visit.  No results found for this or any previous visit.  No results found for this or any previous visit.  No results found for this or any previous visit.  Results for orders placed during the hospital encounter of 08/28/24  CT Renal Stone Study  Narrative CLINICAL DATA:  Oliguria 10 days. Abdominal pain and discomfort. Patient reports symptoms began before finishing radiation therapy for vaginal cancer.  EXAM: CT ABDOMEN AND PELVIS WITHOUT CONTRAST  TECHNIQUE: Multidetector CT imaging of the abdomen and pelvis was performed following the standard protocol without IV contrast.  RADIATION DOSE REDUCTION: This exam was performed according to the departmental dose-optimization program which includes automated exposure control, adjustment of the mA and/or kV according to patient size and/or use of iterative reconstruction technique.  COMPARISON:  MRI pelvis 07/10/2024, PET-CT 04/20/2024  FINDINGS: Lower chest: Borderline cardiomegaly. Calcified plaque over the descending thoracic aorta. Moderate size hiatal hernia. Lung bases demonstrate no  acute airspace disease or effusion.  Hepatobiliary: Previous cholecystectomy. Liver and biliary tree are normal.  Pancreas: Normal.  Spleen: Normal.  Adrenals/Urinary Tract: Adrenal glands are normal. Kidneys are normal in size with minimal fullness of the intrarenal collecting systems. No nephrolithiasis.  Right renal cyst unchanged is no follow-up imaging recommended. Ureters are normal. Foley catheter is present within a decompressed bladder. There is ill definition of the bladder wall with stranding of the adjacent fat likely due to post radiation changes.  Stomach/Bowel: Moderate size hiatal hernia unchanged. Multiple surgical clips over the hiatal hernia and stomach as well as upper abdomen. Small bowel is unremarkable. Colon is unremarkable. Appendix is not visualized.  Vascular/Lymphatic: Minimal calcified plaque over the abdominal aorta which is normal caliber. Remaining vascular structures are unremarkable. No significant adenopathy.  Reproductive: Uterus and bilateral adnexa are unremarkable. Minimal soft tissue prominence of the vaginal vault containing a focus of air as these findings are slightly more prominent compared to the prior exam. There is more stranding of the adjacent pelvic fat with small amount of free pelvic fluid present. Findings likely due to post radiation change.  Other: Moderate size right inguinal hernia containing a short segment of small bowel unchanged.  Musculoskeletal: No focal abnormality.  IMPRESSION: 1. No acute findings in the abdomen/pelvis. 2. Minimal soft tissue prominence of the vaginal vault containing a focus of air as these findings are slightly more prominent compared to the prior exam. There is more stranding of the adjacent pelvic fat with small amount of free pelvic fluid present. Findings likely due to post radiation change. 3. Foley catheter within a decompressed bladder. There is ill definition of the bladder wall  with stranding of the adjacent fat likely due to post radiation changes. Cystitis/infection could also produce these findings. Recommend clinical correlation. 4. Moderate size hiatal hernia unchanged. 5. Moderate size right inguinal hernia containing a short segment of small bowel unchanged. 6. Aortic atherosclerosis.  Aortic Atherosclerosis (ICD10-I70.0).   Electronically Signed By: Toribio Agreste M.D. On: 08/28/2024 16:58   Assessment & Plan:    1. OAB We will trial Gemtesa 75mg  daily. Followup 4-6 weeks.  - Urinalysis, Routine w reflex microscopic - BLADDER SCAN AMB NON-IMAGING   No follow-ups on file.  Belvie Clara, MD  [facility] [place]

## 2024-09-13 NOTE — Telephone Encounter (Signed)
 Pharmacy please advise on holding Eliquis  for 2 days prior to Colonoscopy/ Esophagogastroduodenoscopy (EGD)  scheduled for TBD. Last labs (CBC) 09/08/24 and (BMET) 08/31/2024. Thank you.

## 2024-09-13 NOTE — Patient Instructions (Signed)

## 2024-09-14 DIAGNOSIS — R339 Retention of urine, unspecified: Secondary | ICD-10-CM | POA: Diagnosis not present

## 2024-09-14 LAB — URINALYSIS, ROUTINE W REFLEX MICROSCOPIC
Bilirubin, UA: NEGATIVE
Glucose, UA: NEGATIVE
Ketones, UA: NEGATIVE
Leukocytes,UA: NEGATIVE
Nitrite, UA: NEGATIVE
Specific Gravity, UA: 1.01 (ref 1.005–1.030)
Urobilinogen, Ur: 0.2 mg/dL (ref 0.2–1.0)
pH, UA: 7 (ref 5.0–7.5)

## 2024-09-14 LAB — MICROSCOPIC EXAMINATION

## 2024-09-15 ENCOUNTER — Telehealth: Payer: Self-pay

## 2024-09-15 DIAGNOSIS — N390 Urinary tract infection, site not specified: Secondary | ICD-10-CM | POA: Diagnosis not present

## 2024-09-15 DIAGNOSIS — R319 Hematuria, unspecified: Secondary | ICD-10-CM | POA: Diagnosis not present

## 2024-09-15 MED ORDER — NITROFURANTOIN MONOHYD MACRO 100 MG PO CAPS: 100.0000 mg | ORAL_CAPSULE | Freq: Two times a day (BID) | ORAL | 0 refills | Status: DC

## 2024-09-15 NOTE — Telephone Encounter (Signed)
 Patient unable to leave urine specimen during 09/13/24 ov. Patient dropped off ua on 09/14/24. Ua reviewed by Dr. Sherrilee 09/15/24.   Urine culture ordered and Macrobid sent to pharmacy per Dr. Sherrilee.  Patient called and made aware.

## 2024-09-15 NOTE — Addendum Note (Signed)
 Addended by: MALACHY SLICE on: 09/15/2024 01:52 PM   Modules accepted: Orders

## 2024-09-17 ENCOUNTER — Encounter (HOSPITAL_COMMUNITY): Payer: Self-pay | Admitting: Emergency Medicine

## 2024-09-17 ENCOUNTER — Observation Stay (HOSPITAL_COMMUNITY)
Admission: EM | Admit: 2024-09-17 | Discharge: 2024-09-20 | Disposition: A | Discharge status: Home / Self Care | Attending: Internal Medicine | Admitting: Internal Medicine

## 2024-09-17 DIAGNOSIS — Q438 Other specified congenital malformations of intestine: Secondary | ICD-10-CM | POA: Diagnosis not present

## 2024-09-17 DIAGNOSIS — E66811 Obesity, class 1: Secondary | ICD-10-CM | POA: Diagnosis not present

## 2024-09-17 DIAGNOSIS — Z23 Encounter for immunization: Secondary | ICD-10-CM | POA: Insufficient documentation

## 2024-09-17 DIAGNOSIS — D539 Nutritional anemia, unspecified: Secondary | ICD-10-CM | POA: Insufficient documentation

## 2024-09-17 DIAGNOSIS — K625 Hemorrhage of anus and rectum: Principal | ICD-10-CM

## 2024-09-17 DIAGNOSIS — I4819 Other persistent atrial fibrillation: Secondary | ICD-10-CM | POA: Insufficient documentation

## 2024-09-17 DIAGNOSIS — D509 Iron deficiency anemia, unspecified: Secondary | ICD-10-CM | POA: Diagnosis not present

## 2024-09-17 DIAGNOSIS — N939 Abnormal uterine and vaginal bleeding, unspecified: Secondary | ICD-10-CM | POA: Diagnosis not present

## 2024-09-17 DIAGNOSIS — K921 Melena: Secondary | ICD-10-CM | POA: Insufficient documentation

## 2024-09-17 DIAGNOSIS — E039 Hypothyroidism, unspecified: Secondary | ICD-10-CM | POA: Diagnosis not present

## 2024-09-17 DIAGNOSIS — K922 Gastrointestinal hemorrhage, unspecified: Secondary | ICD-10-CM

## 2024-09-17 DIAGNOSIS — G4700 Insomnia, unspecified: Secondary | ICD-10-CM | POA: Insufficient documentation

## 2024-09-17 DIAGNOSIS — Z6830 Body mass index (BMI) 30.0-30.9, adult: Secondary | ICD-10-CM | POA: Diagnosis not present

## 2024-09-17 DIAGNOSIS — I110 Hypertensive heart disease with heart failure: Secondary | ICD-10-CM | POA: Insufficient documentation

## 2024-09-17 DIAGNOSIS — Z7901 Long term (current) use of anticoagulants: Secondary | ICD-10-CM | POA: Insufficient documentation

## 2024-09-17 DIAGNOSIS — K648 Other hemorrhoids: Secondary | ICD-10-CM | POA: Diagnosis not present

## 2024-09-17 DIAGNOSIS — K254 Chronic or unspecified gastric ulcer with hemorrhage: Secondary | ICD-10-CM | POA: Diagnosis not present

## 2024-09-17 DIAGNOSIS — N938 Other specified abnormal uterine and vaginal bleeding: Secondary | ICD-10-CM | POA: Diagnosis not present

## 2024-09-17 DIAGNOSIS — K449 Diaphragmatic hernia without obstruction or gangrene: Secondary | ICD-10-CM | POA: Diagnosis not present

## 2024-09-17 DIAGNOSIS — D62 Acute posthemorrhagic anemia: Secondary | ICD-10-CM

## 2024-09-17 DIAGNOSIS — E876 Hypokalemia: Secondary | ICD-10-CM | POA: Diagnosis not present

## 2024-09-17 DIAGNOSIS — I509 Heart failure, unspecified: Secondary | ICD-10-CM | POA: Insufficient documentation

## 2024-09-17 DIAGNOSIS — F1090 Alcohol use, unspecified, uncomplicated: Secondary | ICD-10-CM | POA: Insufficient documentation

## 2024-09-17 LAB — CBC WITH DIFFERENTIAL/PLATELET
Abs Immature Granulocytes: 0.07 K/uL (ref 0.00–0.07)
Basophils Absolute: 0 K/uL (ref 0.0–0.1)
Basophils Relative: 0 %
Eosinophils Absolute: 0.1 K/uL (ref 0.0–0.5)
Eosinophils Relative: 1 %
HCT: 27.9 % — ABNORMAL LOW (ref 36.0–46.0)
Hemoglobin: 9.1 g/dL — ABNORMAL LOW (ref 12.0–15.0)
Immature Granulocytes: 1 %
Lymphocytes Relative: 25 %
Lymphs Abs: 2.5 K/uL (ref 0.7–4.0)
MCH: 33.7 pg (ref 26.0–34.0)
MCHC: 32.6 g/dL (ref 30.0–36.0)
MCV: 103.3 fL — ABNORMAL HIGH (ref 80.0–100.0)
Monocytes Absolute: 0.7 K/uL (ref 0.1–1.0)
Monocytes Relative: 7 %
Neutro Abs: 6.7 K/uL (ref 1.7–7.7)
Neutrophils Relative %: 66 %
Platelets: 393 K/uL (ref 150–400)
RBC: 2.7 MIL/uL — ABNORMAL LOW (ref 3.87–5.11)
RDW: 15 % (ref 11.5–15.5)
WBC: 10.1 K/uL (ref 4.0–10.5)
nRBC: 0 % (ref 0.0–0.2)

## 2024-09-17 LAB — COMPREHENSIVE METABOLIC PANEL WITH GFR
ALT: 17 U/L (ref 0–44)
AST: 41 U/L (ref 15–41)
Albumin: 3.9 g/dL (ref 3.5–5.0)
Alkaline Phosphatase: 85 U/L (ref 38–126)
Anion gap: 13 (ref 5–15)
BUN: 20 mg/dL (ref 8–23)
CO2: 25 mmol/L (ref 22–32)
Calcium: 8.4 mg/dL — ABNORMAL LOW (ref 8.9–10.3)
Chloride: 97 mmol/L — ABNORMAL LOW (ref 98–111)
Creatinine, Ser: 1.11 mg/dL — ABNORMAL HIGH (ref 0.44–1.00)
GFR, Estimated: 48 mL/min — ABNORMAL LOW (ref >60)
Glucose, Bld: 107 mg/dL — ABNORMAL HIGH (ref 70–99)
Potassium: 3 mmol/L — ABNORMAL LOW (ref 3.5–5.1)
Sodium: 135 mmol/L (ref 135–145)
Total Bilirubin: 0.2 mg/dL (ref 0.0–1.2)
Total Protein: 7.1 g/dL (ref 6.5–8.1)

## 2024-09-17 LAB — PROTIME-INR
INR: 1 (ref 0.8–1.2)
Prothrombin Time: 13.9 s (ref 11.4–15.2)

## 2024-09-17 LAB — TYPE AND SCREEN
ABO/RH(D): O POS
Antibody Screen: NEGATIVE

## 2024-09-17 MED ORDER — PANTOPRAZOLE SODIUM 40 MG IV SOLR
40.0000 mg | INTRAVENOUS | Status: AC
  Administered 2024-09-17: 40 mg via INTRAVENOUS
  Filled 2024-09-17: qty 10

## 2024-09-17 NOTE — ED Triage Notes (Addendum)
 Pt finished radiation to vaginal/ rectum for cancer 1 month ago and had bleeding starting last night when she went out to dinner. Changing pad every hour with few clots. Lives alone. Grandson's wife here with her today. Has belly cramping but helps if takes tylenol . Been taking antibiotic for UTI. She is on eliquis .

## 2024-09-17 NOTE — ED Provider Notes (Signed)
 [facility] EMERGENCY DEPARTMENT AT [facility] [address] Provider Note   CSN: [id number] Arrival date & time: 09/17/24  2028     Patient presents with: Rectal Bleeding   [name] is a 88 y.o. female.  {Add pertinent medical, surgical, social history, OB history to YEP:[id number]}  Rectal Bleeding  This patient is a 88 year old female, she is on Eliquis  because of atrial fibrillation, she has a history of vaginal cancer and has recently undergone radiation of that area, she has been having some difficulty with melena and some rectal bleeding for which her gastroenterologist had recommended that she is here cardiologist for clearance for colonoscopy and endoscopy however she has not yet had that clearance.  Over the last couple of days she has had increased amounts of bright red blood per rectum and today had a significant amount.  She has had some abdominal cramping which seems to go away with Tylenol .  No nausea or vomiting.    Prior to Admission medications   Medication Sig Start Date End Date Taking? Authorizing Provider  acetaminophen  (TYLENOL ) 650 MG CR tablet Take 650 mg by mouth every 8 (eight) hours as needed for pain.    [provider]  albuterol  (VENTOLIN  HFA) 108 (90 Base) MCG/ACT inhaler INHALE 2 PUFFS INTO THE LUNGS EVERY 6 HOURS AS NEEDED FOR WHEEZING OR SHORTNESS OF BREATH 05/29/24   [name], MD  alendronate  (FOSAMAX ) 70 MG tablet TAKE 1 TABLET(70 MG) BY MOUTH EVERY 7 DAYS WITH A FULL GLASS OF WATER  AND ON AN EMPTY STOMACH 08/16/23   Luking, Glendia LABOR, MD  ALPRAZolam  (XANAX ) 0.5 MG tablet TAKE 1 TABLET BY MOUTH EVERY NIGHT AT BEDTIME. MAY TAKE 1/2 TABLET DURING THE DAY AS NEEDED 09/14/24   [name], MD  Cholecalciferol (VITAMIN D3) 2000 UNITS TABS Take 2,000 Units by mouth daily.    [provider]  ciprofloxacin  (CIPRO ) 500 MG tablet Take 1 tablet (500 mg total) by mouth 2 (two) times daily. 07/24/24   Cook, Jayce G, DO   diphenoxylate -atropine  (LOMOTIL ) 2.5-0.025 MG tablet Take 1 tablet by mouth 4 (four) times daily as needed for diarrhea or loose stools. Not to exceed 8 tablets per day 07/13/24   Shannon Agent, MD  ELIQUIS  5 MG TABS tablet TAKE 1 TABLET(5 MG) BY MOUTH TWICE DAILY. FOLLOW-UP 03/30/24   [name], Jerel, MD  indapamide  (LOZOL ) 1.25 MG tablet Take 1 tablet (1.25 mg total) by mouth every morning. 02/11/24   [name], MD  levothyroxine  (SYNTHROID ) 125 MCG tablet TAKE 1 TABLET BY MOUTH DAILY 08/25/24   [name], MD  magnesium gluconate (MAGONATE) 500 (27 Mg) MG TABS tablet Take 1 tablet (500 mg total) by mouth daily at 6 (six) AM. Patient taking differently: Take 500 mg by mouth daily at 6 (six) AM. As needed 08/28/24   [name], MD  metoprolol  succinate (TOPROL -XL) 25 MG 24 hr tablet Take 1 tablet (25 mg total) by mouth daily. 07/13/24   Hoskins, Carolyn C, NP  Multiple Vitamin (MULTI-VITAMIN DAILY PO) Take 1 tablet by mouth daily.    [provider]  Nintedanib (OFEV ) 100 MG CAPS Take 1 capsule (100 mg total) by mouth 2 (two) times daily. **dose change** 05/26/24   Ramaswamy, Murali, MD  nitrofurantoin, macrocrystal-monohydrate, (MACROBID) 100 MG capsule Take 1 capsule (100 mg total) by mouth 2 (two) times daily. 09/05/24   Cook, Jayce G, DO  nitrofurantoin, macrocrystal-monohydrate, (MACROBID) 100 MG capsule Take 1 capsule (100 mg  total) by mouth 2 (two) times daily. 09/15/24   McKenzie, Belvie CROME, MD  ondansetron  (ZOFRAN -ODT) 4 MG disintegrating tablet Take 1 tablet (4 mg total) by mouth every 8 (eight) hours as needed. 05/06/24   Leath-Warren, [name], NP  pantoprazole  (PROTONIX ) 40 MG tablet TAKE 1 TABLET(40 MG) BY MOUTH DAILY 07/24/24   Cook, Jayce G, DO  potassium chloride  (KLOR-CON  M) 20 MEQ tablet TAKE 1 TABLET (20 MEQ) BY MOUTH DAILY 08/28/24   [name], MD  pregabalin  (LYRICA ) 25 MG capsule Take 25 mg by mouth 2 (two) times daily. 08/24/24   [provider]  sertraline  (ZOLOFT ) 100 MG tablet TAKE 1 TABLET BY MOUTH EVERY DAY 02/21/24   Luking, Glendia LABOR, MD  Vibegron (GEMTESA) 75 MG TABS Take 1 tablet (75 mg total) by mouth daily. 09/13/24   Sherrilee Belvie CROME, MD    Allergies: Erythromycin, Aspirin, and Ibuprofen    Review of Systems  Gastrointestinal:  Positive for hematochezia.  All other systems reviewed and are negative.   Updated Vital Signs BP (!) 153/72 (BP Location: Right Arm)   Pulse 77   Temp 98.1 F (36.7 C) (Oral)   Resp 16   SpO2 98%   Physical Exam Vitals and nursing note reviewed.  Constitutional:      General: She is not in acute distress.    Appearance: She is well-developed.  HENT:     Head: Normocephalic and atraumatic.     Mouth/Throat:     Pharynx: No oropharyngeal exudate.  Eyes:     General: No scleral icterus.       Right eye: No discharge.        Left eye: No discharge.     Conjunctiva/sclera: Conjunctivae normal.     Pupils: Pupils are equal, round, and reactive to light.  Neck:     Thyroid : No thyromegaly.     Vascular: No JVD.  Cardiovascular:     Rate and Rhythm: Normal rate and regular rhythm.     Heart sounds: Normal heart sounds. No murmur heard.    No friction rub. No gallop.  Pulmonary:     Effort: Pulmonary effort is normal. No respiratory distress.     Breath sounds: Normal breath sounds. No wheezing or rales.  Abdominal:     General: Bowel sounds are normal. There is no distension.     Palpations: Abdomen is soft. There is no mass.     Tenderness: There is no abdominal tenderness.  Genitourinary:    Comments: Bright red blood per rectum, chaperone present Musculoskeletal:        General: No tenderness. Normal range of motion.     Cervical back: Normal range of motion and neck supple.     Right lower leg: No edema.     Left lower leg: No edema.  Lymphadenopathy:     Cervical: No cervical adenopathy.  Skin:    General: Skin is warm and dry.     Findings: No erythema or rash.   Neurological:     Mental Status: She is alert.     Coordination: Coordination normal.  Psychiatric:        Behavior: Behavior normal.     (all labs ordered are listed, but only abnormal results are displayed) Labs Reviewed - No data to display  EKG: None  Radiology: No results found.  {Document cardiac monitor, telemetry assessment procedure when appropriate:32947} Procedures   Medications Ordered in the ED - No data to display    {  Click here for ABCD2, HEART and other calculators REFRESH Note before signing:1}                              Medical Decision Making Amount and/or Complexity of Data Reviewed Labs: ordered.  Risk Prescription drug management.    This patient presents to the ED for concern of rectal bleeding, this involves an extensive number of treatment options, and is a complaint that carries with it a high risk of complications and morbidity.  The differential diagnosis includes reticulosis, internal hemorrhoids, arteriovenous malformation, upper GI bleed with rapid transit   Co morbidities / Chronic conditions that complicate the patient evaluation  Anticoagulated on Eliquis    Additional history obtained:  Additional history obtained from EMR External records from outside source obtained and reviewed including record including GI visits and family doctor visits   Lab Tests:  I Ordered, and personally interpreted labs.  The pertinent results include: Hemoglobin is dropped about 1 g over the last month   Cardiac Monitoring: / EKG:  The patient was maintained on a cardiac monitor.  I personally viewed and interpreted the cardiac monitored which showed an underlying rhythm of: Sinus rhythm   Problem List / ED Course / Critical interventions / Medication management  Gastrointestinal bleeding on Eliquis , hold anticoagulants, n.p.o. after midnight I ordered medication including pantoprazole  Reevaluation of the patient after these medicines  showed that the patient improved I have reviewed the patients home medicines and have made adjustments as needed   Consultations Obtained:  I requested consultation with the gastroenterologist Dr. Eartha,  and discussed lab and imaging findings as well as pertinent plan - they recommend: Seeing the patient in the morning, n.p.o. after midnight, possible scope Will discuss with hospitalist for admission   Social Determinants of Health:  Elderly, anticoagulated   Test / Admission - Considered:  Admit   {Document critical care time when appropriate  Document review of labs and clinical decision tools ie CHADS2VASC2, etc  Document your independent review of radiology images and any outside records  Document your discussion with family members, caretakers and with consultants  Document social determinants of health affecting pt's care  Document your decision making why or why not admission, treatments were needed:32947:::1}   Final diagnoses:  None    ED Discharge Orders     None

## 2024-09-17 NOTE — H&P (Incomplete)
 History and Physical    Patient: [name] FMW:[id number] DOB: 1936-10-29 DOA: 09/17/2024 DOS: the patient was seen and examined on 09/18/2024 PCP: [name], MD  Patient coming from: Home  Chief Complaint:  Chief Complaint  Patient presents with   Rectal Bleeding   HPI: [name] is a 88 y.o. female with medical history significant of atrial fibrillation on Eliquis , hypothyroidism, GERD, insomnia, vaginal cancer s/p radiation therapy.  She complained of dark stool recently, but complained of several days of vaginal bleeding and has to use several pads daily.  Patient complained of abdominal cramping as well and this usually resolve with Tylenol .  She complained of melena and some rectal bleeding as an outpatient and it was recommended for her to obtain cardiology consult for clearance for colonoscopy and endoscopy, but she has not been able to do this.  ED Course:  In the emergency department, BP was 143/78, other vital signs were within normal range.  Workup in the ED showed microcytic anemia.  BMP showed sodium 135, potassium 3.0, chloride 97, bicarb 25, blood glucose 107, BUN 20, creatinine 1.11, calcium 8.4. Gastroenterologist (Dr. Eartha) was consulted and will see patient in the morning.  Patient was treated with IV Protonix .  Review of Systems: Review of systems as noted in the HPI. All other systems reviewed and are negative.   Past Medical History:  Diagnosis Date   Arthritis    CHF (congestive heart failure) (HCC)    Chronic Leukemia    Depression    Dysrhythmia    A.fib   on Eliquis    H/O hiatal hernia    Hernia    Hernia    right inguinal hernia   Hypertension    Hypothyroidism    Leg swelling    Osteitis pubis 05/30/2001   Osteopenia 03/30/2009   Osteopenia 07/30/2022   Bone density 2021 showed elevated FRAX patient was started on Fosamax    Pneumonia    Thyroid  disease    Past Surgical History:  Procedure Laterality Date   ABDOMINAL  HYSTERECTOMY  03/1970   APPENDECTOMY  03/1966   BILATERAL SALPINGOOPHORECTOMY     CARDIOVERSION N/A 03/10/2022   Procedure: CARDIOVERSION;  Surgeon: Francyne Headland, MD;  Location: [facility];  Service: Cardiovascular;  Laterality: N/A;   CATARACT EXTRACTION W/PHACO Right 09/25/2014   Procedure: CATARACT EXTRACTION PHACO AND INTRAOCULAR LENS PLACEMENT (IOC);  Surgeon: Oneil T. Roz, MD;  Location: [facility];  Service: Ophthalmology;  Laterality: Right;  CDE:7.56   CATARACT EXTRACTION W/PHACO Left 10/09/2014   Procedure: CATARACT EXTRACTION PHACO AND INTRAOCULAR LENS PLACEMENT LEFT EYE CDE=6.51;  Surgeon: Oneil T. Roz, MD;  Location: [facility];  Service: Ophthalmology;  Laterality: Left;   CHOLECYSTECTOMY  1980   COLONOSCOPY  1999   COLONOSCOPY  March 2007   Repeat 10 years   COLONOSCOPY  11/06/2003    Internal and external hemorrhoids/Two tiny rectal distal sigmoid polyps cold biopsied/Otherwise within normal limits to the cecum   COLONOSCOPY N/A 08/29/2014   Dr. Shaaron: Sessile polyp was found at the cecum status post cold snare polypectomy, tubular adenoma. No further surveillance unless clinical status changes   CYSTOSCOPY N/A 05/24/2024   Procedure: CYSTOSCOPY;  Surgeon: Viktoria Comer SAUNDERS, MD;  Location: [facility];  Service: Gynecology;  Laterality: N/A;   ESOPHAGOGASTRODUODENOSCOPY  2007   Dr. Shaaron: normal esophagus, small tom oderate sized hiatal hernia, Cameron lesions   EXAM UNDER ANESTHESIA, PELVIC N/A 05/24/2024   Procedure: EXAM UNDER ANESTHESIA, PELVIC;  Surgeon:  Viktoria Comer SAUNDERS, MD;  Location: [facility];  Service: Gynecology;  Laterality: N/A;   IR IMAGING GUIDED PORT INSERTION  05/23/2024   SKIN CANCER EXCISION Left 11 1 2016   left arm squamous cancer   TUMOR REMOVAL  03/1968   ovarian    Social History:  reports that she has never smoked. She has never been exposed to tobacco smoke. She has never used smokeless tobacco. She reports current alcohol use. She reports that she does  not use drugs.   Allergies  Allergen Reactions   Erythromycin Shortness Of Breath   Aspirin Other (See Comments)    Caused internal bleeding. Patient had ulcer at the time. Instructed by PCP - Nancey not to take any longer.   Ibuprofen Other (See Comments)    Instructed not to take by PCP - Nancey due to reaction to aspirin as well (precautionary).    Family History  Problem Relation Age of Onset   Dementia Mother    Heart disease Father    Cancer Father        throat   Cancer Sister        breast   Breast cancer Sister    Lung cancer Grandson    Colon cancer Neg Hx    Lung disease Neg Hx    Autoimmune disease Neg Hx      Prior to Admission medications   Medication Sig Start Date End Date Taking? Authorizing Provider  acetaminophen  (TYLENOL ) 650 MG CR tablet Take 650 mg by mouth every 8 (eight) hours as needed for pain.    [provider]  albuterol  (VENTOLIN  HFA) 108 (90 Base) MCG/ACT inhaler INHALE 2 PUFFS INTO THE LUNGS EVERY 6 HOURS AS NEEDED FOR WHEEZING OR SHORTNESS OF BREATH 05/29/24   [name], MD  alendronate  (FOSAMAX ) 70 MG tablet TAKE 1 TABLET(70 MG) BY MOUTH EVERY 7 DAYS WITH A FULL GLASS OF WATER  AND ON AN EMPTY STOMACH 08/16/23   [name], MD  ALPRAZolam  (XANAX ) 0.5 MG tablet TAKE 1 TABLET BY MOUTH EVERY NIGHT AT BEDTIME. MAY TAKE 1/2 TABLET DURING THE DAY AS NEEDED 09/14/24   [name], MD  Cholecalciferol (VITAMIN D3) 2000 UNITS TABS Take 2,000 Units by mouth daily.    [provider]  ciprofloxacin  (CIPRO ) 500 MG tablet Take 1 tablet (500 mg total) by mouth 2 (two) times daily. 07/24/24   Cook, Jayce G, DO  diphenoxylate -atropine  (LOMOTIL ) 2.5-0.025 MG tablet Take 1 tablet by mouth 4 (four) times daily as needed for diarrhea or loose stools. Not to exceed 8 tablets per day 07/13/24   Shannon Agent, MD  ELIQUIS  5 MG TABS tablet TAKE 1 TABLET(5 MG) BY MOUTH TWICE DAILY. FOLLOW-UP 03/30/24   [name], Jerel, MD  indapamide  (LOZOL )  1.25 MG tablet Take 1 tablet (1.25 mg total) by mouth every morning. 02/11/24   [name], MD  levothyroxine  (SYNTHROID ) 125 MCG tablet TAKE 1 TABLET BY MOUTH DAILY 08/25/24   [name], MD  magnesium gluconate (MAGONATE) 500 (27 Mg) MG TABS tablet Take 1 tablet (500 mg total) by mouth daily at 6 (six) AM. Patient taking differently: Take 500 mg by mouth daily at 6 (six) AM. As needed 08/28/24   [name], MD  metoprolol  succinate (TOPROL -XL) 25 MG 24 hr tablet Take 1 tablet (25 mg total) by mouth daily. 07/13/24   Hoskins, Carolyn C, NP  Multiple Vitamin (MULTI-VITAMIN DAILY PO) Take 1 tablet by mouth daily.  [provider]  Nintedanib (OFEV ) 100 MG CAPS Take 1 capsule (100 mg total) by mouth 2 (two) times daily. **dose change** 05/26/24   Ramaswamy, Murali, MD  nitrofurantoin, macrocrystal-monohydrate, (MACROBID) 100 MG capsule Take 1 capsule (100 mg total) by mouth 2 (two) times daily. 09/05/24   Cook, Jayce G, DO  nitrofurantoin, macrocrystal-monohydrate, (MACROBID) 100 MG capsule Take 1 capsule (100 mg total) by mouth 2 (two) times daily. 09/15/24   McKenzie, Belvie CROME, MD  ondansetron  (ZOFRAN -ODT) 4 MG disintegrating tablet Take 1 tablet (4 mg total) by mouth every 8 (eight) hours as needed. 05/06/24   Leath-Warren, [name], NP  pantoprazole  (PROTONIX ) 40 MG tablet TAKE 1 TABLET(40 MG) BY MOUTH DAILY 07/24/24   Cook, Jayce G, DO  potassium chloride  (KLOR-CON  M) 20 MEQ tablet TAKE 1 TABLET (20 MEQ) BY MOUTH DAILY 08/28/24   [name], MD  pregabalin  (LYRICA ) 25 MG capsule Take 25 mg by mouth 2 (two) times daily. 08/24/24   [provider]  sertraline  (ZOLOFT ) 100 MG tablet TAKE 1 TABLET BY MOUTH EVERY DAY 02/21/24   Luking, Glendia LABOR, MD  Vibegron (GEMTESA) 75 MG TABS Take 1 tablet (75 mg total) by mouth daily. 09/13/24   McKenzie, Belvie CROME, MD    Physical Exam: BP (!) 142/91   Pulse 68   Temp 97.8 F (36.6 C) (Oral)   Resp (!) 22   SpO2 96%    General: 88 y.o. year-old female well developed well nourished in no acute distress.  Alert and oriented x3. HEENT: NCAT, EOMI Neck: Supple, trachea medial Cardiovascular: Regular rate and rhythm with no rubs or gallops.  No thyromegaly or JVD noted.  No lower extremity edema. 2/4 pulses in all 4 extremities. Respiratory: Clear to auscultation with no wheezes or rales. Good inspiratory effort. Abdomen: Soft, nontender nondistended with normal bowel sounds x4 quadrants. Muskuloskeletal: No cyanosis, clubbing or edema noted bilaterally Neuro: CN II-XII intact, strength 5/5 x 4, sensation, reflexes intact Skin: No ulcerative lesions noted or rashes Psychiatry: Judgement and insight appear normal. Mood is appropriate for condition and setting          Labs on Admission:  Basic Metabolic Panel: Recent Labs  Lab 09/17/24 2124  NA 135  K 3.0*  CL 97*  CO2 25  GLUCOSE 107*  BUN 20  CREATININE 1.11*  CALCIUM 8.4*   Liver Function Tests: Recent Labs  Lab 09/17/24 2124  AST 41  ALT 17  ALKPHOS 85  BILITOT 0.2  PROT 7.1  ALBUMIN 3.9   No results for input(s): LIPASE, AMYLASE in the last 168 hours. No results for input(s): AMMONIA in the last 168 hours. CBC: Recent Labs  Lab 09/17/24 2124 09/18/24 0527  WBC 10.1 9.3  NEUTROABS 6.7  --   HGB 9.1* 8.6*  HCT 27.9* 26.1*  MCV 103.3* 104.4*  PLT 393 321   Cardiac Enzymes: No results for input(s): CKTOTAL, CKMB, CKMBINDEX, TROPONINI in the last 168 hours.  BNP (last 3 results) No results for input(s): BNP in the last 8760 hours.  ProBNP (last 3 results) No results for input(s): PROBNP in the last 8760 hours.  CBG: No results for input(s): GLUCAP in the last 168 hours.  Radiological Exams on Admission: No results found.  EKG: I independently viewed the EKG done and my findings are as followed: EKG was not done in the ED  Assessment/Plan Present on Admission:  GI bleed  Hypokalemia   Persistent atrial fibrillation (HCC)  Acquired hypothyroidism  Principal Problem:   GI bleed Active Problems:   Acquired hypothyroidism   Persistent atrial fibrillation (HCC)   Hypokalemia   Vaginal bleeding   Macrocytic anemia   Hypocalcemia   Obesity, Class I, BMI 30-34.9   Insomnia   GI bleed H/H= 9.1/27.9, this was 9.7/30.6 on 09/08/2024 Bright red blood per rectum reported by EDP Continue IV Protonix  40 mg twice daily N.p.o. at midnight Gastroenterologist (Dr. Eartha) was consulted and will see patient in the morning per EDP  Vaginal bleed Patient reported vaginal bleed, she states this occurred after radiation therapy for vaginal cancer Consider gynecology consult in the morning  Hypokalemia K+ 3.0, this will be replenished  Macrocytic anemia MCV 103.3, hemoglobin 9.1, hematocrit 27.9 Folate level and vitamin B12 will be checked  Hypocalcemia Ca 8.4; continue Os-Cal when patient resumes oral intake  Obesity class I (BMI 30.48) Diet and lifestyle modification  Persistent atrial fibrillation  Eliquis  will be held at this time due to GI bleed Consider starting patient's Toprol -XL when she resumes oral intake  Acquired hypothyroidism Consider starting patient on Synthroid  when she resumes oral intake  Insomnia Continue on Xanax  nightly  DVT prophylaxis: SCDs  Code Status: Full code  Family Communication: Granddaughter at bedside (all questions answered to satisfaction)  Consults: Gastroenterology  Severity of Illness: The appropriate patient status for this patient is OBSERVATION. Observation status is judged to be reasonable and necessary in order to provide the required intensity of service to ensure the patient's safety. The patient's presenting symptoms, physical exam findings, and initial radiographic and laboratory data in the context of their medical condition is felt to place them at decreased risk for further clinical deterioration.  Furthermore, it is anticipated that the patient will be medically stable for discharge from the hospital within 2 midnights of admission.   Author: Posey Maier, DO 09/18/2024 5:52 AM  For on call review [email or web address].

## 2024-09-18 ENCOUNTER — Other Ambulatory Visit: Payer: Self-pay

## 2024-09-18 DIAGNOSIS — K625 Hemorrhage of anus and rectum: Secondary | ICD-10-CM | POA: Diagnosis not present

## 2024-09-18 DIAGNOSIS — E66811 Obesity, class 1: Secondary | ICD-10-CM | POA: Insufficient documentation

## 2024-09-18 DIAGNOSIS — N939 Abnormal uterine and vaginal bleeding, unspecified: Secondary | ICD-10-CM | POA: Insufficient documentation

## 2024-09-18 DIAGNOSIS — Z7901 Long term (current) use of anticoagulants: Secondary | ICD-10-CM

## 2024-09-18 DIAGNOSIS — D62 Acute posthemorrhagic anemia: Secondary | ICD-10-CM

## 2024-09-18 DIAGNOSIS — D539 Nutritional anemia, unspecified: Secondary | ICD-10-CM | POA: Insufficient documentation

## 2024-09-18 DIAGNOSIS — Z860101 Personal history of adenomatous and serrated colon polyps: Secondary | ICD-10-CM

## 2024-09-18 DIAGNOSIS — G4700 Insomnia, unspecified: Secondary | ICD-10-CM | POA: Insufficient documentation

## 2024-09-18 LAB — URINALYSIS, ROUTINE W REFLEX MICROSCOPIC
Bilirubin Urine: NEGATIVE
Glucose, UA: NEGATIVE mg/dL
Ketones, ur: NEGATIVE mg/dL
Nitrite: NEGATIVE
Protein, ur: 100 mg/dL — AB
RBC / HPF: >50 RBC/hpf (ref 0–5)
Specific Gravity, Urine: 1.019 (ref 1.005–1.030)
WBC, UA: >50 WBC/hpf (ref 0–5)
pH: 5 (ref 5.0–8.0)

## 2024-09-18 LAB — COMPREHENSIVE METABOLIC PANEL WITH GFR
ALT: 14 U/L (ref 0–44)
AST: 34 U/L (ref 15–41)
Albumin: 3.4 g/dL — ABNORMAL LOW (ref 3.5–5.0)
Alkaline Phosphatase: 71 U/L (ref 38–126)
Anion gap: 10 (ref 5–15)
BUN: 16 mg/dL (ref 8–23)
CO2: 27 mmol/L (ref 22–32)
Calcium: 8.2 mg/dL — ABNORMAL LOW (ref 8.9–10.3)
Chloride: 99 mmol/L (ref 98–111)
Creatinine, Ser: 0.95 mg/dL (ref 0.44–1.00)
GFR, Estimated: 58 mL/min — ABNORMAL LOW (ref >60)
Glucose, Bld: 95 mg/dL (ref 70–99)
Potassium: 3.2 mmol/L — ABNORMAL LOW (ref 3.5–5.1)
Sodium: 136 mmol/L (ref 135–145)
Total Bilirubin: 0.4 mg/dL (ref 0.0–1.2)
Total Protein: 6.4 g/dL — ABNORMAL LOW (ref 6.5–8.1)

## 2024-09-18 LAB — CBC
HCT: 26.1 % — ABNORMAL LOW (ref 36.0–46.0)
Hemoglobin: 8.6 g/dL — ABNORMAL LOW (ref 12.0–15.0)
MCH: 34.4 pg — ABNORMAL HIGH (ref 26.0–34.0)
MCHC: 33 g/dL (ref 30.0–36.0)
MCV: 104.4 fL — ABNORMAL HIGH (ref 80.0–100.0)
Platelets: 321 K/uL (ref 150–400)
RBC: 2.5 MIL/uL — ABNORMAL LOW (ref 3.87–5.11)
RDW: 14.9 % (ref 11.5–15.5)
WBC: 9.3 K/uL (ref 4.0–10.5)
nRBC: 0 % (ref 0.0–0.2)

## 2024-09-18 LAB — PHOSPHORUS: Phosphorus: 2.6 mg/dL (ref 2.5–4.6)

## 2024-09-18 LAB — MAGNESIUM: Magnesium: 1.5 mg/dL — ABNORMAL LOW (ref 1.7–2.4)

## 2024-09-18 LAB — VITAMIN B12: Vitamin B-12: 445 pg/mL (ref 180–914)

## 2024-09-18 LAB — FOLATE: Folate: 10.3 ng/mL (ref >5.9)

## 2024-09-18 MED ORDER — SODIUM CHLORIDE 0.9% FLUSH
10.0000 mL | Freq: Two times a day (BID) | INTRAVENOUS | Status: DC
  Administered 2024-09-18 – 2024-09-20 (×5): 10 mL

## 2024-09-18 MED ORDER — ACETAMINOPHEN 325 MG PO TABS
650.0000 mg | ORAL_TABLET | Freq: Four times a day (QID) | ORAL | Status: DC | PRN
  Administered 2024-09-18: 650 mg via ORAL
  Filled 2024-09-18 (×2): qty 2

## 2024-09-18 MED ORDER — ONDANSETRON HCL 4 MG/2ML IJ SOLN
4.0000 mg | Freq: Four times a day (QID) | INTRAMUSCULAR | Status: DC | PRN
  Administered 2024-09-18 – 2024-09-19 (×2): 4 mg via INTRAVENOUS
  Filled 2024-09-18 (×2): qty 2

## 2024-09-18 MED ORDER — MAGNESIUM SULFATE IN D5W 1-5 GM/100ML-% IV SOLN
1.0000 g | Freq: Once | INTRAVENOUS | Status: AC
  Administered 2024-09-18: 1 g via INTRAVENOUS
  Filled 2024-09-18: qty 100

## 2024-09-18 MED ORDER — POTASSIUM CHLORIDE 10 MEQ/100ML IV SOLN
10.0000 meq | INTRAVENOUS | Status: AC
  Administered 2024-09-18 (×3): 10 meq via INTRAVENOUS
  Filled 2024-09-18: qty 100

## 2024-09-18 MED ORDER — SODIUM CHLORIDE 0.9 % IV SOLN: INTRAVENOUS | Status: DC

## 2024-09-18 MED ORDER — INFLUENZA VAC SPLIT HIGH-DOSE 0.5 ML IM SUSY
0.5000 mL | PREFILLED_SYRINGE | INTRAMUSCULAR | Status: AC
  Administered 2024-09-19: 0.5 mL via INTRAMUSCULAR
  Filled 2024-09-18: qty 0.5

## 2024-09-18 MED ORDER — CHLORHEXIDINE GLUCONATE CLOTH 2 % EX PADS
6.0000 | MEDICATED_PAD | Freq: Every day | CUTANEOUS | Status: DC
  Administered 2024-09-18 – 2024-09-19 (×2): 6 via TOPICAL

## 2024-09-18 MED ORDER — ORAL CARE MOUTH RINSE: 15.0000 mL | OROMUCOSAL | Status: DC | PRN

## 2024-09-18 MED ORDER — PANTOPRAZOLE SODIUM 40 MG IV SOLR
40.0000 mg | Freq: Two times a day (BID) | INTRAVENOUS | Status: DC
  Administered 2024-09-18 – 2024-09-20 (×5): 40 mg via INTRAVENOUS
  Filled 2024-09-18 (×5): qty 10

## 2024-09-18 MED ORDER — ONDANSETRON HCL 4 MG PO TABS: 4.0000 mg | ORAL_TABLET | Freq: Four times a day (QID) | ORAL | Status: DC | PRN

## 2024-09-18 MED ORDER — SODIUM CHLORIDE 0.9% FLUSH: 10.0000 mL | INTRAVENOUS | Status: DC | PRN

## 2024-09-18 MED ORDER — ALPRAZOLAM 0.5 MG PO TABS
0.5000 mg | ORAL_TABLET | Freq: Every day | ORAL | Status: DC
Start: 2024-09-18 — End: 2024-09-20
  Administered 2024-09-19: 0.5 mg via ORAL
  Filled 2024-09-18: qty 1

## 2024-09-18 MED ORDER — ACETAMINOPHEN 650 MG RE SUPP: 650.0000 mg | Freq: Four times a day (QID) | RECTAL | Status: DC | PRN

## 2024-09-18 MED ORDER — POTASSIUM CHLORIDE 10 MEQ/100ML IV SOLN
10.0000 meq | INTRAVENOUS | Status: AC
  Administered 2024-09-18 (×3): 10 meq via INTRAVENOUS
  Filled 2024-09-18 (×3): qty 100

## 2024-09-18 MED ORDER — NA SULFATE-K SULFATE-MG SULF 17.5-3.13-1.6 GM/177ML PO SOLN
1.0000 | Freq: Once | ORAL | Status: AC
  Administered 2024-09-18: 354 mL via ORAL
  Filled 2024-09-18: qty 1

## 2024-09-18 NOTE — ED Notes (Addendum)
 Please call [name] due to pt's hearing impairment to update

## 2024-09-18 NOTE — ED Notes (Signed)
 Patient is resting comfortably.

## 2024-09-18 NOTE — Consult Note (Signed)
 Gastroenterology Consult   Referring Provider: Dr. Manfred  Primary Care Physician:  [name], MD Primary Gastroenterologist:  Dr. Shaaron   Patient ID: [name]; [id number]; [birth year]   Admit date: 09/17/2024  LOS: 0 days   Date of Consultation: 09/18/2024  Reason for Consultation:  rectal bleeding, hx of heme positive stool  History of Present Illness   [name] is an 88 y.o. year old female  with a history of stage IV vaginal cancer receiving chemoradiation and last radiation 08/25/24, , depression, pulmonary fibrosis, osteopenia, HTN, hypothyroidism, leukemia, CHF, and A-fib on Eliquis , presenting to the ED with vaginal bleeding and was also noted to have bright red blood per rectum on evaluation in the ED. GI consulted for rectal bleeding, anemia. She was actually seen on 09/13/24 by our office as outpatient in with plans for colonoscopy/EGD as outpatient due to reported melena and hematochezia.    On presentation: potassim 3.0, today 3.2. Receiving replacements.  Hgb 9.1 on admission, down to 8.6 this morning, elevated MVC, B12 445, folate 10.3. outside iron studies with iron sats 18, iron 51, ferritin 166. Baseline Hgb 8-10 range.   She has noted prior melena at last office visit and has also had vaginal bleeding intermittently. She was heme positive as outpatient. She believes she was having vaginal bleeding prior to presentation, and this is now tapering off to a dark brown. She has not seen bright red blood per rectum today. She feels sore in her rectum intermittently.  No abdominal pain. Takes tylenol  prn for abdominal cramping. Feels constipated. Last BM about 4 days ao. Took a laxative a few days ago.   Last dose of Eliquis : on Saturday morning.    Colonoscopy 2015: Sessile polyp in the cecum, remainder of colon normal, tubular adenoma. EGD 2007: normal esophagus, small to moderate sized hiatal hernia with cameron lesions   Past Medical History:   Diagnosis Date   Arthritis    CHF (congestive heart failure) (HCC)    Chronic Leukemia    Depression    Dysrhythmia    A.fib   on Eliquis    H/O hiatal hernia    Hernia    Hernia    right inguinal hernia   Hypertension    Hypothyroidism    Leg swelling    Osteitis pubis 05/30/2001   Osteopenia 03/30/2009   Osteopenia 07/30/2022   Bone density 2021 showed elevated FRAX patient was started on Fosamax    Pneumonia    Thyroid  disease     Past Surgical History:  Procedure Laterality Date   ABDOMINAL HYSTERECTOMY  03/1970   APPENDECTOMY  03/1966   BILATERAL SALPINGOOPHORECTOMY     CARDIOVERSION N/A 03/10/2022   Procedure: CARDIOVERSION;  Surgeon: Francyne Headland, MD;  Location: [facility];  Service: Cardiovascular;  Laterality: N/A;   CATARACT EXTRACTION W/PHACO Right 09/25/2014   Procedure: CATARACT EXTRACTION PHACO AND INTRAOCULAR LENS PLACEMENT (IOC);  Surgeon: Oneil T. Roz, MD;  Location: [facility];  Service: Ophthalmology;  Laterality: Right;  CDE:7.56   CATARACT EXTRACTION W/PHACO Left 10/09/2014   Procedure: CATARACT EXTRACTION PHACO AND INTRAOCULAR LENS PLACEMENT LEFT EYE CDE=6.51;  Surgeon: Oneil T. Roz, MD;  Location: [facility];  Service: Ophthalmology;  Laterality: Left;   CHOLECYSTECTOMY  1980   COLONOSCOPY  1999   COLONOSCOPY  March 2007   Repeat 10 years   COLONOSCOPY  11/06/2003    Internal and external hemorrhoids/Two tiny rectal distal sigmoid polyps cold biopsied/Otherwise within normal limits to the cecum  COLONOSCOPY N/A 08/29/2014   Dr. Shaaron: Sessile polyp was found at the cecum status post cold snare polypectomy, tubular adenoma. No further surveillance unless clinical status changes   CYSTOSCOPY N/A 05/24/2024   Procedure: CYSTOSCOPY;  Surgeon: Viktoria Comer SAUNDERS, MD;  Location: [facility];  Service: Gynecology;  Laterality: N/A;   ESOPHAGOGASTRODUODENOSCOPY  2007   Dr. Shaaron: normal esophagus, small tom oderate sized hiatal hernia, Cameron lesions   EXAM  UNDER ANESTHESIA, PELVIC N/A 05/24/2024   Procedure: EXAM UNDER ANESTHESIA, PELVIC;  Surgeon: Viktoria Comer SAUNDERS, MD;  Location: [facility];  Service: Gynecology;  Laterality: N/A;   IR IMAGING GUIDED PORT INSERTION  05/23/2024   SKIN CANCER EXCISION Left 11 1 2016   left arm squamous cancer   TUMOR REMOVAL  03/1968   ovarian    Prior to Admission medications   Medication Sig Start Date End Date Taking? Authorizing Provider  acetaminophen  (TYLENOL ) 650 MG CR tablet Take 650 mg by mouth every 8 (eight) hours as needed for pain.   Yes [provider]  albuterol  (VENTOLIN  HFA) 108 (90 Base) MCG/ACT inhaler INHALE 2 PUFFS INTO THE LUNGS EVERY 6 HOURS AS NEEDED FOR WHEEZING OR SHORTNESS OF BREATH 05/29/24  Yes Luking, Scott A, MD  alendronate  (FOSAMAX ) 70 MG tablet TAKE 1 TABLET(70 MG) BY MOUTH EVERY 7 DAYS WITH A FULL GLASS OF WATER  AND ON AN EMPTY STOMACH 08/16/23  Yes Luking, Scott A, MD  ALPRAZolam  (XANAX ) 0.5 MG tablet TAKE 1 TABLET BY MOUTH EVERY NIGHT AT BEDTIME. MAY TAKE 1/2 TABLET DURING THE DAY AS NEEDED 09/14/24  Yes Luking, Glendia LABOR, MD  Cholecalciferol (VITAMIN D3) 2000 UNITS TABS Take 2,000 Units by mouth daily.   Yes [provider]  diphenoxylate -atropine  (LOMOTIL ) 2.5-0.025 MG tablet Take 1 tablet by mouth 4 (four) times daily as needed for diarrhea or loose stools. Not to exceed 8 tablets per day 07/13/24  Yes Shannon Agent, MD  ELIQUIS  5 MG TABS tablet TAKE 1 TABLET(5 MG) BY MOUTH TWICE DAILY. FOLLOW-UP 03/30/24  Yes [name], Mihai, MD  levothyroxine  (SYNTHROID ) 125 MCG tablet TAKE 1 TABLET BY MOUTH DAILY 08/25/24  Yes Luking, Glendia LABOR, MD  magnesium gluconate (MAGONATE) 500 (27 Mg) MG TABS tablet Take 1 tablet (500 mg total) by mouth daily at 6 (six) AM. Patient taking differently: Take 500 mg by mouth daily at 6 (six) AM. As needed 08/28/24  Yes [name], MD  metoprolol  succinate (TOPROL -XL) 25 MG 24 hr tablet Take 1 tablet (25 mg total) by mouth daily. 07/13/24  Yes  Mauro Elveria BROCKS, NP  Multiple Vitamin (MULTI-VITAMIN DAILY PO) Take 1 tablet by mouth daily.   Yes [provider]  nitrofurantoin, macrocrystal-monohydrate, (MACROBID) 100 MG capsule Take 1 capsule (100 mg total) by mouth 2 (two) times daily. 09/15/24  Yes McKenzie, Belvie CROME, MD  ondansetron  (ZOFRAN -ODT) 4 MG disintegrating tablet Take 1 tablet (4 mg total) by mouth every 8 (eight) hours as needed. 05/06/24  Yes Leath-Warren, [name], NP  pantoprazole  (PROTONIX ) 40 MG tablet TAKE 1 TABLET(40 MG) BY MOUTH DAILY 07/24/24  Yes Cook, Jayce G, DO  potassium chloride  (KLOR-CON  M) 20 MEQ tablet TAKE 1 TABLET (20 MEQ) BY MOUTH DAILY 08/28/24  Yes [name], MD  pregabalin  (LYRICA ) 25 MG capsule Take 25 mg by mouth 2 (two) times daily. 08/24/24  Yes [provider]  sertraline  (ZOLOFT ) 100 MG tablet TAKE 1 TABLET BY MOUTH EVERY DAY 02/21/24  Yes [name], MD  Current Facility-Administered Medications  Medication Dose Route Frequency Provider Last Rate Last Admin   acetaminophen  (TYLENOL ) tablet 650 mg  650 mg Oral Q6H PRN Adefeso, Oladapo, DO   650 mg at 09/18/24 9241   Or   acetaminophen  (TYLENOL ) suppository 650 mg  650 mg Rectal Q6H PRN Adefeso, Oladapo, DO       [START ON 09/19/2024] Influenza vac split trivalent PF (FLUZONE  HIGH-DOSE) injection 0.5 mL  0.5 mL Intramuscular Tomorrow-1000 Sigdel, Santosh, MD       ondansetron  (ZOFRAN ) tablet 4 mg  4 mg Oral Q6H PRN Adefeso, Oladapo, DO       Or   ondansetron  (ZOFRAN ) injection 4 mg  4 mg Intravenous Q6H PRN Adefeso, Oladapo, DO       Oral care mouth rinse  15 mL Mouth Rinse PRN Sigdel, Santosh, MD       pantoprazole  (PROTONIX ) injection 40 mg  40 mg Intravenous Q12H Adefeso, Oladapo, DO   40 mg at 09/18/24 9076    Allergies as of 09/17/2024 - Review Complete 09/17/2024  Allergen Reaction Noted   Erythromycin Shortness Of Breath 04/15/2012   Aspirin Other (See Comments) 04/15/2012   Ibuprofen Other (See  Comments) 04/15/2012    Family History  Problem Relation Age of Onset   Dementia Mother    Heart disease Father    Cancer Father        throat   Cancer Sister        breast   Breast cancer Sister    Lung cancer Grandson    Colon cancer Neg Hx    Lung disease Neg Hx    Autoimmune disease Neg Hx     Social History   Socioeconomic History   Marital status: Widowed    Spouse name: Not on file   Number of children: 1   Years of education: 76   Highest education level: Not on file  Occupational History   Occupation: Retired    Associate Professor: RETIRED    Comment: Ship broker, 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   Widowed since 08/13/2021. Husband W.E. Muccio, died from Kidney Cancer.   Son-Tommy lives nearby.   2 grandson's deceased, Clint and Penne. Penne passed 02/2022 from Brain Cancer.    Social Drivers of Corporate investment banker Strain: Low Risk  (07/07/2024)   Overall Financial Resource Strain (CARDIA)    Difficulty of Paying Living Expenses: Not hard at all  Food Insecurity: No Food Insecurity (09/18/2024)   Hunger Vital Sign    Worried About Running Out of Food in the Last Year: Never true    Ran Out of Food in the Last Year: Never true  Transportation Needs: No Transportation Needs (09/18/2024)   PRAPARE - Administrator, Civil Service (Medical): No    Lack of Transportation (Non-Medical): No  Physical Activity: Inactive (07/07/2024)   Exercise Vital Sign    Days of Exercise per Week: 0 days  Minutes of  Exercise per Session: Not on file  Stress: No Stress Concern Present (07/07/2024)   [employer] of Occupational Health - Occupational Stress Questionnaire    Feeling of Stress: Only a little  Social Connections: Moderately Integrated (09/18/2024)   Social Connection and Isolation Panel    Frequency of Communication with Friends and Family: Twice a week    Frequency of Social Gatherings with Friends and Family: Twice a week    Attends Religious Services: More than 4 times per year    Active Member of [employer] or Organizations: Yes    Attends Banker Meetings: 1 to 4 times per year    Marital Status: Widowed  Intimate Partner Violence: Not At Risk (09/18/2024)   Humiliation, Afraid, Rape, and Kick questionnaire    Fear of Current or Ex-Partner: No    Emotionally Abused: No    Physically Abused: No    Sexually Abused: No     Review of Systems   See HPI  Physical Exam   Vital Signs in last 24 hours: Temp:  [97.6 F (36.4 C)-98.1 F (36.7 C)] 97.6 F (36.4 C) (10/20 0844) Pulse Rate:  [64-77] 68 (10/20 0844) Resp:  [15-25] 20 (10/20 0844) BP: (130-164)/(51-95) 144/88 (10/20 0844) SpO2:  [93 %-100 %] 96 % (10/20 0844) Weight:  [84.4 kg] 84.4 kg (10/20 0844) Last BM Date : 09/17/24 (patient states blood was in stool)  General:   Alert,  Well-developed, well-nourished, pleasant and cooperative in NAD Head:  Normocephalic and atraumatic. Eyes:  Sclera clear, no icterus.   Conjunctiva pink. Ears:  Normal auditory acuity. Mouth:  No deformity or lesions, dentition normal. Neck:  Supple; no masses Lungs:  Clear throughout to auscultation.   No wheezes, crackles, or rhonchi. No acute distress. Heart:  S1 S2 without murmurs Abdomen:  Soft, nontender and nondistended. No masses, hepatosplenomegaly or hernias noted. Normal bowel sounds, without guarding, and without rebound.   Rectal: deferred   Msk:  Symmetrical without gross deformities. Normal posture. Extremities:   Without clubbing or edema. Neurologic:  Alert and  oriented x4. Skin:  Intact without significant lesions or rashes. Psych:  Alert and cooperative. Normal mood and affect.  Intake/Output from previous day: No intake/output data recorded. Intake/Output this shift: No intake/output data recorded.   Labs/Studies   Recent Labs Recent Labs    09/17/24 2124 09/18/24 0527  WBC 10.1 9.3  HGB 9.1* 8.6*  HCT 27.9* 26.1*  PLT 393 321   BMET Recent Labs    09/17/24 2124 09/18/24 0527  NA 135 136  K 3.0* 3.2*  CL 97* 99  CO2 25 27  GLUCOSE 107* 95  BUN 20 16  CREATININE 1.11* 0.95  CALCIUM 8.4* 8.2*   LFT Recent Labs    09/17/24 2124 09/18/24 0527  PROT 7.1 6.4*  ALBUMIN 3.9 3.4*  AST 41 34  ALT 17 14  ALKPHOS 85 71  BILITOT 0.2 0.4   PT/INR Recent Labs    09/17/24 2124  LABPROT 13.9  INR 1.0      Assessment   CARTER KAMAN is an 88 y.o. year old female  with a history of stage IV vaginal cancer receiving chemoradiation and last radiation 08/25/24, depression, pulmonary fibrosis, osteopenia, HTN, hypothyroidism, leukemia, CHF, and A-fib on Eliquis , presenting to the ED with vaginal bleeding and was also noted to have bright red blood per rectum on evaluation in the ED. GI consulted for rectal bleeding, anemia. She was actually seen on  09/13/24 by our office as outpatient in with plans for colonoscopy/EGD as outpatient.   Hematochezia and melena: history of melena intermittently, along with intermittent hematochezia, on Eliquis  with last dose Saturday morning, with prior plans for colonoscopy/EGD as outpatient. As she has presented with hematochezia and known heme positive stool, can pursue colonoscopy/EGD while inpatient as she has already held Eliquis . Notably, she also reports vaginal bleeding, which is tapering off as well. Last colonoscopy 2015 with tubular adenoma and last EGD 2007 with Ole lesions. Suspect may have radiation proctitis as source for  low-volume hematochezia, less likely occult malignancy. Melena could be due to right-sided colonic lesion but most likely UGI source such as gastritis, cameron lesions, PUD,occult  AVMs, etc.   Anemia: multifactorial in setting of chemo, chronic disease, possible stuttering GI losses in setting of anticoagulation. Presenting Hgb 9.1, down to 8.6 this morning but no significant overt bleeding from rectum today and, no melena reported recently. I do note she has low iron sats and iron borderline low at 51, ferritin 166 but could be elevated as a reactant. Appears to have IDA component. Hgb historically in 12 range in June and previously, with drifting Hgb down to 10 in late June 2025 and coinciding with initiation of chemo. Suspect majority of anemia is stemming from onset of chemo but need to r/o GI etiology in setting of anticoagulation.  Last dose of Eliquis : on Saturday morning.      Plan / Recommendations    Continue to hold Eliquis  Suprep today PPI BID Clear liquids today, NPO after midnight, tap water  enemas in am Colonoscopy/EGD by Dr. Eartha on 10/21.      09/18/2024, 10:24 AM  Therisa MICAEL Stager, PhD, ANP-BC [facility]

## 2024-09-18 NOTE — ED Notes (Signed)
 PT ambulated to restroom. Small amount of frank blood on chux. Pt assisted in cleaning herself and back to bed.

## 2024-09-18 NOTE — Progress Notes (Signed)
 Mobility Specialist Progress Note:    09/18/24 0850  Mobility  Activity Ambulated with assistance  Level of Assistance Independent  Assistive Device [place]  Distance Ambulated (ft) 240 ft  Range of Motion/Exercises Active;All extremities  Activity Response Tolerated well  Mobility Referral Yes  Mobility visit 1 Mobility  Mobility Specialist Start Time (ACUTE ONLY) 0850  Mobility Specialist Stop Time (ACUTE ONLY) 0910  Mobility Specialist Time Calculation (min) (ACUTE ONLY) 20 min   Pt received sitting EOB, agreeable to mobility. Independently able to stand and ambulate with no AD. Tolerated well, asx throughout. Left supine, all needs met.  Desiray Orchard Mobility Specialist Please contact via Special educational needs teacher or  Rehab office at [phone]

## 2024-09-18 NOTE — TOC CM/SW Note (Signed)
 Transition of Care [facility]) - Inpatient Brief Assessment   Patient Details  Name: [name] MRN: [id number] Date of Birth: [birth year]  Transition of [facility]) CM/SW Contact:    Noreen KATHEE Cleotilde ISRAEL Phone Number: 09/18/2024, 9:01 AM   Clinical Narrative:  Inpatient Care Management (ICM) has reviewed patient and no other ICM needs have been identified at this time. We will continue to monitor patient advancement through interdisciplinary progression rounds. If new patient transition needs arise, please place a ICM consult.  Transition of Care Asessment: Insurance and Status: Insurance coverage has been reviewed Patient has primary care physician: Yes Home environment has been reviewed: Single Family Home Prior level of function:: Independent Prior/Current Home Services: No current home services Social Drivers of Health Review: SDOH reviewed no interventions necessary Readmission risk has been reviewed: Yes Transition of care needs: no transition of care needs at this time

## 2024-09-18 NOTE — Progress Notes (Signed)
 PROGRESS NOTE    [name]  FMW:[id number] DOB: 1936-05-04 DOA: 09/17/2024 PCP: [name], MD   Brief Narrative:    [name] is a 88 y.o. female with medical history significant of atrial fibrillation on Eliquis , hypothyroidism, GERD, insomnia, vaginal cancer s/p radiation therapy.  She complained of dark stool recently, but complained of several days of vaginal bleeding and has to use several pads daily.  Patient complained of abdominal cramping as well and this usually resolve with Tylenol .  She has had melena and rectal bleeding and was seen by GI outpatient with plan for colonoscopy and endoscopy which has not been completed yet.   Patient is admitted due to concern for GI bleeding/vaginal bleeding   Assessment & Plan:   Principal Problem:   GI bleed Active Problems:   Acquired hypothyroidism   Persistent atrial fibrillation (HCC)   Hypokalemia   Vaginal bleeding   Macrocytic anemia   Hypocalcemia   Obesity, Class I, BMI 30-34.9   Insomnia  GI bleed H/H= 9.1/27.9, this was 9.7/30.6 on 09/08/2024 Bright red blood per rectum reported by EDP Continue IV Protonix  40 mg twice daily Keep n.p.o., GI consult pending.   Vaginal bleed Patient reported vaginal bleed, she states this occurred after radiation therapy for vaginal cancer. Consider gynecology consult likely outpatient unless she bleeds significantly.  Will monitor H&H.  Acute on chronic anemia: Secondary to above aggravated by anticoagulation therapy.  Monitor H&H every 12 hours.   Hypokalemia Hypomagnesemia: Replace as needed   Hypocalcemia Ca 8.4; continue Os-Cal when patient resumes oral intake   Obesity class I (BMI 30.48) Diet and lifestyle modification   Persistent atrial fibrillation  Eliquis  will be held at this time due to GI bleed/Vaginal bleed Consider starting patient's Toprol -XL when she resumes oral intake Can utilize IV metoprolol  as needed.   Acquired hypothyroidism Start  Synthroid   when she resumes oral intake   Insomnia Continue on Xanax  nightly   DVT prophylaxis: SCDs   Code Status: Full code   Family Communication:    Consults: Gastroenterology   Severity of Illness: The appropriate patient status for this patient is OBSERVATION. Observation status is judged to be reasonable and necessary in order to provide the required intensity of service to ensure the patient's safety. The patient's presenting symptoms, physical exam findings, and initial radiographic and laboratory data in the context of their medical condition is felt to place them at decreased risk for further clinical deterioration. Furthermore, it is anticipated that the patient will be medically stable for discharge from the hospital within 2 midnights of admission.     Subjective: Patient seen and examined at the bedside.  She is hungry.  Denies any bowel movement today.  Reports of some blood from vagina but significantly improved compared to past few days.  Her hemoglobin and hematocrit has trended down  Objective: Vitals:   09/18/24 0530 09/18/24 0800 09/18/24 0844 09/18/24 1419  BP: (!) 160/58 (!) 130/51 (!) 144/88 (!) 166/72  Pulse: 66 70 68 70  Resp:   20   Temp:   97.6 F (36.4 C) (!) 97.3 F (36.3 C)  TempSrc:    Oral  SpO2: 96% 100% 96% 99%  Weight:   84.4 kg   Height:   5' 5.5 (1.664 m)    No intake or output data in the 24 hours ending 09/18/24 1503 Filed Weights   09/18/24 0844  Weight: 84.4 kg    Examination:  sonally reviewed following labs  and imaging studies  CBC: Recent Labs  Lab 09/17/24 2124 09/18/24 0527  WBC 10.1 9.3  NEUTROABS 6.7  --   HGB 9.1* 8.6*  HCT 27.9* 26.1*  MCV 103.3* 104.4*  PLT 393 321   Basic Metabolic Panel: Recent Labs  Lab 09/17/24 2124 09/18/24 0527  NA 135 136  K 3.0* 3.2*  CL 97* 99  CO2 25 27  GLUCOSE 107* 95  BUN 20 16  CREATININE 1.11* 0.95  CALCIUM 8.4* 8.2*  MG  --  1.5*  PHOS  --  2.6    GFR: Estimated Creatinine Clearance: 46.1 mL/min (by C-G formula based on SCr of 0.95 mg/dL). Liver Function Tests: Recent Labs  Lab 09/17/24 2124 09/18/24 0527  AST 41 34  ALT 17 14  ALKPHOS 85 71  BILITOT 0.2 0.4  PROT 7.1 6.4*  ALBUMIN 3.9 3.4*   No results for input(s): LIPASE, AMYLASE in the last 168 hours. No results for input(s): AMMONIA in the last 168 hours. Coagulation Profile: Recent Labs  Lab 09/17/24 2124  INR 1.0   Cardiac Enzymes: No results for input(s): CKTOTAL, CKMB, CKMBINDEX, TROPONINI in the last 168 hours. BNP (last 3 results) No results for input(s): PROBNP in the last 8760 hours. HbA1C: No results for input(s): HGBA1C in the last 72 hours. CBG: No results for input(s): GLUCAP in the last 168 hours. Lipid Profile: No results for input(s): CHOL, HDL, LDLCALC, TRIG, CHOLHDL, LDLDIRECT in the last 72 hours. Thyroid  Function Tests: No results for input(s): TSH, T4TOTAL, FREET4, T3FREE, THYROIDAB in the last 72 hours. Anemia Panel: Recent Labs    09/18/24 0527  VITAMINB12 445  FOLATE 10.3   Sepsis Labs: No results for input(s): PROCALCITON, LATICACIDVEN in the last 168 hours.  Recent Results (from the past 240 hours)  Microscopic Examination     Status: Abnormal   Collection Time: 09/14/24  8:52 AM   Urine  Result Value Ref Range Status   WBC, UA 6-10 (A) 0 - 5 /hpf Final   RBC, Urine 11-30 (A) 0 - 2 /hpf Final   Epithelial Cells (non renal) 0-10 0 - 10 /hpf Final   Bacteria, UA Few (A) None seen/Few Final         Radiology Studies: No results found.      Scheduled Meds:  ALPRAZolam   0.5 mg Oral QHS   Chlorhexidine  Gluconate Cloth  6 each Topical Daily   [START ON 09/19/2024] Influenza vac split trivalent PF  0.5 mL Intramuscular Tomorrow-1000   pantoprazole  (PROTONIX ) IV  40 mg Intravenous Q12H   sodium chloride  flush  10-40 mL Intracatheter Q12H   Continuous Infusions:   potassium chloride  10 mEq (09/18/24 1419)          Derryl Duval, MD [facility] 09/18/2024, 3:03 PM

## 2024-09-18 NOTE — Plan of Care (Signed)
   Problem: Education: Goal: Knowledge of General Education information will improve Description Including pain rating scale, medication(s)/side effects and non-pharmacologic comfort measures Outcome: Progressing   Problem: Health Behavior/Discharge Planning: Goal: Ability to manage health-related needs will improve Outcome: Progressing

## 2024-09-18 NOTE — Plan of Care (Signed)
   Problem: Education: Goal: Knowledge of General Education information will improve Description: Including pain rating scale, medication(s)/side effects and non-pharmacologic comfort measures Outcome: Progressing   Problem: Clinical Measurements: Goal: Ability to maintain clinical measurements within normal limits will improve Outcome: Progressing Goal: Diagnostic test results will improve Outcome: Progressing

## 2024-09-19 ENCOUNTER — Other Ambulatory Visit: Payer: Self-pay

## 2024-09-19 ENCOUNTER — Encounter (HOSPITAL_COMMUNITY): Payer: Self-pay | Admitting: Internal Medicine

## 2024-09-19 ENCOUNTER — Observation Stay (HOSPITAL_COMMUNITY): Admitting: Certified Registered"

## 2024-09-19 ENCOUNTER — Encounter (HOSPITAL_COMMUNITY)
Admission: EM | Disposition: A | Discharge status: Home / Self Care | Payer: Self-pay | Source: Home / Self Care | Attending: Emergency Medicine

## 2024-09-19 ENCOUNTER — Telehealth (INDEPENDENT_AMBULATORY_CARE_PROVIDER_SITE_OTHER): Payer: Self-pay | Admitting: Gastroenterology

## 2024-09-19 DIAGNOSIS — I5032 Chronic diastolic (congestive) heart failure: Secondary | ICD-10-CM | POA: Diagnosis not present

## 2024-09-19 DIAGNOSIS — K449 Diaphragmatic hernia without obstruction or gangrene: Secondary | ICD-10-CM

## 2024-09-19 DIAGNOSIS — K625 Hemorrhage of anus and rectum: Secondary | ICD-10-CM

## 2024-09-19 DIAGNOSIS — I499 Cardiac arrhythmia, unspecified: Secondary | ICD-10-CM | POA: Diagnosis not present

## 2024-09-19 DIAGNOSIS — I110 Hypertensive heart disease with heart failure: Secondary | ICD-10-CM

## 2024-09-19 DIAGNOSIS — Q438 Other specified congenital malformations of intestine: Secondary | ICD-10-CM | POA: Diagnosis not present

## 2024-09-19 DIAGNOSIS — K254 Chronic or unspecified gastric ulcer with hemorrhage: Secondary | ICD-10-CM | POA: Diagnosis not present

## 2024-09-19 HISTORY — PX: ESOPHAGOGASTRODUODENOSCOPY: SHX5428

## 2024-09-19 HISTORY — PX: COLONOSCOPY: SHX5424

## 2024-09-19 LAB — HEMOGLOBIN AND HEMATOCRIT, BLOOD
HCT: 27.6 % — ABNORMAL LOW (ref 36.0–46.0)
Hemoglobin: 9 g/dL — ABNORMAL LOW (ref 12.0–15.0)

## 2024-09-19 LAB — URINE CULTURE

## 2024-09-19 SURGERY — COLONOSCOPY
Anesthesia: General

## 2024-09-19 MED ORDER — PROPOFOL 500 MG/50ML IV EMUL
INTRAVENOUS | Status: DC | PRN
  Administered 2024-09-19: 125 ug/kg/min via INTRAVENOUS
  Administered 2024-09-19: 60 mg via INTRAVENOUS
  Administered 2024-09-19: 30 mg via INTRAVENOUS

## 2024-09-19 MED ORDER — LEVOTHYROXINE SODIUM 125 MCG PO TABS
125.0000 ug | ORAL_TABLET | Freq: Every day | ORAL | Status: DC
  Administered 2024-09-19 – 2024-09-20 (×2): 125 ug via ORAL
  Filled 2024-09-19 (×2): qty 1

## 2024-09-19 MED ORDER — HYDROCORTISONE ACETATE 25 MG RE SUPP
25.0000 mg | Freq: Two times a day (BID) | RECTAL | Status: DC
  Administered 2024-09-19 – 2024-09-20 (×2): 25 mg via RECTAL
  Filled 2024-09-19 (×2): qty 1

## 2024-09-19 MED ORDER — PREGABALIN 25 MG PO CAPS
25.0000 mg | ORAL_CAPSULE | Freq: Two times a day (BID) | ORAL | Status: DC
  Administered 2024-09-19 – 2024-09-20 (×3): 25 mg via ORAL
  Filled 2024-09-19 (×3): qty 1

## 2024-09-19 MED ORDER — ALBUTEROL SULFATE (2.5 MG/3ML) 0.083% IN NEBU: 2.5000 mg | INHALATION_SOLUTION | Freq: Four times a day (QID) | RESPIRATORY_TRACT | Status: DC | PRN

## 2024-09-19 MED ORDER — POTASSIUM CHLORIDE CRYS ER 20 MEQ PO TBCR
20.0000 meq | EXTENDED_RELEASE_TABLET | Freq: Every day | ORAL | Status: DC
  Administered 2024-09-19 – 2024-09-20 (×2): 20 meq via ORAL
  Filled 2024-09-19 (×2): qty 1

## 2024-09-19 MED ORDER — LACTATED RINGERS IV SOLN: INTRAVENOUS | Status: DC | PRN

## 2024-09-19 MED ORDER — SERTRALINE HCL 50 MG PO TABS
100.0000 mg | ORAL_TABLET | Freq: Every day | ORAL | Status: DC
  Administered 2024-09-19 – 2024-09-20 (×2): 100 mg via ORAL
  Filled 2024-09-19 (×2): qty 2

## 2024-09-19 MED ORDER — METOPROLOL SUCCINATE ER 25 MG PO TB24
25.0000 mg | ORAL_TABLET | Freq: Every day | ORAL | Status: DC
  Administered 2024-09-19 – 2024-09-20 (×2): 25 mg via ORAL
  Filled 2024-09-19 (×2): qty 1

## 2024-09-19 MED ORDER — LIDOCAINE 2% (20 MG/ML) 5 ML SYRINGE
INTRAMUSCULAR | Status: DC | PRN
  Administered 2024-09-19: 60 mg via INTRAVENOUS

## 2024-09-19 NOTE — Care Management Obs Status (Signed)
 MEDICARE OBSERVATION STATUS NOTIFICATION   Patient Details  Name: [name] MRN: [id number] Date of Birth: June 03, 1936   Medicare Observation Status Notification Given:  Yes    Duwaine LITTIE Ada 09/19/2024, 10:10 AM

## 2024-09-19 NOTE — Anesthesia Postprocedure Evaluation (Signed)
 Anesthesia Post Note  Patient: TAMERRA MERKLEY  Procedure(s) Performed: COLONOSCOPY EGD (ESOPHAGOGASTRODUODENOSCOPY)  Patient location during evaluation: Phase II Anesthesia Type: General Level of consciousness: awake Pain management: pain level controlled Vital Signs Assessment: post-procedure vital signs reviewed and stable Respiratory status: spontaneous breathing and respiratory function stable Cardiovascular status: blood pressure returned to baseline and stable Postop Assessment: no headache and no apparent nausea or vomiting Anesthetic complications: no Comments: Late entry   No notable events documented.   Last Vitals:  Vitals:   09/19/24 1545 09/19/24 1600  BP: 130/87 133/81  Pulse: (!) 101 (!) 102  Resp: 15 18  Temp:  36.6 C  SpO2: 100% 96%    Last Pain:  Vitals:   09/19/24 1600  TempSrc: Oral  PainSc:                  Yvonna JINNY Bosworth

## 2024-09-19 NOTE — Anesthesia Preprocedure Evaluation (Signed)
 Anesthesia Evaluation  Patient identified by MRN, date of birth, ID band Patient awake    Reviewed: Allergy & Precautions, H&P , NPO status , Patient's Chart, lab work & pertinent test results, reviewed documented beta blocker date and time   Airway Mallampati: II  TM Distance: >3 FB Neck ROM: full    Dental no notable dental hx.    Pulmonary pneumonia   Pulmonary exam normal breath sounds clear to auscultation       Cardiovascular Exercise Tolerance: Good hypertension, +CHF  + dysrhythmias  Rhythm:regular Rate:Normal     Neuro/Psych  PSYCHIATRIC DISORDERS  Depression     Neuromuscular disease    GI/Hepatic Neg liver ROS, hiatal hernia,GERD  ,,  Endo/Other  Hypothyroidism    Renal/GU negative Renal ROS  negative genitourinary   Musculoskeletal   Abdominal   Peds  Hematology  (+) Blood dyscrasia, anemia   Anesthesia Other Findings   Reproductive/Obstetrics negative OB ROS                              Anesthesia Physical Anesthesia Plan  ASA: 3  Anesthesia Plan: General   Post-op Pain Management:    Induction:   PONV Risk Score and Plan: Propofol  infusion  Airway Management Planned:   Additional Equipment:   Intra-op Plan:   Post-operative Plan:   Informed Consent: I have reviewed the patients History and Physical, chart, labs and discussed the procedure including the risks, benefits and alternatives for the proposed anesthesia with the patient or authorized representative who has indicated his/her understanding and acceptance.     Dental Advisory Given  Plan Discussed with: CRNA  Anesthesia Plan Comments:         Anesthesia Quick Evaluation

## 2024-09-19 NOTE — Transfer of Care (Addendum)
 Immediate Anesthesia Transfer of Care Note  Patient: [name]  Procedure(s) Performed: COLONOSCOPY EGD (ESOPHAGOGASTRODUODENOSCOPY)  Patient Location: PACU  Anesthesia Type:General  Level of Consciousness: drowsy and patient cooperative  Airway & Oxygen Therapy: Patient Spontanous Breathing and Patient connected to nasal cannula oxygen  Post-op Assessment: Report given to RN and Post -op Vital signs reviewed and stable  Post vital signs: Reviewed and stable  Last Vitals:  Vitals Value Taken Time  BP 112/68 09/19/24 15:32  Temp 36.7 09/19/24   15:33  Pulse 110 09/19/24 15:33  Resp 21 09/19/24 15:33  SpO2 98 % 09/19/24 15:33  Vitals shown include unfiled device data.  Last Pain:  Vitals:   09/19/24 1443  TempSrc:   PainSc: 0-No pain      Patients Stated Pain Goal: 6 (09/19/24 1354)  Complications: No notable events documented.

## 2024-09-19 NOTE — Telephone Encounter (Signed)
 noted

## 2024-09-19 NOTE — Op Note (Signed)
 [facility] Patient Name: [name] Procedure Date: 09/19/2024 2:29 PM MRN: [id number] Date of Birth: June 06, 1936 Attending MD: Toribio Fortune , , [id number] CSN: [id number] Age: 88 Admit Type: Inpatient Procedure:                Colonoscopy Indications:              Rectal bleeding Providers:                Toribio Fortune, Rosina Sprague, Daphne Mulch                            Technician, Technician Referring MD:              Medicines:                Monitored Anesthesia Care Complications:            No immediate complications. Estimated Blood Loss:     Estimated blood loss: none. Procedure:                Pre-Anesthesia Assessment:                           - Prior to the procedure, a History and Physical                            was performed, and patient medications, allergies                            and sensitivities were reviewed. The patient's                            tolerance of previous anesthesia was reviewed.                           - The risks and benefits of the procedure and the                            sedation options and risks were discussed with the                            patient. All questions were answered and informed                            consent was obtained.                           - ASA Grade Assessment: II - A patient with mild                            systemic disease.                           After obtaining informed consent, the colonoscope                            was passed under direct vision. Throughout the  procedure, the patient's blood pressure, pulse, and                            oxygen saturations were monitored continuously. The                            [id number]-[id number] ([id number]) Peds Colon was introduced                            through the anus and advanced to the the cecum,                            identified by appendiceal orifice and ileocecal                             valve. The colonoscopy was performed without                            difficulty. The patient tolerated the procedure                            well. The quality of the bowel preparation was                            excellent. Scope In: 2:58:47 PM Scope Out: 3:21:00 PM Scope Withdrawal Time: 0 hours 7 minutes 55 seconds  Total Procedure Duration: 0 hours 22 minutes 13 seconds  Findings:      The perianal and digital rectal examinations were normal.      The sigmoid colon was significantly tortuous.      Non-bleeding internal hemorrhoids were found during retroflexion. The       hemorrhoids were medium-sized. Upon scope withdrawal, it ws noticed the       patient had a gush of fresh blood. Resinpection showed a clot adhered to       internal hemorrhoids. Impression:               - Tortuous colon.                           - Non-bleeding internal hemorrhoids.                           - No specimens collected. Moderate Sedation:      Per Anesthesia Care Recommendation:           - Return patient to hospital ward for ongoing care.                            May be discharged home today.                           - Resume previous diet.                           - Repeat colonoscopy is not recommended due to  current age (70 years or older) for screening                            purposes.                           - Anusol suppositories twice a day for 7 days.                           - Start daily Miralax                           - Can restart Eliquis  tomorrow. Procedure Code(s):        --- Professional ---                           [phone], Colonoscopy, flexible; diagnostic, including                            collection of specimen(s) by brushing or washing,                            when performed (separate procedure) Diagnosis Code(s):        --- Professional ---                           K64.8, Other hemorrhoids                           K62.5,  Hemorrhage of anus and rectum                           Q43.8, Other specified congenital malformations of                            intestine CPT copyright 2022 American Medical Association. All rights reserved. The codes documented in this report are preliminary and upon coder review may  be revised to meet current compliance requirements. Toribio Fortune, MD Toribio Fortune,  09/19/2024 3:33:49 PM This report has been signed electronically. Number of Addenda: 0

## 2024-09-19 NOTE — Plan of Care (Signed)
  Problem: Education: Goal: Knowledge of General Education information will improve Description: Including pain rating scale, medication(s)/side effects and non-pharmacologic comfort measures Outcome: Adequate for Discharge   Problem: Clinical Measurements: Goal: Ability to maintain clinical measurements within normal limits will improve Outcome: Adequate for Discharge Goal: Diagnostic test results will improve Outcome: Adequate for Discharge

## 2024-09-19 NOTE — Progress Notes (Signed)
 We will proceed with EGD and colonoscopy as scheduled.  I thoroughly discussed with the patient the procedure, including the risks involved. Patient understands what the procedure involves including the benefits and any risks. Patient understands alternatives to the proposed procedure. Risks including (but not limited to) bleeding, tearing of the lining (perforation), rupture of adjacent organs, problems with heart and lung function, infection, and medication reactions. A small percentage of complications may require surgery, hospitalization, repeat endoscopic procedure, and/or transfusion.  Patient understood and agreed.  Katrinka Blazing, MD Gastroenterology and Hepatology [facility] Gastroenterology

## 2024-09-19 NOTE — Telephone Encounter (Signed)
 Pt is currently admitted to hospital and will be having TCS/EGD while inpatient. FYI

## 2024-09-19 NOTE — Op Note (Signed)
 [facility] Patient Name: [name] Procedure Date: 09/19/2024 2:34 PM MRN: [id number] Date of Birth: [birth year] Attending MD: Toribio Fortune , , [id number] CSN: [id number] Age: 88 Admit Type: Inpatient Procedure:                Upper GI endoscopy Indications:              Melena Providers:                Toribio Fortune, Rosina Sprague, Daphne Mulch                            Technician, Technician Referring MD:              Medicines:                Monitored Anesthesia Care Complications:            No immediate complications. Estimated Blood Loss:     Estimated blood loss: none. Procedure:                Pre-Anesthesia Assessment:                           - Prior to the procedure, a History and Physical                            was performed, and patient medications, allergies                            and sensitivities were reviewed. The patient's                            tolerance of previous anesthesia was reviewed.                           - The risks and benefits of the procedure and the                            sedation options and risks were discussed with the                            patient. All questions were answered and informed                            consent was obtained.                           - ASA Grade Assessment: II - A patient with mild                            systemic disease.                           After obtaining informed consent, the endoscope was                            passed under direct vision. Throughout the  procedure, the patient's blood pressure, pulse, and                            oxygen saturations were monitored continuously. The                            [id number] ([id number]) Upper was introduced through                            the mouth, and advanced to the second part of                            duodenum. The upper GI endoscopy was accomplished                             without difficulty. The patient tolerated the                            procedure well. Scope In: 2:51:17 PM Scope Out: 2:54:13 PM Total Procedure Duration: 0 hours 2 minutes 56 seconds  Findings:      A 9 cm hiatal hernia with a few Cameron ulcers was found. The proximal       extent of the gastric folds (end of tubular esophagus) was 39 cm from       the incisors. The hiatal narrowing was 39 cm from the incisors. The       Z-line was 30 cm from the incisors.      The stomach was normal.      The examined duodenum was normal. Impression:               - 9 cm hiatal hernia with a few Cameron ulcers.                           - Normal stomach.                           - Normal examined duodenum.                           - No specimens collected. Moderate Sedation:      Per Anesthesia Care Recommendation:           - Use Protonix  (pantoprazole ) 40 mg PO BID.                           - Proceed with colonoscopy Procedure Code(s):        --- Professional ---                           [phone], Esophagogastroduodenoscopy, flexible,                            transoral; diagnostic, including collection of                            specimen(s) by brushing or washing, when performed                            (  separate procedure) Diagnosis Code(s):        --- Professional ---                           K44.9, Diaphragmatic hernia without obstruction or                            gangrene                           K25.9, Gastric ulcer, unspecified as acute or                            chronic, without hemorrhage or perforation                           K92.1, Melena (includes Hematochezia) CPT copyright 2022 American Medical Association. All rights reserved. The codes documented in this report are preliminary and upon coder review may  be revised to meet current compliance requirements. Toribio Fortune, MD Toribio Fortune,  09/19/2024 3:30:20 PM This report has been signed  electronically. Number of Addenda: 0

## 2024-09-19 NOTE — Brief Op Note (Signed)
 09/19/2024  3:29 PM  PATIENT:  [name]  88 y.o. female  PRE-OPERATIVE DIAGNOSIS:  rectal bleeding, heme positive stool, anemia, melena  POST-OPERATIVE DIAGNOSIS:  EGD: 9cm hiatal hernia, cameron erosions  COLON: hemorrhoids  PROCEDURE:  Procedure(s): COLONOSCOPY (N/A) EGD (ESOPHAGOGASTRODUODENOSCOPY) (N/A)  SURGEON:  Surgeons and Role:    * Eartha Angelia Sieving, MD - Primary  Patient underwent EGD AND COLONOSCOPY  under propofol  sedation.  Tolerated the procedure adequately.   EGD FINDINGS: - 9 cm hiatal hernia with a few Cameron ulcers.  - Normal stomach.  - Normal examined duodenum.   COLONOSCOPY FINDINGS: - Tortuous colon.  - Non-bleeding internal hemorrhoids.   RECOMMENDATIONS - Return patient to hospital ward for ongoing care. May be discharged home today. - Resume previous diet.  - Repeat colonoscopy is not recommended due to current age (60 years or older) for screening purposes.  - Anusol suppositories twice a day for 7 days. - Start daily Miralax - Can restart Eliquis  tomorrow.  Sieving Eartha, MD Gastroenterology and Hepatology [facility] Gastroenterology

## 2024-09-19 NOTE — Progress Notes (Signed)
 PROGRESS NOTE    [name]  FMW:[id number] DOB: [birth year] DOA: 09/17/2024 PCP: [name], MD   Brief Narrative:    [name] is a 88 y.o. female with medical history significant of atrial fibrillation on Eliquis , hypothyroidism, GERD, insomnia, vaginal cancer s/p radiation therapy.  She complained of dark stool recently, but complained of several days of vaginal bleeding and has to use several pads daily.  Patient complained of abdominal cramping as well and this usually resolve with Tylenol .  She has had melena and rectal bleeding and was seen by GI outpatient with plan for colonoscopy and endoscopy which has not been completed yet.   Patient is admitted due to concern for GI bleeding/vaginal bleeding   Assessment & Plan:   Principal Problem:   GI bleed Active Problems:   Acquired hypothyroidism   Persistent atrial fibrillation (HCC)   Hypokalemia   Vaginal bleeding   Macrocytic anemia   Hypocalcemia   Obesity, Class I, BMI 30-34.9   Insomnia  GI bleed H/H= 9.1/27.9, this was 9.7/30.6 on 09/08/2024 Bright red blood per rectum reported by EDP Continue IV Protonix  40 mg twice daily Planning for EGD colonoscopy today.  Vaginal bleed Patient reported vaginal bleed, she states this occurred after radiation therapy for vaginal cancer. Consider gynecology consult likely outpatient unless she bleeds significantly.  Repeat hemoglobin today shows improvement in hemoglobin to 9.0   Acute on chronic anemia: Secondary to above aggravated by anticoagulation therapy.  Monitor H&H daily patient.  Continued outpatient follow-up   Hypokalemia Hypomagnesemia: Replace as needed   Hypocalcemia Ca 8.4; continue Os-Cal when patient resumes oral intake   Obesity class I (BMI 30.48) Diet and lifestyle modification   Persistent atrial fibrillation  Eliquis  will be held at this time due to GI bleed/Vaginal bleed Resume Toprol  XL  Acquired hypothyroidism Resume  Synthroid   Insomnia Continue on Xanax  nightly   DVT prophylaxis: SCDs   Code Status: Full code   Family Communication:    Consults: Gastroenterology   Severity of Illness: The appropriate patient status for this patient is OBSERVATION. Observation status is judged to be reasonable and necessary in order to provide the required intensity of service to ensure the patient's safety. The patient's presenting symptoms, physical exam findings, and initial radiographic and laboratory data in the context of their medical condition is felt to place them at decreased risk for further clinical deterioration. Furthermore, it is anticipated that the patient will be medically stable for discharge from the hospital within 2 midnights of admission.     Subjective: Patient seen and examined at the bedside.  Denies GI bleeding overnight.  Did have GI prep overnight.  Reports only very little vaginal bleeding.  Objective: Vitals:   09/18/24 1740 09/18/24 2115 09/19/24 0505 09/19/24 1354  BP: (!) 164/67 (!) 143/71 (!) 147/62 (!) 161/89  Pulse: 75 85 78 (!) 102  Resp:  (!) 21 19 15   Temp: 98.3 F (36.8 C) (!) 97.5 F (36.4 C) 97.6 F (36.4 C) 98.4 F (36.9 C)  TempSrc: Oral Oral Oral Oral  SpO2: 99% 97% 95% 96%  Weight:    81.6 kg  Height:    5' 5.5 (1.664 m)    Intake/Output Summary (Last 24 hours) at 09/19/2024 1442 Last data filed at 09/18/2024 1704 Gross per 24 hour  Intake 409.33 ml  Output --  Net 409.33 ml   Filed Weights   09/18/24 0844 09/19/24 1354  Weight: 84.4 kg 81.6 kg  Examination:  General: Alert, oriented not in any acute distress Chest: Clear CVs: S1, S2, no murmur, regular rhythm Abdomen: Soft, nontender  personally reviewed following labs and imaging studies  CBC: Recent Labs  Lab 09/17/24 2124 09/18/24 0527 09/19/24 0401  WBC 10.1 9.3  --   NEUTROABS 6.7  --   --   HGB 9.1* 8.6* 9.0*  HCT 27.9* 26.1* 27.6*  MCV 103.3* 104.4*  --   PLT 393 321  --     Basic Metabolic Panel: Recent Labs  Lab 09/17/24 2124 09/18/24 0527  NA 135 136  K 3.0* 3.2*  CL 97* 99  CO2 25 27  GLUCOSE 107* 95  BUN 20 16  CREATININE 1.11* 0.95  CALCIUM 8.4* 8.2*  MG  --  1.5*  PHOS  --  2.6   GFR: Estimated Creatinine Clearance: 45.4 mL/min (by C-G formula based on SCr of 0.95 mg/dL). Liver Function Tests: Recent Labs  Lab 09/17/24 2124 09/18/24 0527  AST 41 34  ALT 17 14  ALKPHOS 85 71  BILITOT 0.2 0.4  PROT 7.1 6.4*  ALBUMIN 3.9 3.4*   No results for input(s): LIPASE, AMYLASE in the last 168 hours. No results for input(s): AMMONIA in the last 168 hours. Coagulation Profile: Recent Labs  Lab 09/17/24 2124  INR 1.0   Cardiac Enzymes: No results for input(s): CKTOTAL, CKMB, CKMBINDEX, TROPONINI in the last 168 hours. BNP (last 3 results) No results for input(s): PROBNP in the last 8760 hours. HbA1C: No results for input(s): HGBA1C in the last 72 hours. CBG: No results for input(s): GLUCAP in the last 168 hours. Lipid Profile: No results for input(s): CHOL, HDL, LDLCALC, TRIG, CHOLHDL, LDLDIRECT in the last 72 hours. Thyroid  Function Tests: No results for input(s): TSH, T4TOTAL, FREET4, T3FREE, THYROIDAB in the last 72 hours. Anemia Panel: Recent Labs    09/18/24 0527  VITAMINB12 445  FOLATE 10.3   Sepsis Labs: No results for input(s): PROCALCITON, LATICACIDVEN in the last 168 hours.  Recent Results (from the past 240 hours)  Microscopic Examination     Status: Abnormal   Collection Time: 09/14/24  8:52 AM   Urine  Result Value Ref Range Status   WBC, UA 6-10 (A) 0 - 5 /hpf Final   RBC, Urine 11-30 (A) 0 - 2 /hpf Final   Epithelial Cells (non renal) 0-10 0 - 10 /hpf Final   Bacteria, UA Few (A) None seen/Few Final         Radiology Studies: No results found.      Scheduled Meds:  [MAR Hold] ALPRAZolam   0.5 mg Oral QHS   [MAR Hold] Chlorhexidine  Gluconate Cloth   6 each Topical Daily   [MAR Hold] pantoprazole  (PROTONIX ) IV  40 mg Intravenous Q12H   [MAR Hold] sodium chloride  flush  10-40 mL Intracatheter Q12H   Continuous Infusions:  sodium chloride             Derryl Duval, MD [facility] 09/19/2024, 2:42 PM

## 2024-09-19 NOTE — Anesthesia Procedure Notes (Signed)
 Date/Time: 09/19/2024 2:44 PM  Performed by: Para Jerelene CROME, CRNAOxygen Delivery Method: Nasal cannula Comments: OptiFlow Nasal Cannula.

## 2024-09-19 NOTE — Progress Notes (Signed)
 Mobility Specialist Progress Note:    09/19/24 0840  Mobility  Activity Ambulated with assistance  Level of Assistance Independent  Assistive Device [place]  Distance Ambulated (ft) 240 ft  Range of Motion/Exercises Active;All extremities  Activity Response Tolerated well  Mobility Referral Yes  Mobility visit 1 Mobility  Mobility Specialist Start Time (ACUTE ONLY) 0840  Mobility Specialist Stop Time (ACUTE ONLY) 0900  Mobility Specialist Time Calculation (min) (ACUTE ONLY) 20 min   Pt received in bed, agreeable to mobility. Independently able to stand and ambulate with no AD. Tolerated well, asx throughout. Returned supine, all needs met.   Shalva Rozycki Mobility Specialist Please contact via Special educational needs teacher or  Rehab office at [phone]

## 2024-09-20 ENCOUNTER — Encounter (HOSPITAL_COMMUNITY): Payer: Self-pay | Admitting: Gastroenterology

## 2024-09-20 DIAGNOSIS — E66811 Obesity, class 1: Secondary | ICD-10-CM | POA: Diagnosis not present

## 2024-09-20 DIAGNOSIS — E039 Hypothyroidism, unspecified: Secondary | ICD-10-CM | POA: Diagnosis not present

## 2024-09-20 DIAGNOSIS — E876 Hypokalemia: Secondary | ICD-10-CM | POA: Diagnosis not present

## 2024-09-20 DIAGNOSIS — D539 Nutritional anemia, unspecified: Secondary | ICD-10-CM | POA: Diagnosis not present

## 2024-09-20 DIAGNOSIS — K625 Hemorrhage of anus and rectum: Secondary | ICD-10-CM | POA: Diagnosis not present

## 2024-09-20 LAB — BASIC METABOLIC PANEL WITH GFR
Anion gap: 11 (ref 5–15)
BUN: 7 mg/dL — ABNORMAL LOW (ref 8–23)
CO2: 28 mmol/L (ref 22–32)
Calcium: 8.4 mg/dL — ABNORMAL LOW (ref 8.9–10.3)
Chloride: 93 mmol/L — ABNORMAL LOW (ref 98–111)
Creatinine, Ser: 0.79 mg/dL (ref 0.44–1.00)
GFR, Estimated: >60 mL/min (ref >60)
Glucose, Bld: 99 mg/dL (ref 70–99)
Potassium: 3.6 mmol/L (ref 3.5–5.1)
Sodium: 132 mmol/L — ABNORMAL LOW (ref 135–145)

## 2024-09-20 LAB — CBC
HCT: 27.9 % — ABNORMAL LOW (ref 36.0–46.0)
Hemoglobin: 9.1 g/dL — ABNORMAL LOW (ref 12.0–15.0)
MCH: 33.8 pg (ref 26.0–34.0)
MCHC: 32.6 g/dL (ref 30.0–36.0)
MCV: 103.7 fL — ABNORMAL HIGH (ref 80.0–100.0)
Platelets: 394 K/uL (ref 150–400)
RBC: 2.69 MIL/uL — ABNORMAL LOW (ref 3.87–5.11)
RDW: 14.9 % (ref 11.5–15.5)
WBC: 11.9 K/uL — ABNORMAL HIGH (ref 4.0–10.5)
nRBC: 0 % (ref 0.0–0.2)

## 2024-09-20 LAB — MAGNESIUM: Magnesium: 1.8 mg/dL (ref 1.7–2.4)

## 2024-09-20 MED ORDER — HEPARIN SOD (PORK) LOCK FLUSH 100 UNIT/ML IV SOLN
500.0000 [IU] | Freq: Once | INTRAVENOUS | Status: AC
  Administered 2024-09-20: 500 [IU] via INTRAVENOUS
  Filled 2024-09-20: qty 5

## 2024-09-20 MED ORDER — HYDROCORTISONE ACETATE 25 MG RE SUPP
25.0000 mg | Freq: Two times a day (BID) | RECTAL | 0 refills | Status: AC
Start: 2024-09-20 — End: 2024-09-27

## 2024-09-20 MED ORDER — POLYETHYLENE GLYCOL 3350 17 G PO PACK: 17.0000 g | PACK | Freq: Every day | ORAL | 0 refills | Status: DC

## 2024-09-20 MED ORDER — APIXABAN 5 MG PO TABS
5.0000 mg | ORAL_TABLET | Freq: Two times a day (BID) | ORAL | Status: DC
  Administered 2024-09-20: 5 mg via ORAL
  Filled 2024-09-20: qty 1

## 2024-09-20 MED ORDER — ADULT MULTIVITAMIN W/MINERALS CH
1.0000 | ORAL_TABLET | Freq: Every day | ORAL | Status: DC
  Administered 2024-09-20: 1 via ORAL
  Filled 2024-09-20: qty 1

## 2024-09-20 NOTE — Progress Notes (Signed)
 Port flushed with heparin  and deaccessed.  No bleeding.  Bandaid applied.  Transported by WC to main entrance and family to drive home.

## 2024-09-20 NOTE — Progress Notes (Signed)
 Mobility Specialist Progress Note:    09/20/24 1105  Mobility  Activity Ambulated with assistance  Level of Assistance Independent  Assistive Device [place]  Distance Ambulated (ft) 240 ft  Range of Motion/Exercises Active;All extremities  Activity Response Tolerated well  Mobility Referral Yes  Mobility visit 1 Mobility  Mobility Specialist Start Time (ACUTE ONLY) 1105  Mobility Specialist Stop Time (ACUTE ONLY) 1125  Mobility Specialist Time Calculation (min) (ACUTE ONLY) 20 min   Pt received standing EOB, agreeable to mobility. Independently able to ambulate with straight cane. Tolerated well, asx throughout. Left sitting EOB, all needs met.   [name] Mobility Specialist Please contact via Special educational needs teacher or  Rehab office at [phone]

## 2024-09-20 NOTE — Progress Notes (Signed)
 Daughter has arrived with patient clothing. Discharge has been reviewed with daughter and patient, they verbalized understanding. IV removed. Waiting for heparin  to de-access the port. [name] will de-access port. Spoke to pharmacy to ensure accuracy of heparin  order for this.

## 2024-09-20 NOTE — Discharge Summary (Signed)
 Physician Discharge Summary   Patient: [name] MRN: [id number] DOB: 05-22-1936  Admit date:     09/17/2024  Discharge date: {dischdate:26783}  Discharge Physician: Concepcion Riser   PCP: [name], MD   Recommendations at discharge:  {Tip this will not be part of the note when signed- Example include specific recommendations for outpatient follow-up, pending tests to follow-up on. (Optional):26781}  ***  Discharge Diagnoses: Principal Problem:   GI bleed Active Problems:   Acquired hypothyroidism   Persistent atrial fibrillation (HCC)   Hypokalemia   Vaginal bleeding   Macrocytic anemia   Hypocalcemia   Obesity, Class I, BMI 30-34.9   Insomnia  Resolved Problems:   * No resolved hospital problems. [facility] Course: No notes on file  Assessment and Plan: No notes have been filed under this hospital service. Service: Hospitalist     {Tip this will not be part of the note when signed Body mass index is 29.5 kg/m. , ,  (Optional):26781}  {(NOTE) Pain control PDMP Statment (Optional):26782} Consultants: *** Procedures performed: ***  Disposition: {Plan; Disposition:26390} Diet recommendation:  Discharge Diet Orders (From admission, onward)     Start     Ordered   09/20/24 0000  Diet - low sodium heart healthy        09/20/24 1316           {Diet_Plan:26776} DISCHARGE MEDICATION: Allergies as of 09/20/2024       Reactions   Erythromycin Shortness Of Breath   Aspirin Other (See Comments)   Caused internal bleeding. Patient had ulcer at the time. Instructed by PCP - Nancey not to take any longer.   Ibuprofen Other (See Comments)   Instructed not to take by PCP - Nancey due to reaction to aspirin as well (precautionary).        Medication List     STOP taking these medications    diphenoxylate -atropine  2.5-0.025 MG tablet Commonly known as: LOMOTIL        TAKE these medications    acetaminophen  650 MG CR  tablet Commonly known as: TYLENOL  Take 650 mg by mouth every 8 (eight) hours as needed for pain.   albuterol  108 (90 Base) MCG/ACT inhaler Commonly known as: VENTOLIN  HFA INHALE 2 PUFFS INTO THE LUNGS EVERY 6 HOURS AS NEEDED FOR WHEEZING OR SHORTNESS OF BREATH   alendronate  70 MG tablet Commonly known as: FOSAMAX  TAKE 1 TABLET(70 MG) BY MOUTH EVERY 7 DAYS WITH A FULL GLASS OF WATER  AND ON AN EMPTY STOMACH   ALPRAZolam  0.5 MG tablet Commonly known as: XANAX  TAKE 1 TABLET BY MOUTH EVERY NIGHT AT BEDTIME. MAY TAKE 1/2 TABLET DURING THE DAY AS NEEDED   Eliquis  5 MG Tabs tablet Generic drug: apixaban  TAKE 1 TABLET(5 MG) BY MOUTH TWICE DAILY. FOLLOW-UP   hydrocortisone 25 MG suppository Commonly known as: ANUSOL-HC Place 1 suppository (25 mg total) rectally every 12 (twelve) hours for 7 days.   levothyroxine  125 MCG tablet Commonly known as: SYNTHROID  TAKE 1 TABLET BY MOUTH DAILY   magnesium gluconate 500 (27 Mg) MG Tabs tablet Commonly known as: MAGONATE Take 1 tablet (500 mg total) by mouth daily at 6 (six) AM. What changed: additional instructions   metoprolol  succinate 25 MG 24 hr tablet Commonly known as: TOPROL -XL Take 1 tablet (25 mg total) by mouth daily.   MULTI-VITAMIN DAILY PO Take 1 tablet by mouth daily.   nitrofurantoin (macrocrystal-monohydrate) 100 MG capsule Commonly known as: MACROBID Take 1 capsule (100 mg total) by  mouth 2 (two) times daily.   ondansetron  4 MG disintegrating tablet Commonly known as: ZOFRAN -ODT Take 1 tablet (4 mg total) by mouth every 8 (eight) hours as needed.   pantoprazole  40 MG tablet Commonly known as: PROTONIX  TAKE 1 TABLET(40 MG) BY MOUTH DAILY   polyethylene glycol 17 g packet Commonly known as: MiraLax Take 17 g by mouth daily.   potassium chloride  SA 20 MEQ tablet Commonly known as: KLOR-CON  M TAKE 1 TABLET (20 MEQ) BY MOUTH DAILY   pregabalin  25 MG capsule Commonly known as: LYRICA  Take 25 mg by mouth 2 (two)  times daily.   sertraline  100 MG tablet Commonly known as: ZOLOFT  TAKE 1 TABLET BY MOUTH EVERY DAY   Vitamin D3 50 MCG (2000 UT) Tabs Take 2,000 Units by mouth daily.        Discharge Exam: Filed Weights   09/18/24 0844 09/19/24 1354  Weight: 84.4 kg 81.6 kg   ***  Condition at discharge: {DC Condition:26389}  The results of significant diagnostics from this hospitalization (including imaging, microbiology, ancillary and laboratory) are listed below for reference.   Imaging Studies: CT Renal Stone Study Result Date: 08/28/2024 CLINICAL DATA:  Oliguria 10 days. Abdominal pain and discomfort. Patient reports symptoms began before finishing radiation therapy for vaginal cancer. EXAM: CT ABDOMEN AND PELVIS WITHOUT CONTRAST TECHNIQUE: Multidetector CT imaging of the abdomen and pelvis was performed following the standard protocol without IV contrast. RADIATION DOSE REDUCTION: This exam was performed according to the departmental dose-optimization program which includes automated exposure control, adjustment of the mA and/or kV according to patient size and/or use of iterative reconstruction technique. COMPARISON:  MRI pelvis 07/10/2024, PET-CT 04/20/2024 FINDINGS: Lower chest: Borderline cardiomegaly. Calcified plaque over the descending thoracic aorta. Moderate size hiatal hernia. Lung bases demonstrate no acute airspace disease or effusion. Hepatobiliary: Previous cholecystectomy. Liver and biliary tree are normal. Pancreas: Normal. Spleen: Normal. Adrenals/Urinary Tract: Adrenal glands are normal. Kidneys are normal in size with minimal fullness of the intrarenal collecting systems. No nephrolithiasis. Right renal cyst unchanged is no follow-up imaging recommended. Ureters are normal. Foley catheter is present within a decompressed bladder. There is ill definition of the bladder wall with stranding of the adjacent fat likely due to post radiation changes. Stomach/Bowel: Moderate size hiatal  hernia unchanged. Multiple surgical clips over the hiatal hernia and stomach as well as upper abdomen. Small bowel is unremarkable. Colon is unremarkable. Appendix is not visualized. Vascular/Lymphatic: Minimal calcified plaque over the abdominal aorta which is normal caliber. Remaining vascular structures are unremarkable. No significant adenopathy. Reproductive: Uterus and bilateral adnexa are unremarkable. Minimal soft tissue prominence of the vaginal vault containing a focus of air as these findings are slightly more prominent compared to the prior exam. There is more stranding of the adjacent pelvic fat with small amount of free pelvic fluid present. Findings likely due to post radiation change. Other: Moderate size right inguinal hernia containing a short segment of small bowel unchanged. Musculoskeletal: No focal abnormality. IMPRESSION: 1. No acute findings in the abdomen/pelvis. 2. Minimal soft tissue prominence of the vaginal vault containing a focus of air as these findings are slightly more prominent compared to the prior exam. There is more stranding of the adjacent pelvic fat with small amount of free pelvic fluid present. Findings likely due to post radiation change. 3. Foley catheter within a decompressed bladder. There is ill definition of the bladder wall with stranding of the adjacent fat likely due to post radiation changes. Cystitis/infection could also  produce these findings. Recommend clinical correlation. 4. Moderate size hiatal hernia unchanged. 5. Moderate size right inguinal hernia containing a short segment of small bowel unchanged. 6. Aortic atherosclerosis. Aortic Atherosclerosis (ICD10-I70.0). Electronically Signed   By: Toribio Agreste M.D.   On: 08/28/2024 16:58    Microbiology: Results for orders placed or performed in visit on 09/13/24  Microscopic Examination     Status: Abnormal   Collection Time: 09/14/24  8:52 AM   Urine  Result Value Ref Range Status   WBC, UA 6-10 (A)  0 - 5 /hpf Final   RBC, Urine 11-30 (A) 0 - 2 /hpf Final   Epithelial Cells (non renal) 0-10 0 - 10 /hpf Final   Bacteria, UA Few (A) None seen/Few Final  Urine Culture     Status: None   Collection Time: 09/15/24  1:55 PM   Specimen: Urine   Urine  Result Value Ref Range Status   Urine Culture, Routine Final report  Final   Organism ID, Bacteria Comment  Final    Comment: Mixed urogenital flora 25,000-50,000 colony forming units per mL    ORGANISM ID, BACTERIA Not applicable  Final    Labs: CBC: Recent Labs  Lab 09/17/24 2124 09/18/24 0527 09/19/24 0401 09/20/24 0924  WBC 10.1 9.3  --  11.9*  NEUTROABS 6.7  --   --   --   HGB 9.1* 8.6* 9.0* 9.1*  HCT 27.9* 26.1* 27.6* 27.9*  MCV 103.3* 104.4*  --  103.7*  PLT 393 321  --  394   Basic Metabolic Panel: Recent Labs  Lab 09/17/24 2124 09/18/24 0527 09/20/24 0924  NA 135 136 132*  K 3.0* 3.2* 3.6  CL 97* 99 93*  CO2 25 27 28   GLUCOSE 107* 95 99  BUN 20 16 7*  CREATININE 1.11* 0.95 0.79  CALCIUM 8.4* 8.2* 8.4*  MG  --  1.5* 1.8  PHOS  --  2.6  --    Liver Function Tests: Recent Labs  Lab 09/17/24 2124 09/18/24 0527  AST 41 34  ALT 17 14  ALKPHOS 85 71  BILITOT 0.2 0.4  PROT 7.1 6.4*  ALBUMIN 3.9 3.4*   CBG: No results for input(s): GLUCAP in the last 168 hours.  Discharge time spent: {LESS THAN/GREATER UYJW:73611} 30 minutes.  Signed: Concepcion Riser, MD [facility] 09/20/2024

## 2024-09-21 ENCOUNTER — Telehealth: Payer: Self-pay

## 2024-09-21 NOTE — Telephone Encounter (Signed)
 Outpatient GI follow up has been scheduled

## 2024-09-21 NOTE — Transitions of Care (Post Inpatient/ED Visit) (Unsigned)
   09/21/2024  Name: FLORNCE RECORD MRN: [id number] DOB: 1936/07/22  Today's TOC FU Call Status: Today's TOC FU Call Status:: Unsuccessful Call (1st Attempt) Unsuccessful Call (1st Attempt) Date: 09/21/24  Attempted to reach the patient regarding the most recent Inpatient/ED visit.  Follow Up Plan: Additional outreach attempts will be made to reach the patient to complete the Transitions of Care (Post Inpatient/ED visit) call.   Signature [name], LPN [facility] Nurse Health Advisor Direct Dial [phone]

## 2024-09-22 ENCOUNTER — Encounter: Payer: Self-pay | Admitting: Radiation Oncology

## 2024-09-22 NOTE — Transitions of Care (Post Inpatient/ED Visit) (Signed)
 09/22/2024  Name: [name] MRN: [id number] DOB: [birth year]  Today's TOC FU Call Status: Today's TOC FU Call Status:: Successful TOC FU Call Completed Unsuccessful Call (1st Attempt) Date: 09/21/24 [facility] FU Call Complete Date: 09/22/24 Patient's Name and Date of Birth confirmed.  Transition Care Management Follow-up Telephone Call Date of Discharge: 09/20/24 [name] Type of Discharge: Inpatient Admission Primary Inpatient Discharge Diagnosis:: Gi bleed How have you been since you were released from the hospital?: Better Any questions or concerns?: No  Items Reviewed: Did you receive and understand the discharge instructions provided?: Yes Medications obtained,verified, and reconciled?: Yes (Medications Reviewed) Any new allergies since your discharge?: No Dietary orders reviewed?: Yes Do you have support at home?: Yes People in Home [RPT]: grandchild(ren)  Medications Reviewed Today: Medications Reviewed Today     Reviewed by [name], LPN (Licensed Practical Nurse) on 09/22/24 at 1102  Med List Status: <None>   Medication Order Taking? Sig Documenting Provider Last Dose Status Informant  acetaminophen  (TYLENOL ) 650 MG CR tablet [id number] Yes Take 650 mg by mouth every 8 (eight) hours as needed for pain. [provider]  Active Self  albuterol  (VENTOLIN  HFA) 108 (90 Base) MCG/ACT inhaler [id number] Yes INHALE 2 PUFFS INTO THE LUNGS EVERY 6 HOURS AS NEEDED FOR WHEEZING OR SHORTNESS OF BREATH Luking, Glendia LABOR, MD  Active Self  alendronate  (FOSAMAX ) 70 MG tablet [id number] Yes TAKE 1 TABLET(70 MG) BY MOUTH EVERY 7 DAYS WITH A FULL GLASS OF WATER  AND ON AN EMPTY STOMACH Luking, Glendia LABOR, MD  Active Self  ALPRAZolam  (XANAX ) 0.5 MG tablet [id number] Yes TAKE 1 TABLET BY MOUTH EVERY NIGHT AT BEDTIME. MAY TAKE 1/2 TABLET DURING THE DAY AS NEEDED Luking, Glendia LABOR, MD  Active Self  Cholecalciferol (VITAMIN D3) 2000 UNITS TABS [id number] Yes Take  2,000 Units by mouth daily. [provider]  Active Self  ELIQUIS  5 MG TABS tablet [id number] Yes TAKE 1 TABLET(5 MG) BY MOUTH TWICE DAILY. FOLLOW-UP [name], Mihai, MD  Active Self  hydrocortisone (ANUSOL-HC) 25 MG suppository [id number] Yes Place 1 suppository (25 mg total) rectally every 12 (twelve) hours for 7 days. Darci Pore, MD  Active   levothyroxine  (SYNTHROID ) 125 MCG tablet [id number] Yes TAKE 1 TABLET BY MOUTH DAILY Luking, Glendia LABOR, MD  Active Self  magnesium gluconate (MAGONATE) 500 (27 Mg) MG TABS tablet [id number] Yes Take 1 tablet (500 mg total) by mouth daily at 6 (six) AM.  Patient taking differently: Take 500 mg by mouth daily at 6 (six) AM. As needed   [name], MD  Active Self  metoprolol  succinate (TOPROL -XL) 25 MG 24 hr tablet [id number] Yes Take 1 tablet (25 mg total) by mouth daily. Mauro Elveria BROCKS, NP  Active Self  Multiple Vitamin (MULTI-VITAMIN DAILY PO) [id number] Yes Take 1 tablet by mouth daily. [provider]  Active Self  nitrofurantoin, macrocrystal-monohydrate, (MACROBID) 100 MG capsule [id number] Yes Take 1 capsule (100 mg total) by mouth 2 (two) times daily. McKenzie, Belvie CROME, MD  Active Self           Med Note SALBADOR, [name]   Mon Sep 18, 2024  9:50 AM) Ends october 24th  ondansetron  (ZOFRAN -ODT) 4 MG disintegrating tablet [id number] Yes Take 1 tablet (4 mg total) by mouth every 8 (eight) hours as needed. Gilmer [name], NP  Active Self  pantoprazole  (PROTONIX ) 40 MG tablet [id number] Yes TAKE 1 TABLET(40 MG) BY MOUTH DAILY Cook, Jayce G, DO  Active Self  polyethylene glycol (MIRALAX) 17 g packet [id number] Yes Take 17 g by mouth daily. Darci Pore, MD  Active   potassium chloride  (KLOR-CON  M) 20 MEQ tablet [id number] Yes TAKE 1 TABLET (20 MEQ) BY MOUTH DAILY [name], MD  Active Self  pregabalin  (LYRICA ) 25 MG capsule [id number] Yes Take 25 mg by mouth 2 (two) times daily. [provider]   Active Self  sertraline  (ZOLOFT ) 100 MG tablet [id number] Yes TAKE 1 TABLET BY MOUTH EVERY DAY Luking, Glendia LABOR, MD  Active Self            Home Care and Equipment/Supplies: Were Home Health Services Ordered?: NA Any new equipment or medical supplies ordered?: NA  Functional Questionnaire: Do you need assistance with bathing/showering or dressing?: No Do you need assistance with meal preparation?: Yes Do you need assistance with eating?: No Do you have difficulty maintaining continence: No Do you need assistance with getting out of bed/getting out of a chair/moving?: No Do you have difficulty managing or taking your medications?: Yes  Follow up appointments reviewed: PCP Follow-up appointment confirmed?: Yes Date of PCP follow-up appointment?: 10/03/24 Follow-up Provider: [facility] Follow-up appointment confirmed?: No Reason Specialist Follow-Up Not Confirmed: Patient has Specialist Provider Number and will Call for Appointment Do you need transportation to your follow-up appointment?: No Do you understand care options if your condition(s) worsen?: Yes-patient verbalized understanding    SIGNATURE [name], LPN [facility] Nurse Health Advisor Direct Dial [phone]

## 2024-09-24 ENCOUNTER — Ambulatory Visit
Admission: EM | Admit: 2024-09-24 | Discharge: 2024-09-24 | Disposition: A | Discharge status: Home / Self Care | Attending: Nurse Practitioner | Admitting: Nurse Practitioner

## 2024-09-24 ENCOUNTER — Emergency Department (HOSPITAL_COMMUNITY)
Admission: EM | Admit: 2024-09-24 | Discharge: 2024-09-24 | Disposition: A | Discharge status: Home / Self Care | Attending: Emergency Medicine | Admitting: Emergency Medicine

## 2024-09-24 ENCOUNTER — Emergency Department (HOSPITAL_COMMUNITY)

## 2024-09-24 ENCOUNTER — Ambulatory Visit

## 2024-09-24 ENCOUNTER — Other Ambulatory Visit: Payer: Self-pay

## 2024-09-24 ENCOUNTER — Encounter (HOSPITAL_COMMUNITY): Payer: Self-pay | Admitting: *Deleted

## 2024-09-24 DIAGNOSIS — M7918 Myalgia, other site: Secondary | ICD-10-CM

## 2024-09-24 DIAGNOSIS — Z8544 Personal history of malignant neoplasm of other female genital organs: Secondary | ICD-10-CM | POA: Diagnosis not present

## 2024-09-24 DIAGNOSIS — M25552 Pain in left hip: Secondary | ICD-10-CM | POA: Insufficient documentation

## 2024-09-24 DIAGNOSIS — K4090 Unilateral inguinal hernia, without obstruction or gangrene, not specified as recurrent: Secondary | ICD-10-CM | POA: Diagnosis not present

## 2024-09-24 DIAGNOSIS — I10 Essential (primary) hypertension: Secondary | ICD-10-CM | POA: Insufficient documentation

## 2024-09-24 DIAGNOSIS — S0990XA Unspecified injury of head, initial encounter: Secondary | ICD-10-CM | POA: Insufficient documentation

## 2024-09-24 DIAGNOSIS — S72009A Fracture of unspecified part of neck of unspecified femur, initial encounter for closed fracture: Secondary | ICD-10-CM | POA: Diagnosis not present

## 2024-09-24 DIAGNOSIS — D72829 Elevated white blood cell count, unspecified: Secondary | ICD-10-CM | POA: Insufficient documentation

## 2024-09-24 DIAGNOSIS — Z043 Encounter for examination and observation following other accident: Secondary | ICD-10-CM | POA: Diagnosis not present

## 2024-09-24 DIAGNOSIS — I6782 Cerebral ischemia: Secondary | ICD-10-CM | POA: Diagnosis not present

## 2024-09-24 DIAGNOSIS — E039 Hypothyroidism, unspecified: Secondary | ICD-10-CM | POA: Insufficient documentation

## 2024-09-24 DIAGNOSIS — W08XXXA Fall from other furniture, initial encounter: Secondary | ICD-10-CM | POA: Insufficient documentation

## 2024-09-24 DIAGNOSIS — N3289 Other specified disorders of bladder: Secondary | ICD-10-CM | POA: Diagnosis not present

## 2024-09-24 DIAGNOSIS — Y9301 Activity, walking, marching and hiking: Secondary | ICD-10-CM | POA: Diagnosis not present

## 2024-09-24 DIAGNOSIS — W19XXXA Unspecified fall, initial encounter: Secondary | ICD-10-CM

## 2024-09-24 DIAGNOSIS — D649 Anemia, unspecified: Secondary | ICD-10-CM | POA: Insufficient documentation

## 2024-09-24 LAB — CBC WITH DIFFERENTIAL/PLATELET
Abs Immature Granulocytes: 0.09 K/uL — ABNORMAL HIGH (ref 0.00–0.07)
Basophils Absolute: 0 K/uL (ref 0.0–0.1)
Basophils Relative: 0 %
Eosinophils Absolute: 0.1 K/uL (ref 0.0–0.5)
Eosinophils Relative: 1 %
HCT: 26.1 % — ABNORMAL LOW (ref 36.0–46.0)
Hemoglobin: 8.5 g/dL — ABNORMAL LOW (ref 12.0–15.0)
Immature Granulocytes: 1 %
Lymphocytes Relative: 23 %
Lymphs Abs: 2.4 K/uL (ref 0.7–4.0)
MCH: 34 pg (ref 26.0–34.0)
MCHC: 32.6 g/dL (ref 30.0–36.0)
MCV: 104.4 fL — ABNORMAL HIGH (ref 80.0–100.0)
Monocytes Absolute: 0.6 K/uL (ref 0.1–1.0)
Monocytes Relative: 6 %
Neutro Abs: 7.4 K/uL (ref 1.7–7.7)
Neutrophils Relative %: 69 %
Platelets: 368 K/uL (ref 150–400)
RBC: 2.5 MIL/uL — ABNORMAL LOW (ref 3.87–5.11)
RDW: 15.3 % (ref 11.5–15.5)
WBC: 10.7 K/uL — ABNORMAL HIGH (ref 4.0–10.5)
nRBC: 0 % (ref 0.0–0.2)

## 2024-09-24 LAB — COMPREHENSIVE METABOLIC PANEL WITH GFR
ALT: 19 U/L (ref 0–44)
AST: 33 U/L (ref 15–41)
Albumin: 3.7 g/dL (ref 3.5–5.0)
Alkaline Phosphatase: 78 U/L (ref 38–126)
Anion gap: 10 (ref 5–15)
BUN: 15 mg/dL (ref 8–23)
CO2: 28 mmol/L (ref 22–32)
Calcium: 9 mg/dL (ref 8.9–10.3)
Chloride: 98 mmol/L (ref 98–111)
Creatinine, Ser: 0.87 mg/dL (ref 0.44–1.00)
GFR, Estimated: >60 mL/min (ref >60)
Glucose, Bld: 114 mg/dL — ABNORMAL HIGH (ref 70–99)
Potassium: 3.8 mmol/L (ref 3.5–5.1)
Sodium: 135 mmol/L (ref 135–145)
Total Bilirubin: 0.5 mg/dL (ref 0.0–1.2)
Total Protein: 6.8 g/dL (ref 6.5–8.1)

## 2024-09-24 LAB — TROPONIN T, HIGH SENSITIVITY
Troponin T High Sensitivity: 38 ng/L — ABNORMAL HIGH (ref 0–19)
Troponin T High Sensitivity: 37 ng/L — ABNORMAL HIGH (ref 0–19)

## 2024-09-24 LAB — CK: Total CK: 260 U/L — ABNORMAL HIGH (ref 38–234)

## 2024-09-24 NOTE — ED Notes (Signed)
 Patient is being discharged from the Urgent Care and sent to the Emergency Department via POV . Per provider, patient is in need of higher level of care due to femoral neck fracture. Patient is aware and verbalizes understanding of plan of care.  Vitals:   09/24/24 0925  BP: (!) 146/79  Pulse: 71  Resp: 20  Temp: 97.9 F (36.6 C)  SpO2: 92%

## 2024-09-24 NOTE — ED Provider Notes (Signed)
 [facility]    CSN: [id number] Arrival date & time: 09/24/24  9082      History   Chief Complaint Chief Complaint  Patient presents with   Fall    HPI [name] is a 88 y.o. female.   The history is provided by the patient.   Patient presents for complaints of left buttock pain after a fall that occurred approximately 3 days ago.  Patient states she was just getting home from the hospital and entering into her bedroom.  She states for some reason I just fell.  Patient states that she did hit her head, but hit her head on carpet.  She states that she does not have any soreness, bruising, or swelling in or the area on her head that hit the carpet.  She does continue to complain of pain in the left buttock.  States that area hit the hardwood floors.  She states that her pain has actually better today.  States that she does not have to position herself as she normally would have when getting out of bed and states that it is easier for her to get up and down.  She reports that she does have underlying history of atrial fibrillation, states that she just found out that she had congestive heart failure.  Patient also informs that she has a history of vaginal cancer.  Patient states that she does take Eliquis .  She denies dizziness, lightheadedness, lower extremity weakness, numbness or tingling in the lower extremities, headache, blurred vision, low back pain, bruising, swelling, or redness.  Past Medical History:  Diagnosis Date   Arthritis    CHF (congestive heart failure) (HCC)    Chronic Leukemia    Depression    Dysrhythmia    A.fib   on Eliquis    H/O hiatal hernia    Hernia    Hernia    right inguinal hernia   History of radiation therapy    Vagina- 08/21/24-08/25/24- Dr. Lynwood Nasuti   Hypertension    Hypothyroidism    Leg swelling    Osteitis pubis 05/30/2001   Osteopenia 03/30/2009   Osteopenia 07/30/2022   Bone density 2021 showed elevated FRAX  patient was started on Fosamax    Pneumonia    Thyroid  disease     Patient Active Problem List   Diagnosis Date Noted   Vaginal bleeding 09/18/2024   Macrocytic anemia 09/18/2024   Hypocalcemia 09/18/2024   Obesity, Class I, BMI 30-34.9 09/18/2024   Insomnia 09/18/2024   GI bleed 09/17/2024   Hypokalemia 08/31/2024   Dysuria 08/31/2024   DNR (do not resuscitate) 07/23/2024   Pancytopenia, acquired (HCC) 06/16/2024   Anemia due to antineoplastic chemotherapy 05/26/2024   Constipation 05/24/2024   Hypomagnesemia 05/16/2024   Goals of care, counseling/discussion 05/12/2024   Vaginal cancer (HCC) 04/28/2024   Idiopathic peripheral neuropathy 04/28/2024   Hearing deficit, bilateral 04/28/2024   Postmenopausal vaginal bleeding 03/29/2024   Urinary incontinence 03/29/2024   Acute cystitis with hematuria 03/08/2024   Chronic diastolic (congestive) heart failure (HCC) 01/28/2023   Lymphoproliferative disease (HCC) 01/28/2023   Idiopathic pulmonary fibrosis (HCC) 08/18/2022   Osteopenia 07/30/2022   Persistent atrial fibrillation (HCC)    Monoclonal B-cell lymphocytosis 01/21/2022   Bilateral leg edema 05/24/2015   GERD (gastroesophageal reflux disease) 07/02/2014   Hyperlipidemia 05/16/2013   Acquired hypothyroidism 05/16/2013   Essential hypertension, benign 05/16/2013    Past Surgical History:  Procedure Laterality Date   ABDOMINAL HYSTERECTOMY  03/1970   APPENDECTOMY  03/1966   BILATERAL SALPINGOOPHORECTOMY     CARDIOVERSION N/A 03/10/2022   Procedure: CARDIOVERSION;  Surgeon: Francyne Headland, MD;  Location: [facility];  Service: Cardiovascular;  Laterality: N/A;   CATARACT EXTRACTION W/PHACO Right 09/25/2014   Procedure: CATARACT EXTRACTION PHACO AND INTRAOCULAR LENS PLACEMENT (IOC);  Surgeon: Oneil T. Roz, MD;  Location: [facility];  Service: Ophthalmology;  Laterality: Right;  CDE:7.56   CATARACT EXTRACTION W/PHACO Left 10/09/2014   Procedure: CATARACT EXTRACTION PHACO AND  INTRAOCULAR LENS PLACEMENT LEFT EYE CDE=6.51;  Surgeon: Oneil T. Roz, MD;  Location: [facility];  Service: Ophthalmology;  Laterality: Left;   CHOLECYSTECTOMY  1980   COLONOSCOPY  1999   COLONOSCOPY  March 2007   Repeat 10 years   COLONOSCOPY  11/06/2003    Internal and external hemorrhoids/Two tiny rectal distal sigmoid polyps cold biopsied/Otherwise within normal limits to the cecum   COLONOSCOPY N/A 08/29/2014   Dr. Shaaron: Sessile polyp was found at the cecum status post cold snare polypectomy, tubular adenoma. No further surveillance unless clinical status changes   COLONOSCOPY N/A 09/19/2024   Procedure: COLONOSCOPY;  Surgeon: Eartha Angelia Sieving, MD;  Location: [facility];  Service: Gastroenterology;  Laterality: N/A;   CYSTOSCOPY N/A 05/24/2024   Procedure: CYSTOSCOPY;  Surgeon: Viktoria Comer SAUNDERS, MD;  Location: [facility];  Service: Gynecology;  Laterality: N/A;   ESOPHAGOGASTRODUODENOSCOPY  2007   Dr. Shaaron: normal esophagus, small tom oderate sized hiatal hernia, Cameron lesions   ESOPHAGOGASTRODUODENOSCOPY N/A 09/19/2024   Procedure: EGD (ESOPHAGOGASTRODUODENOSCOPY);  Surgeon: Eartha Angelia, Sieving, MD;  Location: [facility];  Service: Gastroenterology;  Laterality: N/A;   EXAM UNDER ANESTHESIA, PELVIC N/A 05/24/2024   Procedure: EXAM UNDER ANESTHESIA, PELVIC;  Surgeon: Viktoria Comer SAUNDERS, MD;  Location: [facility];  Service: Gynecology;  Laterality: N/A;   IR IMAGING GUIDED PORT INSERTION  05/23/2024   SKIN CANCER EXCISION Left 11 1 2016   left arm squamous cancer   TUMOR REMOVAL  03/1968   ovarian    OB History     Gravida  1   Para  1   Term  1   Preterm      AB      Living  1      SAB      IAB      Ectopic      Multiple      Live Births  1            Home Medications    Prior to Admission medications   Medication Sig Start Date End Date Taking? Authorizing Provider  acetaminophen  (TYLENOL ) 650 MG CR tablet Take 650 mg by mouth every 8  (eight) hours as needed for pain.    [provider]  albuterol  (VENTOLIN  HFA) 108 (90 Base) MCG/ACT inhaler INHALE 2 PUFFS INTO THE LUNGS EVERY 6 HOURS AS NEEDED FOR WHEEZING OR SHORTNESS OF BREATH 05/29/24   [name], MD  alendronate  (FOSAMAX ) 70 MG tablet TAKE 1 TABLET(70 MG) BY MOUTH EVERY 7 DAYS WITH A FULL GLASS OF WATER  AND ON AN EMPTY STOMACH 08/16/23   [name], MD  ALPRAZolam  (XANAX ) 0.5 MG tablet TAKE 1 TABLET BY MOUTH EVERY NIGHT AT BEDTIME. MAY TAKE 1/2 TABLET DURING THE DAY AS NEEDED 09/14/24   [name], MD  Cholecalciferol (VITAMIN D3) 2000 UNITS TABS Take 2,000 Units by mouth daily.    [provider]  ELIQUIS  5 MG TABS tablet TAKE 1 TABLET(5 MG) BY MOUTH TWICE  DAILY. FOLLOW-UP 03/30/24   [name], Jerel, MD  hydrocortisone (ANUSOL-HC) 25 MG suppository Place 1 suppository (25 mg total) rectally every 12 (twelve) hours for 7 days. 09/20/24 09/27/24  Darci Pore, MD  levothyroxine  (SYNTHROID ) 125 MCG tablet TAKE 1 TABLET BY MOUTH DAILY 08/25/24   [name], MD  magnesium gluconate (MAGONATE) 500 (27 Mg) MG TABS tablet Take 1 tablet (500 mg total) by mouth daily at 6 (six) AM. Patient taking differently: Take 500 mg by mouth daily at 6 (six) AM. As needed 08/28/24   [name], MD  metoprolol  succinate (TOPROL -XL) 25 MG 24 hr tablet Take 1 tablet (25 mg total) by mouth daily. 07/13/24   Hoskins, Carolyn C, NP  Multiple Vitamin (MULTI-VITAMIN DAILY PO) Take 1 tablet by mouth daily.    [provider]  nitrofurantoin, macrocrystal-monohydrate, (MACROBID) 100 MG capsule Take 1 capsule (100 mg total) by mouth 2 (two) times daily. 09/15/24   McKenzie, Belvie CROME, MD  ondansetron  (ZOFRAN -ODT) 4 MG disintegrating tablet Take 1 tablet (4 mg total) by mouth every 8 (eight) hours as needed. 05/06/24   Leath-Warren, [name], NP  pantoprazole  (PROTONIX ) 40 MG tablet TAKE 1 TABLET(40 MG) BY MOUTH DAILY 07/24/24   Cook, Jayce G, DO   polyethylene glycol (MIRALAX) 17 g packet Take 17 g by mouth daily. 09/20/24   Darci Pore, MD  potassium chloride  (KLOR-CON  M) 20 MEQ tablet TAKE 1 TABLET (20 MEQ) BY MOUTH DAILY 08/28/24   [name], MD  pregabalin  (LYRICA ) 25 MG capsule Take 25 mg by mouth 2 (two) times daily. 08/24/24   [provider]  sertraline  (ZOLOFT ) 100 MG tablet TAKE 1 TABLET BY MOUTH EVERY DAY 02/21/24   [name], MD    Family History Family History  Problem Relation Age of Onset   Dementia Mother    Heart disease Father    Cancer Father        throat   Cancer Sister        breast   Breast cancer Sister    Lung cancer Grandson    Colon cancer Neg Hx    Lung disease Neg Hx    Autoimmune disease Neg Hx     Social History Social History   Tobacco Use   Smoking status: Never    Passive exposure: Never   Smokeless tobacco: Never   Tobacco comments:    Never smoked  Vaping Use   Vaping status: Never Used  Substance Use Topics   Alcohol use: Yes    Alcohol/week: 0.0 standard drinks of alcohol    Comment: occasional margarita   Drug use: No     Allergies   Erythromycin, Aspirin, and Ibuprofen   Review of Systems Review of Systems Per HPI  Physical Exam Triage Vital Signs ED Triage Vitals  Encounter Vitals Group     BP 09/24/24 0925 (!) 146/79     Girls Systolic BP Percentile --      Girls Diastolic BP Percentile --      Boys Systolic BP Percentile --      Boys Diastolic BP Percentile --      Pulse Rate 09/24/24 0925 71     Resp 09/24/24 0925 20     Temp 09/24/24 0925 97.9 F (36.6 C)     Temp src --      SpO2 09/24/24 0925 92 %     Weight --      Height --  Head Circumference --      Peak Flow --      Pain Score 09/24/24 0930 6     Pain Loc --      Pain Education --      Exclude from Growth Chart --    No data found.  Updated Vital Signs BP (!) 146/79 (BP Location: Right Arm)   Pulse 71   Temp 97.9 F (36.6 C)   Resp 20   SpO2  92%   Visual Acuity Right Eye Distance:   Left Eye Distance:   Bilateral Distance:    Right Eye Near:   Left Eye Near:    Bilateral Near:     Physical Exam Vitals and nursing note reviewed.  Constitutional:      General: She is not in acute distress.    Appearance: Normal appearance.  HENT:     Head: Normocephalic.  Eyes:     Extraocular Movements: Extraocular movements intact.     Conjunctiva/sclera: Conjunctivae normal.     Pupils: Pupils are equal, round, and reactive to light.  Cardiovascular:     Rate and Rhythm: Normal rate and regular rhythm.     Pulses: Normal pulses.     Heart sounds: Normal heart sounds.  Pulmonary:     Effort: Pulmonary effort is normal. No respiratory distress.     Breath sounds: Normal breath sounds. No stridor. No wheezing, rhonchi or rales.  Abdominal:     General: Bowel sounds are normal.     Palpations: Abdomen is soft.  Musculoskeletal:     Cervical back: Normal range of motion.  Skin:    General: Skin is warm and dry.  Neurological:     General: No focal deficit present.     Mental Status: She is alert and oriented to person, place, and time.  Psychiatric:        Mood and Affect: Mood normal.        Behavior: Behavior normal.      UC Treatments / Results  Labs (all labs ordered are listed, but only abnormal results are displayed) Labs Reviewed - No data to display  EKG   Radiology DG Pelvis 1-2 Views Result Date: 09/24/2024 EXAM: 1 or 2 VIEW(S) XRAY OF THE PELVIS 09/24/2024 10:07:13 AM COMPARISON: CT 929 2025. CLINICAL HISTORY: fall, pain in left buttock x 3 days. Pt fell onto left buttock 3 days ago, complaining of postior pelvis pain FINDINGS: BONES AND JOINTS: New femoral neck fracture on AP view. Lucency overlying the right acetabulum and ischium related to bowel within inguinal hernia on comparison CT. Mild degenerative changes in hip joints. Hips are located. SOFT TISSUES: Bowel within inguinal hernia, related to  lucency overlying the right acetabulum and ischium on the comparison CT. IMPRESSION: 1. New femoral neck fracture on AP view. 2. Lucency overlying the right acetabulum and ischium corresponding to bowel within an inguinal hernia on prior CT. 3. Mild degenerative changes in the hip joints. 4. No acute findings. Electronically signed by: Norleen Boxer MD 09/24/2024 10:34 AM EDT RP Workstation: [id number]    Procedures Procedures (including critical care time)  Medications Ordered in UC Medications - No data to display  Initial Impression / Assessment and Plan / UC Course  I have reviewed the triage vital signs and the nursing notes.  Pertinent labs & imaging results that were available during my care of the patient were reviewed by me and considered in my medical decision making (see chart for details).  X-ray  of the pelvis shows a new femoral neck fracture on the AP view.  Patient was advised it is recommended that she go to the emergency department for further evaluation.  The patient and her son are in agreement with this plan of care and verbalized understanding.  Patient's vital signs are stable, she is able to travel via private vehicle.  Patient discharged to the emergency department.   Final Clinical Impressions(s) / UC Diagnoses   Final diagnoses:  Pain in left buttock   Discharge Instructions   None    ED Prescriptions   None    PDMP not reviewed this encounter.   Gilmer [name], NP 09/24/24 1058

## 2024-09-24 NOTE — ED Provider Notes (Signed)
 [place] [facility]ERGENCY DEPARTMENT AT [facility] Provider Note   CSN: [id number] Arrival date & time: 09/24/24  1059     Patient presents with: [name] is a 88 y.o. female with a history of vaginal cancer, hypertension, hypothyroidism, osteopenia, presents with concern for a fall that occurred 3 days ago.  States she was walking from the couch to her bed when her legs seem to give out on her and she fell onto her tailbone.  She denies feeling any chest pain, dizziness, shortness of breath, lightheadedness before this fall.  She did hit her head, but did not lose consciousness.  She is on anticoagulation.  She denies any headaches, vision changes, or nausea or vomiting since this incident.  She reports most the pain is in her left hip.  She denies pain elsewhere in the arms and legs, or back.  She denies any shortness of breath or pain with inspiration.  She has been ambulating without difficulty.  She reports that pain in her hip seems to get better with ambulation.  She was seen at urgent care earlier today and told she may have a femoral neck fracture and to come to the ER for further evaluation.    Fall Pertinent negatives include no shortness of breath.       Prior to Admission medications   Medication Sig Start Date End Date Taking? Authorizing Provider  acetaminophen  (TYLENOL ) 650 MG CR tablet Take 650 mg by mouth every 8 (eight) hours as needed for pain.    [provider]  albuterol  (VENTOLIN  HFA) 108 (90 Base) MCG/ACT inhaler INHALE 2 PUFFS INTO THE LUNGS EVERY 6 HOURS AS NEEDED FOR WHEEZING OR SHORTNESS OF BREATH 05/29/24   [name], MD  alendronate  (FOSAMAX ) 70 MG tablet TAKE 1 TABLET(70 MG) BY MOUTH EVERY 7 DAYS WITH A FULL GLASS OF WATER  AND ON AN EMPTY STOMACH 08/16/23   Luking, Glendia LABOR, MD  ALPRAZolam  (XANAX ) 0.5 MG tablet TAKE 1 TABLET BY MOUTH EVERY NIGHT AT BEDTIME. MAY TAKE 1/2 TABLET DURING THE DAY AS NEEDED 09/14/24   [name], MD  Cholecalciferol (VITAMIN D3) 2000 UNITS TABS Take 2,000 Units by mouth daily.    [provider]  ELIQUIS  5 MG TABS tablet TAKE 1 TABLET(5 MG) BY MOUTH TWICE DAILY. FOLLOW-UP 03/30/24   [name], Jerel, MD  hydrocortisone (ANUSOL-HC) 25 MG suppository Place 1 suppository (25 mg total) rectally every 12 (twelve) hours for 7 days. 09/20/24 09/27/24  Darci Pore, MD  levothyroxine  (SYNTHROID ) 125 MCG tablet TAKE 1 TABLET BY MOUTH DAILY 08/25/24   [name], MD  magnesium gluconate (MAGONATE) 500 (27 Mg) MG TABS tablet Take 1 tablet (500 mg total) by mouth daily at 6 (six) AM. Patient taking differently: Take 500 mg by mouth daily at 6 (six) AM. As needed 08/28/24   [name], MD  metoprolol  succinate (TOPROL -XL) 25 MG 24 hr tablet Take 1 tablet (25 mg total) by mouth daily. 07/13/24   Hoskins, Carolyn C, NP  Multiple Vitamin (MULTI-VITAMIN DAILY PO) Take 1 tablet by mouth daily.    [provider]  nitrofurantoin, macrocrystal-monohydrate, (MACROBID) 100 MG capsule Take 1 capsule (100 mg total) by mouth 2 (two) times daily. 09/15/24   McKenzie, Belvie CROME, MD  ondansetron  (ZOFRAN -ODT) 4 MG disintegrating tablet Take 1 tablet (4 mg total) by mouth every 8 (eight) hours as needed. 05/06/24   Leath-Warren, [name], NP  pantoprazole  (PROTONIX ) 40 MG  tablet TAKE 1 TABLET(40 MG) BY MOUTH DAILY 07/24/24   Cook, Jayce G, DO  polyethylene glycol (MIRALAX) 17 g packet Take 17 g by mouth daily. 09/20/24   Darci Pore, MD  potassium chloride  (KLOR-CON  M) 20 MEQ tablet TAKE 1 TABLET (20 MEQ) BY MOUTH DAILY 08/28/24   [name], MD  pregabalin  (LYRICA ) 25 MG capsule Take 25 mg by mouth 2 (two) times daily. 08/24/24   [provider]  sertraline  (ZOLOFT ) 100 MG tablet TAKE 1 TABLET BY MOUTH EVERY DAY 02/21/24   [name], MD    Allergies: Erythromycin, Aspirin, and Ibuprofen    Review of Systems  Respiratory:  Negative for shortness  of breath.   Musculoskeletal:        Left hip pain    Updated Vital Signs BP (!) 189/78   Pulse 64   Temp 97.6 F (36.4 C) (Oral)   Resp 16   Ht 5' 5.5 (1.664 m)   Wt 81.6 kg   SpO2 100%   BMI 29.50 kg/m   Physical Exam Vitals and nursing note reviewed.  Constitutional:      General: She is not in acute distress.    Appearance: She is well-developed.  HENT:     Head: Normocephalic and atraumatic.     Comments: No hematomas, lacerations, or abrasions. No bruising Eyes:     Conjunctiva/sclera: Conjunctivae normal.  Cardiovascular:     Rate and Rhythm: Normal rate and regular rhythm.     Heart sounds: No murmur heard.    Comments: 1+ pedal pulse bilaterallty Pulmonary:     Effort: Pulmonary effort is normal. No respiratory distress.     Breath sounds: Normal breath sounds.  Abdominal:     Palpations: Abdomen is soft.     Tenderness: There is no abdominal tenderness.  Musculoskeletal:        General: No swelling.     Cervical back: Neck supple.     Comments: General No obvious deformity. No erythema,  contusions, open wounds. 1+ pitting edema in the legs bilaterally  Palpation Non-tender to palpation of the clavicles,humerus, radius and ulna, carpal bones, 1st-5th metacarpals and phalanges bilaterally Non tender over the femur, patella, tibia or fibula bilaterally  Non-tender over the cervical, thoracic, or lumbar spinous processes. Non-tender to palpation of the paraspinal region of the back or chest wall diffusely  No tenderness of the pelvis diffusely  ROM Full ROM of shoulders bilaterally Full elbow, wrist, knee flexion and extension bilaterally Intact plantarflexion and dorsiflexion, hip flexion bilaterally Ambulates without difficulty  Sensation: Sensation intact throughout the bilateral upper and lower extremity  Strength: 5/5 strength with resisted elbow and wrist flexion and extension bilaterally 5/5 strength with resisted knee flexion and extension  and ankle plantarflexion and dorsiflexion bilaterally    Skin:    General: Skin is warm and dry.     Capillary Refill: Capillary refill takes less than 2 seconds.  Neurological:     Mental Status: She is alert.  Psychiatric:        Mood and Affect: Mood normal.     (all labs ordered are listed, but only abnormal results are displayed) Labs Reviewed  CBC WITH DIFFERENTIAL/PLATELET - Abnormal; Notable for the following components:      Result Value   WBC 10.7 (*)    RBC 2.50 (*)    Hemoglobin 8.5 (*)    HCT 26.1 (*)    MCV 104.4 (*)    Abs Immature Granulocytes 0.09 (*)  All other components within normal limits  COMPREHENSIVE METABOLIC PANEL WITH GFR - Abnormal; Notable for the following components:   Glucose, Bld 114 (*)    All other components within normal limits  CK - Abnormal; Notable for the following components:   Total CK 260 (*)    All other components within normal limits  TROPONIN T, HIGH SENSITIVITY - Abnormal; Notable for the following components:   Troponin T High Sensitivity 38 (*)    All other components within normal limits  TROPONIN T, HIGH SENSITIVITY - Abnormal; Notable for the following components:   Troponin T High Sensitivity 37 (*)    All other components within normal limits    EKG: None  Radiology: CT PELVIS WO CONTRAST Result Date: 09/24/2024 CLINICAL DATA:  Pain after fall.  Question fracture on radiograph EXAM: CT PELVIS WITHOUT CONTRAST TECHNIQUE: Multidetector CT imaging of the pelvis was performed following the standard protocol without intravenous contrast. RADIATION DOSE REDUCTION: This exam was performed according to the departmental dose-optimization program which includes automated exposure control, adjustment of the mA and/or kV according to patient size and/or use of iterative reconstruction technique. COMPARISON:  Pelvis radiograph earlier today. Abdominopelvic CT 08/28/2024 FINDINGS: Urinary Tract:  Diffuse bladder wall thickening.  Bowel: Right inguinal hernia contains loops of small bowel. No obstruction or bowel inflammation. Small volume of formed stool in the colon. Colonic diverticulosis. Vascular/Lymphatic: Aorto bi-iliac atherosclerosis. No pelvic adenopathy. Reproductive: Known vaginal mass is not well assessed on this unenhanced exam. Hysterectomy. Other: Generalized edema in the pelvis likely related to radiation. There is mild generalized subcutaneous edema anteriorly. Musculoskeletal: The femoral neck fracture on radiograph is not confirmed by CT. There is no hip dislocation, mild hip osteoarthritis. Degenerative change of the pubic symphysis. No displaced sacral fracture. Prominent degenerative change of the pubic symphysis. IMPRESSION: 1. The femoral neck fracture on radiograph is not confirmed by CT. No pelvic fracture by CT. 2. Known vaginal mass is not well assessed on this unenhanced exam. 3. Right inguinal hernia contains loops of small bowel. No obstruction or bowel inflammation. 4. Diffuse bladder wall thickening, likely related to radiation. Aortic Atherosclerosis (ICD10-I70.0). Electronically Signed   By: Andrea Gasman M.D.   On: 09/24/2024 13:46   CT Head Wo Contrast Result Date: 09/24/2024 EXAM: CT HEAD WITHOUT CONTRAST 09/24/2024 01:23:00 PM TECHNIQUE: CT of the head was performed without the administration of intravenous contrast. Automated exposure control, iterative reconstruction, and/or weight based adjustment of the mA/kV was utilized to reduce the radiation dose to as low as reasonably achievable. COMPARISON: CT head without contrast 07/16/2022. CLINICAL HISTORY: fall on thinners. Table formatting from the original note was not included.; Pt states she fell x 3 days ago landing on her tailbone and hitting her head ; ; Pt is on eliquis ; ; Pt went to UC and was told she has a crack in her pelvis FINDINGS: BRAIN AND VENTRICLES: No acute hemorrhage. No evidence of acute infarct. No hydrocephalus. No  extra-axial collection. No mass effect or midline shift. Proportional prominence of ventricles and sulci, consistent with diffuse cerebral parenchymal volume loss. Periventricular and subcortical white matter hypoattenuation, consistent with moderate chronic ischemic microvascular disease. Calcified atherosclerotic plaque within cavernous/supraclinoid Internal Carotid Arteries. ORBITS: Status post bilateral lens replacement. SINUSES: No acute abnormality. SOFT TISSUES AND SKULL: No acute soft tissue abnormality. No skull fracture. IMPRESSION: 1. No acute intracranial abnormality. 2. Diffuse cerebral parenchymal volume loss and moderate chronic ischemic microvascular disease. Electronically signed by: Lonni Necessary MD 09/24/2024 01:34  PM EDT RP Workstation: [id number]   DG Pelvis 1-2 Views Result Date: 09/24/2024 EXAM: 1 or 2 VIEW(S) XRAY OF THE PELVIS 09/24/2024 10:07:13 AM COMPARISON: CT 929 2025. CLINICAL HISTORY: fall, pain in left buttock x 3 days. Pt fell onto left buttock 3 days ago, complaining of postior pelvis pain FINDINGS: BONES AND JOINTS: New femoral neck fracture on AP view. Lucency overlying the right acetabulum and ischium related to bowel within inguinal hernia on comparison CT. Mild degenerative changes in hip joints. Hips are located. SOFT TISSUES: Bowel within inguinal hernia, related to lucency overlying the right acetabulum and ischium on the comparison CT. IMPRESSION: 1. New femoral neck fracture on AP view. 2. Lucency overlying the right acetabulum and ischium corresponding to bowel within an inguinal hernia on prior CT. 3. Mild degenerative changes in the hip joints. 4. No acute findings. Electronically signed by: Norleen Boxer MD 09/24/2024 10:34 AM EDT RP Workstation: [id number]     Procedures   Medications Ordered in the ED - No data to display                                  Medical Decision Making Amount and/or Complexity of Data Reviewed Labs:  ordered. Radiology: ordered.   Differential diagnosis includes but is not limited to fracture, dislocation, sprain, strain, contusion, laceration, nerve injury, vascular injury, compartment syndrome  ED Course:  Upon initial evaluation, patient is well-appearing, no acute distress.  She is reporting some pain in her left hip after the fall.  On exam, she is not significantly tender to the left pelvis or femur.  Nontender in the right pelvis or femur.  Nontender of the upper extremities.  Nontender over the lower extremities.  She has full range of motion, able to ambulate without difficulty.  She is neurovascularly intact in the lower extremities.  No neurologic deficits on exam.  Denies any headache, vision changes, nausea or vomiting.  However, we will proceed with CT head given the head trauma and patient being on Eliquis .  Will also obtain CT pelvis to evaluate for the possible femoral neck fracture noted on x-ray imaging at urgent care.  Lower clinical concern given no tenderness over this area and patient able to ambulate. Will obtain basic labs to evaluate for any potential causes to patient's fall  Labs Ordered: I Ordered, and personally interpreted labs.  The pertinent results include:   CBC with mild leukocytosis at 10.7.  Hemoglobin low at 8.5, this appears to be her baseline. CMP within normal limits Troponin remained flat at an initial value of 38, and repeat of 37 CK mildly elevated at 260  Imaging Studies ordered: I ordered imaging studies including CT head, CT pelvis I independently visualized the imaging with scope of interpretation limited to determining acute life threatening conditions related to emergency care. Imaging showed  IMPRESSION:  1. The femoral neck fracture on radiograph is not confirmed by CT.  No pelvic fracture by CT.  2. Known vaginal mass is not well assessed on this unenhanced exam.  3. Right inguinal hernia contains loops of small bowel. No  obstruction  or bowel inflammation.  4. Diffuse bladder wall thickening, likely related to radiation.   I agree with the radiologist interpretation   Medications Given: None  Imaging was reviewed which revealed no pelvic fracture noted on CT.  No femoral fracture or pelvic fracture noted on patient's CT.  Given she does not  have any tenderness in this area on exam, able to ambulate, I doubt any occult fracture.  CT head without any signs of intracranial hemorrhage.  Unclear cause to her fall.  However, she has been ambulating and doing her normal activities at home without difficulty since this fall and feels at baseline.  Her labs are reassuring.  She does not have any significant electrolyte abnormalities.  No elevations in LFTs or creatinine.  Her CBC does show a hemoglobin of 8.5, but this appears to be at baseline.  Lower concern for cardiac cause given she denies any chest pain, shortness of breath, dizziness before or after this fall. There was no syncope. EKG is with normal sinus rhythm.  Troponins flat.  CK with mild elevation at 260, but no concern for rhabdomyolysis at this time given no significant elevation.  No fever, tachycardia, to suggest infection.  Patient stable and appropriate for discharge home.     Impression: Fall  Disposition:  Patient discharged home with instructions to follow-up with her PCP for recheck of symptoms within the next week and further management of her blood pressure and anemia. Tylenol  and ibuprofen as needed for pain.  Return precautions given and patient verbalized understanding.    Record Review: External records from outside source obtained and reviewed including urgent care note and x-ray from earlier today.     This chart was dictated using voice recognition software, Dragon. Despite the best efforts of this provider to proofread and correct errors, errors may still occur which can change documentation meaning.       Final diagnoses:  Fall,  initial encounter  Anemia, unspecified type    ED Discharge Orders     None          Veta Palma, DEVONNA 09/24/24 1633    Melvenia Motto, MD 09/24/24 1640

## 2024-09-24 NOTE — ED Notes (Signed)
  at bedside

## 2024-09-24 NOTE — ED Triage Notes (Signed)
 Pt states she fell x 3 days ago landing on her tailbone and hitting her head   Pt is on eliquis   Pt went to UC and was told she has a crack in her pelvis

## 2024-09-24 NOTE — Discharge Instructions (Addendum)
 Your workup today is reassuring.  The CT scan of your pelvis did not show any fractures.  The CT scan of your head did not show any skull fracture or brain bleed.  You have a low hemoglobin which is when your blood counts, but this appears  stable over the past couple of blood draws. Please have your PCP or cancer doctor continue to monitor this for you.  Your electrolytes and kidney function were normal today.  Your cardiac enzyme (troponin) was normal today. Your EKG which is a measure of the heart's electrical activity and rhythm is normal today. These would both show abnormalities if you were having a heart attack.  Your blood pressure was elevated here today at 189/78. Aim to get 30 minutes of exercise daily and limit intake of processed/fast foods. Please take and record your blood pressures at home. Take them at the same time every day. Follow up with your PCP within the next month to discuss if you may need treatment for your blood pressure.  Please return to the ER for any severe headaches, persistent vomiting, vision changes, chest pain, shortness of breath, episodes of passing out, any other new or concerning symptoms

## 2024-09-24 NOTE — ED Triage Notes (Addendum)
 Pt reports she fell out of nowhere hitting her head on the floor and her tailbone  x 3 days  Pt is on eliquis   Denies LOC at time of fall

## 2024-09-24 NOTE — Discharge Instructions (Signed)
Please go to the emergency department for further evaluation.

## 2024-09-25 ENCOUNTER — Ambulatory Visit
Admission: RE | Admit: 2024-09-25 | Discharge: 2024-09-25 | Disposition: A | Discharge status: Home / Self Care | Source: Ambulatory Visit | Attending: Radiation Oncology | Admitting: Radiation Oncology

## 2024-09-25 ENCOUNTER — Encounter: Payer: Self-pay | Admitting: Radiation Oncology

## 2024-09-25 VITALS — BP 183/70 | HR 71 | Temp 97.6°F | Resp 18 | Ht 65.5 in | Wt 188.4 lb

## 2024-09-25 DIAGNOSIS — C52 Malignant neoplasm of vagina: Secondary | ICD-10-CM

## 2024-09-25 HISTORY — DX: Personal history of irradiation: Z92.3

## 2024-09-25 NOTE — Progress Notes (Signed)
 [name] is here today for follow up post radiation to the pelvic.  They completed their radiation on: 07/05/2024  Does the patient complain of any of the following:  Pain: Yes, she reports some soreness. Abdominal bloating: Yes Diarrhea/Constipation: Denies Nausea/Vomiting: Denies Vaginal Discharge: Melena Blood in Urine or Stool: Denies Urinary Issues (dysuria/incomplete emptying/ incontinence/ increased frequency/urgency): Denies Does patient report using vaginal dilator 2-3 times a week and/or sexually active 2-3 weeks:   Post radiation skin changes: Denies   Additional comments if applicable:[id number] she had been having intermittent episodes of diarrhea and melena.  BP (!) 178/86 (BP Location: Left Arm, Patient Position: Sitting, Cuff Size: Large)   Pulse 73   Temp 97.8 F (36.6 C)   Resp 20   Ht 5' 5.5 (1.664 m)   Wt 188 lb 6.4 oz (85.5 kg)   SpO2 96%   BMI 30.87 kg/m

## 2024-09-25 NOTE — Progress Notes (Shared)
 Radiation Oncology         [phone] ________________________________  Name: [name] MRN: [id number]  Date: 09/25/2024  DOB: 1936/03/03  Follow-Up Visit Note  CC: [name], MD  [name], MD    ICD-10-CM   1. Vaginal cancer (HCC)  C52        Diagnosis: The encounter diagnosis was Vaginal cancer (HCC).   Vaginal cancer (HCC) FIGO Stage IVA (cT4, cN1, cM0); s/p EBRT completed on 08/25/2024  Interval Since Last Radiation:  1 month and 1 day   Treatment Intent: Curative   Radiation Treatment Dates:   First Treatment Date: 2024-05-23 - Last Treatment Date: 2024-07-05 Boost: First Treatment Date: 2024-08-21 -- Last Treatment Date: 2024-08-25  Site/Dose/Technique/Mode:   Plan Name: Pelvis_Bst Site: Vagina Technique: IMRT Mode: Photon Dose Per Fraction: 2 Gy Prescribed Dose (Delivered / Prescribed): 10 Gy / 10 Gy Prescribed Fxs (Delivered / Prescribed): 5 / 5   Plan Name: Pelvis Site: Vagina Technique: IMRT Mode: Photon Dose Per Fraction: 1.8 Gy Prescribed Dose (Delivered / Prescribed): 45 Gy / 45 Gy Prescribed Fxs (Delivered / Prescribed): 25 / 25  Narrative:  The patient returns today for routine follow-up. Side effects/symptoms reported during radiation therapy included dysuria and not being able to void regularly throughout her treatment. An in and out cath was subsequently performed on the date of her final radiation treatment and 350 mL of urine was collected. UA initially showed abnormal findings however her culture came back negative.   After completing her primary course of XRT the pelvis, she presented for an MRI of the pelvis with and without contrast on 07/10/24 which demonstrated: an interval decrease in masslike thickening involving the apex of the vaginal cuff and anterior vaginal wall (appearing inseparable from the posterior bladder wall), consistent with a treatment response; very extensive soft tissue edema and inflammatory  stranding throughout the low pelvis likely related to local radiation changes; circumferential wall thickening and mucosal edema of the rectum likely related to radiation proctitis; and diffuse urinary bladder wall thickening likely related to radiation cystitis. Findings also included bilateral inguinal hernias, containing nonobstructed small bowel in the right and fat on the left.  She did also have a pelvic US  performed by her PCP on 09/08 in the setting of urinary retention. US  findings were extremely limited due to overlying bowel gas. Cultures were however obtained at that time and showed evidence of UTI. She was accordingly started on a course of doxycycline .  She has unfortunately had several hospital encounters since completing radiation therapy (detailed as follows);  -- She presented to the ED on 09/29 after not being able to urinate on her own for 10 days. A foley cath was placed in the ED which yielded a large output. Labs obtained were also notable for hypokalemia and she was accordingly given potassium supplementation.  Imaging performed in the ED included a CT AP which showed no acute findings overall in the abdomen or pelvis. Findings favoring radiation changes were also present, including some minimal soft tissue prominence of the vaginal vault containing a focus of air, more stranding of the adjacent pelvic fat with small amount of free pelvic fluid present, and ill defined definition of the bladder wall with stranding of the adjacent fat.  -- Of note: She was referred to urology on 10/15 for her urinary symptoms and started on Gemtesa. -- She was hospitalized from 09/17/24 through 09/20/24 after presenting to the ED with several days of vaginal bleeding (  requiring several pads daily), abdominal cramping, dark stools, and rectal bleeding. Hospital course consistent of a colonoscopy and EGD on 10/21, with procedural findings including a 9 cm hiatal hernia with few cameron ulcers, and  non-bleeding internal hemorrhoids. She received IV Protonix  as well as potassium and magnesium supplementation (in the setting of hypokalemia and hypomagnesemia).   With regards to her vaginal bleeding, she has been advised to follow up with gyn-onc for further evaluation. She has also established care with GI; upon record review, she had been having intermittent episodes of diarrhea and melena for several months prior to her hospitalization.   In very recent history, she presented to an urgent care facility yesterday after suffering a fall 4 days ago in her home. A pelvic x-ray was obtained at that time which showed evidence of a new femoral neck fracture. Other notable findings included an area of lucency overlying the right acetabulum and ischium corresponding to bowel within an inguinal hernia demonstrated on her prior CT. Given x-ray findings showing concern for a femoral fracture, she presented to the ED that day for further evaluation. A CT of the pelvis was performed which showed no evidence of femoral neck fracture to correlate with x-ray findings. CT findings also included the right inguinal hernia containing loops of small bowel, and diffuse bladder wall thickening likely related to radiation changes. Although she did not hit her head during her fall, a CT of the head was also performed in the ED which showed no acute intracranial findings.   Today, the patient notes around brown vaginal spotting since completing her radiation treatment.  She states that since yesterday she has noticed dark red vaginal bleeding, which is different from her baseline.  Aside from her buttock pain from her fall couple days ago, she also notes lower abdominal pain that she has been taking Tylenol  for. She denies any chest pain or shortness of breath.                                 Allergies:  is allergic to erythromycin, aspirin, and ibuprofen.  Meds: Current Outpatient Medications  Medication Sig Dispense  Refill   acetaminophen  (TYLENOL ) 650 MG CR tablet Take 650 mg by mouth every 8 (eight) hours as needed for pain.     albuterol  (VENTOLIN  HFA) 108 (90 Base) MCG/ACT inhaler INHALE 2 PUFFS INTO THE LUNGS EVERY 6 HOURS AS NEEDED FOR WHEEZING OR SHORTNESS OF BREATH 18 g 1   alendronate  (FOSAMAX ) 70 MG tablet TAKE 1 TABLET(70 MG) BY MOUTH EVERY 7 DAYS WITH A FULL GLASS OF WATER  AND ON AN EMPTY STOMACH 4 tablet 3   ALPRAZolam  (XANAX ) 0.5 MG tablet TAKE 1 TABLET BY MOUTH EVERY NIGHT AT BEDTIME. MAY TAKE 1/2 TABLET DURING THE DAY AS NEEDED 45 tablet 2   Cholecalciferol (VITAMIN D3) 2000 UNITS TABS Take 2,000 Units by mouth daily.     ELIQUIS  5 MG TABS tablet TAKE 1 TABLET(5 MG) BY MOUTH TWICE DAILY. FOLLOW-UP 180 tablet 1   hydrocortisone (ANUSOL-HC) 25 MG suppository Place 1 suppository (25 mg total) rectally every 12 (twelve) hours for 7 days. 14 suppository 0   levothyroxine  (SYNTHROID ) 125 MCG tablet TAKE 1 TABLET BY MOUTH DAILY 90 tablet 1   magnesium gluconate (MAGONATE) 500 (27 Mg) MG TABS tablet Take 1 tablet (500 mg total) by mouth daily at 6 (six) AM. (Patient taking differently: Take 500 mg by mouth daily at  6 (six) AM. As needed) 30 tablet 2   metoprolol  succinate (TOPROL -XL) 25 MG 24 hr tablet Take 1 tablet (25 mg total) by mouth daily. 90 tablet 1   Multiple Vitamin (MULTI-VITAMIN DAILY PO) Take 1 tablet by mouth daily.     nitrofurantoin, macrocrystal-monohydrate, (MACROBID) 100 MG capsule Take 1 capsule (100 mg total) by mouth 2 (two) times daily. 14 capsule 0   ondansetron  (ZOFRAN -ODT) 4 MG disintegrating tablet Take 1 tablet (4 mg total) by mouth every 8 (eight) hours as needed. 20 tablet 0   pantoprazole  (PROTONIX ) 40 MG tablet TAKE 1 TABLET(40 MG) BY MOUTH DAILY 90 tablet 1   polyethylene glycol (MIRALAX) 17 g packet Take 17 g by mouth daily. 14 each 0   potassium chloride  (KLOR-CON  M) 20 MEQ tablet TAKE 1 TABLET (20 MEQ) BY MOUTH DAILY 30 tablet 2   pregabalin  (LYRICA ) 25 MG capsule  Take 25 mg by mouth 2 (two) times daily.     sertraline  (ZOLOFT ) 100 MG tablet TAKE 1 TABLET BY MOUTH EVERY DAY 90 tablet 1   No current facility-administered medications for this encounter.    Physical Findings: The patient is in no acute distress. Patient is alert and oriented.  height is 5' 5.5 (1.664 m) and weight is 188 lb 6.4 oz (85.5 kg). Her temperature is 97.6 F (36.4 C). Her blood pressure is 183/70 (abnormal) and her pulse is 71. Her respiration is 18 and oxygen saturation is 98%. .  No significant changes. Lungs are clear to auscultation bilaterally. Heart has regular rate and rhythm. No palpable cervical, supraclavicular, or axillary adenopathy. Abdomen soft, non-tender, normal bowel sounds. Bilateral peripheral edema.***  Patient declined pelvic examination today.    Lab Findings: Lab Results  Component Value Date   WBC 10.7 (H) 09/24/2024   HGB 8.5 (L) 09/24/2024   HCT 26.1 (L) 09/24/2024   MCV 104.4 (H) 09/24/2024   PLT 368 09/24/2024    Radiographic Findings: CT PELVIS WO CONTRAST Result Date: 09/24/2024 CLINICAL DATA:  Pain after fall.  Question fracture on radiograph EXAM: CT PELVIS WITHOUT CONTRAST TECHNIQUE: Multidetector CT imaging of the pelvis was performed following the standard protocol without intravenous contrast. RADIATION DOSE REDUCTION: This exam was performed according to the departmental dose-optimization program which includes automated exposure control, adjustment of the mA and/or kV according to patient size and/or use of iterative reconstruction technique. COMPARISON:  Pelvis radiograph earlier today. Abdominopelvic CT 08/28/2024 FINDINGS: Urinary Tract:  Diffuse bladder wall thickening. Bowel: Right inguinal hernia contains loops of small bowel. No obstruction or bowel inflammation. Small volume of formed stool in the colon. Colonic diverticulosis. Vascular/Lymphatic: Aorto bi-iliac atherosclerosis. No pelvic adenopathy. Reproductive: Known vaginal  mass is not well assessed on this unenhanced exam. Hysterectomy. Other: Generalized edema in the pelvis likely related to radiation. There is mild generalized subcutaneous edema anteriorly. Musculoskeletal: The femoral neck fracture on radiograph is not confirmed by CT. There is no hip dislocation, mild hip osteoarthritis. Degenerative change of the pubic symphysis. No displaced sacral fracture. Prominent degenerative change of the pubic symphysis. IMPRESSION: 1. The femoral neck fracture on radiograph is not confirmed by CT. No pelvic fracture by CT. 2. Known vaginal mass is not well assessed on this unenhanced exam. 3. Right inguinal hernia contains loops of small bowel. No obstruction or bowel inflammation. 4. Diffuse bladder wall thickening, likely related to radiation. Aortic Atherosclerosis (ICD10-I70.0). Electronically Signed   By: Andrea Gasman M.D.   On: 09/24/2024 13:46   CT Head Wo  Contrast Result Date: 09/24/2024 EXAM: CT HEAD WITHOUT CONTRAST 09/24/2024 01:23:00 PM TECHNIQUE: CT of the head was performed without the administration of intravenous contrast. Automated exposure control, iterative reconstruction, and/or weight based adjustment of the mA/kV was utilized to reduce the radiation dose to as low as reasonably achievable. COMPARISON: CT head without contrast 07/16/2022. CLINICAL HISTORY: fall on thinners. Table formatting from the original note was not included.; Pt states she fell x 3 days ago landing on her tailbone and hitting her head ; ; Pt is on eliquis ; ; Pt went to UC and was told she has a crack in her pelvis FINDINGS: BRAIN AND VENTRICLES: No acute hemorrhage. No evidence of acute infarct. No hydrocephalus. No extra-axial collection. No mass effect or midline shift. Proportional prominence of ventricles and sulci, consistent with diffuse cerebral parenchymal volume loss. Periventricular and subcortical white matter hypoattenuation, consistent with moderate chronic ischemic  microvascular disease. Calcified atherosclerotic plaque within cavernous/supraclinoid Internal Carotid Arteries. ORBITS: Status post bilateral lens replacement. SINUSES: No acute abnormality. SOFT TISSUES AND SKULL: No acute soft tissue abnormality. No skull fracture. IMPRESSION: 1. No acute intracranial abnormality. 2. Diffuse cerebral parenchymal volume loss and moderate chronic ischemic microvascular disease. Electronically signed by: Lonni Necessary MD 09/24/2024 01:34 PM EDT RP Workstation: [id number]   DG Pelvis 1-2 Views Result Date: 09/24/2024 EXAM: 1 or 2 VIEW(S) XRAY OF THE PELVIS 09/24/2024 10:07:13 AM COMPARISON: CT 929 2025. CLINICAL HISTORY: fall, pain in left buttock x 3 days. Pt fell onto left buttock 3 days ago, complaining of postior pelvis pain FINDINGS: BONES AND JOINTS: New femoral neck fracture on AP view. Lucency overlying the right acetabulum and ischium related to bowel within inguinal hernia on comparison CT. Mild degenerative changes in hip joints. Hips are located. SOFT TISSUES: Bowel within inguinal hernia, related to lucency overlying the right acetabulum and ischium on the comparison CT. IMPRESSION: 1. New femoral neck fracture on AP view. 2. Lucency overlying the right acetabulum and ischium corresponding to bowel within an inguinal hernia on prior CT. 3. Mild degenerative changes in the hip joints. 4. No acute findings. Electronically signed by: Norleen Boxer MD 09/24/2024 10:34 AM EDT RP Workstation: [id number]   CT Renal Stone Study Result Date: 08/28/2024 CLINICAL DATA:  Oliguria 10 days. Abdominal pain and discomfort. Patient reports symptoms began before finishing radiation therapy for vaginal cancer. EXAM: CT ABDOMEN AND PELVIS WITHOUT CONTRAST TECHNIQUE: Multidetector CT imaging of the abdomen and pelvis was performed following the standard protocol without IV contrast. RADIATION DOSE REDUCTION: This exam was performed according to the departmental dose-optimization  program which includes automated exposure control, adjustment of the mA and/or kV according to patient size and/or use of iterative reconstruction technique. COMPARISON:  MRI pelvis 07/10/2024, PET-CT 04/20/2024 FINDINGS: Lower chest: Borderline cardiomegaly. Calcified plaque over the descending thoracic aorta. Moderate size hiatal hernia. Lung bases demonstrate no acute airspace disease or effusion. Hepatobiliary: Previous cholecystectomy. Liver and biliary tree are normal. Pancreas: Normal. Spleen: Normal. Adrenals/Urinary Tract: Adrenal glands are normal. Kidneys are normal in size with minimal fullness of the intrarenal collecting systems. No nephrolithiasis. Right renal cyst unchanged is no follow-up imaging recommended. Ureters are normal. Foley catheter is present within a decompressed bladder. There is ill definition of the bladder wall with stranding of the adjacent fat likely due to post radiation changes. Stomach/Bowel: Moderate size hiatal hernia unchanged. Multiple surgical clips over the hiatal hernia and stomach as well as upper abdomen. Small bowel is unremarkable. Colon is unremarkable. Appendix is not visualized.  Vascular/Lymphatic: Minimal calcified plaque over the abdominal aorta which is normal caliber. Remaining vascular structures are unremarkable. No significant adenopathy. Reproductive: Uterus and bilateral adnexa are unremarkable. Minimal soft tissue prominence of the vaginal vault containing a focus of air as these findings are slightly more prominent compared to the prior exam. There is more stranding of the adjacent pelvic fat with small amount of free pelvic fluid present. Findings likely due to post radiation change. Other: Moderate size right inguinal hernia containing a short segment of small bowel unchanged. Musculoskeletal: No focal abnormality. IMPRESSION: 1. No acute findings in the abdomen/pelvis. 2. Minimal soft tissue prominence of the vaginal vault containing a focus of air  as these findings are slightly more prominent compared to the prior exam. There is more stranding of the adjacent pelvic fat with small amount of free pelvic fluid present. Findings likely due to post radiation change. 3. Foley catheter within a decompressed bladder. There is ill definition of the bladder wall with stranding of the adjacent fat likely due to post radiation changes. Cystitis/infection could also produce these findings. Recommend clinical correlation. 4. Moderate size hiatal hernia unchanged. 5. Moderate size right inguinal hernia containing a short segment of small bowel unchanged. 6. Aortic atherosclerosis. Aortic Atherosclerosis (ICD10-I70.0). Electronically Signed   By: Toribio Agreste M.D.   On: 08/28/2024 16:58    Impression/Plans:   Vaginal cancer (HCC) FIGO Stage IVA (cT4, cN1, cM0); s/p EBRT with boost to the vaginal mass completed on 08/25/2024  The patient is recovering from the effects of radiation. She continues to experience vaginal bleeding. Most recent pelvic CT performed in the ER yesterday shows no obvious signs of disease progression; however, this was limited due to not having contrast. Her symptoms unfortunately are not entirely unexpected, considering the extent of her disease prior to palliative radiation. We will continue to monitor her symptoms closely to help with management. We will share our discussion today with Dr. Viktoria and have the patient follow-up with her in 1 month. Patient was advised to call us  with any worsening bleeding in the interim. ***  She is scheduled to see Dr. Lonn on 10/03/2024.   Hypertension  Patient's BP significantly elevated today. She remains asymptomatic. Advised patient to check BP at home and follow-up with PCP tomorrow if BP remains elevated.     *** minutes of total time was spent for this patient encounter, including preparation, face-to-face counseling with the patient and coordination of care, physical exam, and  documentation of the encounter. ____________________________________   Leeroy Due, PA-C   Lynwood CHARM Nasuti, PhD, MD   [facility]  Radiation Oncology Direct Dial: [phone]  Fax: [phone] [email or web address]     This document serves as a record of services personally performed by Lynwood Nasuti, MD. It was created on his behalf by Dorthy Fuse, a trained medical scribe. The creation of this record is based on the scribe's personal observations and the provider's statements to them. This document has been checked and approved by the attending provider.

## 2024-09-26 ENCOUNTER — Ambulatory Visit: Payer: Self-pay

## 2024-09-26 NOTE — Telephone Encounter (Signed)
Noted, patient scheduled.

## 2024-09-26 NOTE — Telephone Encounter (Signed)
 FYI Only or Action Required?: FYI only for provider.  Patient was last seen in primary care on 08/31/2024 by Cook, Jayce G, DO.  Called Nurse Triage reporting Hypertension.  Symptoms began yesterday.  Interventions attempted: Prescription medications: Metoprolol  .  Symptoms are: unchanged.  Triage Disposition: See PCP When Office is Open (Within 3 Days)  Patient/caregiver understands and will follow disposition?: Yes  **Appt. Scheduled for 10/29**       Copied from CRM #[id number]. Topic: Clinical - Red Word Triage >> Sep 26, 2024  8:26 AM Avram MATSU wrote: Red Word that prompted transfer to Nurse Triage: 192/80 BP Reason for Disposition  Systolic BP >= 160 OR Diastolic >= 100  Answer Assessment - Initial Assessment Questions 1. BLOOD PRESSURE: What is your blood pressure? Did you take at least two measurements 5 minutes apart?     192/80 BP in office with oncology, was referred to PCP  2. ONSET: When did you take your blood pressure?     Yesterday during oncology appointment   3. HOW: How did you take your blood pressure? (e.g., automatic home BP monitor, visiting nurse)     Facility   4. HISTORY: Do you have a history of high blood pressure?     HTN  5. MEDICINES: Are you taking any medicines for blood pressure? Have you missed any doses recently?     Metoprolol  25mg , no missed doses   6. OTHER SYMPTOMS: Do you have any symptoms? (e.g., blurred vision, chest pain, difficulty breathing, headache, weakness) No.   Patient is asymptomatic But was advised to follow up with PCP per oncology team. She also has an 11/4 hospital follow-up appt. That she plans to attend as well.  .  Protocols used: Blood Pressure - High-A-AH

## 2024-09-27 ENCOUNTER — Ambulatory Visit (INDEPENDENT_AMBULATORY_CARE_PROVIDER_SITE_OTHER): Payer: Self-pay | Admitting: Family Medicine

## 2024-09-27 ENCOUNTER — Encounter: Payer: Self-pay | Admitting: Family Medicine

## 2024-09-27 VITALS — BP 142/73 | HR 58 | Temp 98.1°F | Ht 65.5 in | Wt 188.1 lb

## 2024-09-27 DIAGNOSIS — C52 Malignant neoplasm of vagina: Secondary | ICD-10-CM

## 2024-09-27 DIAGNOSIS — D509 Iron deficiency anemia, unspecified: Secondary | ICD-10-CM | POA: Diagnosis not present

## 2024-09-27 DIAGNOSIS — E876 Hypokalemia: Secondary | ICD-10-CM | POA: Diagnosis not present

## 2024-09-27 DIAGNOSIS — R600 Localized edema: Secondary | ICD-10-CM | POA: Diagnosis not present

## 2024-09-27 MED ORDER — POTASSIUM CHLORIDE CRYS ER 20 MEQ PO TBCR: EXTENDED_RELEASE_TABLET | ORAL | 5 refills | Status: DC

## 2024-09-27 MED ORDER — TORSEMIDE 20 MG PO TABS: 20.0000 mg | ORAL_TABLET | Freq: Every day | ORAL | 5 refills | Status: DC

## 2024-09-27 NOTE — Progress Notes (Signed)
 Subjective:    Patient ID: [name], female    DOB: 03/04/1936, 88 y.o.   MRN: [id number]  HPI Pt states she had high blood pressure at the cancer doctor yesterday it was 191/80 and 192/80 the doctor told her she needed to be seen right soon. She is having swollen legs per pt my legs are big as a football I've never had legs this big.Legs are sore have been sore for 1 week. She had a colonoscopy and endoscopy last week and states only a nurse told her she had Hemorids on the inside, the doctor has not told her the results from the procedures. Thursday she came home from hospital and fell when she got home. She went to urgent care and then got sent to ER because urgent care told her she had a crack in her tailbone. She went to ER and was there for 6 hours she got told she had no crack but her tailbone is sore and she takes tylenol  to help with the pain. She has bled twice from her vagina and then one time it was just light brown. Pt said the kidney doctor said that the radiation has done her in.  Discussed the use of AI scribe software for clinical note transcription with the patient, who gave verbal consent to proceed.  History of Present Illness   [name] is an 88 year old female who presents with leg swelling and high blood pressure.  She has significant swelling in her legs, affecting her ability to wear most shoes.  She has a history of cancer and underwent an endoscopy and colonoscopy in mid-October, which showed internal hemorrhoids but no cancer in the sigmoid or colon. She was hospitalized for three days due to severe bleeding, which has since improved. Her bowel movements are normalizing, and there is no recent blood in her stools.  She experiences vaginal bleeding, described as dark and sometimes resembling 'brown, dried blood.' She changes her pad every hour.  Her blood pressure was recently very high at 191/192 but has improved to 140/82. She feels 'washed out' with  low energy levels, though she remains active around the house.  She does not take iron or B12 supplements but is currently taking magnesium, potassium, a multivitamin, vitamin D, and thyroid  medication.      Review of Systems     Objective:   Physical Exam  General-in no acute distress Eyes-no discharge Lungs-respiratory rate normal, CTA CV-no murmurs,RRR Extremities skin warm dry no edema Neuro grossly normal Behavior normal, alert  Fluid overload with lower extremity edema and congestive heart failure Significant fluid retention causing severe lower extremity edema, likely due to fluid overload. Echocardiogram showed good cardiac function. - Initiated torsemide once daily in the morning. - Increased potassium supplementation to twice daily. - Ordered blood tests to assess underlying causes of fluid retention. - Scheduled follow-up in four weeks.  Hypertension Recent elevated blood pressure readings, current reading improved but requires monitoring. - Monitor blood pressure regularly. - Adjust antihypertensive medications as needed.  Vaginal bleeding, ongoing Ongoing vaginal bleeding with dark blood, requires further evaluation due to cancer history. - Continue current medication to manage bleeding. - Consult with specialists if bleeding persists.  Anemia Ongoing anemia, possibly due to chronic blood loss or nutritional deficiencies. - Consider evaluating for iron and B12 deficiencies.  Internal hemorrhoids Internal hemorrhoids identified, no symptoms of bleeding or prolapse.  Hypothyroidism Well-managed on current thyroid  medication. - Continue current thyroid  medication.  Assessment & Plan:  1. Hypokalemia (Primary) Recheck metabolic 7 next week await the results  2. Vaginal cancer [facility]) Patient has follow-up visits with her specialist  3. Pedal edema Start torsemide 20 mg Increase potassium to twice daily Check lab work on Monday We will need to  follow potassium closely she has a history of low potassium - Basic Metabolic Panel (BMET) - B Nat Peptide - Hepatic function panel  4. Iron deficiency anemia, unspecified iron deficiency anemia type Check lab work as follow-up - CBC with Differential/Platelet

## 2024-09-28 DIAGNOSIS — D62 Acute posthemorrhagic anemia: Secondary | ICD-10-CM

## 2024-10-02 ENCOUNTER — Encounter: Payer: Self-pay | Admitting: Radiology

## 2024-10-02 DIAGNOSIS — D509 Iron deficiency anemia, unspecified: Secondary | ICD-10-CM | POA: Diagnosis not present

## 2024-10-02 DIAGNOSIS — R600 Localized edema: Secondary | ICD-10-CM | POA: Diagnosis not present

## 2024-10-03 ENCOUNTER — Inpatient Hospital Stay

## 2024-10-03 ENCOUNTER — Ambulatory Visit (INDEPENDENT_AMBULATORY_CARE_PROVIDER_SITE_OTHER): Admitting: Family Medicine

## 2024-10-03 ENCOUNTER — Inpatient Hospital Stay: Admitting: Hematology and Oncology

## 2024-10-03 ENCOUNTER — Ambulatory Visit: Payer: Self-pay | Admitting: Family Medicine

## 2024-10-03 ENCOUNTER — Encounter: Payer: Self-pay | Admitting: Family Medicine

## 2024-10-03 VITALS — BP 114/68 | HR 83 | Temp 97.5°F | Ht 65.5 in | Wt 176.0 lb

## 2024-10-03 DIAGNOSIS — R600 Localized edema: Secondary | ICD-10-CM

## 2024-10-03 DIAGNOSIS — D6481 Anemia due to antineoplastic chemotherapy: Secondary | ICD-10-CM | POA: Diagnosis not present

## 2024-10-03 DIAGNOSIS — I10 Essential (primary) hypertension: Secondary | ICD-10-CM

## 2024-10-03 DIAGNOSIS — C52 Malignant neoplasm of vagina: Secondary | ICD-10-CM

## 2024-10-03 DIAGNOSIS — E876 Hypokalemia: Secondary | ICD-10-CM | POA: Diagnosis not present

## 2024-10-03 LAB — CBC WITH DIFFERENTIAL/PLATELET
Basophils Absolute: 0 x10E3/uL (ref 0.0–0.2)
Basos: 0 %
EOS (ABSOLUTE): 0.1 x10E3/uL (ref 0.0–0.4)
Eos: 1 %
Hematocrit: 28.3 % — ABNORMAL LOW (ref 34.0–46.6)
Hemoglobin: 8.7 g/dL — ABNORMAL LOW (ref 11.1–15.9)
Immature Grans (Abs): 0.1 x10E3/uL (ref 0.0–0.1)
Immature Granulocytes: 1 %
Lymphocytes Absolute: 2.7 x10E3/uL (ref 0.7–3.1)
Lymphs: 23 %
MCH: 33.3 pg — ABNORMAL HIGH (ref 26.6–33.0)
MCHC: 30.7 g/dL — ABNORMAL LOW (ref 31.5–35.7)
MCV: 108 fL — ABNORMAL HIGH (ref 79–97)
Monocytes Absolute: 0.8 x10E3/uL (ref 0.1–0.9)
Monocytes: 7 %
Neutrophils Absolute: 8.1 x10E3/uL — ABNORMAL HIGH (ref 1.4–7.0)
Neutrophils: 68 %
Platelets: 387 x10E3/uL (ref 150–450)
RBC: 2.61 x10E6/uL — CL (ref 3.77–5.28)
RDW: 13.8 % (ref 11.7–15.4)
WBC: 11.8 x10E3/uL — ABNORMAL HIGH (ref 3.4–10.8)

## 2024-10-03 LAB — BASIC METABOLIC PANEL WITH GFR
BUN/Creatinine Ratio: 18 (ref 12–28)
BUN: 19 mg/dL (ref 8–27)
CO2: 17 mmol/L — ABNORMAL LOW (ref 20–29)
Calcium: 9.2 mg/dL (ref 8.7–10.3)
Chloride: 95 mmol/L — ABNORMAL LOW (ref 96–106)
Creatinine, Ser: 1.07 mg/dL — ABNORMAL HIGH (ref 0.57–1.00)
Glucose: 70 mg/dL (ref 70–99)
Potassium: 4.1 mmol/L (ref 3.5–5.2)
Sodium: 138 mmol/L (ref 134–144)
eGFR: 51 mL/min/1.73 — ABNORMAL LOW (ref >59)

## 2024-10-03 LAB — HEPATIC FUNCTION PANEL
ALT: 9 IU/L (ref 0–32)
AST: 18 IU/L (ref 0–40)
Albumin: 3.9 g/dL (ref 3.7–4.7)
Alkaline Phosphatase: 88 IU/L (ref 48–129)
Bilirubin Total: 0.3 mg/dL (ref 0.0–1.2)
Bilirubin, Direct: 0.13 mg/dL (ref 0.00–0.40)
Total Protein: 6.8 g/dL (ref 6.0–8.5)

## 2024-10-03 LAB — BRAIN NATRIURETIC PEPTIDE: BNP: 120.9 pg/mL — ABNORMAL HIGH (ref 0.0–100.0)

## 2024-10-03 NOTE — Progress Notes (Signed)
 Subjective:    Patient ID: [name], female    DOB: 1936-06-17, 88 y.o.   MRN: [id number]  HPI Follow up pedal swelling is better, feels weak Discussed the use of AI scribe software for clinical note transcription with the patient, who gave verbal consent to proceed.  History of Present Illness   [name] is an 88 year old female who presents for follow-up regarding fluid retention and medication management.  She experiences swelling in her legs, particularly in her ankles, which she describes as 'big' and uncomfortable. She attributes this to fluid retention. She is currently taking torsemide, a diuretic, at a dose of 20 mg, but did not take it on the morning of the visit. She also notes a noticeable smell in her urine, which she attributes to being 'backed up'.  Her appetite is poor, and she is concerned about her weight. Her typical breakfast includes eggs and toast or cereal such as shredded wheat, Cheerios, or Raisin Bran, often with blueberries and bananas. Lunch usually consists of peanut butter crackers or a sandwich, which she sometimes shares with her dog. Dinner is prepared by others and includes a variety of foods like pork chops and chili beans. She primarily drinks water  and cranberry juice.  She has a history of cancer treatment, including radiation and chemotherapy with carboplatin . She mentions having multiple doctors, including a kidney doctor and an oncologist, and has an upcoming appointment with a heart doctor and other specialists. She is currently taking two potassium pills daily, which she finds difficult to swallow.  She is active in her home environment, engaging in activities like gardening with her grandson's help. She has a supportive social environment, including a pet dog that she finds comforting.       Review of Systems     Objective:   Physical Exam General-in no acute distress Eyes-no discharge Lungs-respiratory rate normal, CTA CV-no  murmurs,RRR Extremities skin warm dry much less edema Neuro grossly normal Behavior normal, alert  Assessment and Plan    Edema Likely due to decreased protein levels from previous treatments, causing fluid retention. Current management with torsemide is effective but may be contributing to kidney strain. - Reduced torsemide dose to half a tablet daily. - Monitor for any worsening of symptoms and contact the office if issues arise.  Chronic kidney disease Slight increase in kidney function markers, likely due to diuretic use. No immediate concerns but requires monitoring. - Reduced torsemide dose to half a tablet daily to minimize kidney strain. - Will monitor kidney function during follow-up appointments.  Anemia Hemoglobin levels are well-managed at 8.7, slightly lower than previous levels but not requiring intervention. Likely related to previous cancer treatments affecting red blood cell production. - Continue to monitor hemoglobin levels during follow-up appointments. - Ensure adequate protein intake in diet.  Essential hypertension Blood pressure is well-controlled at 120/64 mmHg.      Results for orders placed or performed in visit on 09/27/24  Basic Metabolic Panel (BMET)   Collection Time: 10/02/24  9:34 AM  Result Value Ref Range   Glucose 70 70 - 99 mg/dL   BUN 19 8 - 27 mg/dL   Creatinine, Ser 8.92 (H) 0.57 - 1.00 mg/dL   eGFR 51 (L) >40 fO/fpw/8.26   BUN/Creatinine Ratio 18 12 - 28   Sodium 138 134 - 144 mmol/L   Potassium 4.1 3.5 - 5.2 mmol/L   Chloride 95 (L) 96 - 106 mmol/L   CO2 17 (L)  20 - 29 mmol/L   Calcium 9.2 8.7 - 10.3 mg/dL  B Nat Peptide   Collection Time: 10/02/24  9:34 AM  Result Value Ref Range   BNP WILL FOLLOW   Hepatic function panel   Collection Time: 10/02/24  9:34 AM  Result Value Ref Range   Total Protein 6.8 6.0 - 8.5 g/dL   Albumin 3.9 3.7 - 4.7 g/dL   Bilirubin Total 0.3 0.0 - 1.2 mg/dL   Bilirubin, Direct 9.86 0.00 - 0.40 mg/dL    Alkaline Phosphatase 88 48 - 129 IU/L   AST 18 0 - 40 IU/L   ALT 9 0 - 32 IU/L  CBC with Differential/Platelet   Collection Time: 10/02/24  9:34 AM  Result Value Ref Range   WBC 11.8 (H) 3.4 - 10.8 x10E3/uL   RBC 2.61 (LL) 3.77 - 5.28 x10E6/uL   Hemoglobin 8.7 (L) 11.1 - 15.9 g/dL   Hematocrit 71.6 (L) 65.9 - 46.6 %   MCV 108 (H) 79 - 97 fL   MCH 33.3 (H) 26.6 - 33.0 pg   MCHC 30.7 (L) 31.5 - 35.7 g/dL   RDW 86.1 88.2 - 84.5 %   Platelets 387 150 - 450 x10E3/uL   Neutrophils 68 Not Estab. %   Lymphs 23 Not Estab. %   Monocytes 7 Not Estab. %   Eos 1 Not Estab. %   Basos 0 Not Estab. %   Neutrophils Absolute 8.1 (H) 1.4 - 7.0 x10E3/uL   Lymphocytes Absolute 2.7 0.7 - 3.1 x10E3/uL   Monocytes Absolute 0.8 0.1 - 0.9 x10E3/uL   EOS (ABSOLUTE) 0.1 0.0 - 0.4 x10E3/uL   Basophils Absolute 0.0 0.0 - 0.2 x10E3/uL   Immature Granulocytes 1 Not Estab. %   Immature Grans (Abs) 0.1 0.0 - 0.1 x10E3/uL         Assessment & Plan:  1. Essential hypertension, benign (Primary) Blood pressure decent control continue current measures  2. Hypokalemia Recent potassium looks good we will repeat this again in December if her specialist does not do blood work in the meantime  3. Vaginal cancer (HCC) Follow through with appointments with specialists  4. Pedal edema Reduce torsemide to 1/2 tablet daily reduce potassium to 1 daily  5. Anemia due to antineoplastic chemotherapy Will be monitored through her specialist if they do not do additional blood work we will repeat this again in December

## 2024-10-03 NOTE — Patient Instructions (Signed)
 As we discussed today Please reduce your fluid pill to a half a tablet daily of the torsemide Reduce potassium to 1 daily  When you see your specialist they may recheck your blood work but if they do not recheck your blood work we will recheck things in early December when we see you  Should you have any troubles or problems please reach out we will be more than willing to see you sooner-take care-Dr. Glendia

## 2024-10-04 ENCOUNTER — Other Ambulatory Visit: Payer: Self-pay | Admitting: Hematology and Oncology

## 2024-10-04 DIAGNOSIS — C52 Malignant neoplasm of vagina: Secondary | ICD-10-CM

## 2024-10-04 DIAGNOSIS — D61818 Other pancytopenia: Secondary | ICD-10-CM

## 2024-10-05 ENCOUNTER — Other Ambulatory Visit: Payer: Self-pay | Admitting: Cardiovascular Disease

## 2024-10-06 NOTE — Telephone Encounter (Signed)
 Prescription refill request for Eliquis  received. Indication: A. FIB Last office visit: 04/04/24 Scr: 1.07 (EPIC 10/02/24) Age: 88 Weight: 79.8 kg  Will refill patient's medication at current dose per protocol.

## 2024-10-09 ENCOUNTER — Ambulatory Visit: Admitting: Cardiovascular Disease

## 2024-10-09 ENCOUNTER — Ambulatory Visit: Payer: Self-pay

## 2024-10-09 NOTE — Telephone Encounter (Signed)
 Appointment scheduled.

## 2024-10-09 NOTE — Telephone Encounter (Signed)
 Reason for Triage:  She is incontinent of her urine. This is getting worse. She is going through a lot of pull ups.  I was told through Teams to send this as a pink word. Please call her at [phone]. Thanks    Reason for Disposition  [1] Can't control passage of urine (i.e., urinary incontinence, wetting self) AND [2] present > 2 weeks  Answer Assessment - Initial Assessment Questions Pt states she has gone through a bag and half of pads/diapers. She states she is changing them about every hour to hour and a half as she states it is just pouring out of her. She states she stopped the fluid pill because she didn't think it was necessary if it's just coming out all the time. PT was tearful and asked have you ever heard of such a thing?. RN advised that it is common and but that we can schedule an appt so she can discuss with Dr. Alphonsa about what the next steps are. She states she can't keep doing this all the time.      1. SYMPTOM: What's the main symptom you're concerned about? (e.g., frequency, incontinence)     incontinence 2. ONSET: When did the  incontinence  start?     Last week 3. PAIN: Is there any pain? If Yes, ask: How bad is it? (Scale: 1-10; mild, moderate, severe)     She states mild nagging pain on lower right side 4. CAUSE: What do you think is causing the symptoms?     Thought it was the water  pills but she stopped those she's not sure 5. OTHER SYMPTOMS: Do you have any other symptoms? (e.g., blood in urine, fever, flank pain, pain with urination)     denies  Protocols used: Urinary Symptoms-A-AH

## 2024-10-09 NOTE — Telephone Encounter (Signed)
 FYI Only or Action Required?: FYI only for provider: appointment scheduled on 11.12.25.  Patient was last seen in primary care on 10/03/2024 by Sheena Glendia LABOR, MD.  Called Nurse Triage reporting Urinary Incontinence.  Symptoms began a week ago.  Interventions attempted: Other: stopped water  pill.  Symptoms are: unchanged.  Triage Disposition: See PCP Within 2 Weeks  Patient/caregiver understands and will follow disposition?: Yes

## 2024-10-10 ENCOUNTER — Ambulatory Visit: Admitting: Gastroenterology

## 2024-10-11 ENCOUNTER — Ambulatory Visit (INDEPENDENT_AMBULATORY_CARE_PROVIDER_SITE_OTHER): Payer: Self-pay | Admitting: Family Medicine

## 2024-10-11 ENCOUNTER — Encounter: Payer: Self-pay | Admitting: Family Medicine

## 2024-10-11 ENCOUNTER — Other Ambulatory Visit (HOSPITAL_COMMUNITY)
Admission: RE | Admit: 2024-10-11 | Discharge: 2024-10-11 | Disposition: A | Discharge status: Home / Self Care | Source: Ambulatory Visit | Attending: Family Medicine | Admitting: Family Medicine

## 2024-10-11 VITALS — BP 128/80 | HR 84 | Temp 98.1°F | Ht 65.5 in | Wt 176.0 lb

## 2024-10-11 DIAGNOSIS — R339 Retention of urine, unspecified: Secondary | ICD-10-CM | POA: Diagnosis not present

## 2024-10-11 DIAGNOSIS — T451X5D Adverse effect of antineoplastic and immunosuppressive drugs, subsequent encounter: Secondary | ICD-10-CM | POA: Diagnosis not present

## 2024-10-11 DIAGNOSIS — T451X5A Adverse effect of antineoplastic and immunosuppressive drugs, initial encounter: Secondary | ICD-10-CM | POA: Insufficient documentation

## 2024-10-11 DIAGNOSIS — C52 Malignant neoplasm of vagina: Secondary | ICD-10-CM | POA: Diagnosis not present

## 2024-10-11 DIAGNOSIS — D6481 Anemia due to antineoplastic chemotherapy: Secondary | ICD-10-CM | POA: Insufficient documentation

## 2024-10-11 DIAGNOSIS — E876 Hypokalemia: Secondary | ICD-10-CM

## 2024-10-11 LAB — HEPATIC FUNCTION PANEL
ALT: 11 U/L (ref 0–44)
AST: 20 U/L (ref 15–41)
Albumin: 3.8 g/dL (ref 3.5–5.0)
Alkaline Phosphatase: 81 U/L (ref 38–126)
Bilirubin, Direct: 0.2 mg/dL (ref 0.0–0.2)
Indirect Bilirubin: 0.2 mg/dL — ABNORMAL LOW (ref 0.3–0.9)
Total Bilirubin: 0.4 mg/dL (ref 0.0–1.2)
Total Protein: 7.5 g/dL (ref 6.5–8.1)

## 2024-10-11 NOTE — Progress Notes (Addendum)
 Subjective:    Patient ID: [name], female    DOB: 1936/04/26, 88 y.o.   MRN: [id number]  HPI Discussed the use of AI scribe software for clinical note transcription with the patient, who gave verbal consent to proceed.  History of Present Illness   [name] is an 88 year old female who presents with urinary incontinence and frequent urination.  She experiences urinary incontinence, describing it as 'peeing all the time' without control, and rapidly uses large and small bags of incontinence supplies. There is no stinging sensation during urination, and the urine leaks without control. She also reports gnawing abdominal discomfort but denies nausea, fever, or chills.  She notes a decrease in energy and feels weaker, experiencing difficulty with walking and requiring a cane or walker. She is concerned about her ability to shop independently and anticipates needing assistance soon. She feels scared when walking without her cane, fearing her legs might give out.  Her appetite has decreased, and she eats minimally, typically consuming a boiled egg, bacon, cereal, a peanut butter sandwich, and sometimes toast and milk. She drinks a lot of orange juice and water , mentioning a craving for these liquids.  She has a history of cancer and recalls a previous discussion about the possibility of needing a colostomy. She mentions a past experience with a catheter due to cancer-related issues and expresses concern about the impact of cancer on her bladder function.     She does complain of pain in the right lower quadrant although not severe intermittent   Review of Systems     Objective:   Physical Exam General-in no acute distress Eyes-no discharge Lungs-respiratory rate normal, CTA CV-no murmurs,RRR Extremities skin warm dry no edema Neuro grossly normal Behavior normal, alert   Assessment and Plan    Malignant neoplasm of vagina with neurogenic bladder and urinary  incontinence Chronic urinary incontinence likely secondary to neurogenic bladder due to vaginal cancer. Discussed potential surgical intervention and palliative care options. - Ordered ultrasound to assess bladder and kidney function. - Coordinated with oncologist for upcoming appointment. - Discussed potential surgical options with oncologist.  Anemia due to antineoplastic chemotherapy Anemia secondary to chemotherapy for vaginal cancer. - Drew blood for laboratory analysis.  Hypokalemia - Drew blood for laboratory analysis.  Generalized weakness and functional decline - Arranged for wheelchair assistance to get to car.  Decreased appetite             Assessment & Plan:  1. Hypokalemia (Primary) Check potassium check lab await results - CBC with Differential - Basic Metabolic Panel (BMET) - Hepatic function panel - CBC with Differential/Platelet - Hepatic function panel - Basic Metabolic Panel (BMET) - US  Pelvis Complete; Future  2. Vaginal cancer (HCC) Patient to have lab work done to look at [facility] I am concerned that she is developing some obstruction of the bladder - CBC with Differential - Basic Metabolic Panel (BMET) - Hepatic function panel - CBC with Differential/Platelet - Hepatic function panel - Basic Metabolic Panel (BMET) - US  Pelvis Complete; Future  3. Anemia due to antineoplastic chemotherapy Check hemoglobin patient states she feels very weak when she walks around not able to eat as well as normal - CBC with Differential - Basic Metabolic Panel (BMET) - Hepatic function panel - CBC with Differential/Platelet - Hepatic function panel - Basic Metabolic Panel (BMET) - US  Pelvis Complete; Future  4. Urinary retention Check ultrasound to look at bladder and to see if there is bladder  distention or any signs of hydronephrosis - CBC with Differential - Basic Metabolic Panel (BMET) - Hepatic function panel - CBC with Differential/Platelet - Hepatic  function panel - Basic Metabolic Panel (BMET) - US  Pelvis Complete; Future  So in her situation I am concerned about the possibility of a bladder outlet obstruction versus atonic bladder.  I cannot be certain if there is issues with cancer going on causing this or possibly scarring from radiation.  May or may not need suprapubic catheter or permanent indwelling catheter.  Ultrasound is set up for tomorrow afternoon Lab work is pending so far liver profile looks good.  She will see oncology tomorrow for their evaluation.  She has an appointment with urology later this month but that may need to be moved up depending on the results of these other tests.  Unfortunately lab at the hospital ran liver profile but did not run metabolic 7 or CBC-she will be seeing oncology today I have sent them a message regarding this They will be able to run additional test which will be helpful

## 2024-10-12 ENCOUNTER — Inpatient Hospital Stay: Attending: Gynecologic Oncology

## 2024-10-12 ENCOUNTER — Encounter: Payer: Self-pay | Admitting: Hematology and Oncology

## 2024-10-12 ENCOUNTER — Ambulatory Visit (HOSPITAL_COMMUNITY)
Admission: RE | Admit: 2024-10-12 | Discharge: 2024-10-12 | Disposition: A | Discharge status: Home / Self Care | Source: Ambulatory Visit | Attending: Family Medicine | Admitting: Family Medicine

## 2024-10-12 ENCOUNTER — Inpatient Hospital Stay: Admitting: Hematology and Oncology

## 2024-10-12 ENCOUNTER — Ambulatory Visit: Payer: Self-pay | Admitting: Family Medicine

## 2024-10-12 ENCOUNTER — Other Ambulatory Visit (HOSPITAL_BASED_OUTPATIENT_CLINIC_OR_DEPARTMENT_OTHER): Payer: Self-pay

## 2024-10-12 ENCOUNTER — Other Ambulatory Visit (HOSPITAL_COMMUNITY): Payer: Self-pay

## 2024-10-12 VITALS — BP 122/56 | HR 70 | Temp 98.2°F | Resp 18 | Ht 65.5 in | Wt 176.8 lb

## 2024-10-12 DIAGNOSIS — N1330 Unspecified hydronephrosis: Secondary | ICD-10-CM | POA: Diagnosis not present

## 2024-10-12 DIAGNOSIS — N281 Cyst of kidney, acquired: Secondary | ICD-10-CM | POA: Insufficient documentation

## 2024-10-12 DIAGNOSIS — R339 Retention of urine, unspecified: Secondary | ICD-10-CM | POA: Insufficient documentation

## 2024-10-12 DIAGNOSIS — D539 Nutritional anemia, unspecified: Secondary | ICD-10-CM

## 2024-10-12 DIAGNOSIS — T451X5A Adverse effect of antineoplastic and immunosuppressive drugs, initial encounter: Secondary | ICD-10-CM

## 2024-10-12 DIAGNOSIS — R770 Abnormality of albumin: Secondary | ICD-10-CM

## 2024-10-12 DIAGNOSIS — D6481 Anemia due to antineoplastic chemotherapy: Secondary | ICD-10-CM | POA: Insufficient documentation

## 2024-10-12 DIAGNOSIS — C52 Malignant neoplasm of vagina: Secondary | ICD-10-CM | POA: Insufficient documentation

## 2024-10-12 DIAGNOSIS — R32 Unspecified urinary incontinence: Secondary | ICD-10-CM

## 2024-10-12 DIAGNOSIS — Z923 Personal history of irradiation: Secondary | ICD-10-CM | POA: Diagnosis not present

## 2024-10-12 DIAGNOSIS — D61818 Other pancytopenia: Secondary | ICD-10-CM

## 2024-10-12 DIAGNOSIS — E876 Hypokalemia: Secondary | ICD-10-CM | POA: Insufficient documentation

## 2024-10-12 LAB — CBC WITH DIFFERENTIAL/PLATELET
Abs Immature Granulocytes: 0.11 K/uL — ABNORMAL HIGH (ref 0.00–0.07)
Basophils Absolute: 0 K/uL (ref 0.0–0.1)
Basophils Relative: 0 %
Eosinophils Absolute: 0.1 K/uL (ref 0.0–0.5)
Eosinophils Relative: 1 %
HCT: 23.9 % — ABNORMAL LOW (ref 36.0–46.0)
Hemoglobin: 7.9 g/dL — ABNORMAL LOW (ref 12.0–15.0)
Immature Granulocytes: 1 %
Lymphocytes Relative: 21 %
Lymphs Abs: 2.4 K/uL (ref 0.7–4.0)
MCH: 34.1 pg — ABNORMAL HIGH (ref 26.0–34.0)
MCHC: 33.1 g/dL (ref 30.0–36.0)
MCV: 103 fL — ABNORMAL HIGH (ref 80.0–100.0)
Monocytes Absolute: 0.9 K/uL (ref 0.1–1.0)
Monocytes Relative: 8 %
Neutro Abs: 8.3 K/uL — ABNORMAL HIGH (ref 1.7–7.7)
Neutrophils Relative %: 69 %
Platelets: 437 K/uL — ABNORMAL HIGH (ref 150–400)
RBC: 2.32 MIL/uL — ABNORMAL LOW (ref 3.87–5.11)
RDW: 14.3 % (ref 11.5–15.5)
WBC: 11.9 K/uL — ABNORMAL HIGH (ref 4.0–10.5)
nRBC: 0 % (ref 0.0–0.2)

## 2024-10-12 LAB — COMPREHENSIVE METABOLIC PANEL WITH GFR
ALT: 9 U/L (ref 0–44)
AST: 16 U/L (ref 15–41)
Albumin: 3.3 g/dL — ABNORMAL LOW (ref 3.5–5.0)
Alkaline Phosphatase: 64 U/L (ref 38–126)
Anion gap: 8 (ref 5–15)
BUN: 16 mg/dL (ref 8–23)
CO2: 29 mmol/L (ref 22–32)
Calcium: 8.8 mg/dL — ABNORMAL LOW (ref 8.9–10.3)
Chloride: 99 mmol/L (ref 98–111)
Creatinine, Ser: 0.98 mg/dL (ref 0.44–1.00)
GFR, Estimated: 56 mL/min — ABNORMAL LOW (ref >60)
Glucose, Bld: 102 mg/dL — ABNORMAL HIGH (ref 70–99)
Potassium: 3.7 mmol/L (ref 3.5–5.1)
Sodium: 136 mmol/L (ref 135–145)
Total Bilirubin: 0.4 mg/dL (ref 0.0–1.2)
Total Protein: 6.7 g/dL (ref 6.5–8.1)

## 2024-10-12 LAB — RETICULOCYTES
Immature Retic Fract: 31.4 % — ABNORMAL HIGH (ref 2.3–15.9)
RBC.: 2.39 MIL/uL — ABNORMAL LOW (ref 3.87–5.11)
Retic Count, Absolute: 52.6 K/uL (ref 19.0–186.0)
Retic Ct Pct: 2.2 % (ref 0.4–3.1)

## 2024-10-12 LAB — SAMPLE TO BLOOD BANK

## 2024-10-12 LAB — SEDIMENTATION RATE: Sed Rate: 100 mm/h — ABNORMAL HIGH (ref 0–22)

## 2024-10-12 LAB — ABO/RH: ABO/RH(D): O POS

## 2024-10-12 NOTE — Progress Notes (Signed)
 [facility] OFFICE PROGRESS NOTE  Patient Care Team: [name], MD as PCP - General (Family Medicine) [name], Jerel, MD as PCP - Cardiology (Cardiology) Shaaron, Lamar HERO, MD as Consulting Physician (Gastroenterology) [facility] Lonn Hicks, MD as Consulting Physician (Hematology and Oncology) Viktoria Comer SAUNDERS, MD as Consulting Physician (Gynecologic Oncology)  Assessment & Plan Vaginal cancer [facility]) 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, CARIS: P53 mutation, BRAF negative, MSI stable, low tumor mutation burden 33mut/Mb, HER2/neu score 0, negative, PD-L1 0%  She has received concurrent chemoradiation therapy with cisplatin, all treatment completed by September 2025 Her chemotherapy treatment course is complicated by anemia, mild fluid retention and mild vaginal bleeding Recent MRI show improvement of disease control I plan to repeat imaging study with PET CT scan in December for objective assessment of response to therapy Anemia due to antineoplastic chemotherapy She has multifactorial anemia We discussed risk and benefits of blood transfusion support She declined blood transfusion due to lack of symptoms Urinary incontinence, unspecified type I reviewed her recent CT imaging of the pelvis I suspect this is due to radiation cystitis Low serum albumin She has low dietary protein intake We have extensive discussions about importance of improving oral protein intake in general to promote healing  Orders Placed This Encounter  Procedures   Cervical PET Image Restage (PS) Skull Base to Thigh (F-18 FDG)    Standing Status:   Future    Expected Date:   11/24/2024    Expiration Date:   10/12/2025    If indicated for the ordered procedure, I authorize the  administration of a radiopharmaceutical per Radiology protocol:   Yes    Preferred imaging location?:   [facility]   Iron and Iron Binding Capacity ([facility] only)    Standing Status:   Future    Expected Date:   11/24/2024    Expiration Date:   10/12/2025   Ferritin    Standing Status:   Future    Expected Date:   11/24/2024    Expiration Date:   10/12/2025     Hicks Lonn, MD  INTERVAL HISTORY: she returns for surveillance follow-up after completion of treatment for vaginal cancer She is here accompanied by her granddaughter She has urinary incontinence She has minimum vaginal bleeding We discussed her recent blood work, timing of imaging and dietary protein intake  PHYSICAL EXAMINATION: ECOG PERFORMANCE STATUS: 1 - Symptomatic but completely ambulatory  Vitals:   10/12/24 1155  BP: (!) 122/56  Pulse: 70  Resp: 18  Temp: 98.2 F (36.8 C)  SpO2: 97%   Filed Weights   10/12/24 1155  Weight: 176 lb 12.8 oz (80.2 kg)    Relevant data reviewed during this visit included CBC, CMP, CT imaging of the pelvis

## 2024-10-12 NOTE — Assessment & Plan Note (Addendum)
 I reviewed her recent CT imaging of the pelvis I suspect this is due to radiation cystitis

## 2024-10-12 NOTE — Assessment & Plan Note (Addendum)
 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, CARIS: P53 mutation, BRAF negative, MSI stable, low tumor mutation burden 25mut/Mb, HER2/neu score 0, negative, PD-L1 0%  She has received concurrent chemoradiation therapy with cisplatin, all treatment completed by September 2025 Her chemotherapy treatment course is complicated by anemia, mild fluid retention and mild vaginal bleeding Recent MRI show improvement of disease control I plan to repeat imaging study with PET CT scan in December for objective assessment of response to therapy

## 2024-10-12 NOTE — Assessment & Plan Note (Addendum)
 She has multifactorial anemia We discussed risk and benefits of blood transfusion support She declined blood transfusion due to lack of symptoms

## 2024-10-12 NOTE — Assessment & Plan Note (Addendum)
 She has low dietary protein intake We have extensive discussions about importance of improving oral protein intake in general to promote healing

## 2024-10-13 ENCOUNTER — Emergency Department (HOSPITAL_COMMUNITY)

## 2024-10-13 ENCOUNTER — Other Ambulatory Visit: Payer: Self-pay

## 2024-10-13 ENCOUNTER — Emergency Department (HOSPITAL_COMMUNITY)
Admission: EM | Admit: 2024-10-13 | Discharge: 2024-10-13 | Disposition: A | Discharge status: Home / Self Care | Attending: Emergency Medicine | Admitting: Emergency Medicine

## 2024-10-13 DIAGNOSIS — N1330 Unspecified hydronephrosis: Secondary | ICD-10-CM | POA: Diagnosis not present

## 2024-10-13 DIAGNOSIS — R1031 Right lower quadrant pain: Secondary | ICD-10-CM | POA: Diagnosis not present

## 2024-10-13 DIAGNOSIS — R197 Diarrhea, unspecified: Secondary | ICD-10-CM | POA: Insufficient documentation

## 2024-10-13 DIAGNOSIS — E039 Hypothyroidism, unspecified: Secondary | ICD-10-CM | POA: Insufficient documentation

## 2024-10-13 DIAGNOSIS — I110 Hypertensive heart disease with heart failure: Secondary | ICD-10-CM | POA: Insufficient documentation

## 2024-10-13 DIAGNOSIS — I5032 Chronic diastolic (congestive) heart failure: Secondary | ICD-10-CM | POA: Diagnosis not present

## 2024-10-13 DIAGNOSIS — Z7901 Long term (current) use of anticoagulants: Secondary | ICD-10-CM | POA: Insufficient documentation

## 2024-10-13 DIAGNOSIS — Z8544 Personal history of malignant neoplasm of other female genital organs: Secondary | ICD-10-CM | POA: Diagnosis not present

## 2024-10-13 DIAGNOSIS — K4090 Unilateral inguinal hernia, without obstruction or gangrene, not specified as recurrent: Secondary | ICD-10-CM | POA: Diagnosis not present

## 2024-10-13 DIAGNOSIS — R112 Nausea with vomiting, unspecified: Secondary | ICD-10-CM | POA: Diagnosis not present

## 2024-10-13 DIAGNOSIS — K449 Diaphragmatic hernia without obstruction or gangrene: Secondary | ICD-10-CM | POA: Diagnosis not present

## 2024-10-13 DIAGNOSIS — R1084 Generalized abdominal pain: Secondary | ICD-10-CM | POA: Insufficient documentation

## 2024-10-13 DIAGNOSIS — R531 Weakness: Secondary | ICD-10-CM | POA: Diagnosis not present

## 2024-10-13 DIAGNOSIS — Z79899 Other long term (current) drug therapy: Secondary | ICD-10-CM | POA: Insufficient documentation

## 2024-10-13 LAB — CBC WITH DIFFERENTIAL/PLATELET
Abs Immature Granulocytes: 0.17 K/uL — ABNORMAL HIGH (ref 0.00–0.07)
Basophils Absolute: 0 K/uL (ref 0.0–0.1)
Basophils Relative: 0 %
Eosinophils Absolute: 0 K/uL (ref 0.0–0.5)
Eosinophils Relative: 0 %
HCT: 26.8 % — ABNORMAL LOW (ref 36.0–46.0)
Hemoglobin: 8.5 g/dL — ABNORMAL LOW (ref 12.0–15.0)
Immature Granulocytes: 1 %
Lymphocytes Relative: 13 %
Lymphs Abs: 1.9 K/uL (ref 0.7–4.0)
MCH: 33.3 pg (ref 26.0–34.0)
MCHC: 31.7 g/dL (ref 30.0–36.0)
MCV: 105.1 fL — ABNORMAL HIGH (ref 80.0–100.0)
Monocytes Absolute: 1 K/uL (ref 0.1–1.0)
Monocytes Relative: 7 %
Neutro Abs: 11.9 K/uL — ABNORMAL HIGH (ref 1.7–7.7)
Neutrophils Relative %: 79 %
Platelets: 462 K/uL — ABNORMAL HIGH (ref 150–400)
RBC: 2.55 MIL/uL — ABNORMAL LOW (ref 3.87–5.11)
RDW: 14.3 % (ref 11.5–15.5)
WBC: 15 K/uL — ABNORMAL HIGH (ref 4.0–10.5)
nRBC: 0 % (ref 0.0–0.2)

## 2024-10-13 LAB — URINALYSIS, W/ REFLEX TO CULTURE (INFECTION SUSPECTED)
Bacteria, UA: NONE SEEN
Bilirubin Urine: NEGATIVE
Glucose, UA: NEGATIVE mg/dL
Ketones, ur: NEGATIVE mg/dL
Nitrite: NEGATIVE
Protein, ur: >=300 mg/dL — AB
RBC / HPF: >50 RBC/hpf (ref 0–5)
Specific Gravity, Urine: >1.046 — ABNORMAL HIGH (ref 1.005–1.030)
WBC, UA: >50 WBC/hpf (ref 0–5)
pH: 7 (ref 5.0–8.0)

## 2024-10-13 LAB — COMPREHENSIVE METABOLIC PANEL WITH GFR
ALT: 11 U/L (ref 0–44)
AST: 20 U/L (ref 15–41)
Albumin: 3.6 g/dL (ref 3.5–5.0)
Alkaline Phosphatase: 76 U/L (ref 38–126)
Anion gap: 11 (ref 5–15)
BUN: 13 mg/dL (ref 8–23)
CO2: 28 mmol/L (ref 22–32)
Calcium: 8.9 mg/dL (ref 8.9–10.3)
Chloride: 96 mmol/L — ABNORMAL LOW (ref 98–111)
Creatinine, Ser: 0.93 mg/dL (ref 0.44–1.00)
GFR, Estimated: 60 mL/min — ABNORMAL LOW (ref >60)
Glucose, Bld: 132 mg/dL — ABNORMAL HIGH (ref 70–99)
Potassium: 3.2 mmol/L — ABNORMAL LOW (ref 3.5–5.1)
Sodium: 135 mmol/L (ref 135–145)
Total Bilirubin: 0.3 mg/dL (ref 0.0–1.2)
Total Protein: 6.9 g/dL (ref 6.5–8.1)

## 2024-10-13 LAB — TYPE AND SCREEN
ABO/RH(D): O POS
Antibody Screen: NEGATIVE

## 2024-10-13 MED ORDER — SODIUM CHLORIDE 0.9 % IV BOLUS
1000.0000 mL | Freq: Once | INTRAVENOUS | Status: AC
  Administered 2024-10-13: 1000 mL via INTRAVENOUS

## 2024-10-13 MED ORDER — CEPHALEXIN 500 MG PO CAPS: 500.0000 mg | ORAL_CAPSULE | Freq: Four times a day (QID) | ORAL | 0 refills | Status: DC

## 2024-10-13 MED ORDER — IOHEXOL 300 MG/ML  SOLN
100.0000 mL | Freq: Once | INTRAMUSCULAR | Status: AC | PRN
  Administered 2024-10-13: 100 mL via INTRAVENOUS

## 2024-10-13 MED ORDER — ONDANSETRON HCL 4 MG/2ML IJ SOLN
4.0000 mg | Freq: Once | INTRAMUSCULAR | Status: AC
  Administered 2024-10-13: 4 mg via INTRAVENOUS
  Filled 2024-10-13: qty 2

## 2024-10-13 MED ORDER — ONDANSETRON 4 MG PO TBDP: 4.0000 mg | ORAL_TABLET | Freq: Three times a day (TID) | ORAL | 0 refills | Status: DC | PRN

## 2024-10-13 MED ORDER — POTASSIUM CHLORIDE CRYS ER 20 MEQ PO TBCR
40.0000 meq | EXTENDED_RELEASE_TABLET | Freq: Once | ORAL | Status: AC
  Administered 2024-10-13: 40 meq via ORAL
  Filled 2024-10-13: qty 2

## 2024-10-13 NOTE — ED Provider Notes (Signed)
 [place] EMERGENCY DEPARTMENT AT [facility] Provider Note  CSN: [id number] Arrival date & time: 10/13/24 1703  Chief Complaint(s) Weakness  HPI [name] is a 88 y.o. female history of CHF, hypertension, vaginal cancer presenting to the emergency department with weakness.  Patient reports today having increased weakness, also with nausea and episodes of vomiting, also reports diarrhea.  No blood in the stool, no blood in the vomit.  No fevers or chills.  Currently no nausea or vomiting.  No abdominal pain.  No flank pain.  No urinary symptoms.  Reports she has constant urinary dribbling since having radiation and chemotherapy.  Saw oncologist yesterday, who felt it could be related to anemia, discussed transfusion but patient declined.  Patient contacted her primary doctor who advised ER evaluation.   Past Medical History Past Medical History:  Diagnosis Date   Arthritis    CHF (congestive heart failure) (HCC)    Chronic Leukemia    Depression    Dysrhythmia    A.fib   on Eliquis    H/O hiatal hernia    Hernia    Hernia    right inguinal hernia   History of radiation therapy    Vagina- 08/21/24-08/25/24- Dr. Lynwood Nasuti   Hypertension    Hypothyroidism    Leg swelling    Osteitis pubis 05/30/2001   Osteopenia 03/30/2009   Osteopenia 07/30/2022   Bone density 2021 showed elevated FRAX patient was started on Fosamax    Pneumonia    Thyroid  disease    Patient Active Problem List   Diagnosis Date Noted   Low serum albumin 10/12/2024   ABLA (acute blood loss anemia) 09/28/2024   Vaginal bleeding 09/18/2024   Macrocytic anemia 09/18/2024   Hypocalcemia 09/18/2024   Obesity, Class I, BMI 30-34.9 09/18/2024   Insomnia 09/18/2024   GI bleed 09/17/2024   Hypokalemia 08/31/2024   Dysuria 08/31/2024   DNR (do not resuscitate) 07/23/2024   Pancytopenia, acquired (HCC) 06/16/2024   Anemia due to antineoplastic chemotherapy 05/26/2024   Constipation 05/24/2024    Hypomagnesemia 05/16/2024   Goals of care, counseling/discussion 05/12/2024   Vaginal cancer (HCC) 04/28/2024   Idiopathic peripheral neuropathy 04/28/2024   Hearing deficit, bilateral 04/28/2024   Postmenopausal vaginal bleeding 03/29/2024   Urinary incontinence 03/29/2024   Acute cystitis with hematuria 03/08/2024   Chronic diastolic (congestive) heart failure (HCC) 01/28/2023   Lymphoproliferative disease (HCC) 01/28/2023   Idiopathic pulmonary fibrosis (HCC) 08/18/2022   Osteopenia 07/30/2022   Persistent atrial fibrillation (HCC)    Monoclonal B-cell lymphocytosis 01/21/2022   Bilateral leg edema 05/24/2015   GERD (gastroesophageal reflux disease) 07/02/2014   Hyperlipidemia 05/16/2013   Acquired hypothyroidism 05/16/2013   Essential hypertension, benign 05/16/2013   Home Medication(s) Prior to Admission medications   Medication Sig Start Date End Date Taking? Authorizing Provider  cephALEXin  (KEFLEX ) 500 MG capsule Take 1 capsule (500 mg total) by mouth 4 (four) times daily. 10/13/24  Yes Francesca Elsie CROME, MD  acetaminophen  (TYLENOL ) 650 MG CR tablet Take 650 mg by mouth every 8 (eight) hours as needed for pain.    [provider]  albuterol  (VENTOLIN  HFA) 108 (90 Base) MCG/ACT inhaler INHALE 2 PUFFS INTO THE LUNGS EVERY 6 HOURS AS NEEDED FOR WHEEZING OR SHORTNESS OF BREATH 05/29/24   [name], MD  alendronate  (FOSAMAX ) 70 MG tablet TAKE 1 TABLET(70 MG) BY MOUTH EVERY 7 DAYS WITH A FULL GLASS OF WATER  AND ON AN EMPTY STOMACH 08/16/23   [name], MD  ALPRAZolam  (XANAX ) 0.5 MG tablet TAKE 1 TABLET BY MOUTH EVERY NIGHT AT BEDTIME. MAY TAKE 1/2 TABLET DURING THE DAY AS NEEDED 09/14/24   [name], MD  Cholecalciferol (VITAMIN D3) 2000 UNITS TABS Take 2,000 Units by mouth daily.    [provider]  ELIQUIS  5 MG TABS tablet TAKE 1 TABLET(5 MG) BY MOUTH TWICE DAILY. FOLLOW-UP 10/06/24   Rana Lum CROME, NP  levothyroxine  (SYNTHROID ) 125 MCG  tablet TAKE 1 TABLET BY MOUTH DAILY 08/25/24   [name], MD  magnesium gluconate (MAGONATE) 500 (27 Mg) MG TABS tablet Take 1 tablet (500 mg total) by mouth daily at 6 (six) AM. Patient taking differently: Take 500 mg by mouth daily at 6 (six) AM. As needed 08/28/24   [name], MD  metoprolol  succinate (TOPROL -XL) 25 MG 24 hr tablet Take 1 tablet (25 mg total) by mouth daily. 07/13/24   Hoskins, Carolyn C, NP  Multiple Vitamin (MULTI-VITAMIN DAILY PO) Take 1 tablet by mouth daily.    [provider]  nitrofurantoin, macrocrystal-monohydrate, (MACROBID) 100 MG capsule Take 1 capsule (100 mg total) by mouth 2 (two) times daily. 09/15/24   McKenzie, Belvie CROME, MD  ondansetron  (ZOFRAN -ODT) 4 MG disintegrating tablet Take 1 tablet (4 mg total) by mouth every 8 (eight) hours as needed. 10/13/24   Francesca Elsie CROME, MD  pantoprazole  (PROTONIX ) 40 MG tablet TAKE 1 TABLET(40 MG) BY MOUTH DAILY 07/24/24   Cook, Jayce G, DO  polyethylene glycol (MIRALAX) 17 g packet Take 17 g by mouth daily. 09/20/24   Darci Pore, MD  potassium chloride  SA (KLOR-CON  M) 20 MEQ tablet 1 bid 09/27/24   [name], MD  pregabalin  (LYRICA ) 25 MG capsule Take 25 mg by mouth 2 (two) times daily. 08/24/24   [provider]  sertraline  (ZOLOFT ) 100 MG tablet TAKE 1 TABLET BY MOUTH EVERY DAY 02/21/24   [name], MD  torsemide (DEMADEX) 20 MG tablet Take 1 tablet (20 mg total) by mouth daily. 09/27/24   [name], MD                                                                                                                                    Past Surgical History Past Surgical History:  Procedure Laterality Date   ABDOMINAL HYSTERECTOMY  03/1970   APPENDECTOMY  03/1966   BILATERAL SALPINGOOPHORECTOMY     CARDIOVERSION N/A 03/10/2022   Procedure: CARDIOVERSION;  Surgeon: Francyne Headland, MD;  Location: [facility];  Service: Cardiovascular;  Laterality: N/A;   CATARACT  EXTRACTION W/PHACO Right 09/25/2014   Procedure: CATARACT EXTRACTION PHACO AND INTRAOCULAR LENS PLACEMENT (IOC);  Surgeon: Oneil T. Roz, MD;  Location: [facility];  Service: Ophthalmology;  Laterality: Right;  CDE:7.56   CATARACT EXTRACTION W/PHACO Left 10/09/2014   Procedure: CATARACT EXTRACTION PHACO AND INTRAOCULAR LENS PLACEMENT LEFT EYE CDE=6.51;  Surgeon: Oneil T. Roz, MD;  Location:  [facility];  Service: Ophthalmology;  Laterality: Left;   CHOLECYSTECTOMY  1980   COLONOSCOPY  1999   COLONOSCOPY  March 2007   Repeat 10 years   COLONOSCOPY  11/06/2003    Internal and external hemorrhoids/Two tiny rectal distal sigmoid polyps cold biopsied/Otherwise within normal limits to the cecum   COLONOSCOPY N/A 08/29/2014   Dr. Shaaron: Sessile polyp was found at the cecum status post cold snare polypectomy, tubular adenoma. No further surveillance unless clinical status changes   COLONOSCOPY N/A 09/19/2024   Procedure: COLONOSCOPY;  Surgeon: Eartha Angelia Sieving, MD;  Location: [facility];  Service: Gastroenterology;  Laterality: N/A;   CYSTOSCOPY N/A 05/24/2024   Procedure: CYSTOSCOPY;  Surgeon: Viktoria Comer SAUNDERS, MD;  Location: [facility];  Service: Gynecology;  Laterality: N/A;   ESOPHAGOGASTRODUODENOSCOPY  2007   Dr. Shaaron: normal esophagus, small tom oderate sized hiatal hernia, Cameron lesions   ESOPHAGOGASTRODUODENOSCOPY N/A 09/19/2024   Procedure: EGD (ESOPHAGOGASTRODUODENOSCOPY);  Surgeon: Eartha Angelia, Sieving, MD;  Location: [facility];  Service: Gastroenterology;  Laterality: N/A;   EXAM UNDER ANESTHESIA, PELVIC N/A 05/24/2024   Procedure: EXAM UNDER ANESTHESIA, PELVIC;  Surgeon: Viktoria Comer SAUNDERS, MD;  Location: [facility];  Service: Gynecology;  Laterality: N/A;   IR IMAGING GUIDED PORT INSERTION  05/23/2024   SKIN CANCER EXCISION Left 11 1 2016   left arm squamous cancer   TUMOR REMOVAL  03/1968   ovarian   Family History Family History  Problem Relation Age of Onset    Dementia Mother    Heart disease Father    Cancer Father        throat   Cancer Sister        breast   Breast cancer Sister    Lung cancer Grandson    Colon cancer Neg Hx    Lung disease Neg Hx    Autoimmune disease Neg Hx     Social History Social History   Tobacco Use   Smoking status: Never    Passive exposure: Never   Smokeless tobacco: Never   Tobacco comments:    Never smoked  Vaping Use   Vaping status: Never Used  Substance Use Topics   Alcohol use: Yes    Alcohol/week: 0.0 standard drinks of alcohol    Comment: occasional margarita   Drug use: No   Allergies Erythromycin, Aspirin, and Ibuprofen  Review of Systems Review of Systems  All other systems reviewed and are negative.   Physical Exam Vital Signs  I have reviewed the triage vital signs BP (!) 151/74   Pulse 63   Temp 98.1 F (36.7 C)   Resp 18   SpO2 96%  Physical Exam Vitals and nursing note reviewed.  Constitutional:      General: She is not in acute distress.    Appearance: She is well-developed.  HENT:     Head: Normocephalic and atraumatic.     Mouth/Throat:     Mouth: Mucous membranes are moist.  Eyes:     Pupils: Pupils are equal, round, and reactive to light.  Cardiovascular:     Rate and Rhythm: Normal rate and regular rhythm.     Heart sounds: No murmur heard. Pulmonary:     Effort: Pulmonary effort is normal. No respiratory distress.     Breath sounds: Normal breath sounds.  Abdominal:     General: Abdomen is flat.     Palpations: Abdomen is soft.     Tenderness: There is no abdominal tenderness. There  is no right CVA tenderness or left CVA tenderness.  Musculoskeletal:        General: No tenderness.     Right lower leg: No edema.     Left lower leg: No edema.  Skin:    General: Skin is warm and dry.  Neurological:     General: No focal deficit present.     Mental Status: She is alert. Mental status is at baseline.  Psychiatric:        Mood and Affect: Mood  normal.        Behavior: Behavior normal.     ED Results and Treatments Labs (all labs ordered are listed, but only abnormal results are displayed) Labs Reviewed  COMPREHENSIVE METABOLIC PANEL WITH GFR - Abnormal; Notable for the following components:      Result Value   Potassium 3.2 (*)    Chloride 96 (*)    Glucose, Bld 132 (*)    GFR, Estimated 60 (*)    All other components within normal limits  CBC WITH DIFFERENTIAL/PLATELET - Abnormal; Notable for the following components:   WBC 15.0 (*)    RBC 2.55 (*)    Hemoglobin 8.5 (*)    HCT 26.8 (*)    MCV 105.1 (*)    Platelets 462 (*)    Neutro Abs 11.9 (*)    Abs Immature Granulocytes 0.17 (*)    All other components within normal limits  URINALYSIS, W/ REFLEX TO CULTURE (INFECTION SUSPECTED) - Abnormal; Notable for the following components:   APPearance CLOUDY (*)    Specific Gravity, Urine >1.046 (*)    Hgb urine dipstick SMALL (*)    Protein, ur >=300 (*)    Leukocytes,Ua SMALL (*)    Non Squamous Epithelial 0-5 (*)    All other components within normal limits  URINE CULTURE  TYPE AND SCREEN                                                                                                                          Radiology CT ABDOMEN PELVIS W CONTRAST Result Date: 10/13/2024 EXAM: CT ABDOMEN AND PELVIS WITH CONTRAST 10/13/2024 07:48:10 PM TECHNIQUE: CT of the abdomen and pelvis was performed with the administration of 100 mL of iohexol  (OMNIPAQUE ) 300 MG/ML solution. Multiplanar reformatted images are provided for review. Automated exposure control, iterative reconstruction, and/or weight-based adjustment of the mA/kV was utilized to reduce the radiation dose to as low as reasonably achievable. COMPARISON: CT pelvis dated 09/24/2024, CT abdomen/pelvis dated 08/28/2024, PET CT 04/20/2024. CLINICAL HISTORY: RLQ abdominal pain. FINDINGS: LOWER CHEST: Moderate hiatal hernia with postsurgical changes at the GE junction. LIVER:  The liver is unremarkable. GALLBLADDER AND BILE DUCTS: Post cholecystectomy. No biliary ductal dilatation. SPLEEN: No acute abnormality. PANCREAS: No acute abnormality. ADRENAL GLANDS: No acute abnormality. KIDNEYS, URETERS AND BLADDER: No stones in the kidneys or ureters. Associated moderate left hydroureteronephrosis, new. No perinephric or periureteral stranding. GI AND BOWEL: Stomach demonstrates no acute abnormality. Prior  appendectomy. Moderate right inguinal hernia containing a knuckle of bowel, unchanged. There is no bowel obstruction. PERITONEUM AND RETROPERITONEUM: No ascites. No free air. VASCULATURE: Aorta is normal in caliber. Atherosclerotic calcifications of the abdominal aorta and branch vessels, although patent. LYMPH NODES: No lymphadenopathy. REPRODUCTIVE ORGANS: Status post hysterectomy. 7.5 x 5.2 x 6.9 cm peripherally enhancing low-density lesion along the vaginal cuff and posterior bladder dome (image 77), newly visualized/progressive from CT but at the site of hyperintensity on prior PET, suggesting vaginal cancer. BONES AND SOFT TISSUES: Mild changes within the mid lumbar spine. No acute osseous abnormality. No focal soft tissue abnormality. IMPRESSION: 1. Status post hysterectomy. 2. New/progressive 7.5 cm peripherally enhancing low-density lesion along the vaginal cuff and posterior bladder dome, corresponding to prior PET hyperintensity, suggestive of vaginal cancer. This has significantly progressed from recent priors. 3. Associated new moderate left hydroureteronephrosis. 4. Moderate right inguinal hernia containing a knuckle of bowel, unchanged. 5. Prior appendectomy. Electronically signed by: Pinkie Pebbles MD 10/13/2024 08:01 PM EST RP Workstation: [id number]    Pertinent labs & imaging results that were available during my care of the patient were reviewed by me and considered in my medical decision making (see MDM for details).  Medications Ordered in ED Medications   potassium chloride  SA (KLOR-CON  M) CR tablet 40 mEq (has no administration in time range)  sodium chloride  0.9 % bolus 1,000 mL (0 mLs Intravenous Stopped 10/13/24 2240)  ondansetron  (ZOFRAN ) injection 4 mg (4 mg Intravenous Given 10/13/24 2012)  iohexol  (OMNIPAQUE ) 300 MG/ML solution 100 mL (100 mLs Intravenous Contrast Given 10/13/24 1934)                                                                                                                                     Procedures Procedures  (including critical care time)  Medical Decision Making / ED Course   MDM:  88 year old presenting to the emergency department with weakness, vomiting.  Patient overall generally well-appearing considering underlying illness.  Physical examination without focal abnormality.  No abdominal tenderness.  Given vomiting and nausea, history of malignancy, consider intra-abdominal process.  Obtain CT scan to evaluate for acute process such as obstruction, perforation, volvulus, pancreatitis, cholecystitis.  CT scan without evidence of acute process but shows progressive underlying vaginal malignancy with subsequent new left hydronephrosis.  Hydronephrosis has been present on prior ultrasound.  Patient has no CVA tenderness.  She denies any urinary symptoms.  Discussed with Dr. Alvaro with urology who recommends outpatient follow-up and does not feel patient needs any emergent nephrostomy drainage.  Urinalysis shows significant RBC and WBC which is most likely related to patient's probable radiation cystitis with which she has previously been diagnosed.  There is no bacteria to suggest underlying infection but given the leukocytosis and weakness we will treat with Keflex  and send urine culture.  Offered observation or admission given weakness and age, however patient prefers to go home.  Recommend close follow-up with her urologist given hydronephrosis as well as her primary physician.  Discussed strict return  precautions for any fevers or chills or signs of infection.  She does report she has nausea medicine at home but she did not take this.  At the time of discharge Her nausea and vomiting have resolved.  Feel patient is stable for discharge. Will discharge patient to home. All questions answered. Patient comfortable with plan of discharge. Return precautions discussed with patient and specified on the after visit summary.      Additional history obtained: -Additional history obtained from ems -External records from outside source obtained and reviewed including: Chart review including previous notes, labs, imaging, consultation notes including pmd/onc notes    Lab Tests: -I ordered, reviewed, and interpreted labs.   The pertinent results include:   Labs Reviewed  COMPREHENSIVE METABOLIC PANEL WITH GFR - Abnormal; Notable for the following components:      Result Value   Potassium 3.2 (*)    Chloride 96 (*)    Glucose, Bld 132 (*)    GFR, Estimated 60 (*)    All other components within normal limits  CBC WITH DIFFERENTIAL/PLATELET - Abnormal; Notable for the following components:   WBC 15.0 (*)    RBC 2.55 (*)    Hemoglobin 8.5 (*)    HCT 26.8 (*)    MCV 105.1 (*)    Platelets 462 (*)    Neutro Abs 11.9 (*)    Abs Immature Granulocytes 0.17 (*)    All other components within normal limits  URINALYSIS, W/ REFLEX TO CULTURE (INFECTION SUSPECTED) - Abnormal; Notable for the following components:   APPearance CLOUDY (*)    Specific Gravity, Urine >1.046 (*)    Hgb urine dipstick SMALL (*)    Protein, ur >=300 (*)    Leukocytes,Ua SMALL (*)    Non Squamous Epithelial 0-5 (*)    All other components within normal limits  URINE CULTURE  TYPE AND SCREEN    Notable for mild hypokalemia, leukocytosis,    Imaging Studies ordered: I ordered imaging studies including CT scans On my interpretation imaging demonstrates oncologic findings  I independently visualized and interpreted  imaging. I agree with the radiologist interpretation   Medicines ordered and prescription drug management: Meds ordered this encounter  Medications   sodium chloride  0.9 % bolus 1,000 mL   ondansetron  (ZOFRAN ) injection 4 mg   iohexol  (OMNIPAQUE ) 300 MG/ML solution 100 mL   cephALEXin  (KEFLEX ) 500 MG capsule    Sig: Take 1 capsule (500 mg total) by mouth 4 (four) times daily.    Dispense:  28 capsule    Refill:  0   ondansetron  (ZOFRAN -ODT) 4 MG disintegrating tablet    Sig: Take 1 tablet (4 mg total) by mouth every 8 (eight) hours as needed.    Dispense:  20 tablet    Refill:  0   potassium chloride  SA (KLOR-CON  M) CR tablet 40 mEq    -I have reviewed the patients home medicines and have made adjustments as needed   Consultations Obtained: I requested consultation with the urologist,  and discussed lab and imaging findings as well as pertinent plan - they recommend: outpatient follow up   Cardiac Monitoring: The patient was maintained on a cardiac monitor.  I personally viewed and interpreted the cardiac monitored which showed an underlying rhythm of: NSR  Social Determinants of Health:  Diagnosis or treatment significantly limited by social determinants of health: obesity  Reevaluation: After the interventions noted above, I reevaluated the patient and found that their symptoms have improved  Co morbidities that complicate the patient evaluation  Past Medical History:  Diagnosis Date   Arthritis    CHF (congestive heart failure) (HCC)    Chronic Leukemia    Depression    Dysrhythmia    A.fib   on Eliquis    H/O hiatal hernia    Hernia    Hernia    right inguinal hernia   History of radiation therapy    Vagina- 08/21/24-08/25/24- Dr. Lynwood Nasuti   Hypertension    Hypothyroidism    Leg swelling    Osteitis pubis 05/30/2001   Osteopenia 03/30/2009   Osteopenia 07/30/2022   Bone density 2021 showed elevated FRAX patient was started on Fosamax    Pneumonia     Thyroid  disease       Dispostion: Disposition decision including need for hospitalization was considered, and patient discharged from emergency department.    Final Clinical Impression(s) / ED Diagnoses Final diagnoses:  Generalized weakness  Nausea and vomiting, unspecified vomiting type     This chart was dictated using voice recognition software.  Despite best efforts to proofread,  errors can occur which can change the documentation meaning.    Francesca Elsie CROME, MD 10/13/24 2245

## 2024-10-13 NOTE — ED Notes (Signed)
 Discharge instructions reviewed.   Newly prescribed medications discussed. Pharmacy verified.   Opportunity for questions and concerns provided.   Alert, oriented and escorted to familys vehicle via wheelchair. SABRA   Displays no signs of distress.

## 2024-10-13 NOTE — Discharge Instructions (Addendum)
 We evaluated you for your weakness, nausea and vomiting.  Your testing in the emergency department was overall reassuring.  We did see some progression of your cancer.  Your urine test did show signs of inflammation but we think this is most likely related to your radiation treatment rather than an infection.  We have covered you with an antibiotic and sent a urine culture.  Please follow-up closely with Dr. Alphonsa and Dr. Sherrilee.  If you have any new or worsening symptoms such as fevers or chills, lightheadedness or dizziness, fainting, worsening pain, uncontrolled vomiting, or any other new symptoms, please return to the emergency department.

## 2024-10-13 NOTE — ED Triage Notes (Signed)
 Pt had follow up appt and lab work with cancer doctor yesterday. Pt hgb was 7.9 and offered to have blood transfusion or go home and eat iron rich foods. Pt tried to eat and vomited. Pt denies pain. Comes of weakness and feeling tired. Took 4mg  zofran  at home with relief.

## 2024-10-13 NOTE — ED Triage Notes (Signed)
 Family member at bedside states pt is a DNR. Pt does not have a DNR with her.

## 2024-10-15 LAB — URINE CULTURE: Culture: NO GROWTH

## 2024-10-17 ENCOUNTER — Other Ambulatory Visit (HOSPITAL_COMMUNITY): Payer: Self-pay

## 2024-10-17 ENCOUNTER — Other Ambulatory Visit: Payer: Self-pay

## 2024-10-17 ENCOUNTER — Encounter: Payer: Self-pay | Admitting: Hematology and Oncology

## 2024-10-17 MED ORDER — MAGNESIUM OXIDE -MG SUPPLEMENT 400 (240 MG) MG PO TABS
1.0000 | ORAL_TABLET | Freq: Every day | ORAL | 0 refills | Status: DC
  Filled 2024-10-17: qty 90, 90d supply, fill #0

## 2024-10-23 ENCOUNTER — Ambulatory Visit: Admitting: Urology

## 2024-10-23 DIAGNOSIS — R339 Retention of urine, unspecified: Secondary | ICD-10-CM

## 2024-10-26 ENCOUNTER — Encounter (HOSPITAL_COMMUNITY): Payer: Self-pay

## 2024-10-26 ENCOUNTER — Other Ambulatory Visit: Payer: Self-pay

## 2024-10-26 ENCOUNTER — Inpatient Hospital Stay (HOSPITAL_COMMUNITY)
Admission: EM | Admit: 2024-10-26 | Discharge: 2024-10-28 | DRG: 641 | Disposition: A | Discharge status: Home / Self Care | Attending: Internal Medicine | Admitting: Internal Medicine

## 2024-10-26 DIAGNOSIS — E876 Hypokalemia: Secondary | ICD-10-CM | POA: Diagnosis not present

## 2024-10-26 DIAGNOSIS — I4819 Other persistent atrial fibrillation: Secondary | ICD-10-CM | POA: Diagnosis present

## 2024-10-26 DIAGNOSIS — M1990 Unspecified osteoarthritis, unspecified site: Secondary | ICD-10-CM | POA: Diagnosis present

## 2024-10-26 DIAGNOSIS — M8580 Other specified disorders of bone density and structure, unspecified site: Secondary | ICD-10-CM | POA: Diagnosis present

## 2024-10-26 DIAGNOSIS — Z85828 Personal history of other malignant neoplasm of skin: Secondary | ICD-10-CM

## 2024-10-26 DIAGNOSIS — Z7901 Long term (current) use of anticoagulants: Secondary | ICD-10-CM

## 2024-10-26 DIAGNOSIS — N179 Acute kidney failure, unspecified: Secondary | ICD-10-CM | POA: Diagnosis present

## 2024-10-26 DIAGNOSIS — C52 Malignant neoplasm of vagina: Secondary | ICD-10-CM | POA: Diagnosis present

## 2024-10-26 DIAGNOSIS — Z923 Personal history of irradiation: Secondary | ICD-10-CM

## 2024-10-26 DIAGNOSIS — Z881 Allergy status to other antibiotic agents status: Secondary | ICD-10-CM

## 2024-10-26 DIAGNOSIS — R1084 Generalized abdominal pain: Secondary | ICD-10-CM | POA: Diagnosis not present

## 2024-10-26 DIAGNOSIS — Z8249 Family history of ischemic heart disease and other diseases of the circulatory system: Secondary | ICD-10-CM

## 2024-10-26 DIAGNOSIS — I110 Hypertensive heart disease with heart failure: Secondary | ICD-10-CM | POA: Diagnosis present

## 2024-10-26 DIAGNOSIS — E872 Acidosis, unspecified: Secondary | ICD-10-CM | POA: Diagnosis present

## 2024-10-26 DIAGNOSIS — Z801 Family history of malignant neoplasm of trachea, bronchus and lung: Secondary | ICD-10-CM

## 2024-10-26 DIAGNOSIS — Z7983 Long term (current) use of bisphosphonates: Secondary | ICD-10-CM

## 2024-10-26 DIAGNOSIS — N390 Urinary tract infection, site not specified: Secondary | ICD-10-CM | POA: Diagnosis present

## 2024-10-26 DIAGNOSIS — K219 Gastro-esophageal reflux disease without esophagitis: Secondary | ICD-10-CM | POA: Diagnosis present

## 2024-10-26 DIAGNOSIS — E878 Other disorders of electrolyte and fluid balance, not elsewhere classified: Secondary | ICD-10-CM | POA: Diagnosis present

## 2024-10-26 DIAGNOSIS — I471 Supraventricular tachycardia, unspecified: Secondary | ICD-10-CM | POA: Diagnosis present

## 2024-10-26 DIAGNOSIS — D638 Anemia in other chronic diseases classified elsewhere: Secondary | ICD-10-CM | POA: Diagnosis present

## 2024-10-26 DIAGNOSIS — Z803 Family history of malignant neoplasm of breast: Secondary | ICD-10-CM

## 2024-10-26 DIAGNOSIS — E861 Hypovolemia: Secondary | ICD-10-CM | POA: Diagnosis present

## 2024-10-26 DIAGNOSIS — D849 Immunodeficiency, unspecified: Secondary | ICD-10-CM | POA: Diagnosis present

## 2024-10-26 DIAGNOSIS — Z79899 Other long term (current) drug therapy: Secondary | ICD-10-CM

## 2024-10-26 DIAGNOSIS — Z886 Allergy status to analgesic agent status: Secondary | ICD-10-CM

## 2024-10-26 DIAGNOSIS — Z9049 Acquired absence of other specified parts of digestive tract: Secondary | ICD-10-CM

## 2024-10-26 DIAGNOSIS — E162 Hypoglycemia, unspecified: Secondary | ICD-10-CM | POA: Diagnosis present

## 2024-10-26 DIAGNOSIS — E039 Hypothyroidism, unspecified: Secondary | ICD-10-CM | POA: Diagnosis present

## 2024-10-26 DIAGNOSIS — Z8544 Personal history of malignant neoplasm of other female genital organs: Secondary | ICD-10-CM

## 2024-10-26 DIAGNOSIS — Z79890 Hormone replacement therapy (postmenopausal): Secondary | ICD-10-CM

## 2024-10-26 DIAGNOSIS — Z9221 Personal history of antineoplastic chemotherapy: Secondary | ICD-10-CM

## 2024-10-26 LAB — CBC WITH DIFFERENTIAL/PLATELET
Abs Immature Granulocytes: 0.1 K/uL — ABNORMAL HIGH (ref 0.00–0.07)
Basophils Absolute: 0 K/uL (ref 0.0–0.1)
Basophils Relative: 0 %
Eosinophils Absolute: 0.1 K/uL (ref 0.0–0.5)
Eosinophils Relative: 0 %
HCT: 26.3 % — ABNORMAL LOW (ref 36.0–46.0)
Hemoglobin: 8.5 g/dL — ABNORMAL LOW (ref 12.0–15.0)
Immature Granulocytes: 1 %
Lymphocytes Relative: 14 %
Lymphs Abs: 1.9 K/uL (ref 0.7–4.0)
MCH: 33.3 pg (ref 26.0–34.0)
MCHC: 32.3 g/dL (ref 30.0–36.0)
MCV: 103.1 fL — ABNORMAL HIGH (ref 80.0–100.0)
Monocytes Absolute: 0.8 K/uL (ref 0.1–1.0)
Monocytes Relative: 6 %
Neutro Abs: 10.4 K/uL — ABNORMAL HIGH (ref 1.7–7.7)
Neutrophils Relative %: 79 %
Platelets: 425 K/uL — ABNORMAL HIGH (ref 150–400)
RBC: 2.55 MIL/uL — ABNORMAL LOW (ref 3.87–5.11)
RDW: 14.8 % (ref 11.5–15.5)
WBC: 13.2 K/uL — ABNORMAL HIGH (ref 4.0–10.5)
nRBC: 0 % (ref 0.0–0.2)

## 2024-10-26 LAB — POC OCCULT BLOOD, ED: Fecal Occult Bld: NEGATIVE

## 2024-10-26 MED ORDER — ONDANSETRON HCL 4 MG/2ML IJ SOLN
4.0000 mg | Freq: Once | INTRAMUSCULAR | Status: AC
  Administered 2024-10-27: 4 mg via INTRAVENOUS
  Filled 2024-10-26: qty 2

## 2024-10-26 MED ORDER — MORPHINE SULFATE (PF) 4 MG/ML IV SOLN
4.0000 mg | Freq: Once | INTRAVENOUS | Status: AC
  Administered 2024-10-27: 4 mg via INTRAVENOUS
  Filled 2024-10-26: qty 1

## 2024-10-26 MED ORDER — SODIUM CHLORIDE 0.9 % IV BOLUS
1000.0000 mL | Freq: Once | INTRAVENOUS | Status: AC
  Administered 2024-10-27: 1000 mL via INTRAVENOUS

## 2024-10-26 NOTE — ED Notes (Signed)
 Getting second person to help with port access.

## 2024-10-26 NOTE — ED Provider Notes (Signed)
 [place] EMERGENCY DEPARTMENT AT [facility] Provider Note   CSN: [id number] Arrival date & time: 10/26/24  2153     Patient presents with: Abdominal Pain   SHAKETA SERAFIN is a 88 y.o. female presents today for right lower quadrant abdominal pain since September when she finished chemo/radiation but has worsened in the last week.  Patient also reporting nausea without vomiting.    Abdominal Pain      Prior to Admission medications   Medication Sig Start Date End Date Taking? Authorizing Provider  acetaminophen  (TYLENOL ) 650 MG CR tablet Take 650 mg by mouth every 8 (eight) hours as needed for pain.    [provider]  albuterol  (VENTOLIN  HFA) 108 (90 Base) MCG/ACT inhaler INHALE 2 PUFFS INTO THE LUNGS EVERY 6 HOURS AS NEEDED FOR WHEEZING OR SHORTNESS OF BREATH 05/29/24   [name], MD  alendronate  (FOSAMAX ) 70 MG tablet TAKE 1 TABLET(70 MG) BY MOUTH EVERY 7 DAYS WITH A FULL GLASS OF WATER  AND ON AN EMPTY STOMACH 08/16/23   [name], MD  ALPRAZolam  (XANAX ) 0.5 MG tablet TAKE 1 TABLET BY MOUTH EVERY NIGHT AT BEDTIME. MAY TAKE 1/2 TABLET DURING THE DAY AS NEEDED 09/14/24   [name], MD  cephALEXin  (KEFLEX ) 500 MG capsule Take 1 capsule (500 mg total) by mouth 4 (four) times daily. 10/13/24   Francesca Elsie CROME, MD  Cholecalciferol (VITAMIN D3) 2000 UNITS TABS Take 2,000 Units by mouth daily.    [provider]  ELIQUIS  5 MG TABS tablet TAKE 1 TABLET(5 MG) BY MOUTH TWICE DAILY. FOLLOW-UP 10/06/24   Rana Lum CROME, NP  levothyroxine  (SYNTHROID ) 125 MCG tablet TAKE 1 TABLET BY MOUTH DAILY 08/25/24   [name], MD  magnesium  gluconate (MAGONATE) 500 (27 Mg) MG TABS tablet Take 1 tablet (500 mg total) by mouth daily at 6 (six) AM. Patient taking differently: Take 500 mg by mouth daily at 6 (six) AM. As needed 08/28/24   [name], MD  metoprolol  succinate (TOPROL -XL) 25 MG 24 hr tablet Take 1 tablet (25 mg total) by mouth  daily. 07/13/24   Hoskins, Carolyn C, NP  Multiple Vitamin (MULTI-VITAMIN DAILY PO) Take 1 tablet by mouth daily.    [provider]  nitrofurantoin , macrocrystal-monohydrate, (MACROBID ) 100 MG capsule Take 1 capsule (100 mg total) by mouth 2 (two) times daily. 09/15/24   McKenzie, Belvie CROME, MD  ondansetron  (ZOFRAN -ODT) 4 MG disintegrating tablet Take 1 tablet (4 mg total) by mouth every 8 (eight) hours as needed. 10/13/24   Francesca Elsie CROME, MD  pantoprazole  (PROTONIX ) 40 MG tablet TAKE 1 TABLET(40 MG) BY MOUTH DAILY 07/24/24   Cook, Jayce G, DO  polyethylene glycol (MIRALAX ) 17 g packet Take 17 g by mouth daily. 09/20/24   Darci Pore, MD  potassium chloride  SA (KLOR-CON  M) 20 MEQ tablet 1 bid 09/27/24   [name], MD  pregabalin  (LYRICA ) 25 MG capsule Take 25 mg by mouth 2 (two) times daily. 08/24/24   [provider]  sertraline  (ZOLOFT ) 100 MG tablet TAKE 1 TABLET BY MOUTH EVERY DAY 02/21/24   [name], MD  torsemide  (DEMADEX ) 20 MG tablet Take 1 tablet (20 mg total) by mouth daily. 09/27/24   [name], MD    Allergies: Erythromycin, Aspirin, and Ibuprofen    Review of Systems  Gastrointestinal:  Positive for abdominal pain.    Updated Vital Signs BP (!) 158/106 (BP Location: Left Arm)  Pulse (!) 111   Temp 98.2 F (36.8 C) (Oral)   Resp 16   Ht 5' 5.5 (1.664 m)   Wt 80.2 kg   SpO2 98%   BMI 28.97 kg/m   Physical Exam Vitals and nursing note reviewed. Exam conducted with a chaperone present.  Constitutional:      General: She is not in acute distress.    Appearance: She is well-developed. She is not toxic-appearing.  HENT:     Head: Normocephalic and atraumatic.  Eyes:     Conjunctiva/sclera: Conjunctivae normal.  Cardiovascular:     Rate and Rhythm: Regular rhythm. Tachycardia present.     Heart sounds: Normal heart sounds. No murmur heard. Pulmonary:     Effort: Pulmonary effort is normal. No respiratory distress.      Breath sounds: Normal breath sounds.  Abdominal:     General: There is no distension.     Palpations: Abdomen is soft.     Tenderness: There is abdominal tenderness in the right lower quadrant, periumbilical area, suprapubic area and left lower quadrant.  Genitourinary:    Rectum: Guaiac result negative. No anal fissure, external hemorrhoid or internal hemorrhoid. Normal anal tone.     Comments: Skin tags noted on exam Musculoskeletal:        General: No swelling.     Cervical back: Neck supple.  Skin:    General: Skin is warm and dry.     Capillary Refill: Capillary refill takes less than 2 seconds.  Neurological:     Mental Status: She is alert.  Psychiatric:        Mood and Affect: Mood normal.     (all labs ordered are listed, but only abnormal results are displayed) Labs Reviewed  CBC WITH DIFFERENTIAL/PLATELET - Abnormal; Notable for the following components:      Result Value   WBC 13.2 (*)    RBC 2.55 (*)    Hemoglobin 8.5 (*)    HCT 26.3 (*)    MCV 103.1 (*)    Platelets 425 (*)    Neutro Abs 10.4 (*)    Abs Immature Granulocytes 0.10 (*)    All other components within normal limits  COMPREHENSIVE METABOLIC PANEL WITH GFR  LIPASE, BLOOD  URINALYSIS, ROUTINE W REFLEX MICROSCOPIC  POC OCCULT BLOOD, ED    EKG: None  Radiology: No results found.   Procedures   Medications Ordered in the ED  morphine  (PF) 4 MG/ML injection 4 mg (has no administration in time range)  ondansetron  (ZOFRAN ) injection 4 mg (has no administration in time range)  sodium chloride  0.9 % bolus 1,000 mL (has no administration in time range)                                    Medical Decision Making Amount and/or Complexity of Data Reviewed Labs: ordered. Radiology: ordered.  Risk Prescription drug management.   This patient presents to the ED for concern of lower abdominal pain with nausea differential diagnosis includes metastatic disease, bowel perforation, SBO,  volvulus, diverticulitis, ulcerative colitis    Additional history obtained   Additional history obtained from Electronic Medical Record External records from outside source obtained and reviewed including family medicine and oncology notes   Lab Tests:  I Ordered, and personally interpreted labs.  The pertinent results include: Hemoccult negative, leukocytosis at 13.1, anemia 8.5 both of which are around patient's baseline, elevated platelets at  425   Imaging Studies ordered:  I ordered imaging studies including CT abdomen pelvis with contrast I independently visualized and interpreted imaging which showed Pending I agree with the radiologist interpretation   Medicines ordered and prescription drug management:  I ordered medication including morphine , IVF, and Zofran     I have reviewed the patients home medicines and have made adjustments as needed  Patient signed out to Ubaldo High, PA-C pending labs and imaging which will determine patient disposition please refer to their note for full ED course.       Final diagnoses:  None    ED Discharge Orders     None          Francis Ileana LOISE DEVONNA 10/26/24 2354    Dasie Faden, MD 10/28/24 1547

## 2024-10-26 NOTE — ED Triage Notes (Signed)
 Pt POV with family d/t ABD pain RLQ ABD since September when finished last Chemo/ Radiation but worse the last week.  Pt has Stage 4 Vaginal Cancer and states she gets pain in same place.

## 2024-10-27 ENCOUNTER — Other Ambulatory Visit: Payer: Self-pay

## 2024-10-27 ENCOUNTER — Emergency Department (HOSPITAL_COMMUNITY)

## 2024-10-27 ENCOUNTER — Encounter (HOSPITAL_COMMUNITY): Payer: Self-pay

## 2024-10-27 DIAGNOSIS — I4819 Other persistent atrial fibrillation: Secondary | ICD-10-CM | POA: Diagnosis not present

## 2024-10-27 DIAGNOSIS — Z7901 Long term (current) use of anticoagulants: Secondary | ICD-10-CM | POA: Diagnosis not present

## 2024-10-27 DIAGNOSIS — Z923 Personal history of irradiation: Secondary | ICD-10-CM | POA: Diagnosis not present

## 2024-10-27 DIAGNOSIS — Z8249 Family history of ischemic heart disease and other diseases of the circulatory system: Secondary | ICD-10-CM | POA: Diagnosis not present

## 2024-10-27 DIAGNOSIS — E876 Hypokalemia: Secondary | ICD-10-CM | POA: Diagnosis not present

## 2024-10-27 DIAGNOSIS — N179 Acute kidney failure, unspecified: Secondary | ICD-10-CM | POA: Diagnosis not present

## 2024-10-27 DIAGNOSIS — M1990 Unspecified osteoarthritis, unspecified site: Secondary | ICD-10-CM | POA: Diagnosis not present

## 2024-10-27 DIAGNOSIS — N134 Hydroureter: Secondary | ICD-10-CM | POA: Diagnosis not present

## 2024-10-27 DIAGNOSIS — E878 Other disorders of electrolyte and fluid balance, not elsewhere classified: Secondary | ICD-10-CM | POA: Diagnosis not present

## 2024-10-27 DIAGNOSIS — I471 Supraventricular tachycardia, unspecified: Secondary | ICD-10-CM | POA: Diagnosis not present

## 2024-10-27 DIAGNOSIS — E162 Hypoglycemia, unspecified: Secondary | ICD-10-CM | POA: Diagnosis not present

## 2024-10-27 DIAGNOSIS — E861 Hypovolemia: Secondary | ICD-10-CM | POA: Diagnosis not present

## 2024-10-27 DIAGNOSIS — D638 Anemia in other chronic diseases classified elsewhere: Secondary | ICD-10-CM | POA: Diagnosis not present

## 2024-10-27 DIAGNOSIS — E039 Hypothyroidism, unspecified: Secondary | ICD-10-CM | POA: Diagnosis not present

## 2024-10-27 DIAGNOSIS — N1330 Unspecified hydronephrosis: Secondary | ICD-10-CM | POA: Diagnosis not present

## 2024-10-27 DIAGNOSIS — K4090 Unilateral inguinal hernia, without obstruction or gangrene, not specified as recurrent: Secondary | ICD-10-CM | POA: Diagnosis not present

## 2024-10-27 DIAGNOSIS — Z85828 Personal history of other malignant neoplasm of skin: Secondary | ICD-10-CM | POA: Diagnosis not present

## 2024-10-27 DIAGNOSIS — M8580 Other specified disorders of bone density and structure, unspecified site: Secondary | ICD-10-CM | POA: Diagnosis not present

## 2024-10-27 DIAGNOSIS — C52 Malignant neoplasm of vagina: Secondary | ICD-10-CM | POA: Diagnosis not present

## 2024-10-27 DIAGNOSIS — I110 Hypertensive heart disease with heart failure: Secondary | ICD-10-CM | POA: Diagnosis not present

## 2024-10-27 DIAGNOSIS — E872 Acidosis, unspecified: Secondary | ICD-10-CM | POA: Diagnosis not present

## 2024-10-27 DIAGNOSIS — Z79890 Hormone replacement therapy (postmenopausal): Secondary | ICD-10-CM | POA: Diagnosis not present

## 2024-10-27 DIAGNOSIS — Z7983 Long term (current) use of bisphosphonates: Secondary | ICD-10-CM | POA: Diagnosis not present

## 2024-10-27 DIAGNOSIS — D849 Immunodeficiency, unspecified: Secondary | ICD-10-CM | POA: Diagnosis not present

## 2024-10-27 DIAGNOSIS — R1084 Generalized abdominal pain: Secondary | ICD-10-CM | POA: Diagnosis not present

## 2024-10-27 DIAGNOSIS — N390 Urinary tract infection, site not specified: Secondary | ICD-10-CM | POA: Diagnosis not present

## 2024-10-27 DIAGNOSIS — K219 Gastro-esophageal reflux disease without esophagitis: Secondary | ICD-10-CM | POA: Diagnosis not present

## 2024-10-27 LAB — COMPREHENSIVE METABOLIC PANEL WITH GFR
ALT: <5 U/L (ref 0–44)
AST: 13 U/L — ABNORMAL LOW (ref 15–41)
Albumin: 1.8 g/dL — ABNORMAL LOW (ref 3.5–5.0)
Alkaline Phosphatase: 54 U/L (ref 38–126)
Anion gap: 8 (ref 5–15)
BUN: 11 mg/dL (ref 8–23)
CO2: 18 mmol/L — ABNORMAL LOW (ref 22–32)
Calcium: 5.4 mg/dL — CL (ref 8.9–10.3)
Chloride: 117 mmol/L — ABNORMAL HIGH (ref 98–111)
Creatinine, Ser: 0.62 mg/dL (ref 0.44–1.00)
GFR, Estimated: >60 mL/min (ref >60)
Glucose, Bld: 64 mg/dL — ABNORMAL LOW (ref 70–99)
Potassium: 2.3 mmol/L — CL (ref 3.5–5.1)
Sodium: 143 mmol/L (ref 135–145)
Total Bilirubin: <0.2 mg/dL (ref 0.0–1.2)
Total Protein: 3.8 g/dL — ABNORMAL LOW (ref 6.5–8.1)

## 2024-10-27 LAB — GLUCOSE, CAPILLARY: Glucose-Capillary: 89 mg/dL (ref 70–99)

## 2024-10-27 LAB — URINALYSIS, ROUTINE W REFLEX MICROSCOPIC
Bilirubin Urine: NEGATIVE
Glucose, UA: NEGATIVE mg/dL
Ketones, ur: NEGATIVE mg/dL
Nitrite: NEGATIVE
Protein, ur: 100 mg/dL — AB
Specific Gravity, Urine: 1.025 (ref 1.005–1.030)
WBC, UA: >50 WBC/hpf (ref 0–5)
pH: 8 (ref 5.0–8.0)

## 2024-10-27 LAB — MAGNESIUM
Magnesium: 0.9 mg/dL — CL (ref 1.7–2.4)
Magnesium: 3.3 mg/dL — ABNORMAL HIGH (ref 1.7–2.4)

## 2024-10-27 LAB — BASIC METABOLIC PANEL WITH GFR
Anion gap: 11 (ref 5–15)
BUN: 14 mg/dL (ref 8–23)
CO2: 23 mmol/L (ref 22–32)
Calcium: 9.5 mg/dL (ref 8.9–10.3)
Chloride: 97 mmol/L — ABNORMAL LOW (ref 98–111)
Creatinine, Ser: 1.33 mg/dL — ABNORMAL HIGH (ref 0.44–1.00)
GFR, Estimated: 39 mL/min — ABNORMAL LOW (ref >60)
Glucose, Bld: 126 mg/dL — ABNORMAL HIGH (ref 70–99)
Potassium: 5.5 mmol/L — ABNORMAL HIGH (ref 3.5–5.1)
Sodium: 131 mmol/L — ABNORMAL LOW (ref 135–145)

## 2024-10-27 LAB — LIPASE, BLOOD: Lipase: 11 U/L (ref 11–51)

## 2024-10-27 MED ORDER — POTASSIUM CHLORIDE CRYS ER 20 MEQ PO TBCR
20.0000 meq | EXTENDED_RELEASE_TABLET | Freq: Three times a day (TID) | ORAL | Status: DC
  Administered 2024-10-27: 20 meq via ORAL
  Filled 2024-10-27: qty 1

## 2024-10-27 MED ORDER — METOPROLOL TARTRATE 5 MG/5ML IV SOLN
5.0000 mg | Freq: Once | INTRAVENOUS | Status: AC
  Administered 2024-10-27: 5 mg via INTRAVENOUS
  Filled 2024-10-27: qty 5

## 2024-10-27 MED ORDER — SODIUM CHLORIDE 0.9% FLUSH: 10.0000 mL | INTRAVENOUS | Status: DC | PRN

## 2024-10-27 MED ORDER — ALPRAZOLAM 0.5 MG PO TABS: 0.5000 mg | ORAL_TABLET | Freq: Two times a day (BID) | ORAL | Status: DC | PRN

## 2024-10-27 MED ORDER — OXYCODONE HCL 5 MG PO TABS: 5.0000 mg | ORAL_TABLET | ORAL | Status: DC | PRN

## 2024-10-27 MED ORDER — CALCIUM GLUCONATE-NACL 1-0.675 GM/50ML-% IV SOLN
1.0000 g | Freq: Once | INTRAVENOUS | Status: AC
  Administered 2024-10-27: 1000 mg via INTRAVENOUS
  Filled 2024-10-27: qty 50

## 2024-10-27 MED ORDER — ACETAMINOPHEN 500 MG PO TABS: 500.0000 mg | ORAL_TABLET | Freq: Four times a day (QID) | ORAL | Status: DC | PRN

## 2024-10-27 MED ORDER — SODIUM CHLORIDE 0.9 % IV SOLN
2.0000 g | INTRAVENOUS | Status: DC
  Administered 2024-10-28: 2 g via INTRAVENOUS
  Filled 2024-10-27: qty 20

## 2024-10-27 MED ORDER — APIXABAN 5 MG PO TABS
5.0000 mg | ORAL_TABLET | Freq: Two times a day (BID) | ORAL | Status: DC
  Administered 2024-10-27 – 2024-10-28 (×3): 5 mg via ORAL
  Filled 2024-10-27 (×3): qty 1

## 2024-10-27 MED ORDER — POTASSIUM CHLORIDE 10 MEQ/100ML IV SOLN
10.0000 meq | INTRAVENOUS | Status: AC
  Administered 2024-10-27 (×6): 10 meq via INTRAVENOUS
  Filled 2024-10-27 (×6): qty 100

## 2024-10-27 MED ORDER — MAGNESIUM SULFATE 4 GM/100ML IV SOLN
4.0000 g | Freq: Once | INTRAVENOUS | Status: AC
  Administered 2024-10-27: 4 g via INTRAVENOUS
  Filled 2024-10-27: qty 100

## 2024-10-27 MED ORDER — CALCIUM GLUCONATE-NACL 2-0.675 GM/100ML-% IV SOLN
2.0000 g | Freq: Once | INTRAVENOUS | Status: AC
  Administered 2024-10-27: 2000 mg via INTRAVENOUS
  Filled 2024-10-27: qty 100

## 2024-10-27 MED ORDER — SODIUM CHLORIDE 0.9 % IV SOLN
1.0000 g | Freq: Once | INTRAVENOUS | Status: AC
  Administered 2024-10-27: 1 g via INTRAVENOUS
  Filled 2024-10-27: qty 10

## 2024-10-27 MED ORDER — MAGNESIUM SULFATE IN D5W 1-5 GM/100ML-% IV SOLN
1.0000 g | Freq: Once | INTRAVENOUS | Status: AC
  Administered 2024-10-27: 1 g via INTRAVENOUS
  Filled 2024-10-27: qty 100

## 2024-10-27 MED ORDER — MAGNESIUM SULFATE 50 % IJ SOLN: 1.0000 g | Freq: Once | INTRAMUSCULAR | Status: DC

## 2024-10-27 MED ORDER — PANTOPRAZOLE SODIUM 40 MG PO TBEC
40.0000 mg | DELAYED_RELEASE_TABLET | Freq: Every day | ORAL | Status: DC
  Administered 2024-10-27 – 2024-10-28 (×2): 40 mg via ORAL
  Filled 2024-10-27 (×2): qty 1

## 2024-10-27 MED ORDER — MELATONIN 5 MG PO TABS: 5.0000 mg | ORAL_TABLET | Freq: Every evening | ORAL | Status: DC | PRN

## 2024-10-27 MED ORDER — SODIUM CHLORIDE 0.9% FLUSH
10.0000 mL | Freq: Two times a day (BID) | INTRAVENOUS | Status: DC
  Administered 2024-10-27: 40 mL

## 2024-10-27 MED ORDER — LEVOTHYROXINE SODIUM 125 MCG PO TABS
125.0000 ug | ORAL_TABLET | Freq: Every day | ORAL | Status: DC
  Administered 2024-10-27 – 2024-10-28 (×2): 125 ug via ORAL
  Filled 2024-10-27 (×2): qty 1

## 2024-10-27 MED ORDER — POTASSIUM CHLORIDE 2 MEQ/ML IV SOLN
INTRAVENOUS | Status: DC
  Filled 2024-10-27: qty 1000

## 2024-10-27 MED ORDER — CHLORHEXIDINE GLUCONATE CLOTH 2 % EX PADS
6.0000 | MEDICATED_PAD | Freq: Every day | CUTANEOUS | Status: DC
  Administered 2024-10-27: 6 via TOPICAL

## 2024-10-27 MED ORDER — SODIUM CHLORIDE 0.9 % IV SOLN: INTRAVENOUS | Status: DC

## 2024-10-27 MED ORDER — SODIUM CHLORIDE 0.9% FLUSH
10.0000 mL | Freq: Two times a day (BID) | INTRAVENOUS | Status: DC
  Administered 2024-10-27: 10 mL
  Administered 2024-10-27: 40 mL

## 2024-10-27 MED ORDER — POTASSIUM CHLORIDE CRYS ER 20 MEQ PO TBCR
40.0000 meq | EXTENDED_RELEASE_TABLET | Freq: Once | ORAL | Status: AC
  Administered 2024-10-27: 40 meq via ORAL
  Filled 2024-10-27: qty 2

## 2024-10-27 MED ORDER — PROCHLORPERAZINE EDISYLATE 10 MG/2ML IJ SOLN: 5.0000 mg | Freq: Four times a day (QID) | INTRAMUSCULAR | Status: DC | PRN

## 2024-10-27 MED ORDER — POLYETHYLENE GLYCOL 3350 17 G PO PACK
17.0000 g | PACK | Freq: Every day | ORAL | Status: DC | PRN
Start: 2024-10-27 — End: 2024-10-28

## 2024-10-27 MED ORDER — IOHEXOL 300 MG/ML  SOLN
100.0000 mL | Freq: Once | INTRAMUSCULAR | Status: AC | PRN
  Administered 2024-10-27: 100 mL via INTRAVENOUS

## 2024-10-27 MED ORDER — METOPROLOL TARTRATE 25 MG PO TABS
25.0000 mg | ORAL_TABLET | Freq: Once | ORAL | Status: AC
  Administered 2024-10-27: 25 mg via ORAL
  Filled 2024-10-27: qty 1

## 2024-10-27 MED ORDER — DEXTROSE 50 % IV SOLN
50.0000 mL | INTRAVENOUS | Status: DC | PRN
Start: 2024-10-27 — End: 2024-10-28

## 2024-10-27 MED ORDER — ALPRAZOLAM 0.25 MG PO TABS
0.2500 mg | ORAL_TABLET | Freq: Two times a day (BID) | ORAL | Status: DC | PRN
  Administered 2024-10-27 – 2024-10-28 (×2): 0.25 mg via ORAL
  Filled 2024-10-27 (×2): qty 1

## 2024-10-27 MED ORDER — METOPROLOL TARTRATE 25 MG PO TABS
12.5000 mg | ORAL_TABLET | Freq: Two times a day (BID) | ORAL | Status: DC
  Administered 2024-10-27 – 2024-10-28 (×3): 12.5 mg via ORAL
  Filled 2024-10-27 (×3): qty 1

## 2024-10-27 MED ORDER — HYDROMORPHONE HCL 1 MG/ML IJ SOLN
0.5000 mg | INTRAMUSCULAR | Status: DC | PRN
  Administered 2024-10-27: 0.5 mg via INTRAVENOUS
  Filled 2024-10-27: qty 0.5

## 2024-10-27 MED ORDER — DICYCLOMINE HCL 10 MG PO CAPS
10.0000 mg | ORAL_CAPSULE | Freq: Once | ORAL | Status: AC
  Administered 2024-10-27: 10 mg via ORAL
  Filled 2024-10-27: qty 1

## 2024-10-27 MED ORDER — SODIUM CHLORIDE 0.9% FLUSH
10.0000 mL | INTRAVENOUS | Status: DC | PRN
  Administered 2024-10-28: 10 mL

## 2024-10-27 MED ORDER — ENOXAPARIN SODIUM 40 MG/0.4ML IJ SOSY: 40.0000 mg | PREFILLED_SYRINGE | INTRAMUSCULAR | Status: DC

## 2024-10-27 NOTE — ED Notes (Signed)
 Turned off oxygen, going to see if patients oxygen maintains in stable range

## 2024-10-27 NOTE — H&P (Addendum)
 History and Physical  [name] FMW:[id number] DOB: 08/22/1936 DOA: 10/26/2024  Referring physician: Logan Martinis, EDP  PCP: [name], MD  Outpatient Specialists: Medical oncology. Patient coming from: Home, lives by self.  Chief Complaint: Abdominal pain.  HPI: [name] is a 88 y.o. female with medical history significant for vaginal cancer, last chemotherapy was on 08/28/2024, urinary retention and incontinence, hypertension, anemia of chronic disease, persistent atrial fibrillation on Eliquis , who presents to the ER from home due to abdominal pain associated with significant generalized weakness.  At baseline, she is independent, lives alone, active, and do her own chores.  For the past week, she has felt so weak to the point where she could barely get out of bed.  Endorses lower abdominal pain, nausea, dry heaves, poor appetite, and frequent urination.  Also endorses loose stools off-and-on for the past 3 days.  In the ER, tachycardic and tachypneic.  UA positive for pyuria.  Lab studies notable for multiple electrolytes imbalance, including hypokalemia 2.3, hypocalcemia 5.4, hypomagnesemia 0.8, and metabolic acidosis with serum bicarb of 18.  The patient had an episode of SVT in the ER and received IV Lopressor  with improvement.  She also received IV opiate based analgesics, IV fluid NS 1 L bolus x 1, potassium, magnesium  and calcium replacements.  EDP requesting admission for further management.  Admitted by [facility], hospitalist service for electrolyte disturbances and UTI.  Weak appearing, hypovolemic, abdominal pain is improved after IV opiate analgesics..  ED Course: Temperature 98.4.  BP 148/73, pulse 71, respiratory 16, saturation 92% on room air.  Review of Systems: Review of systems as noted in the HPI. All other systems reviewed and are negative.   Past Medical History:  Diagnosis Date   Arthritis    CHF (congestive heart failure) (HCC)    Chronic  Leukemia    Depression    Dysrhythmia    A.fib   on Eliquis    H/O hiatal hernia    Hernia    Hernia    right inguinal hernia   History of radiation therapy    Vagina- 08/21/24-08/25/24- Dr. Lynwood Nasuti   Hypertension    Hypothyroidism    Leg swelling    Osteitis pubis 05/30/2001   Osteopenia 03/30/2009   Osteopenia 07/30/2022   Bone density 2021 showed elevated FRAX patient was started on Fosamax    Pneumonia    Thyroid  disease    Past Surgical History:  Procedure Laterality Date   ABDOMINAL HYSTERECTOMY  03/1970   APPENDECTOMY  03/1966   BILATERAL SALPINGOOPHORECTOMY     CARDIOVERSION N/A 03/10/2022   Procedure: CARDIOVERSION;  Surgeon: Francyne Headland, MD;  Location: [facility] ENDOSCOPY;  Service: Cardiovascular;  Laterality: N/A;   CATARACT EXTRACTION W/PHACO Right 09/25/2014   Procedure: CATARACT EXTRACTION PHACO AND INTRAOCULAR LENS PLACEMENT (IOC);  Surgeon: Oneil T. Roz, MD;  Location: [facility] ORS;  Service: Ophthalmology;  Laterality: Right;  CDE:7.56   CATARACT EXTRACTION W/PHACO Left 10/09/2014   Procedure: CATARACT EXTRACTION PHACO AND INTRAOCULAR LENS PLACEMENT LEFT EYE CDE=6.51;  Surgeon: Oneil T. Roz, MD;  Location: [facility] ORS;  Service: Ophthalmology;  Laterality: Left;   CHOLECYSTECTOMY  1980   COLONOSCOPY  1999   COLONOSCOPY  March 2007   Repeat 10 years   COLONOSCOPY  11/06/2003    Internal and external hemorrhoids/Two tiny rectal distal sigmoid polyps cold biopsied/Otherwise within normal limits to the cecum   COLONOSCOPY N/A 08/29/2014   Dr. Shaaron: Sessile polyp was found at the cecum status post cold  snare polypectomy, tubular adenoma. No further surveillance unless clinical status changes   COLONOSCOPY N/A 09/19/2024   Procedure: COLONOSCOPY;  Surgeon: Eartha Angelia Sieving, MD;  Location: [facility] END[facility] SUITE;  Service: Gastroenterology;  Laterality: N/A;   CYSTOSCOPY N/A 05/24/2024   Procedure: CYSTOSCOPY;  Surgeon: Viktoria Comer SAUNDERS, MD;  Location: [facility] ORS;  Service:  Gynecology;  Laterality: N/A;   ESOPHAGOGASTRODUODENOSCOPY  2007   Dr. Shaaron: normal esophagus, small tom oderate sized hiatal hernia, Cameron lesions   ESOPHAGOGASTRODUODENOSCOPY N/A 09/19/2024   Procedure: EGD (ESOPHAGOGASTRODUODENOSCOPY);  Surgeon: Eartha Angelia, Sieving, MD;  Location: [facility];  Service: Gastroenterology;  Laterality: N/A;   EXAM UNDER ANESTHESIA, PELVIC N/A 05/24/2024   Procedure: EXAM UNDER ANESTHESIA, PELVIC;  Surgeon: Viktoria Comer SAUNDERS, MD;  Location: [facility] ORS;  Service: Gynecology;  Laterality: N/A;   IR IMAGING GUIDED PORT INSERTION  05/23/2024   SKIN CANCER EXCISION Left 11 1 2016   left arm squamous cancer   TUMOR REMOVAL  03/1968   ovarian    Social History:  reports that she has never smoked. She has never been exposed to tobacco smoke. She has never used smokeless tobacco. She reports current alcohol use. She reports that she does not use drugs.   Allergies  Allergen Reactions   Erythromycin Shortness Of Breath   Aspirin Other (See Comments)    Caused internal bleeding. Patient had ulcer at the time. Instructed by PCP - Nancey not to take any longer.   Ibuprofen Other (See Comments)    Instructed not to take by PCP - Nancey due to reaction to aspirin as well (precautionary).    Family History  Problem Relation Age of Onset   Dementia Mother    Heart disease Father    Cancer Father        throat   Cancer Sister        breast   Breast cancer Sister    Lung cancer Grandson    Colon cancer Neg Hx    Lung disease Neg Hx    Autoimmune disease Neg Hx       Prior to Admission medications   Medication Sig Start Date End Date Taking? Authorizing Provider  acetaminophen  (TYLENOL ) 650 MG CR tablet Take 650 mg by mouth every 8 (eight) hours as needed for pain.   Yes [provider]  albuterol  (VENTOLIN  HFA) 108 (90 Base) MCG/ACT inhaler INHALE 2 PUFFS INTO THE LUNGS EVERY 6 HOURS AS NEEDED FOR WHEEZING OR SHORTNESS OF BREATH 05/29/24   Yes Luking, Scott A, MD  ALPRAZolam  (XANAX ) 0.5 MG tablet TAKE 1 TABLET BY MOUTH EVERY NIGHT AT BEDTIME. MAY TAKE 1/2 TABLET DURING THE DAY AS NEEDED Patient taking differently: Take 0.5 mg by mouth 2 (two) times daily as needed for anxiety. TAKE 1 TABLET BY MOUTH EVERY NIGHT AT BEDTIME. MAY TAKE 1/2 TABLET DURING THE DAY AS NEEDED 09/14/24  Yes Luking, Glendia LABOR, MD  cephALEXin  (KEFLEX ) 500 MG capsule Take 1 capsule (500 mg total) by mouth 4 (four) times daily. 10/13/24  Yes Francesca Elsie CROME, MD  Cholecalciferol (VITAMIN D3) 2000 UNITS TABS Take 2,000 Units by mouth daily.   Yes [provider]  ELIQUIS  5 MG TABS tablet TAKE 1 TABLET(5 MG) BY MOUTH TWICE DAILY. FOLLOW-UP 10/06/24  Yes Fountain, Madison L, NP  indapamide  (LOZOL ) 1.25 MG tablet Take 1.25 mg by mouth daily. 10/18/24  Yes [provider]  levothyroxine  (SYNTHROID ) 125 MCG tablet TAKE 1 TABLET BY MOUTH DAILY 08/25/24  Yes  [name], MD  magnesium  gluconate (MAGONATE) 500 (27 Mg) MG TABS tablet Take 1 tablet (500 mg total) by mouth daily at 6 (six) AM. Patient taking differently: Take 500 mg by mouth daily at 6 (six) AM. As needed 08/28/24  Yes [name], MD  metoprolol  succinate (TOPROL -XL) 25 MG 24 hr tablet Take 1 tablet (25 mg total) by mouth daily. 07/13/24  Yes Mauro Elveria BROCKS, NP  Multiple Vitamin (MULTI-VITAMIN DAILY PO) Take 1 tablet by mouth daily.   Yes [provider]  nitrofurantoin , macrocrystal-monohydrate, (MACROBID ) 100 MG capsule Take 1 capsule (100 mg total) by mouth 2 (two) times daily. 09/15/24  Yes McKenzie, Belvie CROME, MD  ondansetron  (ZOFRAN -ODT) 4 MG disintegrating tablet Take 1 tablet (4 mg total) by mouth every 8 (eight) hours as needed. 10/13/24  Yes Francesca Elsie CROME, MD  pantoprazole  (PROTONIX ) 40 MG tablet TAKE 1 TABLET(40 MG) BY MOUTH DAILY 07/24/24  Yes Bluford, Jayce G, DO  potassium chloride  SA (KLOR-CON  M) 20 MEQ tablet 1 bid 09/27/24  Yes Luking, Glendia LABOR, MD   pregabalin  (LYRICA ) 25 MG capsule Take 25 mg by mouth 2 (two) times daily. 08/24/24  Yes [provider]  sertraline  (ZOLOFT ) 100 MG tablet TAKE 1 TABLET BY MOUTH EVERY DAY 02/21/24  Yes Luking, Glendia LABOR, MD  torsemide  (DEMADEX ) 20 MG tablet Take 1 tablet (20 mg total) by mouth daily. 09/27/24  Yes Luking, Glendia LABOR, MD  alendronate  (FOSAMAX ) 70 MG tablet TAKE 1 TABLET(70 MG) BY MOUTH EVERY 7 DAYS WITH A FULL GLASS OF WATER  AND ON AN EMPTY STOMACH Patient not taking: Reported on 10/27/2024 08/16/23   [name], MD  polyethylene glycol (MIRALAX ) 17 g packet Take 17 g by mouth daily. 09/20/24   Darci Pore, MD    Physical Exam: BP (!) 154/66   Pulse 67   Temp 98.4 F (36.9 C) (Oral)   Resp 17   Ht 5' 5.5 (1.664 m)   Wt 80.2 kg   SpO2 95%   BMI 28.97 kg/m   General: 88 y.o. year-old female well developed well nourished in no acute distress.  Alert and oriented x3. Cardiovascular: Regular rate and rhythm with no rubs or gallops.  No thyromegaly or JVD noted.  No lower extremity edema. 2/4 pulses in all 4 extremities. Respiratory: Clear to auscultation with no wheezes or rales. Good inspiratory effort. Abdomen: Soft lower abdominal quadrant tenderness.  Nondistended with normal bowel sounds x4 quadrants. Muskuloskeletal: No cyanosis, clubbing or edema noted bilaterally Neuro: CN II-XII intact, strength, sensation, reflexes Skin: No ulcerative lesions noted or rashes Psychiatry: Judgement and insight appear normal. Mood is appropriate for condition and setting          Labs on Admission:  Basic Metabolic Panel: Recent Labs  Lab 10/26/24 2237  NA 143  K 2.3*  CL 117*  CO2 18*  GLUCOSE 64*  BUN 11  CREATININE 0.62  CALCIUM 5.4*  MG 0.9*   Liver Function Tests: Recent Labs  Lab 10/26/24 2237  AST 13*  ALT <5  ALKPHOS 54  BILITOT <0.2  PROT 3.8*  ALBUMIN 1.8*   Recent Labs  Lab 10/26/24 2237  LIPASE 11   No results for input(s): AMMONIA in  the last 168 hours. CBC: Recent Labs  Lab 10/26/24 2237  WBC 13.2*  NEUTROABS 10.4*  HGB 8.5*  HCT 26.3*  MCV 103.1*  PLT 425*   Cardiac Enzymes: No results for input(s): CKTOTAL, CKMB, CKMBINDEX, TROPONINI in the  last 168 hours.  BNP (last 3 results) Recent Labs    10/02/24 0934  BNP 120.9*    ProBNP (last 3 results) No results for input(s): PROBNP in the last 8760 hours.  CBG: No results for input(s): GLUCAP in the last 168 hours.  Radiological Exams on Admission: CT ABDOMEN PELVIS W CONTRAST Result Date: 10/27/2024 EXAM: CT ABDOMEN AND PELVIS WITH CONTRAST 10/27/2024 01:11:49 AM TECHNIQUE: CT of the abdomen and pelvis was performed with the administration of 100 mL of iohexol  (OMNIPAQUE ) 300 MG/ML solution. Multiplanar reformatted images are provided for review. Automated exposure control, iterative reconstruction, and/or weight-based adjustment of the mA/kV was utilized to reduce the radiation dose to as low as reasonably achievable. COMPARISON: 10/13/2024 CLINICAL HISTORY: Abdominal pain, acute, nonlocalized. FINDINGS: LOWER CHEST: No focal infiltrate is seen in the lung bases. Hiatal hernia is again seen and stable. LIVER: Liver is within normal limits. GALLBLADDER AND BILE DUCTS: Gallbladder has been surgically removed. No biliary ductal dilatation. SPLEEN: The spleen is within normal limits. PANCREAS: The pancreas is within normal limits. ADRENAL GLANDS: The adrenal glands are unremarkable. KIDNEYS, URETERS AND BLADDER: Kidneys demonstrate some slight decreased enhancement on the left on the initial images as well as on delayed images. Bilateral hydronephrosis and hydroureter are seen. This is slightly more prominent on the left than the right. This is felt to be related to local compression by the known enhancing mass lesion at the superior aspect of the vaginal cuff. These changes are similar to that noted on the prior exam. No cystic change is again identified  and stable. The bladder is decompressed. GI AND BOWEL: Stomach again shows a moderate hiatal hernia. No obstructive or inflammatory changes of the colon are seen. The appendix is not well visualized and may have been surgically removed. No inflammatory changes to suggest appendicitis are seen. Small bowel is within normal limits. PERITONEUM AND RETROPERITONEUM: No ascites. No free air. VASCULATURE: Aortic calcifications are seen. LYMPH NODES: No significant lymphadenopathy is noted in the retroperitoneum. Left iliac adenopathy is noted, measuring up to 15 mm, best seen on image 69 of series 2. Prominent lymphadenopathy is noted. REPRODUCTIVE ORGANS: Known enhancing mass lesion at the superior aspect of the vaginal cuff, stable in appearance. . This again represents vaginal carcinoma. BONES AND SOFT TISSUES: No acute osseous abnormality is seen. Degenerative change of the lumbar spine is noted. Fat-containing right inguinal hernia is noted. IMPRESSION: 1. Bilateral hydronephrosis and hydroureter, slightly more prominent on the left, likely related to local compression by the known enhancing mass at the superior aspect of the vaginal cuff; findings are similar to the prior exam. 2. Left iliac adenopathy measuring up to 15 mm. Electronically signed by: Oneil Devonshire MD 10/27/2024 01:32 AM EST RP Workstation: [id number]    EKG: I independently viewed the EKG done and my findings are as followed: Supraventricular tachycardia rate of 182.  QTc 498.  Assessment/Plan Present on Admission:  Electrolyte disturbance  Principal Problem:   Electrolyte disturbance  Electrolyte disturbances, POA secondary to poor oral intake, dry heaving, loose stools. Replete electrolytes as indicated. Repeat BMP, mag, and Phos this afternoon Encourage oral intake. Lab studies notable for multiple electrolytes imbalance, including hypokalemia 2.3, hypocalcemia 5.4, hypomagnesemia 0.8, and metabolic acidosis with serum bicarb of  18. Repleted intravenously.  Abdominal pain, suspect multifactorial secondary to UTI versus malignancy versus others CT abdomen and pelvis revealed bilateral hydronephrosis and hydroureter slightly more prominent on the left likely related to local compression but did not enhancing  mass at the superior aspect of the vaginal cuff findings are similar to the prior exam. Left iliac adenopathy measuring up to 15 mm. Continue pain control  Presumptive UTI, immunocompromised History of vaginal cancer Last chemotherapy on 08/28/2024 Follow urine culture for ID and sensitivities Continue Rocephin started in the ER. Monitor fever curve and WBCs.  History of urinary retention Bladder scan, In-N-Out cath as needed  Generalized weakness PT OT evaluation Fall precautions.  Vaginal cancer Close follow-up with medical oncology outpatient  Hypothyroidism Resume home levothyroxine   GERD Resume home PPI  Paroxysmal A-fib on Eliquis  Resume home Eliquis  P.o. Lopressor  12.5 mg twice daily  Anemia of chronic disease Hemoglobin 8.5 Monitor H&H Transfuse as indicated.  Hypoglycemia due to poor oral intake Presented with serum glucose of 64 Restart diet CBG twice daily, D50 as needed   Time: 75 minutes.   DVT prophylaxis: Home Eliquis  twice daily.  Code Status: Full code.  Family Communication: None at bedside.  Disposition Plan: Admitted to telemetry unit.  Consults called: None.  Admission status: Inpatient status.   Status is: The patient requires at least 2 midnights for further evaluation and treatment of present condition.   Terry LOISE Hurst MD [facility] Pager [phone]  If 7PM-7AM, please contact night-coverage [email or web address] Password [email or web address]  10/27/2024, 6:30 AM

## 2024-10-27 NOTE — ED Notes (Signed)
 Lab adding on magnesium 

## 2024-10-27 NOTE — TOC Initial Note (Signed)
 Transition of Care [facility]) - Initial/Assessment Note    Patient Details  Name: [name] MRN: [id number] Date of Birth: [birth year]  Transition of Care [facility]) CM/SW Contact:    Bascom Service, RN Phone Number: 10/27/2024, 2:54 PM  Clinical Narrative: d/c plan home. Has own transport home.                  Expected Discharge Plan: Home/Self Care Barriers to Discharge: Continued Medical Work up   Patient Goals and CMS Choice Patient states their goals for this hospitalization and ongoing recovery are:: Home CMS Medicare.gov Compare Post Acute Care list provided to:: Patient Choice offered to / list presented to : Patient [place] ownership interest in [facility]provided to:: Patient    Expected Discharge Plan and Services                                              Prior Living Arrangements/Services                       Activities of Daily Living   ADL Screening (condition at time of admission) Independently performs ADLs?: Yes (appropriate for developmental age) Is the patient deaf or have difficulty hearing?: Yes Does the patient have difficulty seeing, even when wearing glasses/contacts?: No Does the patient have difficulty concentrating, remembering, or making decisions?: No  Permission Sought/Granted                  Emotional Assessment              Admission diagnosis:  Hypocalcemia [E83.51] Hypokalemia [E87.6] Hypomagnesemia [E83.42] Generalized abdominal pain [R10.84] Electrolyte disturbance [E87.8] Patient Active Problem List   Diagnosis Date Noted   Electrolyte disturbance 10/27/2024   Low serum albumin 10/12/2024   ABLA (acute blood loss anemia) 09/28/2024   Vaginal bleeding 09/18/2024   Macrocytic anemia 09/18/2024   Hypocalcemia 09/18/2024   Obesity, Class I, BMI 30-34.9 09/18/2024   Insomnia 09/18/2024   GI bleed 09/17/2024   Hypokalemia 08/31/2024   Dysuria 08/31/2024   DNR (do not  resuscitate) 07/23/2024   Pancytopenia, acquired (HCC) 06/16/2024   Anemia due to antineoplastic chemotherapy 05/26/2024   Constipation 05/24/2024   Hypomagnesemia 05/16/2024   Goals of care, counseling/discussion 05/12/2024   Vaginal cancer (HCC) 04/28/2024   Idiopathic peripheral neuropathy 04/28/2024   Hearing deficit, bilateral 04/28/2024   Postmenopausal vaginal bleeding 03/29/2024   Urinary incontinence 03/29/2024   Acute cystitis with hematuria 03/08/2024   Chronic diastolic (congestive) heart failure (HCC) 01/28/2023   Lymphoproliferative disease (HCC) 01/28/2023   Idiopathic pulmonary fibrosis (HCC) 08/18/2022   Osteopenia 07/30/2022   Persistent atrial fibrillation (HCC)    Monoclonal B-cell lymphocytosis 01/21/2022   Bilateral leg edema 05/24/2015   GERD (gastroesophageal reflux disease) 07/02/2014   Hyperlipidemia 05/16/2013   Acquired hypothyroidism 05/16/2013   Essential hypertension, benign 05/16/2013   PCP:  [name], MD Pharmacy:   [facility] [phone] - [place], [place] - [address] AT SEC [address] & [address] [place] [address] Phone: [phone] Fax: [phone]     Social Drivers of Health (SDOH) Social History: SDOH Screenings   Food Insecurity: No Food Insecurity (10/27/2024)  Housing: Low Risk  (10/27/2024)  Transportation Needs: No Transportation Needs (10/27/2024)  Utilities: Not At Risk (10/27/2024)  Alcohol Screen: Low Risk  (07/07/2024)  Depression (PHQ2-9): Medium Risk (10/03/2024)  Financial Resource Strain: Low Risk  (07/07/2024)  Physical Activity: Inactive (07/07/2024)  Social Connections: Unknown (10/27/2024)  Stress: No Stress Concern Present (07/07/2024)  Tobacco Use: Low Risk  (10/26/2024)  Health Literacy: Adequate Health Literacy (07/07/2024)   SDOH Interventions:     Readmission Risk Interventions     No data to display

## 2024-10-27 NOTE — ED Notes (Addendum)
 Family went home, if needing anything can contact Brooke (granddaughter), contact information in the chart.

## 2024-10-27 NOTE — ED Provider Notes (Signed)
 Physical Exam  BP (!) 143/75   Pulse 79   Temp 98.2 F (36.8 C) (Oral)   Resp 17   Ht 5' 5.5 (1.664 m)   Wt 80.2 kg   SpO2 100%   BMI 28.97 kg/m   Physical Exam  Procedures  .Critical Care  Performed by: Logan Ubaldo NOVAK, PA-C Authorized by: Logan Ubaldo NOVAK, PA-C   Critical care provider statement:    Critical care time (minutes):  30   Critical care time was exclusive of:  Separately billable procedures and treating other patients   Critical care was necessary to treat or prevent imminent or life-threatening deterioration of the following conditions:  Cardiac failure and metabolic crisis   Critical care was time spent personally by me on the following activities:  Development of treatment plan with patient or surrogate, discussions with consultants, evaluation of patient's response to treatment, examination of patient, ordering and review of laboratory studies, ordering and review of radiographic studies, ordering and performing treatments and interventions, pulse oximetry, re-evaluation of patient's condition and review of old charts   Care discussed with: admitting provider     ED Course / MDM    Medical Decision Making Amount and/or Complexity of Data Reviewed Labs: ordered. Radiology: ordered.  Risk Prescription drug management.   Patient care assumed at shift handoff.  Please see previous providers note for full details.  In short, patient is an 88 year old female with history of vaginal cancer who presents emergency room complaining of right lower quadrant abdominal pain.  She endorses pain which began in September after finishing chemo/radiation but states that over the past week her pain has worsened.  Patient also reported nausea without vomiting.    At the time of my assumption of care lab work was pending as was a CT abdomen pelvis.   Labs show significant hypokalemia with a potassium of 2.3 and hypocalcemia with a calcium of 5.4.  And magnesium  levels  ordered which resulted at 0.9.  Supplemental potassium, calcium, and magnesium  were ordered.   I went to reassess the patient after the CT resulted which showed no new acute findings and noted that the patient was significantly tachycardic with a heart rate in the 180s.  I ordered an EKG which revealed SVT as the rhythm.  Patient takes both Eliquis  and metoprolol  at home and family reports that she frequently misses doses including missing her metoprolol  earlier today.  A dose of metoprolol  was ordered and the patient was placed on the cardiac monitor  Patient was reevaluated after receiving potassium, magnesium , calcium, and metoprolol .  Heart rate has now normalized into the 70s.  Pain has improved significantly. She was administered Bentyl for stomach cramping.  The patient's urinalysis resulted showing moderate hemoglobin, moderate leukocytes, many bacteria with greater than 50 WBCs.  There were 6-10 non-squamous epithelial cells so I do question if there could have been some contamination/dirty sample.  I discussed urinary symptoms with the patient and she does tell me that for the past 6 weeks or so she has had issues with urinary frequency with borderline incontinence.  She has had to wear depends which is new for the patient and the patient became tearful discussing this.  Rocephin administered for presumptive UTI. Urine culture added.  Chart review shows oncology note showing concerns for possible radiation cystitis. Patient will need hospital admission for observation, continued potassium repletion, and repeat labs  Dr.Hall agrees to see the patient for admission.        Logan,  Ubaldo NOVAK, PA-C 10/27/24 0456    Emil Share, DO 10/27/24 [phone]

## 2024-10-27 NOTE — ED Notes (Signed)
 Patient to CT.

## 2024-10-27 NOTE — Evaluation (Signed)
 Physical Therapy Evaluation Patient Details Name: [name] MRN: [id number] DOB: 1936/04/21 Today's Date: 10/27/2024  History of Present Illness  Pt is 88 yo female who presented on 10/26/24 with abdominal pain and weakness.  Pt admitted with electrolyte disturbances.  Pt with hx including but not limited to vaginal cancer, last chemotherapy was on 08/28/2024, urinary retention and incontinence, hypertension, anemia of chronic disease, persistent atrial fibrillation on Eliquis   Clinical Impression  Pt admitted with above diagnosis. At baseline, pt is independent and ambulatory with cane. She lives alone but family nearby and assist as needed.  Today , pt ambulating 400' safely with her cane.  She was steady with no loss of balance.  Pt appears near baseline.  No further PT needs.          If plan is discharge home, recommend the following:     Can travel by private vehicle        Equipment Recommendations None recommended by PT  Recommendations for Other Services       Functional Status Assessment Patient has not had a recent decline in their functional status     Precautions / Restrictions Precautions Precautions: Fall      Mobility  Bed Mobility Overal bed mobility: Needs Assistance Bed Mobility: Supine to Sit, Sit to Supine     Supine to sit: Supervision Sit to supine: Supervision        Transfers Overall transfer level: Needs assistance Equipment used: Straight cane Transfers: Sit to/from Stand Sit to Stand: Supervision                Ambulation/Gait Ambulation/Gait assistance: Supervision Gait Distance (Feet): 400 Feet Assistive device: Straight cane Gait Pattern/deviations: Step-through pattern Gait velocity: functional     General Gait Details: Using personal cane, steady gait, no LOB, feels at baseline  Stairs            Wheelchair Mobility     Tilt Bed    Modified Rankin (Stroke Patients Only)       Balance Overall  balance assessment: Needs assistance Sitting-balance support: No upper extremity supported Sitting balance-Leahy Scale: Normal     Standing balance support: No upper extremity supported Standing balance-Leahy Scale: Good    Standing to don brief without difficulty and no UE support                           Pertinent Vitals/Pain Pain Assessment Pain Assessment: No/denies pain    Home Living Family/patient expects to be discharged to:: Private residence Living Arrangements: Alone Available Help at Discharge: Family;Available PRN/intermittently (son just up road) Type of Home: House Home Access: Stairs to enter   [employer] of Steps: 1 (banisters on each side)   Home Layout: One level Home Equipment: Grab bars - tub/shower;Rolling Walker (2 wheels);Standard Walker;Cane - single point      Prior Function Prior Level of Function : Independent/Modified Independent;Driving             Mobility Comments: Uses cane (single point but broad base) to ambulate; could ambulate in community ADLs Comments: independent adls and iadls, does her own shopping, if needs help family nearby     Extremity/Trunk Assessment   Upper Extremity Assessment Upper Extremity Assessment: Overall WFL for tasks assessed    Lower Extremity Assessment Lower Extremity Assessment: Overall WFL for tasks assessed    Cervical / Trunk Assessment Cervical / Trunk Assessment: Normal  Communication   Communication Factors  Affecting Communication: Hearing impaired    Cognition Arousal: Alert Behavior During Therapy: WFL for tasks assessed/performed   PT - Cognitive impairments: No apparent impairments                         Following commands: Intact       Cueing       General Comments      Exercises     Assessment/Plan    PT Assessment Patient does not need any further PT services  PT Problem List         PT Treatment Interventions      PT Goals  (Current goals can be found in the Care Plan section)  Acute Rehab PT Goals Patient Stated Goal: return home PT Goal Formulation: All assessment and education complete, DC therapy    Frequency       Co-evaluation               AM-PAC PT 6 Clicks Mobility  Outcome Measure Help needed turning from your back to your side while in a flat bed without using bedrails?: None Help needed moving from lying on your back to sitting on the side of a flat bed without using bedrails?: None Help needed moving to and from a bed to a chair (including a wheelchair)?: None Help needed standing up from a chair using your arms (e.g., wheelchair or bedside chair)?: None Help needed to walk in hospital room?: A Little Help needed climbing 3-5 steps with a railing? : A Little 6 Click Score: 22    End of Session Equipment Utilized During Treatment: Gait belt Activity Tolerance: Patient tolerated treatment well Patient left: in bed;with call bell/phone within reach Nurse Communication: Mobility status PT Visit Diagnosis: Other abnormalities of gait and mobility (R26.89)    Time: 8566-8540 PT Time Calculation (min) (ACUTE ONLY): 26 min   Charges:   PT Evaluation $PT Eval Low Complexity: 1 Low PT Treatments $Gait Training: 8-22 mins PT General Charges $$ ACUTE PT VISIT: 1 Visit         Benjiman, PT Acute Rehab Services [facility] [phone]   Benjiman VEAR [place] 10/27/2024, 4:14 PM

## 2024-10-27 NOTE — Plan of Care (Signed)

## 2024-10-28 DIAGNOSIS — C52 Malignant neoplasm of vagina: Secondary | ICD-10-CM

## 2024-10-28 DIAGNOSIS — N1330 Unspecified hydronephrosis: Secondary | ICD-10-CM

## 2024-10-28 DIAGNOSIS — N179 Acute kidney failure, unspecified: Secondary | ICD-10-CM

## 2024-10-28 DIAGNOSIS — E876 Hypokalemia: Principal | ICD-10-CM

## 2024-10-28 LAB — CBC
HCT: 24.6 % — ABNORMAL LOW (ref 36.0–46.0)
Hemoglobin: 7.7 g/dL — ABNORMAL LOW (ref 12.0–15.0)
MCH: 33 pg (ref 26.0–34.0)
MCHC: 31.3 g/dL (ref 30.0–36.0)
MCV: 105.6 fL — ABNORMAL HIGH (ref 80.0–100.0)
Platelets: 396 K/uL (ref 150–400)
RBC: 2.33 MIL/uL — ABNORMAL LOW (ref 3.87–5.11)
RDW: 15.1 % (ref 11.5–15.5)
WBC: 12.2 K/uL — ABNORMAL HIGH (ref 4.0–10.5)
nRBC: 0 % (ref 0.0–0.2)

## 2024-10-28 LAB — BASIC METABOLIC PANEL WITH GFR
Anion gap: 7 (ref 5–15)
BUN: 14 mg/dL (ref 8–23)
CO2: 24 mmol/L (ref 22–32)
Calcium: 8.4 mg/dL — ABNORMAL LOW (ref 8.9–10.3)
Chloride: 102 mmol/L (ref 98–111)
Creatinine, Ser: 1.36 mg/dL — ABNORMAL HIGH (ref 0.44–1.00)
GFR, Estimated: 38 mL/min — ABNORMAL LOW (ref >60)
Glucose, Bld: 103 mg/dL — ABNORMAL HIGH (ref 70–99)
Potassium: 4.5 mmol/L (ref 3.5–5.1)
Sodium: 134 mmol/L — ABNORMAL LOW (ref 135–145)

## 2024-10-28 LAB — MAGNESIUM: Magnesium: 2.5 mg/dL — ABNORMAL HIGH (ref 1.7–2.4)

## 2024-10-28 MED ORDER — HEPARIN SOD (PORK) LOCK FLUSH 100 UNIT/ML IV SOLN
500.0000 [IU] | INTRAVENOUS | Status: AC | PRN
  Administered 2024-10-28: 500 [IU]

## 2024-10-28 MED ORDER — SULFAMETHOXAZOLE-TRIMETHOPRIM 800-160 MG PO TABS: 1.0000 | ORAL_TABLET | Freq: Two times a day (BID) | ORAL | 0 refills | Status: DC

## 2024-10-28 NOTE — Discharge Summary (Signed)
 Physician Discharge Summary   Patient: [name] MRN: [id number] DOB: 03-19-1936  Admit date:     10/26/2024  Discharge date: 10/28/24  Discharge Physician: Carliss LELON Canales   PCP: [name], MD   Recommendations at discharge:    Pt to be discharged home.   If you experience worsening fever, chills, chest pain, shortness of breath, or other concerning symptoms, please call your PCP or go to the emergency department immediately.  Discharge Diagnoses: Principal Problem:   Electrolyte disturbance  Resolved Problems:   * No resolved hospital problems. [facility] Course:  88 y.o. female with medical history significant for vaginal cancer, last chemotherapy was on 08/28/2024, urinary retention and incontinence, hypertension, anemia of chronic disease, persistent atrial fibrillation on Eliquis , who presents to the ER from home due to abdominal pain associated with significant generalized weakness.  At baseline, she is independent, lives alone, active, and do her own chores.  For the past week, she has felt so weak to the point where she could barely get out of bed.  Endorses lower abdominal pain, nausea, dry heaves, poor appetite, and frequent urination.  Also endorses loose stools off-and-on for the past 3 days.   In the ER, tachycardic and tachypneic.  UA positive for pyuria.  Lab studies notable for multiple electrolytes imbalance, including hypokalemia 2.3, hypocalcemia 5.4, hypomagnesemia 0.8, and metabolic acidosis with serum bicarb of 18.   The patient had an episode of SVT in the ER and received IV Lopressor  with improvement.   She also received IV opiate based analgesics, IV fluid NS 1 L bolus x 1, potassium, magnesium  and calcium replacements.  EDP requesting admission for further management.  Admitted by [facility], hospitalist service for electrolyte disturbances and UTI.   Weak appearing, hypovolemic, abdominal pain is improved after IV opiate analgesics..  Assessment and  Plan:  Severe hypokalemia/hypocalcemia/hypomagnesemia - Likely secondary to GI loss.  Had poor p.o. intake along with some loose stools prior to presentation.  Received replenishment of potassium, magnesium , calcium.  This morning electrolyte abnormalities all resolved.  Abdominal pain, nausea, vomiting resolved.  Patient eager for discharge home.  Encourage continued p.o. intake.   Abdominal pain with suspected UTI - Abdominal pain now resolved.  No symptoms of dysuria, urgency.  UA noting LE, WBCs, bacteria.  Was placed on empiric ceftriaxone.  Will transition patient to p.o. Bactrim to take as directed upon discharge.  Acute kidney injury/history of urinary retention/BL hydronephrosis and hydroureter - CT noting bilateral hydronephrosis and hydroureter slightly more prominent on the left, likely related to local compression by the known vaginal cancer, it appears similar to previous exam.  Pt follows with Dr. Sherrilee with urology in the outpatient setting.  No abdominal pain. AKI likely related to prerenal etiology, GI loss, and poor po intake.  Bladder scan and In&out as needed while in patient.  Vaginal cancer - Follows with oncology in the outpatient setting.  Paroxysmal atrial fibrillation - Resume Eliquis , Lopressor .   Consultants: None Procedures performed: None Disposition: Home Diet recommendation:  Discharge Diet Orders (From admission, onward)     Start     Ordered   10/28/24 0000  Diet - low sodium heart healthy        10/28/24 0939           Regular diet  DISCHARGE MEDICATION: Allergies as of 10/28/2024       Reactions   Erythromycin Shortness Of Breath   Aspirin Other (See Comments)  Caused internal bleeding. Patient had ulcer at the time. Instructed by PCP - Nancey not to take any longer.   Ibuprofen Other (See Comments)   Instructed not to take by PCP - Nancey due to reaction to aspirin as well (precautionary).        Medication List     STOP  taking these medications    alendronate  70 MG tablet Commonly known as: FOSAMAX    cephALEXin  500 MG capsule Commonly known as: KEFLEX    nitrofurantoin  (macrocrystal-monohydrate) 100 MG capsule Commonly known as: MACROBID        TAKE these medications    acetaminophen  650 MG CR tablet Commonly known as: TYLENOL  Take 650 mg by mouth every 8 (eight) hours as needed for pain.   albuterol  108 (90 Base) MCG/ACT inhaler Commonly known as: VENTOLIN  HFA INHALE 2 PUFFS INTO THE LUNGS EVERY 6 HOURS AS NEEDED FOR WHEEZING OR SHORTNESS OF BREATH   ALPRAZolam  0.5 MG tablet Commonly known as: XANAX  TAKE 1 TABLET BY MOUTH EVERY NIGHT AT BEDTIME. MAY TAKE 1/2 TABLET DURING THE DAY AS NEEDED What changed: See the new instructions.   Eliquis  5 MG Tabs tablet Generic drug: apixaban  TAKE 1 TABLET(5 MG) BY MOUTH TWICE DAILY. FOLLOW-UP   indapamide  1.25 MG tablet Commonly known as: LOZOL  Take 1.25 mg by mouth daily.   levothyroxine  125 MCG tablet Commonly known as: SYNTHROID  TAKE 1 TABLET BY MOUTH DAILY   magnesium  gluconate 500 (27 Mg) MG Tabs tablet Commonly known as: MAGONATE Take 1 tablet (500 mg total) by mouth daily at 6 (six) AM. What changed: additional instructions   metoprolol  succinate 25 MG 24 hr tablet Commonly known as: TOPROL -XL Take 1 tablet (25 mg total) by mouth daily.   MULTI-VITAMIN DAILY PO Take 1 tablet by mouth daily.   ondansetron  4 MG disintegrating tablet Commonly known as: ZOFRAN -ODT Take 1 tablet (4 mg total) by mouth every 8 (eight) hours as needed.   pantoprazole  40 MG tablet Commonly known as: PROTONIX  TAKE 1 TABLET(40 MG) BY MOUTH DAILY   polyethylene glycol 17 g packet Commonly known as: MiraLax  Take 17 g by mouth daily.   potassium chloride  SA 20 MEQ tablet Commonly known as: KLOR-CON  M 1 bid   pregabalin  25 MG capsule Commonly known as: LYRICA  Take 25 mg by mouth 2 (two) times daily.   sertraline  100 MG tablet Commonly known as:  ZOLOFT  TAKE 1 TABLET BY MOUTH EVERY DAY   sulfamethoxazole-trimethoprim 800-160 MG tablet Commonly known as: Bactrim DS Take 1 tablet by mouth 2 (two) times daily.   torsemide  20 MG tablet Commonly known as: DEMADEX  Take 1 tablet (20 mg total) by mouth daily.   Vitamin D3 50 MCG (2000 UT) Tabs Take 2,000 Units by mouth daily.         Discharge Exam: Filed Weights   10/26/24 2201  Weight: 80.2 kg    GENERAL:  Alert, pleasant, no acute distress  HEENT:  EOMI CARDIOVASCULAR:  RRR, no murmurs appreciated RESPIRATORY:  Clear to auscultation, no wheezing, rales, or rhonchi GASTROINTESTINAL:  Soft, nontender, nondistended EXTREMITIES:  No LE edema bilaterally NEURO:  No new focal deficits appreciated SKIN:  No rashes noted PSYCH:  Appropriate mood and affect     Condition at discharge: improving  The results of significant diagnostics from this hospitalization (including imaging, microbiology, ancillary and laboratory) are listed below for reference.   Imaging Studies: CT ABDOMEN PELVIS W CONTRAST Result Date: 10/27/2024 EXAM: CT ABDOMEN AND PELVIS WITH CONTRAST 10/27/2024 01:11:49 AM  TECHNIQUE: CT of the abdomen and pelvis was performed with the administration of 100 mL of iohexol  (OMNIPAQUE ) 300 MG/ML solution. Multiplanar reformatted images are provided for review. Automated exposure control, iterative reconstruction, and/or weight-based adjustment of the mA/kV was utilized to reduce the radiation dose to as low as reasonably achievable. COMPARISON: 10/13/2024 CLINICAL HISTORY: Abdominal pain, acute, nonlocalized. FINDINGS: LOWER CHEST: No focal infiltrate is seen in the lung bases. Hiatal hernia is again seen and stable. LIVER: Liver is within normal limits. GALLBLADDER AND BILE DUCTS: Gallbladder has been surgically removed. No biliary ductal dilatation. SPLEEN: The spleen is within normal limits. PANCREAS: The pancreas is within normal limits. ADRENAL GLANDS: The adrenal  glands are unremarkable. KIDNEYS, URETERS AND BLADDER: Kidneys demonstrate some slight decreased enhancement on the left on the initial images as well as on delayed images. Bilateral hydronephrosis and hydroureter are seen. This is slightly more prominent on the left than the right. This is felt to be related to local compression by the known enhancing mass lesion at the superior aspect of the vaginal cuff. These changes are similar to that noted on the prior exam. No cystic change is again identified and stable. The bladder is decompressed. GI AND BOWEL: Stomach again shows a moderate hiatal hernia. No obstructive or inflammatory changes of the colon are seen. The appendix is not well visualized and may have been surgically removed. No inflammatory changes to suggest appendicitis are seen. Small bowel is within normal limits. PERITONEUM AND RETROPERITONEUM: No ascites. No free air. VASCULATURE: Aortic calcifications are seen. LYMPH NODES: No significant lymphadenopathy is noted in the retroperitoneum. Left iliac adenopathy is noted, measuring up to 15 mm, best seen on image 69 of series 2. Prominent lymphadenopathy is noted. REPRODUCTIVE ORGANS: Known enhancing mass lesion at the superior aspect of the vaginal cuff, stable in appearance. . This again represents vaginal carcinoma. BONES AND SOFT TISSUES: No acute osseous abnormality is seen. Degenerative change of the lumbar spine is noted. Fat-containing right inguinal hernia is noted. IMPRESSION: 1. Bilateral hydronephrosis and hydroureter, slightly more prominent on the left, likely related to local compression by the known enhancing mass at the superior aspect of the vaginal cuff; findings are similar to the prior exam. 2. Left iliac adenopathy measuring up to 15 mm. Electronically signed by: Oneil Devonshire MD 10/27/2024 01:32 AM EST RP Workstation: [id number]   CT ABDOMEN PELVIS W CONTRAST Result Date: 10/13/2024 EXAM: CT ABDOMEN AND PELVIS WITH CONTRAST  10/13/2024 07:48:10 PM TECHNIQUE: CT of the abdomen and pelvis was performed with the administration of 100 mL of iohexol  (OMNIPAQUE ) 300 MG/ML solution. Multiplanar reformatted images are provided for review. Automated exposure control, iterative reconstruction, and/or weight-based adjustment of the mA/kV was utilized to reduce the radiation dose to as low as reasonably achievable. COMPARISON: CT pelvis dated 09/24/2024, CT abdomen/pelvis dated 08/28/2024, PET CT 04/20/2024. CLINICAL HISTORY: RLQ abdominal pain. FINDINGS: LOWER CHEST: Moderate hiatal hernia with postsurgical changes at the GE junction. LIVER: The liver is unremarkable. GALLBLADDER AND BILE DUCTS: Post cholecystectomy. No biliary ductal dilatation. SPLEEN: No acute abnormality. PANCREAS: No acute abnormality. ADRENAL GLANDS: No acute abnormality. KIDNEYS, URETERS AND BLADDER: No stones in the kidneys or ureters. Associated moderate left hydroureteronephrosis, new. No perinephric or periureteral stranding. GI AND BOWEL: Stomach demonstrates no acute abnormality. Prior appendectomy. Moderate right inguinal hernia containing a knuckle of bowel, unchanged. There is no bowel obstruction. PERITONEUM AND RETROPERITONEUM: No ascites. No free air. VASCULATURE: Aorta is normal in caliber. Atherosclerotic calcifications of the abdominal aorta  and branch vessels, although patent. LYMPH NODES: No lymphadenopathy. REPRODUCTIVE ORGANS: Status post hysterectomy. 7.5 x 5.2 x 6.9 cm peripherally enhancing low-density lesion along the vaginal cuff and posterior bladder dome (image 77), newly visualized/progressive from CT but at the site of hyperintensity on prior PET, suggesting vaginal cancer. BONES AND SOFT TISSUES: Mild changes within the mid lumbar spine. No acute osseous abnormality. No focal soft tissue abnormality. IMPRESSION: 1. Status post hysterectomy. 2. New/progressive 7.5 cm peripherally enhancing low-density lesion along the vaginal cuff and posterior  bladder dome, corresponding to prior PET hyperintensity, suggestive of vaginal cancer. This has significantly progressed from recent priors. 3. Associated new moderate left hydroureteronephrosis. 4. Moderate right inguinal hernia containing a knuckle of bowel, unchanged. 5. Prior appendectomy. Electronically signed by: Pinkie Pebbles MD 10/13/2024 08:01 PM EST RP Workstation: [id number]   US  RENAL Result Date: 10/12/2024 CLINICAL DATA:  Urinary retention. History of vaginal cancer with prior radiation treatment. EXAM: RENAL / URINARY TRACT ULTRASOUND COMPLETE COMPARISON:  CT 08/28/2024 and abdominal ultrasound 02/11/2021 FINDINGS: Right Kidney: Renal measurements: 0.7 x 9.1 x 5.2 cm = volume: 120 mL. Echogenicity within normal limits. No solid mass visualized. 4.4 cm cyst over the lower pole without significant change. Mild pelvic fullness without significant hydronephrosis. Left Kidney: Renal measurements: 11.1 x 4.8 x 4.7 cm = volume: 130 mL. Echogenicity within normal limits. Mild-to-moderate hydronephrosis which worse compared to the recent CT. This hydronephrosis is not resolve postvoid. No focal mass. Bladder: Not well visualized. Other: No free fluid. IMPRESSION: 1. Mild-to-moderate left hydronephrosis worse compared to the recent CT 08/28/2024. Bladder not well visualized. 2. 4.4 cm right renal cyst without significant change. Electronically Signed   By: Toribio Agreste M.D.   On: 10/12/2024 15:01    Microbiology: Results for orders placed or performed during the hospital encounter of 10/26/24  Culture, blood (Routine X 2) w Reflex to ID Panel     Status: None (Preliminary result)   Collection Time: 10/27/24  7:45 AM   Specimen: BLOOD LEFT ARM  Result Value Ref Range Status   Specimen Description   Final    BLOOD LEFT ARM Performed at [facility], [address], [place], [place] [address]    Special Requests   Final    BOTTLES DRAWN AEROBIC AND ANAEROBIC Blood Culture adequate  volume Performed at [facility], [address], [place], [place] [address]    Culture PENDING  Incomplete   Report Status PENDING  Incomplete  Culture, blood (Routine X 2) w Reflex to ID Panel     Status: None (Preliminary result)   Collection Time: 10/27/24  7:45 AM   Specimen: BLOOD RIGHT ARM  Result Value Ref Range Status   Specimen Description   Final    BLOOD RIGHT ARM Performed at [facility], [address] [place], [place] [address]    Special Requests   Final    BOTTLES DRAWN AEROBIC AND ANAEROBIC Blood Culture adequate volume Performed at [facility], [address] [place], [place] [address]    Culture PENDING  Incomplete   Report Status PENDING  Incomplete    Labs: CBC: Recent Labs  Lab 10/26/24 2237 10/28/24 0356  WBC 13.2* 12.2*  NEUTROABS 10.4*  --   HGB 8.5* 7.7*  HCT 26.3* 24.6*  MCV 103.1* 105.6*  PLT 425* 396   Basic Metabolic Panel: Recent Labs  Lab 10/26/24 2237 10/27/24 1309 10/28/24 0356  NA 143 131* 134*  K  2.3* 5.5* 4.5  CL 117* 97* 102  CO2 18* 23 24  GLUCOSE 64* 126* 103*  BUN 11 14 14   CREATININE 0.62 1.33* 1.36*  CALCIUM 5.4* 9.5 8.4*  MG 0.9* 3.3* 2.5*   Liver Function Tests: Recent Labs  Lab 10/26/24 2237  AST 13*  ALT <5  ALKPHOS 54  BILITOT <0.2  PROT 3.8*  ALBUMIN 1.8*   CBG: Recent Labs  Lab 10/27/24 0757  GLUCAP 89    Discharge time spent: 25 minutes.  Length of inpatient stay: 1 days  Signed: Carliss LELON Canales, DO [facility] 10/28/2024

## 2024-10-28 NOTE — TOC Transition Note (Signed)
 Transition of Care [facility]) - Discharge Note   Patient Details  Name: [name] MRN: [id number] Date of Birth: 04/19/1936  Transition of [facility]) CM/SW Contact:  Sonda Manuella Quill, RN Phone Number: 10/28/2024, 9:30 AM   Clinical Narrative:    D/C orders received; no IP CM needs.   Final next level of care: Home/Self Care Barriers to Discharge: No Barriers Identified   Patient Goals and CMS Choice Patient states their goals for this hospitalization and ongoing recovery are:: Home CMS Medicare.gov Compare Post Acute Care list provided to:: Patient Choice offered to / list presented to : Patient [place] ownership interest in [facility].provided to:: Patient    Discharge Placement                       Discharge Plan and Services Additional resources added to the After Visit Summary for                  DME Arranged: N/A DME Agency: NA       HH Arranged: NA HH Agency: NA        Social Drivers of Health (SDOH) Interventions SDOH Screenings   Food Insecurity: No Food Insecurity (10/27/2024)  Housing: Low Risk  (10/27/2024)  Transportation Needs: No Transportation Needs (10/27/2024)  Utilities: Not At Risk (10/27/2024)  Alcohol Screen: Low Risk  (07/07/2024)  Depression (PHQ2-9): Medium Risk (10/03/2024)  Financial Resource Strain: Low Risk  (07/07/2024)  Physical Activity: Inactive (07/07/2024)  Social Connections: Unknown (10/27/2024)  Stress: No Stress Concern Present (07/07/2024)  Tobacco Use: Low Risk  (10/26/2024)  Health Literacy: Adequate Health Literacy (07/07/2024)     Readmission Risk Interventions     No data to display

## 2024-10-28 NOTE — Evaluation (Signed)
 Occupational Therapy Evaluation Patient Details Name: [name] MRN: [id number] DOB: 01-10-1936 Today's Date: 10/28/2024   History of Present Illness   Pt is 88 yr old female who presented to the hospital on 10/26/24 with abdominal pain and weakness.  Pt admitted with electrolyte disturbances.  Pt with medical history including but not limited to vaginal cancer, last chemotherapy was on 08/28/2024, urinary retention and incontinence, hypertension, anemia of chronic disease, persistent atrial fibrillation on Eliquis      Clinical Impressions During the session today, the pt performed all assessed tasks with modified independence or better, including lower body dressing, sit to stand, and ambulating in the hall using a RW. She appears to be close to her baseline level of functioning for self-care management & she is not presenting with acute functional deficits that would warrant the need for further OT services. OT will sign off and recommend he return home at discharge.      If plan is discharge home, recommend the following:   Help with stairs or ramp for entrance;Assistance with cooking/housework     Functional Status Assessment   Patient has not had a recent decline in their functional status     Equipment Recommendations   Tub/shower seat     Recommendations for Other Services         Precautions/Restrictions   Restrictions Weight Bearing Restrictions Per Provider Order: No     Mobility Bed Mobility Overal bed mobility: Modified Independent Bed Mobility: Supine to Sit       Transfers Overall transfer level: Modified independent Equipment used: Rolling walker (2 wheels) Transfers: Sit to/from Stand Sit to Stand: Modified independent (Device/Increase time)         Balance     Sitting balance-Leahy Scale: Good       Standing balance-Leahy Scale: Good         ADL either performed or assessed with clinical judgement   ADL Overall ADL's :  Modified independent;Independent;At baseline          Pertinent Vitals/Pain Pain Assessment Pain Assessment: No/denies pain     Extremity/Trunk Assessment Upper Extremity Assessment Upper Extremity Assessment: Overall WFL for tasks assessed;Right hand dominant (BUE AROM and strength WFL)   Lower Extremity Assessment Lower Extremity Assessment: Overall WFL for tasks assessed (BUE AROM and strength WFL)       Communication Communication Communication: No apparent difficulties Factors Affecting Communication: Hearing impaired   Cognition Arousal: Alert Behavior During Therapy: WFL for tasks assessed/performed      OT - Cognition Comments: Oriented to person, month, and place. She recalled the year when given options.        Following commands: Intact       Cueing  General Comments   Cueing Techniques: Verbal cues              Home Living Family/patient expects to be discharged to:: Private residence Living Arrangements: Alone Available Help at Discharge: Family;Available PRN/intermittently Type of Home: House   Entrance Stairs-Number of Steps: 6   Home Layout: One level     Bathroom Shower/Tub: Walk-in shower         Home Equipment: Cane - single Librarian, Academic (2 wheels)          Prior Functioning/Environment Prior Level of Function : Independent/Modified Independent;Driving             Mobility Comments:  (She used a cane vs. RW for ambulation.) ADLs Comments:  (She was independent with ADLs, cooking, cleaning, and  driving. [place] and has a supportive family who checks in on her regularly.)    OT Problem List: Other (comment) (N/A)   OT Treatment/Interventions:   N/A     OT Goals(Current goals can be found in the care plan section)   Acute Rehab OT Goals OT Goal Formulation: All assessment and education complete, DC therapy   OT Frequency:   N/A       AM-PAC OT 6 Clicks Daily Activity     Outcome  Measure Help from another person eating meals?: None Help from another person taking care of personal grooming?: None Help from another person toileting, which includes using toliet, bedpan, or urinal?: None Help from another person bathing (including washing, rinsing, drying)?: None Help from another person to put on and taking off regular upper body clothing?: None Help from another person to put on and taking off regular lower body clothing?: None 6 Click Score: 24   End of Session Equipment Utilized During Treatment: Rolling walker (2 wheels) Nurse Communication: Mobility status  Activity Tolerance: Patient tolerated treatment well Patient left: in chair;with call bell/phone within reach  OT Visit Diagnosis: Muscle weakness (generalized) (M62.81)                Time: 8874-8860 OT Time Calculation (min): 14 min Charges:  OT General Charges $OT Visit: 1 Visit OT Evaluation $OT Eval Low Complexity: 1 Low    [name], OTR/L 10/28/2024, 1:44 PM

## 2024-10-30 ENCOUNTER — Ambulatory Visit: Payer: Self-pay | Admitting: Family Medicine

## 2024-10-30 ENCOUNTER — Ambulatory Visit: Admitting: Family Medicine

## 2024-10-30 ENCOUNTER — Telehealth: Payer: Self-pay | Admitting: *Deleted

## 2024-10-30 LAB — URINE CULTURE: Culture: 200 — AB

## 2024-10-30 NOTE — Transitions of Care (Post Inpatient/ED Visit) (Signed)
 10/30/2024  Name: [name] MRN: [id number] DOB: 29-Jul-1936  Today's TOC FU Call Status: Today's TOC FU Call Status:: Successful TOC FU Call Completed TOC FU Call Complete Date: 10/30/24  Patient's Name and Date of Birth confirmed. Name, DOB  Transition Care Management Follow-up Telephone Call Date of Discharge: 10/28/24 Discharge Facility: [facility]) How have you been since you were released from the hospital?: Better Any questions or concerns?: No  Items Reviewed: Did you receive and understand the discharge instructions provided?: Yes Medications obtained,verified, and reconciled?: Yes (Medications Reviewed) Any new allergies since your discharge?: No Dietary orders reviewed?: No Do you have support at home?: Yes People in Home [RPT]: alone Name of Support/Comfort Primary Source: Brooke  Medications Reviewed Today: Medications Reviewed Today     Reviewed by Kennieth Cathlean DEL, RN (Case Manager) on 10/30/24 at 1334  Med List Status: <None>   Medication Order Taking? Sig Documenting Provider Last Dose Status Informant  acetaminophen  (TYLENOL ) 650 MG CR tablet [id number] Yes Take 650 mg by mouth every 8 (eight) hours as needed for pain. [provider]  Active Self, Pharmacy Records, Family Member  albuterol  (VENTOLIN  HFA) 108 [phone] Base) MCG/ACT inhaler [id number] Yes INHALE 2 PUFFS INTO THE LUNGS EVERY 6 HOURS AS NEEDED FOR WHEEZING OR SHORTNESS OF BREATH Luking, Glendia LABOR, MD  Active Self, Pharmacy Records, Family Member  ALPRAZolam  (XANAX ) 0.5 MG tablet [id number] Yes TAKE 1 TABLET BY MOUTH EVERY NIGHT AT BEDTIME. MAY TAKE 1/2 TABLET DURING THE DAY AS NEEDED  Patient taking differently: Take 0.5 mg by mouth 2 (two) times daily as needed for anxiety. TAKE 1 TABLET BY MOUTH EVERY NIGHT AT BEDTIME. MAY TAKE 1/2 TABLET DURING THE DAY AS NEEDED   Luking, Glendia LABOR, MD  Active Self, Pharmacy Records, Family Member  Cholecalciferol (VITAMIN D3) 2000 UNITS TABS [id number]  Yes Take 2,000 Units by mouth daily. [provider]  Active Self, Pharmacy Records, Family Member  ELIQUIS  5 MG TABS tablet [id number] Yes TAKE 1 TABLET(5 MG) BY MOUTH TWICE DAILY. FOLLOW-UP Rana Lum CROME, NP  Active Self, Pharmacy Records, Family Member  indapamide  (LOZOL ) 1.25 MG tablet [id number] Yes Take 1.25 mg by mouth daily. [provider]  Active Self, Pharmacy Records, Family Member  levothyroxine  (SYNTHROID ) 125 MCG tablet [id number] Yes TAKE 1 TABLET BY MOUTH DAILY Luking, Glendia LABOR, MD  Active Self, Pharmacy Records, Family Member  magnesium  gluconate (MAGONATE) 500 (27 Mg) MG TABS tablet [id number] Yes Take 1 tablet (500 mg total) by mouth daily at 6 (six) AM.  Patient taking differently: Take 500 mg by mouth daily at 6 (six) AM. As needed   [name], MD  Active Self, Pharmacy Records, Family Member  metoprolol  succinate (TOPROL -XL) 25 MG 24 hr tablet [id number] Yes Take 1 tablet (25 mg total) by mouth daily. Mauro Elveria BROCKS, NP  Active Self, Pharmacy Records, Family Member  Multiple Vitamin (MULTI-VITAMIN DAILY PO) [id number] Yes Take 1 tablet by mouth daily. [provider]  Active Self, Pharmacy Records, Family Member  ondansetron  (ZOFRAN -ODT) 4 MG disintegrating tablet [id number] Yes Take 1 tablet (4 mg total) by mouth every 8 (eight) hours as needed. Francesca Elsie CROME, MD  Active Self, Pharmacy Records, Family Member  pantoprazole  (PROTONIX ) 40 MG tablet [id number] Yes TAKE 1 TABLET(40 MG) BY MOUTH DAILY Cook, Jayce G, DO  Active Self, Pharmacy Records, Family Member  polyethylene glycol (MIRALAX ) 17 g packet [id number] Yes Take 17 g by mouth daily. Darci Pore, MD  Active Self, Pharmacy Records, Family Member  potassium chloride  SA (KLOR-CON  M) 20 MEQ tablet [id number] Yes 1 bid [name], MD  Active Self, Pharmacy Records, Family Member  pregabalin  (LYRICA ) 25 MG capsule [id number] Yes Take 25 mg by mouth 2 (two) times daily.  [provider]  Active Self, Pharmacy Records, Family Member  sertraline  (ZOLOFT ) 100 MG tablet [id number] Yes TAKE 1 TABLET BY MOUTH EVERY DAY Luking, Glendia LABOR, MD  Active Self, Pharmacy Records, Family Member  sulfamethoxazole -trimethoprim  (BACTRIM  DS) 800-160 MG tablet [id number] Yes Take 1 tablet by mouth 2 (two) times daily. Arlon Carliss ORN, DO  Active   torsemide  (DEMADEX ) 20 MG tablet [id number] Yes Take 1 tablet (20 mg total) by mouth daily. [name], MD  Active Self, Pharmacy Records, Family Member            Home Care and Equipment/Supplies: Were Home Health Services Ordered?: NA Any new equipment or medical supplies ordered?: NA  Functional Questionnaire: Do you need assistance with bathing/showering or dressing?: No Do you need assistance with meal preparation?: No Do you need assistance with eating?: No Do you have difficulty maintaining continence: No Do you need assistance with getting out of bed/getting out of a chair/moving?: Yes (uses a cane/ per patient will start using a walker) Do you have difficulty managing or taking your medications?: Yes (grand daughter is helping with medications and cheanged to prepackage)  Follow up appointments reviewed: PCP Follow-up appointment confirmed?: Yes Date of PCP follow-up appointment?: 11/13/24 Follow-up Provider: Dr Glendia [facility] Follow-up appointment confirmed?: NA Do you need transportation to your follow-up appointment?: No Do you understand care options if your condition(s) worsen?: Yes-patient verbalized understanding  SDOH Interventions Today    Flowsheet Row Most Recent Value  SDOH Interventions   Food Insecurity Interventions Intervention Not Indicated  Housing Interventions Intervention Not Indicated  Transportation Interventions Intervention Not Indicated, Patient Resources (Friends/Family)  Utilities Interventions Intervention Not Indicated  Depression Interventions/Treatment   Medication    Goals Addressed             This Visit's Progress    VBCI Transitions of Care (TOC) Care Plan       Problems:  Recent Hospitalization for treatment of HLD and electrolyte disturbances Knowledge Deficit Related to electrolyte disturbances  Goal:  Over the next 30 days, the patient will not experience hospital readmission  Interventions:  Transitions of Care: Labs Monitor lab results for electrolyte imbalance Doctor Visits  - discussed the importance of doctor visits Referral to Longitudinal Nurse Case Manager for Ongoing follow-up  Patient Self Care Activities:  Attend all scheduled provider appointments Call pharmacy for medication refills 3-7 days in advance of running out of medications Call provider office for new concerns or questions  Notify RN Care Manager of TOC call rescheduling needs Participate in Transition of Care Program/Attend TOC scheduled calls Perform all self care activities independently  Take medications as prescribed   Monitor for muscle weakness, cramps and spasms.  Plan:  An initial telephone outreach has been scheduled for: 11/08/2024 Next PCP appointment scheduled for: 11/13/2024 Telephone follow up appointment with care management team member scheduled for:  11/08/2024 Mliss Creed 1:00 PM      Discussed and offered 30 day TOC program.  Patient accepted.  The patient has been provided with contact information for the care management team and has been advised to call with any health -related questions or concerns.  The patient verbalized understanding with current plan of  care.  The patient is directed to their insurance card regarding availability of benefits coverage   Cathlean Headland BSN RN [facility] Care Management Coordinator [email or web address] Direct Dial: [phone]  Fax: [phone] Website: [email or web address]

## 2024-10-31 ENCOUNTER — Ambulatory Visit: Payer: Self-pay

## 2024-10-31 NOTE — Telephone Encounter (Signed)
 Grandaughter states they would like to keep appt tomorrow with Dr Bluford

## 2024-10-31 NOTE — Telephone Encounter (Signed)
 According to patient's chart, family spoke with Tami at [facility] 12/2 at [facility] -  patient has an appointment scheduled for 12/3 with Dr. Bluford.   Message from [place] G sent at 10/31/2024  8:58 AM EST  Reason for Triage: pt granddaughter brooke said pt had an appt yesterday and cancelled but she is trying to reschedule I told her I can get her in with wheeler but she said she wants to specifically see luking she wants to talk to erica or tammy the receptionist to see if they can possibly squeeze her in with luking because he know exactly what's going on. Please follow up with brooke to assist [phone]

## 2024-11-01 ENCOUNTER — Encounter (HOSPITAL_COMMUNITY): Payer: Self-pay

## 2024-11-01 ENCOUNTER — Inpatient Hospital Stay: Admitting: Family Medicine

## 2024-11-01 ENCOUNTER — Other Ambulatory Visit: Payer: Self-pay

## 2024-11-01 ENCOUNTER — Emergency Department (HOSPITAL_COMMUNITY)

## 2024-11-01 ENCOUNTER — Emergency Department (HOSPITAL_COMMUNITY)
Admission: EM | Admit: 2024-11-01 | Discharge: 2024-11-01 | Disposition: A | Discharge status: Home / Self Care | Attending: Emergency Medicine | Admitting: Emergency Medicine

## 2024-11-01 DIAGNOSIS — R638 Other symptoms and signs concerning food and fluid intake: Secondary | ICD-10-CM | POA: Insufficient documentation

## 2024-11-01 DIAGNOSIS — I4891 Unspecified atrial fibrillation: Secondary | ICD-10-CM | POA: Diagnosis not present

## 2024-11-01 DIAGNOSIS — I10 Essential (primary) hypertension: Secondary | ICD-10-CM | POA: Insufficient documentation

## 2024-11-01 DIAGNOSIS — Z79899 Other long term (current) drug therapy: Secondary | ICD-10-CM | POA: Insufficient documentation

## 2024-11-01 DIAGNOSIS — R112 Nausea with vomiting, unspecified: Secondary | ICD-10-CM | POA: Diagnosis not present

## 2024-11-01 DIAGNOSIS — Z7901 Long term (current) use of anticoagulants: Secondary | ICD-10-CM | POA: Diagnosis not present

## 2024-11-01 DIAGNOSIS — R0789 Other chest pain: Secondary | ICD-10-CM | POA: Diagnosis not present

## 2024-11-01 DIAGNOSIS — R630 Anorexia: Secondary | ICD-10-CM

## 2024-11-01 DIAGNOSIS — Z8544 Personal history of malignant neoplasm of other female genital organs: Secondary | ICD-10-CM | POA: Insufficient documentation

## 2024-11-01 DIAGNOSIS — R197 Diarrhea, unspecified: Secondary | ICD-10-CM | POA: Diagnosis not present

## 2024-11-01 DIAGNOSIS — J8410 Pulmonary fibrosis, unspecified: Secondary | ICD-10-CM | POA: Diagnosis not present

## 2024-11-01 DIAGNOSIS — C52 Malignant neoplasm of vagina: Secondary | ICD-10-CM | POA: Diagnosis not present

## 2024-11-01 DIAGNOSIS — R000 Tachycardia, unspecified: Secondary | ICD-10-CM | POA: Diagnosis not present

## 2024-11-01 LAB — BASIC METABOLIC PANEL WITH GFR
Anion gap: 12 (ref 5–15)
BUN: 13 mg/dL (ref 8–23)
CO2: 27 mmol/L (ref 22–32)
Calcium: 8.7 mg/dL — ABNORMAL LOW (ref 8.9–10.3)
Chloride: 94 mmol/L — ABNORMAL LOW (ref 98–111)
Creatinine, Ser: 1.15 mg/dL — ABNORMAL HIGH (ref 0.44–1.00)
GFR, Estimated: 46 mL/min — ABNORMAL LOW (ref >60)
Glucose, Bld: 124 mg/dL — ABNORMAL HIGH (ref 70–99)
Potassium: 4.3 mmol/L (ref 3.5–5.1)
Sodium: 133 mmol/L — ABNORMAL LOW (ref 135–145)

## 2024-11-01 LAB — MAGNESIUM: Magnesium: 2.2 mg/dL (ref 1.7–2.4)

## 2024-11-01 LAB — CULTURE, BLOOD (ROUTINE X 2)
Culture: NO GROWTH
Culture: NO GROWTH
Special Requests: ADEQUATE
Special Requests: ADEQUATE

## 2024-11-01 LAB — HEPATIC FUNCTION PANEL
ALT: 15 U/L (ref 0–44)
AST: 33 U/L (ref 15–41)
Albumin: 3.4 g/dL — ABNORMAL LOW (ref 3.5–5.0)
Alkaline Phosphatase: 106 U/L (ref 38–126)
Bilirubin, Direct: 0.2 mg/dL (ref 0.0–0.2)
Indirect Bilirubin: 0.2 mg/dL — ABNORMAL LOW (ref 0.3–0.9)
Total Bilirubin: 0.4 mg/dL (ref 0.0–1.2)
Total Protein: 7 g/dL (ref 6.5–8.1)

## 2024-11-01 LAB — TROPONIN T, HIGH SENSITIVITY
Troponin T High Sensitivity: 37 ng/L — ABNORMAL HIGH (ref 0–19)
Troponin T High Sensitivity: 42 ng/L — ABNORMAL HIGH (ref 0–19)

## 2024-11-01 LAB — CBC
HCT: 28.9 % — ABNORMAL LOW (ref 36.0–46.0)
Hemoglobin: 9.2 g/dL — ABNORMAL LOW (ref 12.0–15.0)
MCH: 33.2 pg (ref 26.0–34.0)
MCHC: 31.8 g/dL (ref 30.0–36.0)
MCV: 104.3 fL — ABNORMAL HIGH (ref 80.0–100.0)
Platelets: 446 K/uL — ABNORMAL HIGH (ref 150–400)
RBC: 2.77 MIL/uL — ABNORMAL LOW (ref 3.87–5.11)
RDW: 14.8 % (ref 11.5–15.5)
WBC: 16.6 K/uL — ABNORMAL HIGH (ref 4.0–10.5)
nRBC: 0 % (ref 0.0–0.2)

## 2024-11-01 MED ORDER — APIXABAN 5 MG PO TABS
5.0000 mg | ORAL_TABLET | ORAL | Status: AC
  Administered 2024-11-01: 5 mg via ORAL
  Filled 2024-11-01: qty 1

## 2024-11-01 MED ORDER — METOPROLOL SUCCINATE ER 25 MG PO TB24
25.0000 mg | ORAL_TABLET | ORAL | Status: AC
  Administered 2024-11-01: 25 mg via ORAL
  Filled 2024-11-01: qty 1

## 2024-11-01 MED ORDER — MIRTAZAPINE 15 MG PO TABS: 15.0000 mg | ORAL_TABLET | Freq: Every day | ORAL | 1 refills | Status: DC

## 2024-11-01 MED ORDER — SODIUM CHLORIDE 0.9 % IV BOLUS
500.0000 mL | Freq: Once | INTRAVENOUS | Status: AC
  Administered 2024-11-01: 500 mL via INTRAVENOUS

## 2024-11-01 NOTE — Discharge Instructions (Signed)
 You were seen for your nausea, vomiting, and diarrhea in the emergency department.   At home, please continue your Zofran  for nausea and vomiting.  Take the Remeron at night to increase your appetite.  Please make sure to continue taking your metoprolol  and Eliquis  to prevent your heart rate from becoming elevated.  Check your MyChart online for the results of any tests that had not resulted by the time you left the emergency department.   Follow-up with your primary doctor in 2-3 days regarding your visit.  Follow-up with your GI doctor, cardiology, and oncology.  Return immediately to the emergency department if you experience any of the following: Palpitations lasting more than several minutes, fainting, worsening abdominal pain, vomiting despite the medicine, or any other concerning symptoms.    Thank you for visiting our Emergency Department. It was a pleasure taking care of you today.

## 2024-11-01 NOTE — Consult Note (Signed)
 [facility] [place] Consultation Hematology/Oncology  CONSULTING PHYSICIAN: [name]  REASON FOR CONSULT: Vaginal cancer followed by Dr. Lonn at [facility]    HISTORY OF PRESENT ILLNESS:   [name] is a 88 y.o. female with past medical history of vaginal cancer (last chemo in September 2025) who is followed by Dr. Lonn [facility].  She has past medical history significant for urinary retention, incontinence, hypertension, anemia, atrial fibrillation on Eliquis  who recently presented to [name] ED for palpitations, shortness of breath and chest pressure.  She was also having lightheaded sensation when she would stand.  On arrival, patient was in A-fib with RVR and given diltiazem.  Patient's daughter reports she has been in and out of the [facility] due to electrolyte derangement secondary to nausea vomiting and diarrhea.  Typically will have 1-2 episodes of nonbloody nonbilious emesis per day and approximately 4-6 loose stools per day.  Was recently on Bactrim  for a UTI.  She is followed closely by Dr. Lonn and was last evaluated on 10/12/2024.  She recently completed chemoradiation therapy with cisplatin in September 2025.  Most recent MRI showed disease control.  Plan is for repeat PET scan in December.   Discussed case with [name].   Based on review of medications, there is no culprit medications that would cause nausea/vomiting and diarrhea this far out from her chemotherapy.  I will reach out to Dr. Lonn tomorrow just so that she is aware.  I spoke with [name] and he believes patient will be discharged later today.  [name], AGNP-C Department of Hematology/Oncology [name] at [name]  Phone: [phone]  11/01/2024 4:02 PM

## 2024-11-01 NOTE — ED Triage Notes (Signed)
 Pt BIB ems for generalized chest pressure. Pt has hx of a-fib. Upon arrival EMS found pt to be in a-fib RVR, 19 mg diltiazem administered based off weight and pt converted with no complaints of chest pain during triage.

## 2024-11-01 NOTE — ED Provider Notes (Signed)
 [place] EMERGENCY DEPARTMENT AT [facility] Provider Note   CSN: [id number] Arrival date & time: 11/01/24  1025     Patient presents with: Tachycardia   [name] is a 88 y.o. female.  {Add pertinent medical, surgical, social history, OB history to HPI:2722} 88 year old female history of atrial fibrillation on metoprolol  and Eliquis , chronic diarrhea and nausea, electrolyte abnormalities, vaginal cancer status post radiation and chemotherapy (cisplatin) who presents to the emergency department with palpitations, shortness of breath, and chest pressure.  This morning patient patient started having the above symptoms and very very lightheaded when she would stand up.  Describes it as a chest tightness.  Went to her outpatient doctor and was found to be in A-fib with RVR and was given 19 mg of diltiazem.  She has since converted to normal sinus rhythm and says that her palpitations and chest discomfort have resolved.  Additional history obtained per daughter-in-law who reports that the patient has been in and out of the hospital because of chronic nausea and diarrhea and the electrolyte abnormalities that have accompanied it.  Has had SVT in the past as well.  They are unclear exactly what is causing her nausea vomiting and diarrhea.  Typically will have 1-2 episodes of nonbloody nonbilious emesis per day and approximately 4-6 loose stools per day.  Was on Bactrim  recently for UTI       Prior to Admission medications   Medication Sig Start Date End Date Taking? Authorizing Provider  acetaminophen  (TYLENOL ) 650 MG CR tablet Take 650 mg by mouth every 8 (eight) hours as needed for pain.    [provider]  albuterol  (VENTOLIN  HFA) 108 (90 Base) MCG/ACT inhaler INHALE 2 PUFFS INTO THE LUNGS EVERY 6 HOURS AS NEEDED FOR WHEEZING OR SHORTNESS OF BREATH 05/29/24   Luking, Glendia LABOR, MD  ALPRAZolam  (XANAX ) 0.5 MG tablet TAKE 1 TABLET BY MOUTH EVERY NIGHT AT BEDTIME. MAY TAKE  1/2 TABLET DURING THE DAY AS NEEDED Patient taking differently: Take 0.5 mg by mouth 2 (two) times daily as needed for anxiety. TAKE 1 TABLET BY MOUTH EVERY NIGHT AT BEDTIME. MAY TAKE 1/2 TABLET DURING THE DAY AS NEEDED 09/14/24   [name], MD  Cholecalciferol (VITAMIN D3) 2000 UNITS TABS Take 2,000 Units by mouth daily.    [provider]  ELIQUIS  5 MG TABS tablet TAKE 1 TABLET(5 MG) BY MOUTH TWICE DAILY. FOLLOW-UP 10/06/24   Rana Lum CROME, NP  indapamide  (LOZOL ) 1.25 MG tablet Take 1.25 mg by mouth daily. 10/18/24   [provider]  levothyroxine  (SYNTHROID ) 125 MCG tablet TAKE 1 TABLET BY MOUTH DAILY 08/25/24   [name], MD  magnesium  gluconate (MAGONATE) 500 (27 Mg) MG TABS tablet Take 1 tablet (500 mg total) by mouth daily at 6 (six) AM. Patient taking differently: Take 500 mg by mouth daily at 6 (six) AM. As needed 08/28/24   [name], MD  metoprolol  succinate (TOPROL -XL) 25 MG 24 hr tablet Take 1 tablet (25 mg total) by mouth daily. 07/13/24   Hoskins, Carolyn C, NP  Multiple Vitamin (MULTI-VITAMIN DAILY PO) Take 1 tablet by mouth daily.    [provider]  ondansetron  (ZOFRAN -ODT) 4 MG disintegrating tablet Take 1 tablet (4 mg total) by mouth every 8 (eight) hours as needed. 10/13/24   Francesca Elsie CROME, MD  pantoprazole  (PROTONIX ) 40 MG tablet TAKE 1 TABLET(40 MG) BY MOUTH DAILY 07/24/24   Cook, Jayce G, DO  polyethylene glycol (MIRALAX )  17 g packet Take 17 g by mouth daily. 09/20/24   Darci Pore, MD  potassium chloride  SA (KLOR-CON  M) 20 MEQ tablet 1 bid 09/27/24   [name], MD  pregabalin  (LYRICA ) 25 MG capsule Take 25 mg by mouth 2 (two) times daily. 08/24/24   [provider]  sertraline  (ZOLOFT ) 100 MG tablet TAKE 1 TABLET BY MOUTH EVERY DAY 02/21/24   [name], MD  sulfamethoxazole-trimethoprim (BACTRIM DS) 800-160 MG tablet Take 1 tablet by mouth 2 (two) times daily. 10/28/24   Arlon Carliss ORN, DO   torsemide  (DEMADEX ) 20 MG tablet Take 1 tablet (20 mg total) by mouth daily. 09/27/24   [name], MD    Allergies: Erythromycin, Aspirin, and Ibuprofen    Review of Systems  Updated Vital Signs BP 125/67 (BP Location: Right Arm)   Pulse (!) 102   Temp 98.2 F (36.8 C) (Oral)   Resp 19   Ht 5' 5.5 (1.664 m)   Wt 79.4 kg   SpO2 95%   BMI 28.68 kg/m   Physical Exam Vitals and nursing note reviewed.  Constitutional:      General: She is not in acute distress.    Appearance: She is well-developed.  HENT:     Head: Normocephalic and atraumatic.     Right Ear: External ear normal.     Left Ear: External ear normal.     Nose: Nose normal.  Eyes:     Extraocular Movements: Extraocular movements intact.     Conjunctiva/sclera: Conjunctivae normal.     Pupils: Pupils are equal, round, and reactive to light.  Cardiovascular:     Rate and Rhythm: Normal rate and regular rhythm.     Heart sounds: No murmur heard. Pulmonary:     Effort: Pulmonary effort is normal. No respiratory distress.     Breath sounds: Normal breath sounds.  Abdominal:     General: Abdomen is flat. There is no distension.     Palpations: Abdomen is soft. There is no mass.     Tenderness: There is no abdominal tenderness. There is no guarding.  Musculoskeletal:     Cervical back: Normal range of motion and neck supple.     Right lower leg: No edema.     Left lower leg: No edema.  Skin:    General: Skin is warm and dry.  Neurological:     Mental Status: She is alert and oriented to person, place, and time. Mental status is at baseline.  Psychiatric:        Mood and Affect: Mood normal.     (all labs ordered are listed, but only abnormal results are displayed) Labs Reviewed  CBC - Abnormal; Notable for the following components:      Result Value   WBC 16.6 (*)    RBC 2.77 (*)    Hemoglobin 9.2 (*)    HCT 28.9 (*)    MCV 104.3 (*)    Platelets 446 (*)    All other components within normal  limits  BASIC METABOLIC PANEL WITH GFR  TROPONIN T, HIGH SENSITIVITY    EKG: EKG Interpretation Date/Time:  Wednesday November 01 2024 10:34:14 EST Ventricular Rate:  112 PR Interval:  231 QRS Duration:  119 QT Interval:  358 QTC Calculation: 489 R Axis:   -7  Text Interpretation: atrial flutter variable conduction Nonspecific intraventricular conduction delay Minimal ST depression, anterolateral leads Confirmed by Yolande Charleston [id number]) on 11/01/2024 10:52:38 AM  Radiology: No results  found.  {Document cardiac monitor, telemetry assessment procedure when appropriate:32947} Procedures   Medications Ordered in the ED - No data to display  Clinical Course as of 11/01/24 1126  Wed Nov 01, 2024  1118 Creatinine(!): 1.15 Baseline of 0.9 [RP]  1119 Hemoglobin(!): 9.2 Baseline of 8.5 [RP]  1119 WBC(!): 16.6 Typically has mild leukocytosis ~13 [RP]  1119 Troponin T High Sensitivity(!): 37 Unchanged from prior [RP]    Clinical Course User Index [RP] [name], MD   {Click here for ABCD2, HEART and other calculators REFRESH Note before signing:1}                              Medical Decision Making Amount and/or Complexity of Data Reviewed Labs: ordered. Decision-making details documented in ED Course. Radiology: ordered.   ***  {Document critical care time when appropriate  Document review of labs and clinical decision tools ie CHADS2VASC2, etc  Document your independent review of radiology images and any outside records  Document your discussion with family members, caretakers and with consultants  Document social determinants of health affecting pt's care  Document your decision making why or why not admission, treatments were needed:32947:::1}   Final diagnoses:  None    ED Discharge Orders     None

## 2024-11-03 ENCOUNTER — Other Ambulatory Visit: Payer: Self-pay | Admitting: Hematology and Oncology

## 2024-11-08 ENCOUNTER — Telehealth: Payer: Self-pay | Admitting: *Deleted

## 2024-11-08 ENCOUNTER — Encounter: Payer: Self-pay | Admitting: *Deleted

## 2024-11-08 LAB — MOLECULAR PATHOLOGY

## 2024-11-09 ENCOUNTER — Inpatient Hospital Stay

## 2024-11-09 ENCOUNTER — Emergency Department (HOSPITAL_COMMUNITY)

## 2024-11-09 ENCOUNTER — Encounter: Payer: Self-pay | Admitting: Hematology and Oncology

## 2024-11-09 ENCOUNTER — Inpatient Hospital Stay: Admitting: Hematology and Oncology

## 2024-11-09 ENCOUNTER — Telehealth: Payer: Self-pay

## 2024-11-09 ENCOUNTER — Other Ambulatory Visit: Payer: Self-pay

## 2024-11-09 ENCOUNTER — Other Ambulatory Visit: Payer: Self-pay | Admitting: Hematology and Oncology

## 2024-11-09 ENCOUNTER — Encounter: Payer: Self-pay | Admitting: *Deleted

## 2024-11-09 ENCOUNTER — Inpatient Hospital Stay (HOSPITAL_COMMUNITY)
Admission: EM | Admit: 2024-11-09 | Discharge: 2024-11-13 | DRG: 689 | Disposition: A | Source: Home / Self Care | Attending: Internal Medicine | Admitting: Internal Medicine

## 2024-11-09 ENCOUNTER — Inpatient Hospital Stay: Admitting: Gynecologic Oncology

## 2024-11-09 ENCOUNTER — Emergency Department (HOSPITAL_COMMUNITY)
Admit: 2024-11-09 | Discharge: 2024-11-09 | Disposition: A | Discharge status: Home / Self Care | Attending: Emergency Medicine | Admitting: Emergency Medicine

## 2024-11-09 VITALS — BP 110/60 | HR 68 | Temp 97.6°F | Resp 17 | Ht 65.5 in | Wt 170.0 lb

## 2024-11-09 VITALS — BP 119/80 | HR 86 | Temp 97.6°F | Resp 18

## 2024-11-09 DIAGNOSIS — N3945 Continuous leakage: Secondary | ICD-10-CM | POA: Insufficient documentation

## 2024-11-09 DIAGNOSIS — D539 Nutritional anemia, unspecified: Secondary | ICD-10-CM | POA: Diagnosis present

## 2024-11-09 DIAGNOSIS — R531 Weakness: Secondary | ICD-10-CM | POA: Insufficient documentation

## 2024-11-09 DIAGNOSIS — C52 Malignant neoplasm of vagina: Secondary | ICD-10-CM

## 2024-11-09 DIAGNOSIS — N950 Postmenopausal bleeding: Secondary | ICD-10-CM | POA: Diagnosis present

## 2024-11-09 DIAGNOSIS — N390 Urinary tract infection, site not specified: Secondary | ICD-10-CM | POA: Diagnosis not present

## 2024-11-09 DIAGNOSIS — K921 Melena: Secondary | ICD-10-CM

## 2024-11-09 DIAGNOSIS — N1330 Unspecified hydronephrosis: Secondary | ICD-10-CM | POA: Insufficient documentation

## 2024-11-09 DIAGNOSIS — E785 Hyperlipidemia, unspecified: Secondary | ICD-10-CM | POA: Diagnosis present

## 2024-11-09 DIAGNOSIS — D6481 Anemia due to antineoplastic chemotherapy: Secondary | ICD-10-CM | POA: Diagnosis not present

## 2024-11-09 DIAGNOSIS — T451X5A Adverse effect of antineoplastic and immunosuppressive drugs, initial encounter: Secondary | ICD-10-CM | POA: Diagnosis not present

## 2024-11-09 DIAGNOSIS — R600 Localized edema: Secondary | ICD-10-CM | POA: Diagnosis not present

## 2024-11-09 DIAGNOSIS — I4729 Other ventricular tachycardia: Secondary | ICD-10-CM | POA: Diagnosis present

## 2024-11-09 DIAGNOSIS — J84112 Idiopathic pulmonary fibrosis: Secondary | ICD-10-CM | POA: Diagnosis present

## 2024-11-09 DIAGNOSIS — Z9221 Personal history of antineoplastic chemotherapy: Secondary | ICD-10-CM | POA: Insufficient documentation

## 2024-11-09 DIAGNOSIS — Z66 Do not resuscitate: Secondary | ICD-10-CM | POA: Diagnosis present

## 2024-11-09 DIAGNOSIS — N179 Acute kidney failure, unspecified: Secondary | ICD-10-CM | POA: Diagnosis not present

## 2024-11-09 DIAGNOSIS — R32 Unspecified urinary incontinence: Secondary | ICD-10-CM

## 2024-11-09 DIAGNOSIS — N136 Pyonephrosis: Secondary | ICD-10-CM | POA: Diagnosis present

## 2024-11-09 DIAGNOSIS — I4892 Unspecified atrial flutter: Secondary | ICD-10-CM | POA: Diagnosis not present

## 2024-11-09 DIAGNOSIS — M7989 Other specified soft tissue disorders: Secondary | ICD-10-CM | POA: Diagnosis not present

## 2024-11-09 DIAGNOSIS — D849 Immunodeficiency, unspecified: Secondary | ICD-10-CM | POA: Diagnosis present

## 2024-11-09 DIAGNOSIS — C9510 Chronic leukemia of unspecified cell type not having achieved remission: Secondary | ICD-10-CM | POA: Diagnosis present

## 2024-11-09 DIAGNOSIS — E039 Hypothyroidism, unspecified: Secondary | ICD-10-CM | POA: Diagnosis present

## 2024-11-09 DIAGNOSIS — I4719 Other supraventricular tachycardia: Secondary | ICD-10-CM | POA: Diagnosis present

## 2024-11-09 DIAGNOSIS — I4821 Permanent atrial fibrillation: Secondary | ICD-10-CM | POA: Diagnosis present

## 2024-11-09 DIAGNOSIS — E860 Dehydration: Secondary | ICD-10-CM | POA: Insufficient documentation

## 2024-11-09 DIAGNOSIS — I480 Paroxysmal atrial fibrillation: Secondary | ICD-10-CM | POA: Diagnosis present

## 2024-11-09 DIAGNOSIS — I5032 Chronic diastolic (congestive) heart failure: Secondary | ICD-10-CM | POA: Diagnosis present

## 2024-11-09 DIAGNOSIS — R601 Generalized edema: Secondary | ICD-10-CM | POA: Insufficient documentation

## 2024-11-09 DIAGNOSIS — F32A Depression, unspecified: Secondary | ICD-10-CM | POA: Diagnosis present

## 2024-11-09 DIAGNOSIS — A4150 Gram-negative sepsis, unspecified: Secondary | ICD-10-CM | POA: Diagnosis not present

## 2024-11-09 DIAGNOSIS — Z515 Encounter for palliative care: Secondary | ICD-10-CM | POA: Diagnosis not present

## 2024-11-09 DIAGNOSIS — Z923 Personal history of irradiation: Secondary | ICD-10-CM | POA: Insufficient documentation

## 2024-11-09 DIAGNOSIS — D62 Acute posthemorrhagic anemia: Secondary | ICD-10-CM | POA: Diagnosis present

## 2024-11-09 DIAGNOSIS — A419 Sepsis, unspecified organism: Principal | ICD-10-CM

## 2024-11-09 DIAGNOSIS — Z7901 Long term (current) use of anticoagulants: Secondary | ICD-10-CM | POA: Diagnosis not present

## 2024-11-09 DIAGNOSIS — D479 Neoplasm of uncertain behavior of lymphoid, hematopoietic and related tissue, unspecified: Secondary | ICD-10-CM | POA: Diagnosis present

## 2024-11-09 DIAGNOSIS — N939 Abnormal uterine and vaginal bleeding, unspecified: Secondary | ICD-10-CM

## 2024-11-09 DIAGNOSIS — I4819 Other persistent atrial fibrillation: Secondary | ICD-10-CM | POA: Diagnosis present

## 2024-11-09 DIAGNOSIS — D61818 Other pancytopenia: Secondary | ICD-10-CM

## 2024-11-09 DIAGNOSIS — F419 Anxiety disorder, unspecified: Secondary | ICD-10-CM | POA: Diagnosis present

## 2024-11-09 DIAGNOSIS — Z7189 Other specified counseling: Secondary | ICD-10-CM

## 2024-11-09 DIAGNOSIS — Z1152 Encounter for screening for COVID-19: Secondary | ICD-10-CM | POA: Diagnosis not present

## 2024-11-09 DIAGNOSIS — I10 Essential (primary) hypertension: Secondary | ICD-10-CM | POA: Diagnosis present

## 2024-11-09 DIAGNOSIS — I110 Hypertensive heart disease with heart failure: Secondary | ICD-10-CM | POA: Diagnosis present

## 2024-11-09 DIAGNOSIS — K219 Gastro-esophageal reflux disease without esophagitis: Secondary | ICD-10-CM | POA: Diagnosis present

## 2024-11-09 DIAGNOSIS — G9341 Metabolic encephalopathy: Secondary | ICD-10-CM | POA: Diagnosis present

## 2024-11-09 DIAGNOSIS — D72820 Lymphocytosis (symptomatic): Secondary | ICD-10-CM | POA: Diagnosis present

## 2024-11-09 LAB — URINALYSIS, COMPLETE (UACMP) WITH MICROSCOPIC
Glucose, UA: NEGATIVE mg/dL
Ketones, ur: NEGATIVE mg/dL
Nitrite: POSITIVE — AB
Protein, ur: >=300 mg/dL — AB
RBC / HPF: >50 RBC/hpf (ref 0–5)
Specific Gravity, Urine: 1.025 (ref 1.005–1.030)
WBC, UA: >50 WBC/hpf (ref 0–5)
pH: 7 (ref 5.0–8.0)

## 2024-11-09 LAB — CBC WITH DIFFERENTIAL/PLATELET
Abs Immature Granulocytes: 0.14 K/uL — ABNORMAL HIGH (ref 0.00–0.07)
Abs Immature Granulocytes: 0.11 K/uL — ABNORMAL HIGH (ref 0.00–0.07)
Basophils Absolute: 0.1 K/uL (ref 0.0–0.1)
Basophils Absolute: 0 K/uL (ref 0.0–0.1)
Basophils Relative: 0 %
Basophils Relative: 0 %
Eosinophils Absolute: 0 K/uL (ref 0.0–0.5)
Eosinophils Absolute: 0 K/uL (ref 0.0–0.5)
Eosinophils Relative: 0 %
Eosinophils Relative: 0 %
HCT: 26.6 % — ABNORMAL LOW (ref 36.0–46.0)
HCT: 26 % — ABNORMAL LOW (ref 36.0–46.0)
Hemoglobin: 8.5 g/dL — ABNORMAL LOW (ref 12.0–15.0)
Hemoglobin: 8.2 g/dL — ABNORMAL LOW (ref 12.0–15.0)
Immature Granulocytes: 1 %
Immature Granulocytes: 1 %
Lymphocytes Relative: 8 %
Lymphocytes Relative: 14 %
Lymphs Abs: 1.2 K/uL (ref 0.7–4.0)
Lymphs Abs: 2.1 K/uL (ref 0.7–4.0)
MCH: 31.8 pg (ref 26.0–34.0)
MCH: 32.8 pg (ref 26.0–34.0)
MCHC: 32 g/dL (ref 30.0–36.0)
MCHC: 31.5 g/dL (ref 30.0–36.0)
MCV: 99.6 fL (ref 80.0–100.0)
MCV: 104 fL — ABNORMAL HIGH (ref 80.0–100.0)
Monocytes Absolute: 0.8 K/uL (ref 0.1–1.0)
Monocytes Absolute: 0.7 K/uL (ref 0.1–1.0)
Monocytes Relative: 5 %
Monocytes Relative: 4 %
Neutro Abs: 13.3 K/uL — ABNORMAL HIGH (ref 1.7–7.7)
Neutro Abs: 12 K/uL — ABNORMAL HIGH (ref 1.7–7.7)
Neutrophils Relative %: 86 %
Neutrophils Relative %: 81 %
Platelets: 484 K/uL — ABNORMAL HIGH (ref 150–400)
Platelets: 457 K/uL — ABNORMAL HIGH (ref 150–400)
RBC: 2.67 MIL/uL — ABNORMAL LOW (ref 3.87–5.11)
RBC: 2.5 MIL/uL — ABNORMAL LOW (ref 3.87–5.11)
RDW: 14.6 % (ref 11.5–15.5)
RDW: 14.7 % (ref 11.5–15.5)
WBC: 15.4 K/uL — ABNORMAL HIGH (ref 4.0–10.5)
WBC: 14.9 K/uL — ABNORMAL HIGH (ref 4.0–10.5)
nRBC: 0 % (ref 0.0–0.2)
nRBC: 0 % (ref 0.0–0.2)

## 2024-11-09 LAB — COMPREHENSIVE METABOLIC PANEL WITH GFR
ALT: 12 U/L (ref 0–44)
ALT: 10 U/L (ref 0–44)
AST: 20 U/L (ref 15–41)
AST: 18 U/L (ref 15–41)
Albumin: 3.2 g/dL — ABNORMAL LOW (ref 3.5–5.0)
Albumin: 3 g/dL — ABNORMAL LOW (ref 3.5–5.0)
Alkaline Phosphatase: 109 U/L (ref 38–126)
Alkaline Phosphatase: 106 U/L (ref 38–126)
Anion gap: 12 (ref 5–15)
Anion gap: 12 (ref 5–15)
BUN: 17 mg/dL (ref 8–23)
BUN: 21 mg/dL (ref 8–23)
CO2: 25 mmol/L (ref 22–32)
CO2: 23 mmol/L (ref 22–32)
Calcium: 8.9 mg/dL (ref 8.9–10.3)
Calcium: 8.3 mg/dL — ABNORMAL LOW (ref 8.9–10.3)
Chloride: 95 mmol/L — ABNORMAL LOW (ref 98–111)
Chloride: 97 mmol/L — ABNORMAL LOW (ref 98–111)
Creatinine, Ser: 1.63 mg/dL — ABNORMAL HIGH (ref 0.44–1.00)
Creatinine, Ser: 1.53 mg/dL — ABNORMAL HIGH (ref 0.44–1.00)
GFR, Estimated: 30 mL/min — ABNORMAL LOW (ref >60)
GFR, Estimated: 33 mL/min — ABNORMAL LOW (ref >60)
Glucose, Bld: 113 mg/dL — ABNORMAL HIGH (ref 70–99)
Glucose, Bld: 189 mg/dL — ABNORMAL HIGH (ref 70–99)
Potassium: 4.7 mmol/L (ref 3.5–5.1)
Potassium: 4.4 mmol/L (ref 3.5–5.1)
Sodium: 133 mmol/L — ABNORMAL LOW (ref 135–145)
Sodium: 132 mmol/L — ABNORMAL LOW (ref 135–145)
Total Bilirubin: 0.4 mg/dL (ref 0.0–1.2)
Total Bilirubin: 0.2 mg/dL (ref 0.0–1.2)
Total Protein: 7 g/dL (ref 6.5–8.1)
Total Protein: 6.5 g/dL (ref 6.5–8.1)

## 2024-11-09 LAB — RESP PANEL BY RT-PCR (RSV, FLU A&B, COVID)  RVPGX2
Influenza A by PCR: NEGATIVE
Influenza B by PCR: NEGATIVE
Resp Syncytial Virus by PCR: NEGATIVE
SARS Coronavirus 2 by RT PCR: NEGATIVE

## 2024-11-09 LAB — IRON AND IRON BINDING CAPACITY (CC-WL,HP ONLY)
Iron: 24 ug/dL — ABNORMAL LOW (ref 28–170)
Saturation Ratios: 12 % (ref 10.4–31.8)
TIBC: 202 ug/dL — ABNORMAL LOW (ref 250–450)
UIBC: 178 ug/dL

## 2024-11-09 LAB — URINALYSIS, ROUTINE W REFLEX MICROSCOPIC
Bacteria, UA: NONE SEEN
Glucose, UA: NEGATIVE mg/dL
Ketones, ur: NEGATIVE mg/dL
Nitrite: POSITIVE — AB
Protein, ur: >=300 mg/dL — AB
Specific Gravity, Urine: 1.024 (ref 1.005–1.030)
pH: 7 (ref 5.0–8.0)

## 2024-11-09 LAB — FERRITIN: Ferritin: 349 ng/mL — ABNORMAL HIGH (ref 11–307)

## 2024-11-09 LAB — CG4 I-STAT (LACTIC ACID)
Lactic Acid, Venous: 1.7 mmol/L (ref 0.5–1.9)
Lactic Acid, Venous: 1.1 mmol/L (ref 0.5–1.9)

## 2024-11-09 LAB — MAGNESIUM: Magnesium: 1.8 mg/dL (ref 1.7–2.4)

## 2024-11-09 LAB — TROPONIN T, HIGH SENSITIVITY
Troponin T High Sensitivity: 38 ng/L — ABNORMAL HIGH (ref 0–19)
Troponin T High Sensitivity: 41 ng/L — ABNORMAL HIGH (ref 0–19)

## 2024-11-09 LAB — PRO BRAIN NATRIURETIC PEPTIDE: Pro Brain Natriuretic Peptide: 3009 pg/mL — ABNORMAL HIGH (ref <300.0)

## 2024-11-09 LAB — LIPASE, BLOOD: Lipase: 21 U/L (ref 11–51)

## 2024-11-09 MED ORDER — POTASSIUM CHLORIDE CRYS ER 20 MEQ PO TBCR: 20.0000 meq | EXTENDED_RELEASE_TABLET | Freq: Every day | ORAL | Status: DC

## 2024-11-09 MED ORDER — METOPROLOL TARTRATE 5 MG/5ML IV SOLN
5.0000 mg | Freq: Once | INTRAVENOUS | Status: AC
  Administered 2024-11-09: 5 mg via INTRAVENOUS
  Filled 2024-11-09: qty 5

## 2024-11-09 MED ORDER — SODIUM CHLORIDE 0.9 % IV SOLN
1.0000 g | Freq: Once | INTRAVENOUS | Status: AC
  Administered 2024-11-09: 1 g via INTRAVENOUS
  Filled 2024-11-09: qty 10

## 2024-11-09 MED ORDER — MAGNESIUM GLUCONATE 500 (27 MG) MG PO TABS
500.0000 mg | ORAL_TABLET | Freq: Every day | ORAL | Status: DC
  Administered 2024-11-10 – 2024-11-13 (×4): 500 mg via ORAL
  Filled 2024-11-09 (×5): qty 1

## 2024-11-09 MED ORDER — DILTIAZEM HCL 25 MG/5ML IV SOLN
10.0000 mg | Freq: Once | INTRAVENOUS | Status: AC
  Administered 2024-11-09: 10 mg via INTRAVENOUS
  Filled 2024-11-09: qty 5

## 2024-11-09 MED ORDER — SODIUM CHLORIDE 0.9 % IV SOLN
2.0000 g | INTRAVENOUS | Status: DC
  Administered 2024-11-10 – 2024-11-13 (×4): 2 g via INTRAVENOUS
  Filled 2024-11-09 (×4): qty 20

## 2024-11-09 MED ORDER — INDAPAMIDE 1.25 MG PO TABS
1.2500 mg | ORAL_TABLET | Freq: Every day | ORAL | Status: DC
  Filled 2024-11-09: qty 1

## 2024-11-09 MED ORDER — ADULT MULTIVITAMIN W/MINERALS CH
1.0000 | ORAL_TABLET | Freq: Every day | ORAL | Status: DC
  Administered 2024-11-10: 1 via ORAL
  Filled 2024-11-09: qty 1

## 2024-11-09 MED ORDER — VITAMIN D 25 MCG (1000 UNIT) PO TABS: 2000.0000 [IU] | ORAL_TABLET | Freq: Every day | ORAL | Status: DC

## 2024-11-09 MED ORDER — TORSEMIDE 20 MG PO TABS: 20.0000 mg | ORAL_TABLET | Freq: Every day | ORAL | Status: DC

## 2024-11-09 MED ORDER — PANTOPRAZOLE SODIUM 40 MG PO TBEC
40.0000 mg | DELAYED_RELEASE_TABLET | Freq: Every day | ORAL | Status: DC
  Administered 2024-11-10 – 2024-11-13 (×4): 40 mg via ORAL
  Filled 2024-11-09 (×4): qty 1

## 2024-11-09 MED ORDER — ONDANSETRON HCL 4 MG/2ML IJ SOLN: 4.0000 mg | Freq: Four times a day (QID) | INTRAMUSCULAR | Status: DC | PRN

## 2024-11-09 MED ORDER — ACETAMINOPHEN 650 MG RE SUPP: 650.0000 mg | Freq: Four times a day (QID) | RECTAL | Status: DC | PRN

## 2024-11-09 MED ORDER — SODIUM CHLORIDE 0.9 % IV SOLN: Freq: Once | INTRAVENOUS | Status: AC

## 2024-11-09 MED ORDER — MIRTAZAPINE 15 MG PO TABS
15.0000 mg | ORAL_TABLET | Freq: Every day | ORAL | Status: DC
  Administered 2024-11-09 – 2024-11-12 (×4): 15 mg via ORAL
  Filled 2024-11-09 (×4): qty 1

## 2024-11-09 MED ORDER — SODIUM CHLORIDE 0.9 % IV BOLUS
500.0000 mL | Freq: Once | INTRAVENOUS | Status: AC
  Administered 2024-11-09: 500 mL via INTRAVENOUS

## 2024-11-09 MED ORDER — APIXABAN 5 MG PO TABS
5.0000 mg | ORAL_TABLET | Freq: Two times a day (BID) | ORAL | Status: DC
  Administered 2024-11-09: 5 mg via ORAL
  Filled 2024-11-09: qty 1

## 2024-11-09 MED ORDER — LEVOTHYROXINE SODIUM 25 MCG PO TABS
125.0000 ug | ORAL_TABLET | Freq: Every day | ORAL | Status: DC
  Administered 2024-11-10 – 2024-11-13 (×4): 125 ug via ORAL
  Filled 2024-11-09 (×4): qty 1

## 2024-11-09 MED ORDER — ALBUTEROL SULFATE (2.5 MG/3ML) 0.083% IN NEBU: 3.0000 mL | INHALATION_SOLUTION | Freq: Four times a day (QID) | RESPIRATORY_TRACT | Status: DC | PRN

## 2024-11-09 MED ORDER — PREGABALIN 50 MG PO CAPS
50.0000 mg | ORAL_CAPSULE | Freq: Every morning | ORAL | Status: DC
  Administered 2024-11-10: 50 mg via ORAL
  Filled 2024-11-09: qty 1

## 2024-11-09 MED ORDER — SERTRALINE HCL 50 MG PO TABS: 100.0000 mg | ORAL_TABLET | Freq: Every day | ORAL | Status: DC

## 2024-11-09 MED ORDER — POLYVINYL ALCOHOL 1.4 % OP SOLN: 1.0000 [drp] | Freq: Three times a day (TID) | OPHTHALMIC | Status: DC | PRN

## 2024-11-09 MED ORDER — DIPHENOXYLATE-ATROPINE 2.5-0.025 MG PO TABS: 1.0000 | ORAL_TABLET | Freq: Four times a day (QID) | ORAL | Status: DC | PRN

## 2024-11-09 MED ORDER — ACETAMINOPHEN 500 MG PO TABS
1000.0000 mg | ORAL_TABLET | Freq: Three times a day (TID) | ORAL | Status: DC | PRN
  Administered 2024-11-11 – 2024-11-13 (×3): 1000 mg via ORAL
  Filled 2024-11-09 (×3): qty 2

## 2024-11-09 MED ORDER — ONDANSETRON 4 MG PO TBDP: 4.0000 mg | ORAL_TABLET | Freq: Three times a day (TID) | ORAL | Status: DC | PRN

## 2024-11-09 MED ORDER — LIDOCAINE-PRILOCAINE 2.5-2.5 % EX CREA: 1.0000 | TOPICAL_CREAM | CUTANEOUS | Status: DC | PRN

## 2024-11-09 MED ORDER — ALPRAZOLAM 0.25 MG PO TABS
0.2500 mg | ORAL_TABLET | Freq: Every day | ORAL | Status: DC | PRN
  Administered 2024-11-13: 11:00:00 0.25 mg via ORAL
  Filled 2024-11-09: qty 1

## 2024-11-09 MED ORDER — POLYETHYLENE GLYCOL 3350 17 G PO PACK
17.0000 g | PACK | Freq: Every day | ORAL | Status: DC
  Administered 2024-11-10 – 2024-11-13 (×4): 17 g via ORAL
  Filled 2024-11-09 (×4): qty 1

## 2024-11-09 MED ORDER — ALPRAZOLAM 0.5 MG PO TABS
0.5000 mg | ORAL_TABLET | Freq: Every day | ORAL | Status: DC
  Administered 2024-11-09 – 2024-11-12 (×4): 0.5 mg via ORAL
  Filled 2024-11-09 (×4): qty 1

## 2024-11-09 MED ORDER — LACTATED RINGERS IV SOLN
150.0000 mL/h | INTRAVENOUS | Status: DC
  Administered 2024-11-09 – 2024-11-10 (×2): 150 mL/h via INTRAVENOUS

## 2024-11-09 MED ORDER — ONDANSETRON HCL 4 MG PO TABS: 4.0000 mg | ORAL_TABLET | Freq: Four times a day (QID) | ORAL | Status: DC | PRN

## 2024-11-09 MED ORDER — ACETAMINOPHEN 325 MG PO TABS: 650.0000 mg | ORAL_TABLET | Freq: Four times a day (QID) | ORAL | Status: DC | PRN

## 2024-11-09 MED ORDER — METOPROLOL SUCCINATE ER 25 MG PO TB24
25.0000 mg | ORAL_TABLET | Freq: Every day | ORAL | Status: DC
  Administered 2024-11-09 – 2024-11-11 (×3): 25 mg via ORAL
  Filled 2024-11-09 (×3): qty 1

## 2024-11-09 NOTE — Assessment & Plan Note (Addendum)
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, CARIS: P53 mutation, BRAF negative, MSI stable, low tumor mutation burden 41mut/Mb, HER2/neu score 0, negative, PD-L1 0%  She has received concurrent chemoradiation therapy with cisplatin, all treatment completed by September 2025 Her chemotherapy treatment course is complicated by anemia, mild fluid retention and mild vaginal bleeding Recent MRI show improvement of disease control She has been in the emergency room/hospitalization x 5 recently I suspect she is developing multiple side effects from treatment She will be evaluated by GYN surgeon today PET CT scan is scheduled to be done after Christmas I will see her in January for further follow-up

## 2024-11-09 NOTE — Assessment & Plan Note (Addendum)
 She has profound urinary incontinence She is being evaluated for possible fistula I will order urinalysis and urine culture I told her granddaughter to start her on Bactrim  as prescribed, based on recent culture and sensitivity

## 2024-11-09 NOTE — ED Triage Notes (Signed)
 [facility]. Pt received one liter of fluids, after she began having chest tightness. Pt HR found to be 180s.

## 2024-11-09 NOTE — Assessment & Plan Note (Addendum)
 She has multifactorial anemia, anemia of chronic disease and recent bleeding We will hold anticoagulation therapy for few days Iron studies are pending She does not need transfusion support today

## 2024-11-09 NOTE — Progress Notes (Signed)
 [facility] OFFICE PROGRESS NOTE  Patient Care Team: [name], MD as PCP - General (Family Medicine) [name], Jerel, MD as PCP - Cardiology (Cardiology) Shaaron, Lamar HERO, MD as Consulting Physician (Gastroenterology) [facility] Lonn Hicks, MD as Consulting Physician (Hematology and Oncology) Viktoria Comer SAUNDERS, MD as Consulting Physician (Gynecologic Oncology) Aura Mliss LABOR, RN as [facility] Care Management  Assessment & Plan Urinary incontinence, unspecified type She has profound urinary incontinence She is being evaluated for possible fistula I will order urinalysis and urine culture I told her granddaughter to start her on Bactrim  as prescribed, based on recent culture and sensitivity Vaginal cancer [facility])  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, CARIS: P53 mutation, BRAF negative, MSI stable, low tumor mutation burden 62mut/Mb, HER2/neu score 0, negative, PD-L1 0%  She has received concurrent chemoradiation therapy with cisplatin, all treatment completed by September 2025 Her chemotherapy treatment course is complicated by anemia, mild fluid retention and mild vaginal bleeding Recent MRI show improvement of disease control She has been in the emergency room/hospitalization x 5 recently I suspect she is developing multiple side effects from treatment She will be evaluated by GYN surgeon today PET CT scan is scheduled to be done after Christmas I will see her in January for further follow-up Anemia due to antineoplastic chemotherapy She has multifactorial anemia, anemia of chronic disease and recent bleeding We will hold anticoagulation therapy for few days Iron studies are pending She does not need transfusion support today Acute  renal failure, unspecified acute renal failure type She has acute renal failure today could be due to recent hydronephrosis and dehydration I will order IV fluid support today, tomorrow and next week Goals of care, counseling/discussion Overall, she is very frail She had recurrent admissions to the hospital I have long discussion with her granddaughter today about possibility of comfort care with hospice versus active supportive care Ultimately, she is undecided We will schedule IV fluid support for now  Orders Placed This Encounter  Procedures   Urine Culture    Standing Status:   Future    Number of Occurrences:   1    Expected Date:   11/09/2024    Expiration Date:   11/09/2025   Urinalysis, Complete w Microscopic    Standing Status:   Future    Number of Occurrences:   1    Expected Date:   11/09/2024    Expiration Date:   11/09/2025     Hicks Lonn, MD  INTERVAL HISTORY: she returns for surveillance follow-up for vaginal cancer status posttreatment and recurrent hospitalization I reviewed her chart extensively She has been to the emergency room and recent admission to the hospital repeatedly for failure to thrive, urinary incontinence, feeling weak, electrolyte imbalance, poor oral intake, etc. Today, she felt very weak She has eaten very little She has vaginal bleeding on a regular basis We discussed test results and future plan of care extensively including goals of care  PHYSICAL EXAMINATION: ECOG PERFORMANCE STATUS: 3 - Symptomatic, >50% confined to bed  Vitals:   11/09/24 1052 11/09/24 1056  BP: (!) 84/57 110/60  Pulse: 68   Resp: 17   Temp: 97.6 F (36.4 C)   SpO2: 99%    Filed Weights   11/09/24 1052  Weight: 170 lb (77.1 kg)    Relevant data reviewed  during this visit included CBC, CMP, iron studies, recent CT imaging from November 2025

## 2024-11-09 NOTE — Assessment & Plan Note (Addendum)
 She has acute renal failure today could be due to recent hydronephrosis and dehydration I will order IV fluid support today, tomorrow and next week

## 2024-11-09 NOTE — Patient Instructions (Signed)
 Fluids Given Through an IV (IV Infusion Therapy): What to Expect IV infusion therapy is a treatment to deliver a fluid, called an infusion, into a vein. You may have IV infusion to get: Fluids. Medicines. Nutrition. Chemotherapy. This is medicines to stop or slow down cancer cells. Blood or blood products. Dye that is given before an MRI or a CT scan. This is called contrast dye. Tell a health care provider about: Any allergies you have. This includes allergies to anesthesia or dyes. All medicines you take. These include vitamins, herbs, eye drops, and creams. Any bleeding problems you have. Any surgeries you've had, including if you've had lymph nodes taken out of your armpit or if you have a arteriovenous fistula for dialysis. Any medical problems you have. Whether you're pregnant or may be pregnant. Whether you've used IV drugs. What are the risks? Your health care provider will talk with you about risks. These may include: Pain, bruising, or bleeding. Infection. The IV leaking or moving out of place. Damage to blood vessels or nerves. Allergic reactions to medicines or dyes. A blood clot. An air bubble in the vein, also called an air embolism. What happens before the procedure? Eat and drink only as you've been told. Ask about changing or stopping: Any medicines you take. Any vitamins, herbs, or supplements you take. What happens during the procedure?     Placing the catheter Your skin at the IV site will be washed with fluid that kills germs. This will help prevent infection. IV infusion therapy starts with a procedure to place a soft tube called a catheter into a vein. An IV tube will be attached to the catheter to let the infusion flow into your blood. Your catheter may be placed: Into a vein that is usually in the bend of the elbow, forearm, or back of the hand. This is called a peripheral IV catheter. This may need to be put into a vein each time you get an  infusion. Into a vein near your elbow. This is called a midline catheter or a peripherally inserted central catheter (PICC). These types of catheters may stay in place for weeks or months at a time so you can receive repeated infusions through it. Into a vein near your neck that leads to your heart. This is called a non-tunneled catheter. This is only used for short amounts of time because it can cause infection. Through the skin of your chest and into a large vein that leads to your heart. This is called a tunneled catheter. This may stay in your body for months or years. Into an implanted port. An implanted port is a device that is surgically inserted under the skin of the chest to provide long-term IV access. The catheter will connect the port to a large vein in the chest or upper arm. A port may be kept in place for many months or years. Each time you have an infusion, a needle will be inserted through your skin to connect the catheter to the port. Doing the infusion To start the infusion, your provider will: Attach the IV tubing to your catheter. Use a tape or a bandage to hold the IV in place against your skin. An IV pump may be used to control the flow of the IV infusion. During the infusion, your provider will check the area to make sure: There is no bleeding, swelling, or pain. Your IV infusion is flowing correctly. After the infusion, your provider will: Take off the bandage  or tape. Disconnect the tubing from the catheter. Remove the catheter, if you have a peripheral IV. Apply pressure over the IV insertion site to stop bleeding, then cover the area with a bandage. If you have an implanted port, PICC, non-tunneled, or tunneled catheter, your catheter may remain in place. This depends on how many times you will need treatment, your medical condition, and what type of catheter you have. These steps may vary. Ask what you can expect. What can I expect after the procedure? You may be  watched closely until you leave. This includes checking your pain level, blood pressure, heart rate, and breathing rate. Your provider will check to make sure there are no signs of infection. Follow these instructions at home: Take your medicines only as told. Change or take off your bandage as told by your provider. Ask what things are safe for you to do at home. Ask when you can go back to work or school. Do not take baths, swim, or use a hot tub until you're told it's OK. Ask if you can shower. Check your IV insertion site every day for signs of infection. Check for: Redness, swelling, or pain. Fluid or blood. If fluid or blood drains from your IV site, use your hands to press down firmly on the area for a minute or two. Doing this should stop the bleeding. Warmth. Pus or a bad smell. Contact a health care provider if: You have signs of infection around your IV site. You have fluid or blood coming from your IV site that does not stop after you put pressure to the site. You have a rash or blisters. You have itchy, red, swollen areas of skin called hives. Get help right away if: You have a fever or chills. You have chest pain. You have trouble breathing. This information is not intended to replace advice given to you by your health care provider. Make sure you discuss any questions you have with your health care provider. Document Revised: 05/11/2023 Document Reviewed: 05/11/2023 Elsevier Patient Education  2024 [employer].

## 2024-11-09 NOTE — Telephone Encounter (Signed)
 Spoke with Samantha at [facility] at request of provider and patient to refer patient for hospice services. Lucie confirmed that they had her information and they would be in touch with family to help coordinate care.

## 2024-11-09 NOTE — H&P (Signed)
 History and Physical    Patient: [name] FMW:[id number] DOB: [birth year] DOA: 11/09/2024 DOS: the patient was seen and examined on 11/09/2024 PCP: [name], MD  Patient coming from: Home  Chief Complaint:  Chief Complaint  Patient presents with   Chest Pain   HPI: [name] is a 88 y.o. female with medical history significant of vaginal carcinoma, currently on chemotherapy diastolic CHF, osteoarthritis, hypothyroidism, essential hypertension, atrial fibrillation on chronic Eliquis , depression, chronic leukemia, osteoporosis who presents to the ER with increasing left lower extremity edema, chest tightness, palpitation and lung fields.  Treatment with chemotherapy was completed in September 2025 where she had chemoradiation therapy with cisplatin.  Patient has had recurrent pneumonia with the chemo.  She was having vaginal bleed today when she went to the cancer center.  Is been going on for the last 2 months.  Patient was seen and evaluated but while there with urinary incontinence, dysuria as well as weakness patient was tachycardic but also hypotensive.  She has been referred to hospice services but has not yet gone there.  She has had chronic hydronephrosis also on hydroureter.  Workup by oncology shows UTI.  Patient was started on Bactrim  but then in the ER she was found to meet sepsis criteria.  Further chest x-ray revealed possible pneumonia.  She is therefore being admitted for further evaluation and treatment  Review of Systems: As mentioned in the history of present illness. All other systems reviewed and are negative. Past Medical History:  Diagnosis Date   Arthritis    CHF (congestive heart failure) (HCC)    Chronic Leukemia    Depression    Dysrhythmia    A.fib   on Eliquis    H/O hiatal hernia    Hernia    Hernia    right inguinal hernia   History of radiation therapy    Vagina- 08/21/24-08/25/24- Dr. Lynwood Nasuti   Hypertension    Hypothyroidism     Leg swelling    Osteitis pubis 05/30/2001   Osteopenia 03/30/2009   Osteopenia 07/30/2022   Bone density 2021 showed elevated FRAX patient was started on Fosamax    Pneumonia    Thyroid  disease    Past Surgical History:  Procedure Laterality Date   ABDOMINAL HYSTERECTOMY  03/1970   APPENDECTOMY  03/1966   BILATERAL SALPINGOOPHORECTOMY     CARDIOVERSION N/A 03/10/2022   Procedure: CARDIOVERSION;  Surgeon: Francyne Headland, MD;  Location: [facility];  Service: Cardiovascular;  Laterality: N/A;   CATARACT EXTRACTION W/PHACO Right 09/25/2014   Procedure: CATARACT EXTRACTION PHACO AND INTRAOCULAR LENS PLACEMENT (IOC);  Surgeon: Oneil T. Roz, MD;  Location: [facility];  Service: Ophthalmology;  Laterality: Right;  CDE:7.56   CATARACT EXTRACTION W/PHACO Left 10/09/2014   Procedure: CATARACT EXTRACTION PHACO AND INTRAOCULAR LENS PLACEMENT LEFT EYE CDE=6.51;  Surgeon: Oneil T. Roz, MD;  Location: [facility];  Service: Ophthalmology;  Laterality: Left;   CHOLECYSTECTOMY  1980   COLONOSCOPY  1999   COLONOSCOPY  March 2007   Repeat 10 years   COLONOSCOPY  11/06/2003    Internal and external hemorrhoids/Two tiny rectal distal sigmoid polyps cold biopsied/Otherwise within normal limits to the cecum   COLONOSCOPY N/A 08/29/2014   Dr. Shaaron: Sessile polyp was found at the cecum status post cold snare polypectomy, tubular adenoma. No further surveillance unless clinical status changes   COLONOSCOPY N/A 09/19/2024   Procedure: COLONOSCOPY;  Surgeon: Eartha Angelia Sieving, MD;  Location: [facility];  Service: Gastroenterology;  Laterality:  N/A;   CYSTOSCOPY N/A 05/24/2024   Procedure: CYSTOSCOPY;  Surgeon: Viktoria Comer SAUNDERS, MD;  Location: [facility];  Service: Gynecology;  Laterality: N/A;   ESOPHAGOGASTRODUODENOSCOPY  2007   Dr. Shaaron: normal esophagus, small tom oderate sized hiatal hernia, Cameron lesions   ESOPHAGOGASTRODUODENOSCOPY N/A 09/19/2024   Procedure: EGD (ESOPHAGOGASTRODUODENOSCOPY);   Surgeon: Eartha Flavors, Toribio, MD;  Location: [facility];  Service: Gastroenterology;  Laterality: N/A;   EXAM UNDER ANESTHESIA, PELVIC N/A 05/24/2024   Procedure: EXAM UNDER ANESTHESIA, PELVIC;  Surgeon: Viktoria Comer SAUNDERS, MD;  Location: [facility];  Service: Gynecology;  Laterality: N/A;   IR IMAGING GUIDED PORT INSERTION  05/23/2024   SKIN CANCER EXCISION Left 11 1 2016   left arm squamous cancer   TUMOR REMOVAL  03/1968   ovarian   Social History:  reports that she has never smoked. She has never been exposed to tobacco smoke. She has never used smokeless tobacco. She reports current alcohol use. She reports that she does not use drugs.  Allergies[1]  Family History  Problem Relation Age of Onset   Dementia Mother    Heart disease Father    Cancer Father        throat   Cancer Sister        breast   Breast cancer Sister    Lung cancer Grandson    Colon cancer Neg Hx    Lung disease Neg Hx    Autoimmune disease Neg Hx     Prior to Admission medications  Medication Sig Start Date End Date Taking? Authorizing Provider  albuterol  (VENTOLIN  HFA) 108 (90 Base) MCG/ACT inhaler INHALE 2 PUFFS INTO THE LUNGS EVERY 6 HOURS AS NEEDED FOR WHEEZING OR SHORTNESS OF BREATH 05/29/24  Yes Luking, Scott A, MD  ALPRAZolam  (XANAX ) 0.5 MG tablet TAKE 1 TABLET BY MOUTH EVERY NIGHT AT BEDTIME. MAY TAKE 1/2 TABLET DURING THE DAY AS NEEDED Patient taking differently: Take 0.25-0.5 mg by mouth See admin instructions. Take 0.5 mg by mouth at bedtime and an additional 0.25 mg once a day as needed for anxiety 09/14/24  Yes Luking, Glendia LABOR, MD  Cholecalciferol (VITAMIN D3) 2000 UNITS TABS Take 2,000 Units by mouth daily.   Yes [provider]  ELIQUIS  5 MG TABS tablet TAKE 1 TABLET(5 MG) BY MOUTH TWICE DAILY. FOLLOW-UP Patient taking differently: Take 5 mg by mouth in the morning and at bedtime. 10/06/24  Yes Fountain, Madison L, NP  levothyroxine  (SYNTHROID ) 125 MCG tablet TAKE 1 TABLET BY MOUTH  DAILY Patient taking differently: Take 125 mcg by mouth daily before breakfast. 08/25/24  Yes Luking, Glendia LABOR, MD  lidocaine -prilocaine  (EMLA ) cream Apply 1 Application topically as needed (for port access).   Yes [provider]  metoprolol  succinate (TOPROL -XL) 25 MG 24 hr tablet Take 1 tablet (25 mg total) by mouth daily. 07/13/24  Yes Mauro Elveria BROCKS, NP  Multiple Vitamin (MULTI-VITAMIN DAILY PO) Take 1 tablet by mouth daily with breakfast.   Yes [provider]  ondansetron  (ZOFRAN -ODT) 4 MG disintegrating tablet Take 1 tablet (4 mg total) by mouth every 8 (eight) hours as needed. Patient taking differently: Take 4 mg by mouth every 8 (eight) hours as needed for nausea or vomiting (DISSOLVE ORALLY). 10/13/24  Yes Francesca Elsie CROME, MD  pantoprazole  (PROTONIX ) 40 MG tablet TAKE 1 TABLET(40 MG) BY MOUTH DAILY Patient taking differently: Take 40 mg by mouth daily before breakfast. 07/24/24  Yes Cook, Jayce G, DO  potassium chloride  SA (KLOR-CON  M)  20 MEQ tablet 1 bid Patient taking differently: Take 20 mEq by mouth daily. 09/27/24  Yes [name], MD  pregabalin  (LYRICA ) 25 MG capsule Take 50 mg by mouth in the morning. 08/24/24  Yes [provider]  SYSTANE 0.4-0.3 % SOLN Place 1 drop into both eyes 3 (three) times daily as needed (FOR IRRITATION).   Yes [provider]  torsemide  (DEMADEX ) 20 MG tablet Take 1 tablet (20 mg total) by mouth daily. 09/27/24  Yes [name], MD  TYLENOL  500 MG tablet Take 1,000 mg by mouth every 8 (eight) hours as needed for mild pain (pain score 1-3).   Yes [provider]  diphenoxylate -atropine  (LOMOTIL ) 2.5-0.025 MG tablet Take 1 tablet by mouth 4 (four) times daily as needed for diarrhea or loose stools.    [provider]  indapamide  (LOZOL ) 1.25 MG tablet Take 1.25 mg by mouth daily. 10/18/24   [provider]  magnesium  gluconate (MAGONATE) 500 (27 Mg) MG TABS tablet Take 1 tablet (500  mg total) by mouth daily at 6 (six) AM. Patient not taking: Reported on 11/09/2024 08/28/24   [name], MD  mirtazapine  (REMERON ) 15 MG tablet Take 1 tablet (15 mg total) by mouth at bedtime. 11/01/24 12/01/24  [name], MD  polyethylene glycol (MIRALAX ) 17 g packet Take 17 g by mouth daily. Patient not taking: Reported on 11/09/2024 09/20/24   Darci Pore, MD  sertraline  (ZOLOFT ) 100 MG tablet TAKE 1 TABLET BY MOUTH EVERY DAY Patient not taking: Reported on 11/09/2024 02/21/24   [name], MD    Physical Exam: Vitals:   11/09/24 1552 11/09/24 1700 11/09/24 1815 11/09/24 1858  BP: 110/89 114/67 134/69   Pulse: (!) 157 87 91   Resp: 18 20 20    Temp: (!) 97.5 F (36.4 C)   98.7 F (37.1 C)  TempSrc: Oral   Oral  SpO2: 94% 94% 96%    Constitutional: NAD, calm, comfortable Eyes: PERRL, lids and conjunctivae normal ENMT: Mucous membranes are dry. Posterior pharynx clear of any exudate or lesions.Normal dentition.  Neck: normal, supple, no masses, no thyromegaly Respiratory: clear to auscultation bilaterally, no wheezing, no crackles. Normal respiratory effort. No accessory muscle use.  Cardiovascular: Irregularly irregular with tachycardia, no murmurs / rubs / gallops. No extremity edema. 2+ pedal pulses. No carotid bruits.  Abdomen: no tenderness, no masses palpated. No hepatosplenomegaly. Bowel sounds positive.  Musculoskeletal: Good range of motion, no joint swelling or tenderness, Skin: no rashes, lesions, ulcers. No induration Neurologic: CN 2-12 grossly intact. Sensation intact, DTR normal. Strength 5/5 in all 4.  Psychiatric: Normal judgment and insight. Alert and oriented x 3. Normal mood  Data Reviewed:  Temperature 97.5, blood pressure 84/57, white count 14.9 hemoglobin 8.2 and platelets 457, sodium 132, creatinine 1.53 chloride 97 calcium  8.3, glucose 189, proBNP is 3009 initial troponin 38 and 41 acute viral screen is negative for COVID, flu  RSV urinalysis showed turbid urine with positive nitrite and leukocytes.  Chest x-ray showed no active disease bilateral Doppler ultrasound lower extremity showed no evidence of DVT  Assessment and Plan:  #1 sepsis due to UTI: Patient is significantly immunocompromised.  Just completed chemo and radiation in September.  She meets sepsis criteria with tachycardia and white count.  Also evidence of UTI.  At this point we will admit the patient.  Initiate IV Rocephin  and follow the sepsis protocol.  Follow lactic acid.  #2 acute UTI: Continue antibiotics as above.  Urine culture and blood cultures.  Continue to monitor  #3 anemia: Probably due to acute blood loss.  Patient is having menorrhagia.  Will continue to monitor H&H.  No transfusion at this point.  #4 chronic diastolic CHF: Monitor on telemetry.  #5 chronic atrial fibrillation with RVR: Resume home regimen.  IV Lopressor  as needed for Cardizem  #6 GERD: Continue PPIs  #7 hyperlipidemia: Continue statin  #8 acquired hypothyroidism: Continue levothyroxine   #9 essential hypertension: Blood pressure is controlled.  Continue home regimen  #10 idiopathic pulmonary fibrosis: Stable.  Continue home regimen  #11 stage IV vaginal cancer: Status post chemoradiation.  Continue with oncology follow-up    Advance Care Planning:   Code Status: Limited: Do not attempt resuscitation (DNR) -DNR-LIMITED -Do Not Intubate/DNI    Consults: None  Family Communication: No family at bedside  Severity of Illness: The appropriate patient status for this patient is INPATIENT. Inpatient status is judged to be reasonable and necessary in order to provide the required intensity of service to ensure the patient's safety. The patient's presenting symptoms, physical exam findings, and initial radiographic and laboratory data in the context of their chronic comorbidities is felt to place them at high risk for further clinical deterioration. Furthermore, it is  not anticipated that the patient will be medically stable for discharge from the hospital within 2 midnights of admission.   * I certify that at the point of admission it is my clinical judgment that the patient will require inpatient hospital care spanning beyond 2 midnights from the point of admission due to high intensity of service, high risk for further deterioration and high frequency of surveillance required.*  AuthorBETHA SIM KNOLL, MD 11/09/2024 8:58 PM  For on call review [email or web address].      [1]  Allergies Allergen Reactions   Aspirin Other (See Comments)    Caused internal bleeding. Patient had ulcer at the time. Instructed by PCP - Nancey not to take any longer.   Erythromycin Shortness Of Breath   Ibuprofen Other (See Comments)    Instructed not to take by PCP - Nancey due to reaction to aspirin as well (precautionary).

## 2024-11-09 NOTE — Progress Notes (Signed)
 Gynecologic Oncology Symptom Management Visit  Reason for Visit: Evaluation of vaginal spotting  Date: 11/09/24  Treatment History: Oncology History Overview Note  CARIS: p53 mutated, MSI stable, low TMB, PD-L1 0%, Her2/neu neg 0 score, BRAF neg   Vaginal cancer (HCC)  03/29/2024 Initial Diagnosis   She presented to see her primary care doctor due to postmenopausal bleeding as well as symptoms of dysuria, frequency, urgency and pelvic pressure associated with urinary incontinence.  She was subsequently seen by gynecologist who performed examination and biopsy which revealed diagnosis of vaginal cancer   04/05/2024 Pathology Results   SURGICAL PATHOLOGY  CASE: [id number]  PATIENT: [name]  Surgical Pathology Report   Clinical History: PMB (cm)   FINAL MICROSCOPIC DIAGNOSIS:   A. VAGINAL CUFF, APEX, BIOPSY:  Invasive moderate to poorly differentiated squamous cell carcinoma, focally keratinizing  Tumor present in all margins    04/20/2024 Imaging   NM PET Image Initial (PI) Whole Body (F-18 FDG) Result Date: 04/26/2024 CLINICAL DATA:  Initial treatment strategy for vaginal cancer. EXAM: NUCLEAR MEDICINE PET WHOLE BODY TECHNIQUE: 9.4 mCi F-18 FDG was injected intravenously. Full-ring PET imaging was performed from the head to foot after the radiotracer. CT data was obtained and used for attenuation correction and anatomic localization. Fasting blood glucose: 105 mg/dl COMPARISON:  Pelvic MRI 04/20/2024 FINDINGS: Mediastinal blood pool activity: SUV max 3.0 HEAD/NECK: No significant abnormal hypermetabolic activity in this region. Incidental CT findings: none CHEST: No significant abnormal hypermetabolic activity in this region. Incidental CT findings: Aortic and branch vessel atherosclerosis. Moderate cardiomegaly. Mild nonspecific peripheral interstitial accentuation in the lungs, cannot exclude mild interstitial edema. ABDOMEN/PELVIS: Posterior to the urinary bladder along the upper  third of the vaginal cuff, a hypermetabolic lesion has maximum SUV of 25.5, compatible with malignancy. A right external iliac node measuring 0.8 cm in short axis on image 184 series 4 has maximum SUV of 6.6, compatible with malignant involvement. Along the anterior margin of an otherwise simple right mid to lower kidney cyst, a 1.2 by 2.3 cm higher density component is present on image 137 of series 4. This could simply reflect blood products of a complex cyst, but I can confidently exclude low-grade metabolic activity. Dedicated renal protocol MRI with and without contrast is recommended for definitive characterization. Incidental CT findings: Hiatal hernia with adjacent postoperative findings under is metal clips in the left upper quadrant. Atherosclerosis is present, including aortoiliac atherosclerotic disease. Right inguinal hernia contains adipose tissue and a loop of small bowel without findings of strangulation or obstruction. SKELETON: No significant abnormal hypermetabolic activity in this region. Incidental CT findings: none EXTREMITIES: No significant abnormal hypermetabolic activity in this region. Incidental CT findings: Incidental fatty components of the medial head gastrocnemius muscle on the right. IMPRESSION: 1. Hypermetabolic lesion along the upper third of the vaginal cuff posterior to the urinary bladder, compatible with malignancy. 2. A 0.8 cm right external iliac node has maximum SUV of 6.6, compatible with malignant involvement. 3. Along the anterior margin of an otherwise simple right mid to lower kidney cyst, a 1.2 by 2.3 cm higher density component is present. This could simply reflect blood products of a complex cyst, but I can confidently exclude low-grade metabolic activity. Dedicated renal protocol MRI with and without contrast is recommended for definitive characterization, to exclude synchronous renal mass. 4. Right inguinal hernia contains adipose tissue and a loop of small bowel  without findings of strangulation or obstruction. 5. Hiatal hernia with adjacent postoperative findings under is metal  clips in the left upper quadrant. 6. Moderate cardiomegaly. Mild nonspecific peripheral interstitial accentuation in the lungs, cannot exclude mild interstitial edema. 7.  Aortic Atherosclerosis (ICD10-I70.0). Electronically Signed   By: Ryan Salvage M.D.   On: 04/26/2024 21:03   MR PELVIS W WO CONTRAST Result Date: 04/26/2024 CLINICAL DATA:  Vaginal carcinoma. EXAM: MRI PELVIS WITHOUT AND WITH CONTRAST TECHNIQUE: Multiplanar multisequence MR imaging of the pelvis was performed both before and after administration of intravenous contrast. CONTRAST:  9mL GADAVIST  GADOBUTROL  1 MMOL/ML IV SOLN COMPARISON:  None Available. FINDINGS: Lower Urinary Tract: Mild irregular wall thickening is seen involving the bladder base, which is contiguous with the vaginal soft tissue mass described below, suspicious for direct bladder invasion. Bowel: Unremarkable pelvic bowel loops. Vascular/Lymphatic: 9 mm right external iliac lymph node is seen on image 16/3, which is borderline enlarged. No pathologically enlarged lymph nodes identified. Reproductive: -- Uterus: Prior hysterectomy. Concentric masslike wall thickening is seen involving the upper portion of the vaginal cuff, measuring approximately 3.8 x 2.6 x 3.5 cm. Irregular contour is seen with the adjacent parametrial fat bilaterally, consistent with parametrial invasion. No evidence of distal ureteral involvement or hydroureteronephrosis. -- Right ovary: Not visualized, however no adnexal mass identified. -- Left ovary:  Not visualized, however no adnexal mass identified. Other: No peritoneal thickening or abnormal free fluid. Moderate right inguinal hernia is seen which contains small bowel. No evidence of bowel obstruction or strangulation. Musculoskeletal:  Unremarkable. IMPRESSION: Concentric mass involving the upper portion of the vaginal cuff,  with evidence of bilateral parametrial invasion. Mild irregular bladder wall thickening involving the bladder base, which is contiguous with the vaginal soft tissue mass, highly suspicious for direct bladder invasion. Borderline enlarged 9 mm right external iliac lymph node. Consider PET-CT scan for further evaluation. Moderate right inguinal hernia containing small bowel. No evidence of bowel obstruction or strangulation. Electronically Signed   By: Norleen DELENA Kil M.D.   On: 04/26/2024 16:08      04/28/2024 Initial Diagnosis   Vaginal cancer (HCC)   04/28/2024 Cancer Staging   Staging form: Vagina, AJCC 8th Edition - Clinical stage from 04/28/2024: FIGO Stage IVA (cT4, cN1, cM0) - Signed by Lonn Hicks, MD on 04/28/2024 Stage prefix: Initial diagnosis   05/19/2024 - 06/30/2024 Chemotherapy   Patient is on Treatment Plan : Vaginal cancer Carboplatin  (AUC 2) q7d + XRT     05/24/2024 Procedure   Placement of a subcutaneous power-injectable port device. Catheter tip at the superior cavoatrial junction.     Interval History: Patient seen today in the office for evaluation of vaginal bleeding per the request of Dr. Lonn.  The patient reports having a decreased appetite.  She did have an episode of emesis with mucus earlier this morning. She has had a hiatal hernia for 25 yrs at least. She tries to stay hydrated but has noted she has not been eating much.  She does have intermittent abdominal pain and groin pain on the right side that improved with tylenol .  She is constantly dribbling urine and wakes up with pads soaked.  Took azo recently. She had SOB this am related to anxiety about coming to visit. Her stomach has been hurting for past 3 days. She has also had light vaginal spotting.  Bowels are functioning and are loose.  For the past week, she has noticed an increase in left lower extremity edema. Patient also reports weight gain with being 140 lbs 2 months ago and now at 170.  At her  visit earlier  this morning with Dr. Lonn, she was told to begin breaking her blood thinner in half twice daily due to the vaginal bleeding.  Discussed CT scan results.  Discussed Dr. Lewie recommendations for PCN tubes.  Dr. Lonn also planning on giving IV fluid infusion today. At her visit today with her grand-daughter. Patient reports spending large amount of time in bed when at home.   Past Medical/Surgical History: Past Medical History:  Diagnosis Date   Arthritis    CHF (congestive heart failure) (HCC)    Chronic Leukemia    Depression    Dysrhythmia    A.fib   on Eliquis    H/O hiatal hernia    Hernia    Hernia    right inguinal hernia   History of radiation therapy    Vagina- 08/21/24-08/25/24- Dr. Lynwood Nasuti   Hypertension    Hypothyroidism    Leg swelling    Osteitis pubis 05/30/2001   Osteopenia 03/30/2009   Osteopenia 07/30/2022   Bone density 2021 showed elevated FRAX patient was started on Fosamax    Pneumonia    Thyroid  disease     Past Surgical History:  Procedure Laterality Date   ABDOMINAL HYSTERECTOMY  03/1970   APPENDECTOMY  03/1966   BILATERAL SALPINGOOPHORECTOMY     CARDIOVERSION N/A 03/10/2022   Procedure: CARDIOVERSION;  Surgeon: Francyne Headland, MD;  Location: [facility];  Service: Cardiovascular;  Laterality: N/A;   CATARACT EXTRACTION W/PHACO Right 09/25/2014   Procedure: CATARACT EXTRACTION PHACO AND INTRAOCULAR LENS PLACEMENT (IOC);  Surgeon: Oneil T. Roz, MD;  Location: [facility];  Service: Ophthalmology;  Laterality: Right;  CDE:7.56   CATARACT EXTRACTION W/PHACO Left 10/09/2014   Procedure: CATARACT EXTRACTION PHACO AND INTRAOCULAR LENS PLACEMENT LEFT EYE CDE=6.51;  Surgeon: Oneil T. Roz, MD;  Location: [facility];  Service: Ophthalmology;  Laterality: Left;   CHOLECYSTECTOMY  1980   COLONOSCOPY  1999   COLONOSCOPY  March 2007   Repeat 10 years   COLONOSCOPY  11/06/2003    Internal and external hemorrhoids/Two tiny rectal distal sigmoid polyps cold  biopsied/Otherwise within normal limits to the cecum   COLONOSCOPY N/A 08/29/2014   Dr. Shaaron: Sessile polyp was found at the cecum status post cold snare polypectomy, tubular adenoma. No further surveillance unless clinical status changes   COLONOSCOPY N/A 09/19/2024   Procedure: COLONOSCOPY;  Surgeon: Eartha Angelia Sieving, MD;  Location: [facility];  Service: Gastroenterology;  Laterality: N/A;   CYSTOSCOPY N/A 05/24/2024   Procedure: CYSTOSCOPY;  Surgeon: Viktoria Comer SAUNDERS, MD;  Location: [facility];  Service: Gynecology;  Laterality: N/A;   ESOPHAGOGASTRODUODENOSCOPY  2007   Dr. Shaaron: normal esophagus, small tom oderate sized hiatal hernia, Cameron lesions   ESOPHAGOGASTRODUODENOSCOPY N/A 09/19/2024   Procedure: EGD (ESOPHAGOGASTRODUODENOSCOPY);  Surgeon: Eartha Angelia, Sieving, MD;  Location: [facility];  Service: Gastroenterology;  Laterality: N/A;   EXAM UNDER ANESTHESIA, PELVIC N/A 05/24/2024   Procedure: EXAM UNDER ANESTHESIA, PELVIC;  Surgeon: Viktoria Comer SAUNDERS, MD;  Location: [facility];  Service: Gynecology;  Laterality: N/A;   IR IMAGING GUIDED PORT INSERTION  05/23/2024   SKIN CANCER EXCISION Left 11 1 2016   left arm squamous cancer   TUMOR REMOVAL  03/1968   ovarian    Family History  Problem Relation Age of Onset   Dementia Mother    Heart disease Father    Cancer Father        throat   Cancer Sister        breast  Breast cancer Sister    Lung cancer Grandson    Colon cancer Neg Hx    Lung disease Neg Hx    Autoimmune disease Neg Hx     Social History   Socioeconomic History   Marital status: Widowed    Spouse name: Not on file   Number of children: 1   Years of education: 59   Highest education level: Not on file  Occupational History   Occupation: Retired    Associate Professor: RETIRED    Comment: Ship Broker, 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   Widowed since August 02, 2021. Husband W.E. Kolek, died from Kidney Cancer.   Son-Tommy lives nearby.   2 grandson's deceased, Clint and Penne. Penne passed 02/2022 from Brain Cancer.    Social Drivers of Health   Tobacco Use: Low Risk (11/09/2024)   Patient History    Smoking Tobacco Use: Never    Smokeless Tobacco Use: Never    Passive Exposure: Never  Financial Resource Strain: Low Risk (07/07/2024)   Overall Financial Resource Strain (CARDIA)    Difficulty of Paying Living Expenses: Not hard at all  Food Insecurity: No Food Insecurity (11/09/2024)   Epic    Worried About Programme Researcher, Broadcasting/film/video in the Last Year: Never true    Ran Out of Food in the Last Year: Never true  Transportation Needs: No Transportation Needs (11/09/2024)   Epic    Lack of Transportation (Medical): No    Lack of Transportation (Non-Medical): No  Physical Activity: Inactive (07/07/2024)   Exercise Vital Sign    Days of Exercise per Week: 0 days    Minutes of Exercise per Session: Not on file  Stress: No Stress Concern Present (07/07/2024)   [employer] of Occupational Health - Occupational Stress Questionnaire    Feeling of Stress: Only a little  Social Connections: Moderately Integrated (11/09/2024)   Social Connection and Isolation Panel    Frequency of Communication with Friends and Family: Twice a week    Frequency of Social Gatherings with Friends and Family: Twice a week    Attends Religious Services: More than 4 times per year    Active Member of [employer] or Organizations: Yes    Attends Banker Meetings: 1 to 4 times per year    Marital Status:  Widowed  Depression (PHQ2-9): Medium Risk (10/30/2024)   Depression (PHQ2-9)    PHQ-2 Score: 5  Alcohol  Screen: Low Risk (07/07/2024)   Alcohol  Screen    Last Alcohol  Screening Score (AUDIT): 0  Housing: Low Risk (11/09/2024)   Epic    Unable to Pay for Housing in the Last Year: No    Number of Times Moved in the Last Year: 0    Homeless in the Last Year: No  Utilities: Not At Risk (11/09/2024)   Epic    Threatened with loss of utilities: No  Health Literacy: Adequate Health Literacy (07/07/2024)   B1300 Health Literacy  Frequency of need for help with medical instructions: Never    Current Medications: No current facility-administered medications for this visit. No current outpatient medications on file.  Facility-Administered Medications Ordered in Other Visits:    acetaminophen  (TYLENOL ) tablet 650 mg, 650 mg, Oral, Q6H PRN **OR** acetaminophen  (TYLENOL ) suppository 650 mg, 650 mg, Rectal, Q6H PRN, Sim, Mohammad L, MD   acetaminophen  (TYLENOL ) tablet 1,000 mg, 1,000 mg, Oral, Q8H PRN, Sim, Mohammad L, MD   albuterol  (PROVENTIL ) (2.5 MG/3ML) 0.083% nebulizer solution 3 mL, 3 mL, Inhalation, Q6H PRN, Garba, Mohammad L, MD   ALPRAZolam  (XANAX ) tablet 0.25 mg, 0.25 mg, Oral, Daily PRN, Sim Emery CROME, MD   ALPRAZolam  (XANAX ) tablet 0.5 mg, 0.5 mg, Oral, QHS, Garba, Mohammad L, MD, 0.5 mg at 11/09/24 2157   artificial tears ophthalmic solution 1 drop, 1 drop, Both Eyes, TID PRN, Sim, Mohammad L, MD   cefTRIAXone  (ROCEPHIN ) 2 g in sodium chloride  0.9 % 100 mL IVPB, 2 g, Intravenous, Q24H, Sim, Mohammad L, MD, Last Rate: 200 mL/hr at 11/10/24 1047, 2 g at 11/10/24 1047   Chlorhexidine  Gluconate Cloth 2 % PADS 6 each, 6 each, Topical, Daily, Patel, Pranav M, MD, 6 each at 11/10/24 1430   diphenoxylate -atropine  (LOMOTIL ) 2.5-0.025 MG per tablet 1 tablet, 1 tablet, Oral, QID PRN, Sim Emery CROME, MD   hydrALAZINE  (APRESOLINE ) injection 10 mg, 10 mg, Intravenous, Q4H PRN, Patel,  Pranav M, MD   levothyroxine  (SYNTHROID ) tablet 125 mcg, 125 mcg, Oral, Q0600, Sim Emery CROME, MD, 125 mcg at 11/10/24 9378   lidocaine -prilocaine  (EMLA ) cream 1 Application, 1 Application, Topical, PRN, Sim Emery CROME, MD   magnesium  gluconate (MAGONATE) tablet 500 mg, 500 mg, Oral, Q0600, Sim Emery CROME, MD, 500 mg at 11/10/24 9378   metoprolol  succinate (TOPROL -XL) 24 hr tablet 25 mg, 25 mg, Oral, Daily, Sim, Mohammad L, MD, 25 mg at 11/10/24 1040   mirtazapine  (REMERON ) tablet 15 mg, 15 mg, Oral, QHS, Garba, Mohammad L, MD, 15 mg at 11/09/24 2157   [START ON 11/11/2024] multivitamin liquid 15 mL, 15 mL, Oral, Daily, Tobie, Pranav M, MD   ondansetron  (ZOFRAN ) tablet 4 mg, 4 mg, Oral, Q6H PRN **OR** ondansetron  (ZOFRAN ) injection 4 mg, 4 mg, Intravenous, Q6H PRN, Sim, Mohammad L, MD   ondansetron  (ZOFRAN -ODT) disintegrating tablet 4 mg, 4 mg, Oral, Q8H PRN, Sim, Mohammad L, MD   pantoprazole  (PROTONIX ) EC tablet 40 mg, 40 mg, Oral, QAC breakfast, Sim, Mohammad L, MD, 40 mg at 11/10/24 9187   polyethylene glycol (MIRALAX  / GLYCOLAX ) packet 17 g, 17 g, Oral, Daily, Sim, Mohammad L, MD, 17 g at 11/10/24 1048   [START ON 11/11/2024] pregabalin  (LYRICA ) capsule 50 mg, 50 mg, Oral, QHS, Patel, Pranav M, MD   sodium chloride  flush (NS) 0.9 % injection 10-40 mL, 10-40 mL, Intracatheter, Q12H, Tobie Yetta HERO, MD, 10 mL at 11/10/24 1048   sodium chloride  flush (NS) 0.9 % injection 10-40 mL, 10-40 mL, Intracatheter, PRN, Tobie Yetta HERO, MD   torsemide  (DEMADEX ) tablet 20 mg, 20 mg, Oral, Daily, Tobie Yetta HERO, MD  Review of Symptoms: Pertinent positives as per HPI/Interval.  Physical Exam: Today's Vitals   11/09/24 1136  BP: 110/60  Pulse: 68  Resp: 17  Temp: 97.6 F (36.4 C)  TempSrc: Tympanic  SpO2: 99%  Weight: 170 lb (77.1 kg)  Height: 5' 5.5 (1.664 m)  PainSc: 0-No pain   Body mass index is 27.86 kg/m.  General: Well developed, well nourished female in no acute  distress. Alert and oriented x 3.  Neck: Supple without any enlargements.  Lymph node survey: No cervical, supraclavicular, or inguinal adenopathy.  Cardiovascular: Regular rate and rhythm. S1 and S2 normal.  Lungs: Clear to auscultation bilaterally. No wheezes/crackles/rhonchi noted.  Skin: No rashes or lesions present. Back: No CVA tenderness.  Abdomen: Abdomen soft, non-tender and obese. Active bowel sounds in all quadrants. No evidence of a fluid wave. Genitourinary:    Vulva/vagina: Normal external female genitalia. No lesions.    Urethra: No lesions or masses    On exam today, no bleeding noted but there was urine in the vagina. There was necrotic appearing tissue at the top of the vagina, no tissue that looked viable for biopsy. A definitive vesicovaginal fistula not appreciated by Dr. Viktoria on exam, but high suspicion given her reported symptoms.  Extremities: Significant edema of the left lower extremity  Laboratory & Radiologic Studies: From 11/09/24. Creatinine at 1.63  Assessment & Plan: [name] is a 88 y.o. 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 See addendum from Dr. Viktoria. Discussed CT scan findings. Dr. Lonn notified of left LLE edema with no doppler advised given the decreasing of the dosage of blood thinner due to bleeding.   Addendum from Dr. Viktoria: Addendum:   Ms. Beske seen by Eleanor in our office after Dr. Lonn reached out regarding vaginal bleeding.  Upon talking to the patient, she has had minimal spotting.  She has almost continuous leakage of urine.  On our exam today, no bleeding noted but there was urine in the vagina.  There was necrotic appearing tissue at the top of the vagina, no tissue that looked viable for biopsy.  I could not appreciate a definitive vesicovaginal fistula, but I am suspicious of this given her reported symptoms.   I spent quite some time speaking with the patient and her granddaughter who accompanied her to the  visit today.  We discussed her overall health status and performance status since completing treatment.  She is spending almost all of her time at home and much of it in bed.  She has not been able to take good p.o. intake.   I reviewed most recent CT scans which shows a persistent mass at the vaginal apex with worsening hydronephrosis.  I am concerned that whether this is related to progression of disease or posttreatment effect, the backup of her urinary system is likely putting her at risk for infection and contributing to her worsened kidney function.  Given the location of this mass and if it is causing distal obstruction as well as the possibility of a vesicovaginal fistula, I think stent placement would be quite challenging and at high risk of failure.  Additionally, if the patient has a vesicovaginal fistula, stent placement will not change her urinary leakage, which has been quite bothersome to her.  I brought up the possibility of bilateral PCNs, both to help alleviate her hydronephrosis and save kidney function but also to decrease/prevent her urinary leakage.  I reached out to one of the urologists who reviewed her imaging and agrees that urinary diversion with PCNs would be best if diversion undertaken.   In the broader scope of her disease and treatment, we discussed that she is not a candidate for systemic therapy now given her performance status and overall health status.  I brought up decisions about whether to proceed with interventions that may allow her to get stronger versus focusing on quality of life and  taking a more palliative approach.  She is not sure what she would like to do and would like some time to talk to her granddaughter and family about this.  Although PCN placement would require a procedure and has some risk (we reviewed this risk today), I think this could be pursued even if we are opting for more of a palliative approach to help with her urinary incontinence.   Comer Dollar MD Gynecologic Oncology    30 minutes of total time was spent for this patient encounter, including preparation, counseling with the patient and coordination of care, and documentation of the encounter.  Eleanor Epps NP [facility]

## 2024-11-09 NOTE — Progress Notes (Signed)
 Bilateral lower extremity venous duplex has been completed. Preliminary results can be found in CV Proc through chart review.  Results were given to Dr. Bari.  11/09/2024 4:38 PM Cathlyn Collet RVT

## 2024-11-09 NOTE — Patient Instructions (Signed)
 Dr. Viktoria is going to reach out a urologist to get their opinion on management of the urinary issues.   Plan on having fluids today per Dr. Lonn.   Dr. Lonn is recommending holding off on a doppler of your leg at this time. Please contact the office if you have any changes.   Dr. Viktoria is recommending PCN tubes. Please think about this and our office will reach out to check in and answer any questions.

## 2024-11-09 NOTE — ED Provider Notes (Signed)
 [place] EMERGENCY DEPARTMENT AT [facility] Provider Note   CSN: [id number] Arrival date & time: 11/09/24  1547     Patient presents with: Chest Pain   [name] is a 88 y.o. female.   HPI   88 year old female with past medical history of stage IV vaginal cancer, atrial fibrillation on rate control and anticoagulation, chronic hydroureter/hydronephrosis and urinary incontinence secondary to vaginal mass presents emergency department from office for concern of chest tightness and tachycardia.  Was seen by oncology today, Dr. Lonn had labs and a urinalysis done that showed a UTI, urine culture sent.  Patient started on her first oral dose of Bactrim .  And was also given fluids to report.  While leaving the office patient became uncomfortable and was noted to be tachycardic and referred here.  Patient states has been compliant with all of her medications including rate control/anticoagulation.  Denies any fever but has had decreased appetite.  Admits to ongoing urinary incontinence and now with 1 day of nonbloody diarrhea.  No documented fever/chills.  No vomiting.  Prior to Admission medications  Medication Sig Start Date End Date Taking? Authorizing Provider  acetaminophen  (TYLENOL ) 650 MG CR tablet Take 650 mg by mouth every 8 (eight) hours as needed for pain.    [provider]  albuterol  (VENTOLIN  HFA) 108 (90 Base) MCG/ACT inhaler INHALE 2 PUFFS INTO THE LUNGS EVERY 6 HOURS AS NEEDED FOR WHEEZING OR SHORTNESS OF BREATH 05/29/24   Luking, Glendia LABOR, MD  ALPRAZolam  (XANAX ) 0.5 MG tablet TAKE 1 TABLET BY MOUTH EVERY NIGHT AT BEDTIME. MAY TAKE 1/2 TABLET DURING THE DAY AS NEEDED Patient taking differently: Take 0.5 mg by mouth 2 (two) times daily as needed for anxiety. TAKE 1 TABLET BY MOUTH EVERY NIGHT AT BEDTIME. MAY TAKE 1/2 TABLET DURING THE DAY AS NEEDED 09/14/24   [name], MD  Cholecalciferol (VITAMIN D3) 2000 UNITS TABS Take 2,000 Units by mouth  daily.    [provider]  ELIQUIS  5 MG TABS tablet TAKE 1 TABLET(5 MG) BY MOUTH TWICE DAILY. FOLLOW-UP 10/06/24   Rana Lum CROME, NP  indapamide  (LOZOL ) 1.25 MG tablet Take 1.25 mg by mouth daily. 10/18/24   [provider]  levothyroxine  (SYNTHROID ) 125 MCG tablet TAKE 1 TABLET BY MOUTH DAILY 08/25/24   [name], MD  magnesium  gluconate (MAGONATE) 500 (27 Mg) MG TABS tablet Take 1 tablet (500 mg total) by mouth daily at 6 (six) AM. Patient taking differently: Take 500 mg by mouth daily at 6 (six) AM. As needed 08/28/24   [name], MD  metoprolol  succinate (TOPROL -XL) 25 MG 24 hr tablet Take 1 tablet (25 mg total) by mouth daily. 07/13/24   Mauro Elveria BROCKS, NP  mirtazapine  (REMERON ) 15 MG tablet Take 1 tablet (15 mg total) by mouth at bedtime. 11/01/24 12/01/24  [name], MD  Multiple Vitamin (MULTI-VITAMIN DAILY PO) Take 1 tablet by mouth daily.    [provider]  ondansetron  (ZOFRAN -ODT) 4 MG disintegrating tablet Take 1 tablet (4 mg total) by mouth every 8 (eight) hours as needed. 10/13/24   Francesca Elsie CROME, MD  pantoprazole  (PROTONIX ) 40 MG tablet TAKE 1 TABLET(40 MG) BY MOUTH DAILY 07/24/24   Cook, Jayce G, DO  polyethylene glycol (MIRALAX ) 17 g packet Take 17 g by mouth daily. 09/20/24   Darci Pore, MD  potassium chloride  SA (KLOR-CON  M) 20 MEQ tablet 1 bid 09/27/24   [name], MD  pregabalin  (  LYRICA ) 25 MG capsule Take 25 mg by mouth 2 (two) times daily. 08/24/24   [provider]  sertraline  (ZOLOFT ) 100 MG tablet TAKE 1 TABLET BY MOUTH EVERY DAY 02/21/24   [name], MD  torsemide  (DEMADEX ) 20 MG tablet Take 1 tablet (20 mg total) by mouth daily. 09/27/24   [name], MD    Allergies: Erythromycin, Aspirin, and Ibuprofen    Review of Systems  Constitutional:  Positive for fatigue. Negative for fever.  Respiratory:  Positive for chest tightness and shortness of breath.   Cardiovascular:   Positive for palpitations and leg swelling. Negative for chest pain.  Gastrointestinal:  Positive for diarrhea. Negative for abdominal pain and vomiting.  Genitourinary:  Positive for dysuria, frequency and urgency.       Urinary incontinence   Musculoskeletal:  Negative for back pain.  Skin:  Negative for rash.  Neurological:  Negative for headaches.    Updated Vital Signs BP 110/89 (BP Location: Right Arm)   Pulse (!) 157   Temp (!) 97.5 F (36.4 C) (Oral)   Resp 18   SpO2 94%   Physical Exam Vitals and nursing note reviewed.  Constitutional:      General: She is not in acute distress.    Appearance: Normal appearance.  HENT:     Head: Normocephalic.     Mouth/Throat:     Mouth: Mucous membranes are moist.  Eyes:     Extraocular Movements: Extraocular movements intact.  Cardiovascular:     Rate and Rhythm: Tachycardia present. Rhythm irregular.  Pulmonary:     Effort: Pulmonary effort is normal. No respiratory distress.     Breath sounds: Examination of the right-lower field reveals decreased breath sounds. Examination of the left-lower field reveals decreased breath sounds. Decreased breath sounds present.  Chest:     Chest wall: No tenderness or crepitus.  Abdominal:     Palpations: Abdomen is soft.     Tenderness: There is no abdominal tenderness. There is no rebound.  Musculoskeletal:     Left lower leg: No tenderness. Edema present.  Skin:    General: Skin is warm.  Neurological:     Mental Status: She is alert and oriented to person, place, and time. Mental status is at baseline.  Psychiatric:        Mood and Affect: Mood normal.     (all labs ordered are listed, but only abnormal results are displayed) Labs Reviewed  CULTURE, BLOOD (ROUTINE X 2)  CULTURE, BLOOD (ROUTINE X 2)  RESP PANEL BY RT-PCR (RSV, FLU A&B, COVID)  RVPGX2  CBC WITH DIFFERENTIAL/PLATELET  COMPREHENSIVE METABOLIC PANEL WITH GFR  LIPASE, BLOOD  URINALYSIS, ROUTINE W REFLEX  MICROSCOPIC  MAGNESIUM   PRO BRAIN NATRIURETIC PEPTIDE  I-STAT CG4 LACTIC ACID, ED  TROPONIN T, HIGH SENSITIVITY    EKG: None  Radiology: No results found.   .Critical Care  Performed by: Bari Roxie HERO, DO Authorized by: Bari Roxie HERO, DO   Critical care provider statement:    Critical care time (minutes):  75   Critical care was necessary to treat or prevent imminent or life-threatening deterioration of the following conditions:  Sepsis and dehydration   Critical care was time spent personally by me on the following activities:  Development of treatment plan with patient or surrogate, discussions with consultants, evaluation of patient's response to treatment, examination of patient, ordering and review of laboratory studies, ordering and review of radiographic studies, ordering and performing treatments and interventions,  pulse oximetry, re-evaluation of patient's condition and review of old charts   I assumed direction of critical care for this patient from another provider in my specialty: no     Care discussed with: admitting provider      Medications Ordered in the ED  sodium chloride  0.9 % bolus 500 mL (has no administration in time range)  metoprolol  tartrate (LOPRESSOR ) injection 5 mg (has no administration in time range)  cefTRIAXone  (ROCEPHIN ) 1 g in sodium chloride  0.9 % 100 mL IVPB (has no administration in time range)                                    Medical Decision Making Amount and/or Complexity of Data Reviewed Labs: ordered. Radiology: ordered.  Risk Prescription drug management. Decision regarding hospitalization.   88 year old female presents emergency department with concern for fast heart rate.  Being seen by oncology for stage IV vaginal cancer, recently completed chemotherapy.  Patient is tachycardic on arrival, complaining of some mild chest heaviness.  She also has unilateral left leg swelling.  States that she is compliant with all  of her medications including anticoagulation.  She is afebrile but reports recent urinary tract infection diagnosis with 1 dose of oral Bactrim .  Continued to have urinary frequency/incontinence.  As well as nonbloody diarrhea.  Blood work shows an increasing leukocytosis, initial lactic is normal.  Given patient's heart failure history small fluid bolus was given with IV metoprolol .  Blood work shows chronic AKI and urinalysis is significant for urinary tract infection.  Previous urine culture had been sent.  Blood cultures sent.  After fluid hydration, dose of IV antibiotics heart rate has normalized.  She is improved appearing.  Troponin and BNP are slightly elevated but around baseline.  Repeat lactic is improving.  Given her oncologic history, age, SIRS/sepsis presentation with history of heart failure we will plan for admission for treatment.  Patients evaluation and results requires admission for further treatment and care.  Spoke with hospitalist, reviewed patient's ED course and they accept admission.  Patient agrees with admission plan, offers no new complaints and is stable/unchanged at time of admit.     Final diagnoses:  None    ED Discharge Orders     None          Bari Roxie HERO, DO 11/09/24 1950

## 2024-11-09 NOTE — Addendum Note (Signed)
 Addended by: MICHELINE SETTER D on: 11/09/2024 12:29 PM   Modules accepted: Orders

## 2024-11-09 NOTE — Assessment & Plan Note (Addendum)
 Overall, she is very frail She had recurrent admissions to the hospital I have long discussion with her granddaughter today about possibility of comfort care with hospice versus active supportive care Ultimately, she is undecided We will schedule IV fluid support for now

## 2024-11-10 ENCOUNTER — Inpatient Hospital Stay

## 2024-11-10 ENCOUNTER — Encounter (HOSPITAL_COMMUNITY): Payer: Self-pay | Admitting: Internal Medicine

## 2024-11-10 LAB — CBC
HCT: 23.6 % — ABNORMAL LOW (ref 36.0–46.0)
HCT: 28.2 % — ABNORMAL LOW (ref 36.0–46.0)
Hemoglobin: 7.4 g/dL — ABNORMAL LOW (ref 12.0–15.0)
Hemoglobin: 9 g/dL — ABNORMAL LOW (ref 12.0–15.0)
MCH: 32.5 pg (ref 26.0–34.0)
MCH: 32.5 pg (ref 26.0–34.0)
MCHC: 31.4 g/dL (ref 30.0–36.0)
MCHC: 31.9 g/dL (ref 30.0–36.0)
MCV: 103.5 fL — ABNORMAL HIGH (ref 80.0–100.0)
MCV: 101.8 fL — ABNORMAL HIGH (ref 80.0–100.0)
Platelets: 447 K/uL — ABNORMAL HIGH (ref 150–400)
Platelets: 390 K/uL (ref 150–400)
RBC: 2.28 MIL/uL — ABNORMAL LOW (ref 3.87–5.11)
RBC: 2.77 MIL/uL — ABNORMAL LOW (ref 3.87–5.11)
RDW: 15 % (ref 11.5–15.5)
RDW: 15.4 % (ref 11.5–15.5)
WBC: 13 K/uL — ABNORMAL HIGH (ref 4.0–10.5)
WBC: 12.6 K/uL — ABNORMAL HIGH (ref 4.0–10.5)
nRBC: 0 % (ref 0.0–0.2)
nRBC: 0 % (ref 0.0–0.2)

## 2024-11-10 LAB — COMPREHENSIVE METABOLIC PANEL WITH GFR
ALT: 7 U/L (ref 0–44)
AST: 16 U/L (ref 15–41)
Albumin: 2.7 g/dL — ABNORMAL LOW (ref 3.5–5.0)
Alkaline Phosphatase: 96 U/L (ref 38–126)
Anion gap: 9 (ref 5–15)
BUN: 19 mg/dL (ref 8–23)
CO2: 24 mmol/L (ref 22–32)
Calcium: 8 mg/dL — ABNORMAL LOW (ref 8.9–10.3)
Chloride: 100 mmol/L (ref 98–111)
Creatinine, Ser: 1.43 mg/dL — ABNORMAL HIGH (ref 0.44–1.00)
GFR, Estimated: 36 mL/min — ABNORMAL LOW (ref >60)
Glucose, Bld: 113 mg/dL — ABNORMAL HIGH (ref 70–99)
Potassium: 4.5 mmol/L (ref 3.5–5.1)
Sodium: 133 mmol/L — ABNORMAL LOW (ref 135–145)
Total Bilirubin: 0.2 mg/dL (ref 0.0–1.2)
Total Protein: 5.9 g/dL — ABNORMAL LOW (ref 6.5–8.1)

## 2024-11-10 LAB — TROPONIN T, HIGH SENSITIVITY: Troponin T High Sensitivity: 51 ng/L — ABNORMAL HIGH (ref 0–19)

## 2024-11-10 LAB — PREPARE RBC (CROSSMATCH)

## 2024-11-10 LAB — URINE CULTURE

## 2024-11-10 LAB — CBC WITH DIFFERENTIAL/PLATELET
Abs Immature Granulocytes: 0.09 K/uL — ABNORMAL HIGH (ref 0.00–0.07)
Basophils Absolute: 0 K/uL (ref 0.0–0.1)
Basophils Relative: 0 %
Eosinophils Absolute: 0.1 K/uL (ref 0.0–0.5)
Eosinophils Relative: 1 %
HCT: 21.7 % — ABNORMAL LOW (ref 36.0–46.0)
Hemoglobin: 6.8 g/dL — CL (ref 12.0–15.0)
Immature Granulocytes: 1 %
Lymphocytes Relative: 17 %
Lymphs Abs: 2 K/uL (ref 0.7–4.0)
MCH: 32.2 pg (ref 26.0–34.0)
MCHC: 31.3 g/dL (ref 30.0–36.0)
MCV: 102.8 fL — ABNORMAL HIGH (ref 80.0–100.0)
Monocytes Absolute: 0.8 K/uL (ref 0.1–1.0)
Monocytes Relative: 7 %
Neutro Abs: 8.6 K/uL — ABNORMAL HIGH (ref 1.7–7.7)
Neutrophils Relative %: 74 %
Platelets: 406 K/uL — ABNORMAL HIGH (ref 150–400)
RBC: 2.11 MIL/uL — ABNORMAL LOW (ref 3.87–5.11)
RDW: 15.1 % (ref 11.5–15.5)
WBC: 11.6 K/uL — ABNORMAL HIGH (ref 4.0–10.5)
nRBC: 0 % (ref 0.0–0.2)

## 2024-11-10 LAB — MAGNESIUM: Magnesium: 2.1 mg/dL (ref 1.7–2.4)

## 2024-11-10 LAB — TSH: TSH: 1.61 u[IU]/mL (ref 0.350–4.500)

## 2024-11-10 LAB — RETICULOCYTES
Immature Retic Fract: 24 % — ABNORMAL HIGH (ref 2.3–15.9)
RBC.: 2.11 MIL/uL — ABNORMAL LOW (ref 3.87–5.11)
Retic Count, Absolute: 35.4 K/uL (ref 19.0–186.0)
Retic Ct Pct: 1.7 % (ref 0.4–3.1)

## 2024-11-10 LAB — SAMPLE TO BLOOD BANK

## 2024-11-10 LAB — IRON AND TIBC
Iron: 25 ug/dL — ABNORMAL LOW (ref 28–170)
Saturation Ratios: 16 % (ref 10.4–31.8)
TIBC: 162 ug/dL — ABNORMAL LOW (ref 250–450)
UIBC: 137 ug/dL

## 2024-11-10 LAB — CORTISOL-AM, BLOOD: Cortisol - AM: 15.3 ug/dL (ref 6.7–22.6)

## 2024-11-10 LAB — PROTIME-INR
INR: 2 — ABNORMAL HIGH (ref 0.8–1.2)
Prothrombin Time: 23.5 s — ABNORMAL HIGH (ref 11.4–15.2)

## 2024-11-10 LAB — FOLATE: Folate: 7.1 ng/mL (ref >5.9)

## 2024-11-10 LAB — VITAMIN B12: Vitamin B-12: 429 pg/mL (ref 180–914)

## 2024-11-10 LAB — FERRITIN: Ferritin: 298 ng/mL (ref 11–307)

## 2024-11-10 MED ORDER — SODIUM CHLORIDE 0.9% FLUSH: 10.0000 mL | INTRAVENOUS | Status: DC | PRN

## 2024-11-10 MED ORDER — CHLORHEXIDINE GLUCONATE CLOTH 2 % EX PADS
6.0000 | MEDICATED_PAD | Freq: Every day | CUTANEOUS | Status: DC
  Administered 2024-11-10 – 2024-11-13 (×4): 6 via TOPICAL

## 2024-11-10 MED ORDER — PREGABALIN 50 MG PO CAPS
50.0000 mg | ORAL_CAPSULE | Freq: Every day | ORAL | Status: DC
  Administered 2024-11-11 – 2024-11-12 (×2): 50 mg via ORAL
  Filled 2024-11-10 (×2): qty 1

## 2024-11-10 MED ORDER — HYDRALAZINE HCL 20 MG/ML IJ SOLN
10.0000 mg | INTRAMUSCULAR | Status: DC | PRN
  Administered 2024-11-11: 10 mg via INTRAVENOUS
  Filled 2024-11-10 (×2): qty 1

## 2024-11-10 MED ORDER — ADULT MULTIVITAMIN LIQUID CH
15.0000 mL | Freq: Every day | ORAL | Status: DC
  Administered 2024-11-11 – 2024-11-13 (×3): 15 mL via ORAL
  Filled 2024-11-10 (×3): qty 15

## 2024-11-10 MED ORDER — SODIUM CHLORIDE 0.9% FLUSH
10.0000 mL | Freq: Two times a day (BID) | INTRAVENOUS | Status: DC
  Administered 2024-11-10: 10 mL
  Administered 2024-11-10: 20 mL
  Administered 2024-11-11 – 2024-11-13 (×5): 10 mL

## 2024-11-10 MED ORDER — TORSEMIDE 20 MG PO TABS
20.0000 mg | ORAL_TABLET | Freq: Every day | ORAL | Status: DC
  Administered 2024-11-10 – 2024-11-11 (×2): 20 mg via ORAL
  Filled 2024-11-10 (×2): qty 1

## 2024-11-10 MED ORDER — SODIUM CHLORIDE 0.9% IV SOLUTION: Freq: Once | INTRAVENOUS | Status: AC

## 2024-11-10 NOTE — Progress Notes (Signed)
 [facility] Progress Note Patient: [name] WARNING FMW:[id number] DOB: 1936-11-13  DOA: 11/09/2024 DOS: the patient was seen and examined on 11/10/2024  Brief Hospital Course: Patient with PMH of stage IV vaginal cancer, status post chemoradiation, depression, HTN, chronic A-fib on Eliquis , HFpEF, presented to the hospital with complaints of chest pain. Patient was originally seen in oncology clinic for complaints of vaginal bleeding.  She reported bleeding as spotting. Patient was seen by Dr. Viktoria GYN oncology in the clinic at which time upon exam there was no significant bleeding seen although there was concern for vulvovesicular fistula. Patient was waiting in the cancer center receiving IV fluid at which time she had some chest pain and was brought to the hospital for further workup. Currently plan is to treat what is treatable in the hospital with goal to go home with hospice.  Assessment and Plan: Acute UTI. Urine culture multiple species recollection suggested. Blood culture ordered.  Currently pending. UA with significant pyuria and nitrite positive. Currently on IV ceftriaxone  which I will continue. Was given IV fluid for concern for sepsis although sepsis ruled out. For now monitor blood cultures and if negative switch to oral antibiotic.  Acute kidney injury. Patient has normal creatinine in early November. Creatinine trending up with currently creatinine 1.6 upon admission. Likely combination of obstructive uropathy in the setting of bilateral hydronephrosis secondary to the vaginal mass as well as dehydration with prerenal etiology. Appreciate GYN oncology consultation, suggestion is for consideration for bilateral PCN although patient not a good candidate for such intervention. Plan is conservative management for bilateral hydronephrosis. Goal is comfort and hospice upon discharge.  Symptomatic anemia. Secondary to acute on chronic blood loss. Blood loss in the  setting of vaginal bleeding. Hemoglobin dropped down to 6.8. Status post 1 PRBC transfusion with improvement in hemoglobin to 9.0. Iron level low.  B12 stable 4.9.  Folic acid  relatively low 7.1.  Reticulocyte count elevated. Monitor for now.  History of hypertension. Chronic HFpEF. Left leg edema. Hypotension. Patient's blood pressure was low upon admission. Blood pressure now improving. Resuming home medication. Left leg edema was evaluated with ultrasound which was negative for a DVT. Continue torsemide .  Acute metabolic encephalopathy. In the setting of UTI. Monitor for now. Pneumonic focal deficit.  Chronic A-fib with RVR. Currently rate controlled. On anticoagulation which is on hold.  Pulmonary fibrosis. Currently appears to be stable. Has some basal crackles. Continue diuresis.  Hypothyroidism. Continuing Synthroid .  Goals of care conversation. Discussed with palliative care, GYN oncology as well as medical oncology. Goal is clear, continue to treat treatable conditions in the hospital with once medically stable discharge home with hospice. Continue DNR.     Subjective: No acute complaint.  No nausea no vomiting.  Breathing okay.  Somewhat forgetful.  Physical Exam: Basal crackles. S1-S2 present Bowel sound present.  Some discomfort but no tenderness. Left leg edema. No asterixis.  No focal deficit.  Data Reviewed: I have Reviewed nursing notes, Vitals, and Lab results. Since last encounter, pertinent lab results CBC and BMP   . I have ordered test including CBC and BMP  . I have discussed pt's care plan and test results with palliative care, medical oncology, GYN oncology  .   Disposition: Status is: Inpatient Remains inpatient appropriate because: Monitor for improvement in H&H and mentation  Family Communication: No one at bedside. Level of care: Telemetry   Vitals:   11/10/24 1315 11/10/24 1330 11/10/24 1642 11/10/24 1815  BP: (!) 147/61 ROLLEN)  137/58 (!) 168/66 (!) 163/66  Pulse: 77 74 78 82  Resp: 17  18 16   Temp: 98.9 F (37.2 C) 98.5 F (36.9 C) 97.9 F (36.6 C) (!) 97.3 F (36.3 C)  TempSrc: Oral Oral Oral Oral  SpO2:  97% 98% 97%     Author: Yetta Blanch, MD 11/10/2024 6:32 PM  Please look on [email or web address] to find out who is on call.

## 2024-11-10 NOTE — Progress Notes (Signed)
 Called pts Grandaughter for blood consent.  [place], RN second training and development officer

## 2024-11-10 NOTE — Progress Notes (Signed)
 GYN Onc update  Visited with the patient after our discussion in clinic yesterday.  She is resting comfortably in bed, finishing conversation with our palliative care colleagues.  She voices being somewhat confused.  Shares again some stories with me about the end of her husband's life and other family members who have died.  She voices on multiple occasions wanting to be home.  Reviewed again our discussion regarding percutaneous nephrostomy tubes.  I spoke with one of the urologist who agrees that stents would be at high risk for failure and it PCNs would be recommended if urinary diversion is desired.  I worry that the procedure itself and long-term risks of the procedure outweigh possible benefits.  I spoke with [name] who tells me that the hospice agency was there this morning to set things up.  They have asked [name] to let them know when her grandmother is discharged from the hospital.  Comer Dollar MD Gynecologic Oncology

## 2024-11-10 NOTE — Plan of Care (Signed)
   Problem: Fluid Volume: Goal: Hemodynamic stability will improve Outcome: Progressing   Problem: Clinical Measurements: Goal: Diagnostic test results will improve Outcome: Progressing Goal: Signs and symptoms of infection will decrease Outcome: Progressing

## 2024-11-10 NOTE — Evaluation (Addendum)
 Physical Therapy Evaluation Patient Details Name: [name] MRN: [id number] DOB: 07/20/1936 Today's Date: 11/10/2024  History of Present Illness  88 y.o. female admitted on 11/09/2024 from cancer center with increasing left lower extremity edema, chest tightness, and palpitations. Workup indicates UTI/sepsis. with past medical history of stage IV vaginal cancer s/p concurrent chemoradiation (completed 07/2024), diastolic CHF, osteoarthritis hypothyroidism, hypertension, atrial fibrillation on Eliquis , depression, and osteoporosis  Clinical Impression  Pt admitted with above diagnosis. Pt reports she walks with a RW, lives alone but has family close by who can assist. Pt noted to be saturated in urine at start of session. Assisted pt to transfer to bedside commode, pt had significant difficulty taking pivotal steps with RW bed to bedside commode, she required mod assist. [name] for transfer back to bed. Pitting edema noted L lower leg, noted dopplers were negative for DVT yesterday. If she returns home with hospice, she may need a hoyer lift and hospital bed, depending on progress.  Pt currently with functional limitations due to the deficits listed below (see PT Problem List). Pt will benefit from acute skilled PT to increase their independence and safety with mobility to allow discharge.           If plan is discharge home, recommend the following: Two people to help with walking and/or transfers;A lot of help with bathing/dressing/bathroom;Assistance with cooking/housework;Assist for transportation;Help with stairs or ramp for entrance   Can travel by private vehicle        Equipment Recommendations [place] lift;Hospital bed  Recommendations for Other Services       Functional Status Assessment Patient has had a recent decline in their functional status and demonstrates the ability to make significant improvements in function in a reasonable and predictable amount of time.      Precautions / Restrictions        Mobility  Bed Mobility Overal bed mobility: Needs Assistance Bed Mobility: Supine to Sit     Supine to sit: Mod assist     General bed mobility comments: assist to advance LLE and to raise trunk    Transfers Overall transfer level: Needs assistance Equipment used: Rolling walker (2 wheels) Transfers: Sit to/from Stand, Bed to chair/wheelchair/BSC Sit to Stand: Mod assist   Step pivot transfers: Mod assist       General transfer comment: assist to power up, +2 mod assist to pivot hips to bedside commode from bed with RW, pt had difficulty taking pivotal steps; Stedy used for transfer back to bed Transfer via Lift Equipment: Stedy  Ambulation/Gait               General Gait Details: unable  Careers Information Officer     Tilt Bed    Modified Rankin (Stroke Patients Only)       Balance   Sitting-balance support: Feet supported, No upper extremity supported Sitting balance-Leahy Scale: Fair     Standing balance support: Bilateral upper extremity supported, During functional activity, Reliant on assistive device for balance Standing balance-Leahy Scale: Poor                               Pertinent Vitals/Pain Pain Assessment Pain Assessment: No/denies pain    Home Living Family/patient expects to be discharged to:: Private residence Living Arrangements: Alone Available Help at Discharge: Family;Available PRN/intermittently (son just up road) Type of Home: House Home  Access: Stairs to enter   [employer] of Steps: 1 (banisters on each side)   Home Layout: One level Home Equipment: Grab bars - tub/shower;Rolling Walker (2 wheels);Standard Walker;Cane - single point      Prior Function Prior Level of Function : Independent/Modified Independent             Mobility Comments: walks with RW ADLs Comments: independent adls and iadls, does her own shopping, if needs  help family nearby     Extremity/Trunk Assessment        Lower Extremity Assessment Lower Extremity Assessment: LLE deficits/detail;Overall WFL for tasks assessed LLE Deficits / Details: pitting edema noted L lower leg, doppler's negative for DVT yesterday, knee ext +4/5 LLE Sensation: WNL    Cervical / Trunk Assessment Cervical / Trunk Assessment: Normal  Communication   Communication Communication: No apparent difficulties Factors Affecting Communication: Hearing impaired    Cognition Arousal: Alert Behavior During Therapy: WFL for tasks assessed/performed   PT - Cognitive impairments: No apparent impairments                         Following commands: Intact       Cueing Cueing Techniques: Verbal cues     General Comments      Exercises     Assessment/Plan    PT Assessment Patient needs continued PT services  PT Problem List Decreased activity tolerance;Decreased mobility;Decreased balance       PT Treatment Interventions Therapeutic exercise;Functional mobility training;Gait training;DME instruction;Therapeutic activities    PT Goals (Current goals can be found in the Care Plan section)  Acute Rehab PT Goals Patient Stated Goal: return home PT Goal Formulation: With patient Time For Goal Achievement: 11/24/24 Potential to Achieve Goals: Good    Frequency Min 2X/week     Co-evaluation               AM-PAC PT 6 Clicks Mobility  Outcome Measure Help needed turning from your back to your side while in a flat bed without using bedrails?: A Lot Help needed moving from lying on your back to sitting on the side of a flat bed without using bedrails?: A Lot Help needed moving to and from a bed to a chair (including a wheelchair)?: Total Help needed standing up from a chair using your arms (e.g., wheelchair or bedside chair)?: A Lot Help needed to walk in hospital room?: Total Help needed climbing 3-5 steps with a railing? : Total 6  Click Score: 9    End of Session Equipment Utilized During Treatment: Gait belt Activity Tolerance: Patient limited by fatigue Patient left: in bed;with call bell/phone within reach;with nursing/sitter in room Nurse Communication: Mobility status;Need for lift equipment PT Visit Diagnosis: Other abnormalities of gait and mobility (R26.89);Difficulty in walking, not elsewhere classified (R26.2)    Time: 8560-8492 PT Time Calculation (min) (ACUTE ONLY): 28 min   Charges:   PT Evaluation $PT Eval Moderate Complexity: 1 Mod PT Treatments $Therapeutic Activity: 8-22 mins PT General Charges $$ ACUTE PT VISIT: 1 Visit        Sylvan Delon Copp PT 11/10/2024  Acute Rehabilitation Services  Office [phone]

## 2024-11-10 NOTE — Consult Note (Signed)
 Palliative Care Consult Note                                  Date: 11/10/2024   Patient Name: [name]  DOB: 1935/12/29  MRN: [id number]  Age / Sex: 88 y.o., female  PCP: Sheena Glendia LABOR, MD Referring Physician: Tobie Yetta HERO, MD  Reason for Consultation: Establishing goals of care  HPI/Patient Profile: 88 y.o. female  with past medical history of stage IV vaginal cancer s/p concurrent chemoradiation (completed 07/2024), diastolic CHF, osteoarthritis hypothyroidism, hypertension, atrial fibrillation on Eliquis , depression, and osteoporosis admitted on 11/09/2024 from cancer center with increasing left lower extremity edema, chest tightness, and palpitations.   Workup indicates UTI/sepsis. Given fluids and antibiotics.  Past Medical History:  Diagnosis Date   Arthritis    CHF (congestive heart failure) (HCC)    Chronic Leukemia    Depression    Dysrhythmia    A.fib   on Eliquis    H/O hiatal hernia    Hernia    Hernia    right inguinal hernia   History of radiation therapy    Vagina- 08/21/24-08/25/24- [name]   Hypertension    Hypothyroidism    Leg swelling    Osteitis pubis 05/30/2001   Osteopenia 03/30/2009   Osteopenia 07/30/2022   Bone density 2021 showed elevated FRAX patient was started on Fosamax    Pneumonia    Thyroid  disease     Subjective:   I have reviewed medical records including EPIC notes, labs and imaging, received update from attending team, and assessed the patient. I introduced Palliative Medicine as specialized medical care for people living with serious illness. It focuses on providing relief from symptoms and stress of a serious illness. The goal is to improve quality of life for both the patient and the family. Patient shared she is familiar with palliative care.  Today's Discussion: Patient sitting up in bed with the television on. She reports no discomfort. She is disoriented to  location-- and asks me where we are more than once.   Prior to admission patient lived independently. Her husband of 67 years passed away three years ago with hospice services. They met on a blind date. She has one son. Her grandson's wife [name] is her HCPOA and assists her with her healthcare. The patient was completing most ADLs and IADLs independently. In the last months she stopped driving and began using a cane when walking outside. Her appetite has declined significantly. She tells me she feels like a zombie and that she no longer has her get up and go. Before retiring she worked for [employer] for 35 years. She has enjoyed gardening in her retirement.  Dr. Viktoria came to the room to review their discussion yesterday and discuss options moving forward. Dr. Viktoria shared her plan to call the patient's granddaughter to discuss the path forward. Dr. Viktoria later tells me that [name] has decided to treat the patient's UTI then discharge home with hospice services.  Spoke to the patient's granddaughter [name] (HCPOA) by phone to discuss diagnosis prognosis, GOC, EOL wishes, and disposition options. [name] has a good understanding of the patient's chronic conditions, vaginal cancer, and recent failure to thrive. We discussed the patient's HCPOA document and living will. Confirmed patient's DNR/DNI status. Discussed that patient does not want her life extended with artificial feeding or hydration. [name] believes percutaneous nephrostomy tubes will not add to  the patient's quality of life. [name] wants to follow the patient's wishes and provide the patient comfort and dignity at her end of life. [name] has already discussed hospice services with the hospice agency once the patient is discharged. We discussed hospice philosophy and the plan to avoid rehospitalizations. The family plans to provide around the clock care for the patient at discharge with family members working shifts to provide  care.  Emotional support and therapeutic listening provided. Discussed the importance of continued conversation with family and the medical providers regarding overall plan of care and treatment options, ensuring decisions are within the context of the patient's values and GOCs.  Questions and concerns were addressed. The family was encouraged to call with questions or concerns. PMT will continue to support holistically.  Review of Systems  Constitutional:  Positive for appetite change and fatigue.  Cardiovascular:  Positive for leg swelling (left lower leg).    Objective:   Primary Diagnoses: Present on Admission:  Sepsis due to gram-negative UTI (HCC)  Hyperlipidemia  Acquired hypothyroidism  Essential hypertension, benign  GERD (gastroesophageal reflux disease)  Monoclonal B-cell lymphocytosis  Lymphoproliferative disease (HCC)  Chronic diastolic (congestive) heart failure (HCC)  Idiopathic pulmonary fibrosis (HCC)  Persistent atrial fibrillation (HCC)  Vaginal cancer (HCC)  DNR (do not resuscitate)  Macrocytic anemia   Physical Exam Vitals reviewed.  Constitutional:      General: She is not in acute distress.    Appearance: She is ill-appearing.  HENT:     Head: Normocephalic and atraumatic.  Cardiovascular:     Rate and Rhythm: Normal rate.  Pulmonary:     Effort: Pulmonary effort is normal.  Musculoskeletal:     Left lower leg: Edema present.  Skin:    General: Skin is warm and dry.  Neurological:     Mental Status: She is alert. She is disoriented.     Vital Signs:  BP (!) 142/58 (BP Location: Left Arm)   Pulse 72   Temp 98.3 F (36.8 C) (Oral)   Resp 16   SpO2 96%    Advanced Care Planning:   Existing Vynca/ACP Documentation: HCPOA and Living Will  Primary Decision Maker: HCPOA  Code Status/Advance Care Planning: DNR   Assessment & Plan:   SUMMARY OF RECOMMENDATIONS   DNR/DNI Treat UTI while hospitalized Discharge with home hospice-  granddaughter has already set up services Continued PMT support- plan to meet with granddaughter when she visits Saturday 12/13   Discussed with: Dr. Viktoria and Dr. Tobie  Time Total: 90 minutes    Thank you for allowing us  to participate in the care of [name] PMT will continue to support holistically.   Signed by: Stephane Palin, NP Palliative Medicine Team  Team Phone # [phone] (Nights/Weekends)  11/10/2024, 12:04 PM

## 2024-11-10 NOTE — Progress Notes (Unsigned)
 Addendum:  Ms. Pickeral seen by Eleanor in our office after Dr. Lonn reached out regarding vaginal bleeding.  Upon talking to the patient, she has had minimal spotting.  She has almost continuous leakage of urine.  On our exam today, no bleeding noted but there was urine in the vagina.  There was necrotic appearing tissue at the top of the vagina, no tissue that looked viable for biopsy.  I could not appreciate a definitive vesicovaginal fistula, but I am suspicious of this given her reported symptoms.  I spent quite some time speaking with the patient and her granddaughter who accompanied her to the visit today.  We discussed her overall health status and performance status since completing treatment.  She is spending almost all of her time at home and much of it in bed.  She has not been able to take good p.o. intake.  I reviewed most recent CT scans which shows a persistent mass at the vaginal apex with worsening hydronephrosis.  I am concerned that whether this is related to progression of disease or posttreatment effect, the backup of her urinary system is likely putting her at risk for infection and contributing to her worsened kidney function.  Given the location of this mass and if it is causing distal obstruction as well as the possibility of a vesicovaginal fistula, I think stent placement would be quite challenging and at high risk of failure.  Additionally, if the patient has a vesicovaginal fistula, stent placement will not change her urinary leakage, which has been quite bothersome to her.  I brought up the possibility of bilateral PCNs, both to help alleviate her hydronephrosis and save kidney function but also to decrease/prevent her urinary leakage.  I reached out to one of the urologists who reviewed her imaging and agrees that urinary diversion with PCNs would be best if diversion undertaken.  In the broader scope of her disease and treatment, we discussed that she is not a candidate for  systemic therapy now given her performance status and overall health status.  I brought up decisions about whether to proceed with interventions that may allow her to get stronger versus focusing on quality of life and taking a more palliative approach.  She is not sure what she would like to do and would like some time to talk to her granddaughter and family about this.  Although PCN placement would require a procedure and has some risk (we reviewed this risk today), I think this could be pursued even if we are opting for more of a palliative approach to help with her urinary incontinence.  Comer Dollar MD Gynecologic Oncology

## 2024-11-10 NOTE — TOC Initial Note (Signed)
 Transition of Care [facility]) - Initial/Assessment Note    Patient Details  Name: [name] MRN: [id number] Date of Birth: 1936/03/02  Transition of Care [facility]) CM/SW Contact:    Doneta Glenys DASEN, RN Phone Number: 11/10/2024, 12:54 PM  Clinical Narrative:                 CM visited with patient in the room. CM called Hefley,Brooke Granddaughter, [phone] for additional information. [name] does not remember to name of the Hospice agency being used. [name] said the [facility] is based out of [place]. IP CM following  Expected Discharge Plan: Home w Hospice Care Barriers to Discharge: Continued Medical Work up   Patient Goals and CMS Choice Patient states their goals for this hospitalization and ongoing recovery are:: Home CMS Medicare.gov Compare Post Acute Care list provided to:: Patient Choice offered to / list presented to : Patient [place] ownership interest in [facility].provided to:: Patient    Expected Discharge Plan and Services In-house Referral: NA Discharge Planning Services: CM Consult   Living arrangements for the past 2 months: Single Family Home                 DME Arranged: N/A DME Agency: NA       HH Arranged: NA HH Agency: NA        Prior Living Arrangements/Services Living arrangements for the past 2 months: Single Family Home Lives with:: Self Patient language and need for interpreter reviewed:: Yes Do you feel safe going back to the place where you live?: Yes      Need for Family Participation in Patient Care: Yes (Comment) Care giver support system in place?: Yes (comment) Current home services: DME (cane walker) Criminal Activity/Legal Involvement Pertinent to Current Situation/Hospitalization: No - Comment as needed  Activities of Daily Living   ADL Screening (condition at time of admission) Independently performs ADLs?: Yes (appropriate for developmental age) Is the patient deaf or have difficulty hearing?: No Does  the patient have difficulty seeing, even when wearing glasses/contacts?: No Does the patient have difficulty concentrating, remembering, or making decisions?: No  Permission Sought/Granted Permission sought to share information with : Case Manager Permission granted to share information with : Yes, Verbal Permission Granted  Share Information with NAME: Dragone,Brooke  Granddaughter, Emergency Contact  [phone]           Emotional Assessment Appearance:: Appears stated age Attitude/Demeanor/Rapport: Engaged Affect (typically observed): Appropriate Orientation: : Oriented to Self, Oriented to Place, Oriented to  Time, Oriented to Situation Alcohol / Substance Use: Not Applicable Psych Involvement: No (comment)  Admission diagnosis:  Melena [K92.1] Idiopathic pulmonary fibrosis (HCC) [J84.112] Sepsis due to gram-negative UTI (HCC) [A41.50, N39.0] Sepsis without acute organ dysfunction, due to unspecified organism [facility]) [A41.9] Patient Active Problem List   Diagnosis Date Noted   Acute renal failure 11/09/2024   Sepsis due to gram-negative UTI (HCC) 11/09/2024   Electrolyte disturbance 10/27/2024   Low serum albumin 10/12/2024   ABLA (acute blood loss anemia) 09/28/2024   Vaginal bleeding 09/18/2024   Macrocytic anemia 09/18/2024   Hypocalcemia 09/18/2024   Obesity, Class I, BMI 30-34.9 09/18/2024   Insomnia 09/18/2024   GI bleed 09/17/2024   Hypokalemia 08/31/2024   Dysuria 08/31/2024   DNR (do not resuscitate) 07/23/2024   Pancytopenia, acquired (HCC) 06/16/2024   Anemia due to antineoplastic chemotherapy 05/26/2024   Constipation 05/24/2024   Hypomagnesemia 05/16/2024   Goals of care, counseling/discussion 05/12/2024   Vaginal cancer (HCC) 04/28/2024  Idiopathic peripheral neuropathy 04/28/2024   Hearing deficit, bilateral 04/28/2024   Postmenopausal vaginal bleeding 03/29/2024   Urinary incontinence 03/29/2024   Acute cystitis with hematuria 03/08/2024   Chronic  diastolic (congestive) heart failure (HCC) 01/28/2023   Lymphoproliferative disease (HCC) 01/28/2023   Idiopathic pulmonary fibrosis (HCC) 08/18/2022   Osteopenia 07/30/2022   Persistent atrial fibrillation (HCC)    Monoclonal B-cell lymphocytosis 01/21/2022   Bilateral leg edema 05/24/2015   GERD (gastroesophageal reflux disease) 07/02/2014   Hyperlipidemia 05/16/2013   Acquired hypothyroidism 05/16/2013   Essential hypertension, benign 05/16/2013   PCP:  [name], MD Pharmacy:   [facility] [place], [place] - [address] [place] [address] Phone: [phone] Fax: [phone]     Social Drivers of Health (SDOH) Social History: SDOH Screenings   Food Insecurity: No Food Insecurity (11/09/2024)  Housing: Low Risk (11/09/2024)  Transportation Needs: No Transportation Needs (11/09/2024)  Utilities: Not At Risk (11/09/2024)  Alcohol Screen: Low Risk (07/07/2024)  Depression (PHQ2-9): Medium Risk (10/30/2024)  Financial Resource Strain: Low Risk (07/07/2024)  Physical Activity: Inactive (07/07/2024)  Social Connections: Moderately Integrated (11/09/2024)  Stress: No Stress Concern Present (07/07/2024)  Tobacco Use: Low Risk (11/09/2024)  Health Literacy: Adequate Health Literacy (07/07/2024)   SDOH Interventions:     Readmission Risk Interventions    11/10/2024   12:45 PM  Readmission Risk Prevention Plan  Transportation Screening Complete  Medication Review (RN Care Manager) Complete  PCP or Specialist appointment within 3-5 days of discharge Complete  HRI or Home Care Consult Complete  SW Recovery Care/Counseling Consult Complete  Palliative Care Screening Complete  Skilled Nursing Facility Patient Refused

## 2024-11-10 NOTE — Hospital Course (Signed)
 Patient with PMH of stage IV vaginal cancer, status post chemoradiation, depression, HTN, chronic A-fib on Eliquis , HFpEF, presented to the hospital with complaints of chest pain. Patient was originally seen in oncology clinic for complaints of vaginal bleeding.  She reported bleeding as spotting. Patient was seen by Dr. Viktoria GYN oncology in the clinic at which time upon exam there was no significant bleeding seen although there was concern for vulvovesicular fistula. Patient was waiting in the cancer center receiving IV fluid at which time she had some chest pain and was brought to the hospital for further workup. Currently plan is to treat what is treatable in the hospital with goal to go home with hospice.  Assessment and Plan: Acute UTI. Urine culture multiple species recollection suggested. Blood culture ordered.  Currently no growth for 48 hours. UA with significant pyuria and nitrite positive. Currently on IV ceftriaxone  which I will continue.  Switch to oral cefadroxil  upon discharge. Was given IV fluid for concern for sepsis although sepsis ruled out.  Acute kidney injury.   Combination of obstructive uropathy as well as prerenal etiology. Patient has normal creatinine in early November. Creatinine trending up with currently creatinine 1.6 upon admission. Likely combination of obstructive uropathy in the setting of bilateral hydronephrosis secondary to the vaginal mass as well as dehydration with prerenal etiology. Appreciate GYN oncology consultation, suggestion is for consideration for bilateral PCN although patient not a good candidate for such intervention. Plan is conservative management for bilateral hydronephrosis. Goal is comfort and hospice upon discharge.  Symptomatic anemia. Secondary to acute on chronic blood loss. Blood loss in the setting of vaginal bleeding. Hemoglobin dropped down to 6.8. Status post 1 PRBC transfusion with improvement in hemoglobin to 9.0. Iron  level low.  B12 stable 429.  Folic acid  relatively low 7.1.  Reticulocyte count elevated. Monitor for now.  NumberGoals of care conversation. Discussed with palliative care, GYN oncology as well as medical oncology. Goal is clear, continue to treat treatable conditions in the hospital with once medically stable discharge home with hospice. Continue DNR. Extensive discussion with family again on 12/13.  Answered all questions.  Patient is desirous to go home.  Family wants to arrange equipments in her house before discharge.  Social worker was informed to notify [facility] to arrange home hospice upon discharge.  Patient will be discharged tomorrow home with hospice.  History of hypertension. Chronic HFpEF. Left leg edema. Hypotension. Patient's blood pressure was low upon admission. Blood pressure now improving. Resuming home medication. Left leg edema was evaluated with ultrasound which was negative for a DVT. Continue torsemide .  Acute metabolic encephalopathy. In the setting of UTI. Monitor for now. Pneumonic focal deficit.  Chronic A-fib with RVR. Initially rate controlled.  Now with RVR.  Giving IV Lopressor  x 1.  Continue Lopressor  orally. On anticoagulation which is on hold.  Pulmonary fibrosis. Currently appears to be stable. Has some basal crackles. Continue diuresis.  Hypothyroidism. Continuing Synthroid .

## 2024-11-10 NOTE — Evaluation (Signed)
 Occupational Therapy Evaluation Patient Details Name: [name] MRN: [id number] DOB: Oct 06, 1936 Today's Date: 11/10/2024   History of Present Illness   88 y.o. female admitted on 11/09/2024 from cancer center with increasing left lower extremity edema, chest tightness, and palpitations. Workup indicates UTI/sepsis. with past medical history of stage IV vaginal cancer s/p concurrent chemoradiation (completed 07/2024), diastolic CHF, osteoarthritis hypothyroidism, hypertension, atrial fibrillation on Eliquis , depression, and osteoporosis     Clinical Impressions PTA, patient lives alone with family nearby for support but was relatively independent with B/IADL's and mobility with RW. Currently, patient presents with deficits outlined below (see OT Problem List for details) most significantly decreased activity tolerance, balance, L LE edema and generalized muscle weakness limiting BADL's and functional mobility performance. Pitting edema noted L lower leg, noted dopplers were negative for DVT 11/09/24. OOB limited based on elevated BP post PRBC's with nursing and OT taking vitals. See below for data.  If she returns home with hospice, she may need a hoyer lift and hospital bed, depending on progress which chart reveals home Hospice has been ordered.  Patient requires continued Acute care hospital level OT services to progress safety and functional performance and allow for discharge.        If plan is discharge home, recommend the following:   Help with stairs or ramp for entrance;Assistance with cooking/housework;Two people to help with walking and/or transfers;A lot of help with bathing/dressing/bathroom;Assist for transportation;Supervision due to cognitive status     Functional Status Assessment   Patient has not had a recent decline in their functional status     Equipment Recommendations   Tub/shower seat;Hospital bed;Hoyer lift      Precautions/Restrictions    Precautions Precautions: Fall Precaution/Restrictions Comments: 2 falls in past 6 months Restrictions Weight Bearing Restrictions Per Provider Order: No     Mobility Bed Mobility Overal bed mobility: Needs Assistance Bed Mobility: Supine to Sit, Sit to Supine     Supine to sit: Mod assist     General bed mobility comments: increased assist for L LE management    Transfers Overall transfer level:  (deferred EOB due to elevated BP post PRBCs)                        Balance Overall balance assessment: Needs assistance Sitting-balance support: Feet supported, No upper extremity supported Sitting balance-Leahy Scale: Fair                                     ADL either performed or assessed with clinical judgement   ADL Overall ADL's : Needs assistance/impaired Eating/Feeding: Set up;Bed level   Grooming: Wash/dry hands;Wash/dry face;Oral care;Sitting;Contact guard assist   Upper Body Bathing: Minimal assistance   Lower Body Bathing: Maximal assistance;Sitting/lateral leans   Upper Body Dressing : Minimal assistance;Sitting   Lower Body Dressing: Maximal assistance;Sitting/lateral leans       Toileting- Clothing Manipulation and Hygiene: Maximal assistance;Sitting/lateral lean       Functional mobility during ADLs: +2 for physical assistance;+2 for safety/equipment (STEDY) General ADL Comments: limited activity tolerance and functional reach     Vision Baseline Vision/History: 0 No visual deficits;1 Wears glasses Ability to See in Adequate Light: 0 Adequate Patient Visual Report: No change from baseline              Pertinent Vitals/Pain Pain Assessment Pain Assessment: No/denies pain  Extremity/Trunk Assessment Upper Extremity Assessment Upper Extremity Assessment: Generalized weakness;Right hand dominant   Lower Extremity Assessment Lower Extremity Assessment: Defer to PT evaluation LLE Deficits / Details: pitting edema  noted L lower leg, doppler's negative for DVT yesterday, knee ext +4/5 LLE Sensation: WNL   Cervical / Trunk Assessment Cervical / Trunk Assessment: Normal   Communication Communication Communication: No apparent difficulties Factors Affecting Communication: Hearing impaired   Cognition Arousal: Alert Behavior During Therapy: WFL for tasks assessed/performed Cognition: No apparent impairments                               Following commands: Intact       Cueing  General Comments   Cueing Techniques: Verbal cues  L LE edema +2-3, BP elevated with post PRBC vitals 168/66, HR 78 bpm and SpO2 100% on RA   Exercises Exercises: Other exercises (foam grasp cube squeezes 100 reps x 2 sets rec TID)    Home Living Family/patient expects to be discharged to:: Private residence Living Arrangements: Alone Available Help at Discharge: Family;Available PRN/intermittently (son just up road) Type of Home: House Home Access: Stairs to enter [employer] of Steps: 1 (banisters on each side)   Home Layout: One level     Bathroom Shower/Tub: Producer, Television/film/video: Standard     Home Equipment: Grab bars - tub/shower;Rolling Environmental Consultant (2 wheels);Standard Walker;Cane - single point          Prior Functioning/Environment Prior Level of Function : Independent/Modified Independent             Mobility Comments: walks with RW ADLs Comments: independent adls and iadls, does her own shopping, if needs help family nearby    OT Problem List: Decreased strength;Decreased activity tolerance;Impaired balance (sitting and/or standing);Cardiopulmonary status limiting activity;Obesity;Increased edema   OT Treatment/Interventions: Self-care/ADL training;Therapeutic exercise;Energy conservation;DME and/or AE instruction;Therapeutic activities;Patient/family education;Balance training      OT Goals(Current goals can be found in the care plan section)   Acute  Rehab OT Goals Patient Stated Goal: to be home OT Goal Formulation: With patient Time For Goal Achievement: 11/24/24 Potential to Achieve Goals: Fair ADL Goals Pt Will Perform Upper Body Bathing: with set-up;sitting Pt Will Perform Upper Body Dressing: with supervision;sitting Pt Will Transfer to Toilet: with +2 assist;with mod assist;stand pivot transfer;bedside commode Pt/caregiver will Perform Home Exercise Program: Increased strength;Both right and left upper extremity;With Supervision;With written HEP provided   OT Frequency:  Min 1X/week    AM-PAC OT 6 Clicks Daily Activity     Outcome Measure Help from another person eating meals?: A Little Help from another person taking care of personal grooming?: A Little Help from another person toileting, which includes using toliet, bedpan, or urinal?: A Lot Help from another person bathing (including washing, rinsing, drying)?: A Lot Help from another person to put on and taking off regular upper body clothing?: A Lot Help from another person to put on and taking off regular lower body clothing?: A Lot 6 Click Score: 14   End of Session Equipment Utilized During Treatment: Rolling walker (2 wheels) Nurse Communication: Mobility status;Other (comment) (nursing present taking vitals post PRBCs)  Activity Tolerance: Patient limited by fatigue Patient left: with call bell/phone within reach;in bed;with bed alarm set  OT Visit Diagnosis: Muscle weakness (generalized) (M62.81)                Time: 8367-8342 OT Time Calculation (min): 25  min Charges:  OT General Charges $OT Visit: 1 Visit OT Evaluation $OT Eval Low Complexity: 1 Low Tiffanyann Deroo OT/L Acute Rehabilitation Department  [phone]  11/10/2024, 5:24 PM

## 2024-11-10 NOTE — Plan of Care (Signed)
°  Problem: Fluid Volume: Goal: Hemodynamic stability will improve Outcome: Progressing   Problem: Clinical Measurements: Goal: Diagnostic test results will improve Outcome: Progressing Goal: Signs and symptoms of infection will decrease Outcome: Progressing   Problem: Clinical Measurements: Goal: Will remain free from infection Outcome: Progressing Goal: Diagnostic test results will improve Outcome: Progressing   Problem: Nutrition: Goal: Adequate nutrition will be maintained Outcome: Progressing   Problem: Activity: Goal: Risk for activity intolerance will decrease Outcome: Not Progressing

## 2024-11-11 ENCOUNTER — Other Ambulatory Visit (HOSPITAL_COMMUNITY): Payer: Self-pay

## 2024-11-11 ENCOUNTER — Other Ambulatory Visit: Payer: Self-pay

## 2024-11-11 ENCOUNTER — Inpatient Hospital Stay (HOSPITAL_COMMUNITY)

## 2024-11-11 DIAGNOSIS — Z515 Encounter for palliative care: Secondary | ICD-10-CM

## 2024-11-11 DIAGNOSIS — Z7189 Other specified counseling: Secondary | ICD-10-CM

## 2024-11-11 LAB — CBC
HCT: 27.7 % — ABNORMAL LOW (ref 36.0–46.0)
HCT: 28.2 % — ABNORMAL LOW (ref 36.0–46.0)
Hemoglobin: 8.8 g/dL — ABNORMAL LOW (ref 12.0–15.0)
Hemoglobin: 9.2 g/dL — ABNORMAL LOW (ref 12.0–15.0)
MCH: 32.8 pg (ref 26.0–34.0)
MCH: 32.7 pg (ref 26.0–34.0)
MCHC: 31.8 g/dL (ref 30.0–36.0)
MCHC: 32.6 g/dL (ref 30.0–36.0)
MCV: 103.4 fL — ABNORMAL HIGH (ref 80.0–100.0)
MCV: 100.4 fL — ABNORMAL HIGH (ref 80.0–100.0)
Platelets: 411 K/uL — ABNORMAL HIGH (ref 150–400)
Platelets: 377 K/uL (ref 150–400)
RBC: 2.68 MIL/uL — ABNORMAL LOW (ref 3.87–5.11)
RBC: 2.81 MIL/uL — ABNORMAL LOW (ref 3.87–5.11)
RDW: 15.9 % — ABNORMAL HIGH (ref 11.5–15.5)
RDW: 15.2 % (ref 11.5–15.5)
WBC: 12.8 K/uL — ABNORMAL HIGH (ref 4.0–10.5)
WBC: 15.2 K/uL — ABNORMAL HIGH (ref 4.0–10.5)
nRBC: 0 % (ref 0.0–0.2)
nRBC: 0 % (ref 0.0–0.2)

## 2024-11-11 LAB — MAGNESIUM
Magnesium: 1.9 mg/dL (ref 1.7–2.4)
Magnesium: 1.6 mg/dL — ABNORMAL LOW (ref 1.7–2.4)

## 2024-11-11 LAB — BASIC METABOLIC PANEL WITH GFR
Anion gap: 9 (ref 5–15)
Anion gap: 10 (ref 5–15)
BUN: 18 mg/dL (ref 8–23)
BUN: 20 mg/dL (ref 8–23)
CO2: 28 mmol/L (ref 22–32)
CO2: 27 mmol/L (ref 22–32)
Calcium: 8.5 mg/dL — ABNORMAL LOW (ref 8.9–10.3)
Calcium: 8.3 mg/dL — ABNORMAL LOW (ref 8.9–10.3)
Chloride: 97 mmol/L — ABNORMAL LOW (ref 98–111)
Chloride: 95 mmol/L — ABNORMAL LOW (ref 98–111)
Creatinine, Ser: 1.65 mg/dL — ABNORMAL HIGH (ref 0.44–1.00)
Creatinine, Ser: 1.78 mg/dL — ABNORMAL HIGH (ref 0.44–1.00)
GFR, Estimated: 30 mL/min — ABNORMAL LOW (ref >60)
GFR, Estimated: 27 mL/min — ABNORMAL LOW (ref >60)
Glucose, Bld: 111 mg/dL — ABNORMAL HIGH (ref 70–99)
Glucose, Bld: 170 mg/dL — ABNORMAL HIGH (ref 70–99)
Potassium: 3.9 mmol/L (ref 3.5–5.1)
Potassium: 3.6 mmol/L (ref 3.5–5.1)
Sodium: 134 mmol/L — ABNORMAL LOW (ref 135–145)
Sodium: 132 mmol/L — ABNORMAL LOW (ref 135–145)

## 2024-11-11 LAB — TROPONIN T, HIGH SENSITIVITY: Troponin T High Sensitivity: 46 ng/L — ABNORMAL HIGH (ref 0–19)

## 2024-11-11 MED ORDER — CEFADROXIL 250 MG/5ML PO SUSR: 250.0000 mg | Freq: Two times a day (BID) | ORAL | 0 refills | Status: DC

## 2024-11-11 MED ORDER — METOPROLOL TARTRATE 5 MG/5ML IV SOLN
2.5000 mg | Freq: Once | INTRAVENOUS | Status: AC
  Administered 2024-11-11: 1 mg via INTRAVENOUS

## 2024-11-11 MED ORDER — CEFADROXIL 500 MG PO CAPS: 500.0000 mg | ORAL_CAPSULE | Freq: Every day | ORAL | 0 refills | Status: DC

## 2024-11-11 MED ORDER — ONDANSETRON 4 MG PO TBDP: 4.0000 mg | ORAL_TABLET | Freq: Three times a day (TID) | ORAL | 0 refills | Status: DC | PRN

## 2024-11-11 MED ORDER — METOPROLOL TARTRATE 5 MG/5ML IV SOLN
5.0000 mg | Freq: Once | INTRAVENOUS | Status: AC
  Administered 2024-11-11: 5 mg via INTRAVENOUS
  Filled 2024-11-11: qty 5

## 2024-11-11 MED ORDER — OXYCODONE HCL 5 MG PO TABS: 2.5000 mg | ORAL_TABLET | ORAL | 0 refills | Status: DC | PRN

## 2024-11-11 MED ORDER — SODIUM CHLORIDE 0.9 % IV BOLUS
250.0000 mL | Freq: Once | INTRAVENOUS | Status: AC
  Administered 2024-11-11: 250 mL via INTRAVENOUS

## 2024-11-11 MED ORDER — ALPRAZOLAM 0.5 MG PO TABS: 0.5000 mg | ORAL_TABLET | Freq: Three times a day (TID) | ORAL | 0 refills | Status: DC | PRN

## 2024-11-11 MED ORDER — METOPROLOL TARTRATE 5 MG/5ML IV SOLN
2.5000 mg | Freq: Once | INTRAVENOUS | Status: AC
  Administered 2024-11-11: 2.5 mg via INTRAVENOUS

## 2024-11-11 NOTE — Progress Notes (Signed)
° °      Overnight   NAME: [name] MRN: [id number] DOB : [birth year]    Date of Service   11/11/2024   HPI/Events of Note   HPI :88 year old female with a past medical history of stage IV vaginal cancer, chemoradiation, depression, HTN, chronic A-fib on Eliquis , HFpEF, presented to the hospital with complaints of chest pain.  Patient was at [facility] oncology receiving fluids for concern of vulval vascular fistula when she started to have chest pain and was brought to the hospital for further workup.  Patient was found to have an acute UTI, AKI, symptomatic anemia with hemoglobin 6.8 requiring 1 unit p RBC.  Plan for patient to discharge on home hospice.   Overnight: RN notified of a 6 beat run NSVT at 2129, advised nurse to let me know if she sustains above 120.  2141 patient sustaining in the 180s.  Patient reporting mid chest pain no nausea or headache. rapid response called.   Physical Exam Vitals and nursing note reviewed.  HENT:     Mouth/Throat:     Mouth: Mucous membranes are dry.  Eyes:     Pupils: Pupils are equal, round, and reactive to light.  Cardiovascular:     Rate and Rhythm: Tachycardia present. Rhythm irregular.     Pulses:          Carotid pulses are 3+ on the right side and 3+ on the left side.      Radial pulses are 2+ on the right side and 2+ on the left side.       Posterior tibial pulses are 2+ on the right side and 2+ on the left side.     Heart sounds: Normal heart sounds, S1 normal and S2 normal.  Pulmonary:     Effort: Pulmonary effort is normal.     Breath sounds: Normal breath sounds.  Abdominal:     General: Bowel sounds are normal.     Palpations: Abdomen is soft.     Tenderness: There is no abdominal tenderness.  Musculoskeletal:        General: Swelling present.     Right lower leg: Edema present.     Left lower leg: No edema.  Skin:    General: Skin is warm and dry.     Capillary Refill: Capillary refill takes 2 to 3 seconds.  Neurological:      Mental Status: She is alert, oriented to person, place, and time and easily aroused.     GCS: GCS eye subscore is 4. GCS verbal subscore is 5. GCS motor subscore is 6.  Psychiatric:        Behavior: Behavior is cooperative.      Interventions/ Plan    EKG obtained which showed Afib RVR with HR in the 110-160s and RBBB noted with st changes, 2nd EKG preformed after 1st dose of metoprolol  showed similar. Final EKG verified Afib rate controled 90s Metoprolol  2.5 mg IV ordered x2 (3.5 mg total admin with rate control Fluid bolus 250 cc of normal saline over 1 hour Oxygen -2 L nasal cannula for SpO2 less than 92%.  Labs (cbc, bmp, troponin)       Laneta Gardener- Aram BSN RN CCRN AGACNP-BC Acute Care Nurse Practitioner [facility]

## 2024-11-11 NOTE — Plan of Care (Signed)
°  Problem: Education: Goal: Knowledge of General Education information will improve Description: Including pain rating scale, medication(s)/side effects and non-pharmacologic comfort measures Outcome: Progressing   Problem: Clinical Measurements: Goal: Respiratory complications will improve Outcome: Progressing Goal: Cardiovascular complication will be avoided Outcome: Progressing   Problem: Nutrition: Goal: Adequate nutrition will be maintained Outcome: Progressing   Problem: Pain Managment: Goal: General experience of comfort will improve and/or be controlled Outcome: Progressing   Problem: Safety: Goal: Ability to remain free from injury will improve Outcome: Progressing

## 2024-11-11 NOTE — Progress Notes (Signed)
 [facility] Progress Note Patient: [name] FMW:[id number] DOB: 03/17/1936  DOA: 11/09/2024 DOS: the patient was seen and examined on 11/11/2024  Brief Hospital Course: Patient with PMH of stage IV vaginal cancer, status post chemoradiation, depression, HTN, chronic A-fib on Eliquis , HFpEF, presented to the hospital with complaints of chest pain. Patient was originally seen in oncology clinic for complaints of vaginal bleeding.  She reported bleeding as spotting. Patient was seen by Dr. Viktoria GYN oncology in the clinic at which time upon exam there was no significant bleeding seen although there was concern for vulvovesicular fistula. Patient was waiting in the cancer center receiving IV fluid at which time she had some chest pain and was brought to the hospital for further workup. Currently plan is to treat what is treatable in the hospital with goal to go home with hospice.  Assessment and Plan: Acute UTI. Urine culture multiple species recollection suggested. Blood culture ordered.  Currently no growth for 48 hours. UA with significant pyuria and nitrite positive. Currently on IV ceftriaxone  which I will continue.  Switch to oral cefadroxil  upon discharge. Was given IV fluid for concern for sepsis although sepsis ruled out.  Acute kidney injury.   Combination of obstructive uropathy as well as prerenal etiology. Patient has normal creatinine in early November. Creatinine trending up with currently creatinine 1.6 upon admission. Likely combination of obstructive uropathy in the setting of bilateral hydronephrosis secondary to the vaginal mass as well as dehydration with prerenal etiology. Appreciate GYN oncology consultation, suggestion is for consideration for bilateral PCN although patient not a good candidate for such intervention. Plan is conservative management for bilateral hydronephrosis. Goal is comfort and hospice upon discharge.  Symptomatic anemia. Secondary to  acute on chronic blood loss. Blood loss in the setting of vaginal bleeding. Hemoglobin dropped down to 6.8. Status post 1 PRBC transfusion with improvement in hemoglobin to 9.0. Iron level low.  B12 stable 429.  Folic acid  relatively low 7.1.  Reticulocyte count elevated. Monitor for now.  NumberGoals of care conversation. Discussed with palliative care, GYN oncology as well as medical oncology. Goal is clear, continue to treat treatable conditions in the hospital with once medically stable discharge home with hospice. Continue DNR. Extensive discussion with family again on 12/13.  Answered all questions.  Patient is desirous to go home.  Family wants to arrange equipments in her house before discharge.  Social worker was informed to notify [facility] to arrange home hospice upon discharge.  Patient will be discharged tomorrow home with hospice.  History of hypertension. Chronic HFpEF. Left leg edema. Hypotension. Patient's blood pressure was low upon admission. Blood pressure now improving. Resuming home medication. Left leg edema was evaluated with ultrasound which was negative for a DVT. Continue torsemide .  Acute metabolic encephalopathy. In the setting of UTI. Monitor for now. Pneumonic focal deficit.  Chronic A-fib with RVR. Initially rate controlled.  Now with RVR.  Giving IV Lopressor  x 1.  Continue Lopressor  orally. On anticoagulation which is on hold.  Pulmonary fibrosis. Currently appears to be stable. Has some basal crackles. Continue diuresis.  Hypothyroidism. Continuing Synthroid .   Subjective: No nausea no vomiting no fever no chills.  Patient denies any acute complaint.  Granddaughter at bedside had a question about patient being more foggy.  Patient wants to go home.  Physical Exam: Basilar crackles. S1-S2 present Bowel sound present No edema on the right.  Left leg edema unchanged.  Data Reviewed: I have Reviewed nursing notes, Vitals, and  Lab  results. Since last encounter, pertinent lab results CBC and BMP   . I have ordered test including CBC and BMP  . I have discussed pt's care plan and test results with palliative care  .   Disposition: Status is: Inpatient Remains inpatient appropriate because: Monitor for improvement in RVR and arrangement for home hospice  Family Communication: Family at bedside. Level of care: Telemetry continue for now. Vitals:   11/11/24 0601 11/11/24 0710 11/11/24 1418 11/11/24 1613  BP: (!) 172/74 131/61 (!) 149/70 139/88  Pulse:  91 86 (!) 182  Resp:   16 20  Temp:   98.2 F (36.8 C) 98.4 F (36.9 C)  TempSrc:      SpO2:   97% 96%  Weight:      Height:         Author: Yetta Blanch, MD 11/11/2024 4:33 PM  Please look on [email or web address] to find out who is on call.

## 2024-11-11 NOTE — Progress Notes (Incomplete)
° °      Overnight   NAME: [name] MRN: [id number] DOB : 01/03/1936    Date of Service   11/11/2024   HPI/Events of Note   HPI :88 year old female with a past medical history of stage IV vaginal cancer, chemoradiation, depression, HTN, chronic A-fib on Eliquis , HFpEF, presented to the hospital with complaints of chest pain.  Patient was at [facility] oncology receiving fluids for concern of vulval vascular fistula when she started to have chest pain and was brought to the hospital for further workup.  Patient was found to have an acute UTI, AKI, symptomatic anemia with hemoglobin 6.8 requiring 1 unit p RBC.  Plan for patient to discharge on home hospice.   Overnight: RN notified of a 6 beat run tac  Interventions/ Plan   X X X      Laneta Gardener- Garmon BSN RN CCRN AGACNP-BC Acute Care Nurse Practitioner [facility]

## 2024-11-11 NOTE — Plan of Care (Signed)
°  Problem: Respiratory: Goal: Ability to maintain adequate ventilation will improve Outcome: Progressing   Problem: Activity: Goal: Risk for activity intolerance will decrease Outcome: Progressing   Problem: Coping: Goal: Level of anxiety will decrease Outcome: Progressing   Problem: Pain Managment: Goal: General experience of comfort will improve and/or be controlled Outcome: Progressing   Problem: Skin Integrity: Goal: Risk for impaired skin integrity will decrease Outcome: Progressing

## 2024-11-11 NOTE — TOC Progression Note (Signed)
 Transition of Care [facility]) - Progression Note    Patient Details  Name: [name] MRN: [id number] Date of Birth: [birth year]  Transition of Care [facility]) CM/SW Contact  Heather DELENA Saltness, LCSW Phone Number: 11/11/2024, 1:46 PM  Clinical Narrative:    CSW received secure chat from attending provider stating pt will discharge home with hospice services through [facility] tomorrow. CSW spoke with Duwaine, receptionist, at [facility], via phone call to discuss pt's discharge plan. Duwaine advised CSW to call Lucie [phone], intake specialist, at [facility] regarding discharge plan. CSW attempted to speak with Lucie, no answer, voicemail left. TOC will continue to follow.   Expected Discharge Plan: Home w Hospice Care Barriers to Discharge: Continued Medical Work up  Expected Discharge Plan and Services In-house Referral: NA Discharge Planning Services: CM Consult   Living arrangements for the past 2 months: Single Family Home                 DME Arranged: N/A DME Agency: NA       HH Arranged: NA HH Agency: NA         Social Drivers of Health (SDOH) Interventions SDOH Screenings   Food Insecurity: No Food Insecurity (11/09/2024)  Housing: Low Risk (11/09/2024)  Transportation Needs: No Transportation Needs (11/09/2024)  Utilities: Not At Risk (11/09/2024)  Alcohol  Screen: Low Risk (07/07/2024)  Depression (PHQ2-9): Medium Risk (10/30/2024)  Financial Resource Strain: Low Risk (07/07/2024)  Physical Activity: Inactive (07/07/2024)  Social Connections: Moderately Integrated (11/09/2024)  Stress: No Stress Concern Present (07/07/2024)  Tobacco Use: Low Risk (11/10/2024)  Health Literacy: Adequate Health Literacy (07/07/2024)    Readmission Risk Interventions    11/10/2024   12:45 PM  Readmission Risk Prevention Plan  Transportation Screening Complete  Medication Review (RN Care Manager) Complete  PCP or Specialist appointment within 3-5 days of discharge Complete  HRI or  Home Care Consult Complete  SW Recovery Care/Counseling Consult Complete  Palliative Care Screening Complete  Skilled Nursing Facility Patient Refused    Signed: Heather Saltness, MSW, LCSW Clinical Social Worker Inpatient Care Management 11/11/2024 1:49 PM

## 2024-11-11 NOTE — Plan of Care (Signed)
°  Problem: Clinical Measurements: Goal: Diagnostic test results will improve Outcome: Progressing   Problem: Clinical Measurements: Goal: Ability to maintain clinical measurements within normal limits will improve Outcome: Progressing   Problem: Nutrition: Goal: Adequate nutrition will be maintained Outcome: Progressing

## 2024-11-11 NOTE — Progress Notes (Signed)
 Daily Progress Note   Patient Name: [name]       Date: 11/11/2024 DOB: Jun 07, 1936  Age: 88 y.o. MRN#: [id number] Attending Physician: Tobie Yetta HERO, MD Primary Care Physician: [name], MD Admit Date: 11/09/2024  Reason for Consultation/Follow-up: Establishing goals of care   Length of Stay: 2  Current Medications: Scheduled Meds:   ALPRAZolam   0.5 mg Oral QHS   Chlorhexidine  Gluconate Cloth  6 each Topical Daily   levothyroxine   125 mcg Oral Q0600   magnesium  gluconate  500 mg Oral Q0600   metoprolol  succinate  25 mg Oral Daily   mirtazapine   15 mg Oral QHS   multivitamin  15 mL Oral Daily   pantoprazole   40 mg Oral QAC breakfast   polyethylene glycol  17 g Oral Daily   pregabalin   50 mg Oral QHS   sodium chloride  flush  10-40 mL Intracatheter Q12H   torsemide   20 mg Oral Daily    Continuous Infusions:  cefTRIAXone  (ROCEPHIN )  IV 2 g (11/11/24 0838)    PRN Meds: acetaminophen  **OR** acetaminophen , acetaminophen , albuterol , ALPRAZolam , artificial tears, diphenoxylate -atropine , hydrALAZINE , lidocaine -prilocaine , ondansetron  **OR** ondansetron  (ZOFRAN ) IV, ondansetron , sodium chloride  flush  Physical Exam Vitals reviewed.  Constitutional:      General: She is not in acute distress. HENT:     Head: Normocephalic and atraumatic.  Cardiovascular:     Rate and Rhythm: Normal rate.  Pulmonary:     Effort: Pulmonary effort is normal.  Skin:    General: Skin is warm and dry.  Neurological:     Mental Status: She is alert. She is disoriented.  Psychiatric:        Mood and Affect: Mood normal.        Behavior: Behavior normal.             Vital Signs: BP 131/61   Pulse 91   Temp 98.6 F (37 C) (Oral)   Resp 15   Ht 5' 5 (1.651 m)   Wt 80.2 kg    SpO2 97%   BMI 29.42 kg/m  SpO2: SpO2: 97 % O2 Device: O2 Device: Room Air O2 Flow Rate:      Patient Active Problem List   Diagnosis Date Noted   Acute renal failure 11/09/2024   Sepsis due to gram-negative UTI (HCC)  11/09/2024   Electrolyte disturbance 10/27/2024   Low serum albumin 10/12/2024   ABLA (acute blood loss anemia) 09/28/2024   Vaginal bleeding 09/18/2024   Macrocytic anemia 09/18/2024   Hypocalcemia 09/18/2024   Obesity, Class I, BMI 30-34.9 09/18/2024   Insomnia 09/18/2024   GI bleed 09/17/2024   Hypokalemia 08/31/2024   Dysuria 08/31/2024   DNR (do not resuscitate) 07/23/2024   Pancytopenia, acquired (HCC) 06/16/2024   Anemia due to antineoplastic chemotherapy 05/26/2024   Constipation 05/24/2024   Hypomagnesemia 05/16/2024   Goals of care, counseling/discussion 05/12/2024   Vaginal cancer (HCC) 04/28/2024   Idiopathic peripheral neuropathy 04/28/2024   Hearing deficit, bilateral 04/28/2024   Postmenopausal vaginal bleeding 03/29/2024   Urinary incontinence 03/29/2024   Acute cystitis with hematuria 03/08/2024   Chronic diastolic (congestive) heart failure (HCC) 01/28/2023   Lymphoproliferative disease (HCC) 01/28/2023   Idiopathic pulmonary fibrosis (HCC) 08/18/2022   Osteopenia 07/30/2022   Persistent atrial fibrillation (HCC)    Monoclonal B-cell lymphocytosis 01/21/2022   Bilateral leg edema 05/24/2015   GERD (gastroesophageal reflux disease) 07/02/2014   Hyperlipidemia 05/16/2013   Acquired hypothyroidism 05/16/2013   Essential hypertension, benign 05/16/2013    Palliative Care Assessment & Plan   Patient Profile: 88 y.o. female  with past medical history of stage IV vaginal cancer s/p concurrent chemoradiation (completed 07/2024), diastolic CHF, osteoarthritis hypothyroidism, hypertension, atrial fibrillation on Eliquis , depression, and osteoporosis admitted on 11/09/2024 from cancer center with increasing left lower extremity edema, chest  tightness, and palpitations.    Workup indicates UTI/sepsis. Given fluids and antibiotics.  Today's Discussion: Chart reviewed. Patient evaluated by PT and OT yesterday-- she may need a hoyer lift and hospital bed at discharge depending on progress. Patient sitting up in bed with family at bedside (granddaughter [name], sister, and daughter in law). Patient lets family do most of the talking but listens actively to our discussion.   Patient's family share they are surprised by the patient's decline in functional status and increased confusion. We reviewed her medication list. We discussed potential causes of her confusion including the patient's acute illness, hospitalization, and progression of her disease.   We discussed the plan to discharge home with hospice services through [facility]. Discussed hospice philosophy. Discussed what a transition to comfort focused care would look like. We discussed that the patient would no longer receive aggressive medical interventions such as lab work, radiology testing, or medications not focused on comfort. All care would focus on how the patient is looking and feeling. This would include management of any symptoms that may cause discomfort, pain, shortness of breath, cough, nausea, agitation, anxiety, and/or secretions etc. Symptoms would be managed with medications and other non-pharmacological interventions. Family verbalized understanding and appreciation. Patient shared she wants to go home soon.   Spoke with Dr. Tobie- plan is for dc home with hospice tomorrow.  Recommendations/Plan: DNR/DNI Treat UTI while hospitalized Discharge with home hospice- likely 12/14 granddaughter has already set up services with [facility] Continued PMT support    Code Status:    Code Status Orders  (From admission, onward)           Start     Ordered   11/09/24 2057  Do not attempt resuscitation (DNR)- Limited -Do Not Intubate (DNI)  Continuous       Question Answer  Comment  If pulseless and not breathing No CPR or chest compressions.   In Pre-Arrest Conditions (Patient Is Breathing and Has A Pulse) Do not intubate. Provide all appropriate non-invasive medical  interventions. Avoid ICU transfer unless indicated or required.   Consent: Discussion documented in EHR or advanced directives reviewed      11/09/24 2058         Extensive chart review has been completed prior to seeing the patient including labs, vital signs, imaging, progress/consult notes, orders, medications, and available advance directive documents.  Care plan was discussed with bedside RN and Dr. Tobie  Time spent: 50 minutes  Thank you for allowing the Palliative Medicine Team to assist in the care of this patient.    Stephane CHRISTELLA Palin, NP  Please contact Palliative Medicine Team phone at [phone] for questions and concerns.

## 2024-11-12 LAB — BASIC METABOLIC PANEL WITH GFR
Anion gap: 11 (ref 5–15)
BUN: 21 mg/dL (ref 8–23)
CO2: 26 mmol/L (ref 22–32)
Calcium: 8.5 mg/dL — ABNORMAL LOW (ref 8.9–10.3)
Chloride: 97 mmol/L — ABNORMAL LOW (ref 98–111)
Creatinine, Ser: 2.06 mg/dL — ABNORMAL HIGH (ref 0.44–1.00)
GFR, Estimated: 23 mL/min — ABNORMAL LOW (ref >60)
Glucose, Bld: 134 mg/dL — ABNORMAL HIGH (ref 70–99)
Potassium: 4.6 mmol/L (ref 3.5–5.1)
Sodium: 133 mmol/L — ABNORMAL LOW (ref 135–145)

## 2024-11-12 LAB — MAGNESIUM: Magnesium: 2.4 mg/dL (ref 1.7–2.4)

## 2024-11-12 MED ORDER — POTASSIUM CHLORIDE CRYS ER 20 MEQ PO TBCR
40.0000 meq | EXTENDED_RELEASE_TABLET | Freq: Once | ORAL | Status: AC
  Administered 2024-11-12: 40 meq via ORAL
  Filled 2024-11-12: qty 2

## 2024-11-12 MED ORDER — METOPROLOL SUCCINATE ER 25 MG PO TB24
37.5000 mg | ORAL_TABLET | Freq: Every day | ORAL | Status: DC
  Administered 2024-11-12 – 2024-11-13 (×2): 37.5 mg via ORAL
  Filled 2024-11-12 (×2): qty 2

## 2024-11-12 MED ORDER — BUTALBITAL-APAP-CAFFEINE 50-325-40 MG PO TABS
1.0000 | ORAL_TABLET | Freq: Four times a day (QID) | ORAL | Status: DC | PRN
  Administered 2024-11-12 – 2024-11-13 (×2): 1 via ORAL
  Filled 2024-11-12 (×2): qty 1

## 2024-11-12 MED ORDER — OXYCODONE HCL 5 MG PO TABS
5.0000 mg | ORAL_TABLET | Freq: Four times a day (QID) | ORAL | Status: DC | PRN
  Administered 2024-11-12 – 2024-11-13 (×2): 5 mg via ORAL
  Filled 2024-11-12 (×2): qty 1

## 2024-11-12 MED ORDER — METOPROLOL TARTRATE 5 MG/5ML IV SOLN
5.0000 mg | INTRAVENOUS | Status: AC | PRN
  Administered 2024-11-12 (×2): 5 mg via INTRAVENOUS
  Filled 2024-11-12 (×2): qty 5

## 2024-11-12 MED ORDER — MAGNESIUM SULFATE 2 GM/50ML IV SOLN
2.0000 g | Freq: Once | INTRAVENOUS | Status: AC
  Administered 2024-11-12: 2 g via INTRAVENOUS
  Filled 2024-11-12: qty 50

## 2024-11-12 NOTE — Progress Notes (Signed)
 Rapid Response Event Note   Reason for Call : pt having VT  Initial Focused Assessment: Arrived to find pt HR in the 180's rhythm is tacky and irregular on monitor.   (See flowsheet for VS).  Pt is alert , pleasant and denying pain.  Abd obese and soft. LLE edema present.  Pulses  intact.  Interventions: EKG complete. Reveals A-fib  with RVR.  Metoprolol  IV ordered per [facility] , NP (see orders) at bedside to evaluate pt.  250 NS IV bolus given and labs ordered and Oxygen at 2 L [place] placed.    Plan of Care: Pt HR decreased to rate control (90s) , pt will remain in current location per [facility], NP.  Continue to monitor and report.  See VS.    Event Summary:   MD Notified: yes  Call Time: 2249 Arrival Time:2252 End Time: 2230  Aaradhya Kysar Lavern, RN

## 2024-11-12 NOTE — Progress Notes (Addendum)
 Daily Progress Note   Patient Name: [name]       Date: 11/12/2024 DOB: [birth year]  Age: 88 y.o. MRN#: [id number] Attending Physician: Zella Katha HERO, MD Primary Care Physician: [name], MD Admit Date: 11/09/2024  Reason for Consultation/Follow-up: Establishing goals of care   Length of Stay: 3  Current Medications: Scheduled Meds:   ALPRAZolam   0.5 mg Oral QHS   Chlorhexidine  Gluconate Cloth  6 each Topical Daily   levothyroxine   125 mcg Oral Q0600   magnesium  gluconate  500 mg Oral Q0600   metoprolol  succinate  37.5 mg Oral Daily   mirtazapine   15 mg Oral QHS   multivitamin  15 mL Oral Daily   pantoprazole   40 mg Oral QAC breakfast   polyethylene glycol  17 g Oral Daily   pregabalin   50 mg Oral QHS   sodium chloride  flush  10-40 mL Intracatheter Q12H    Continuous Infusions:  cefTRIAXone  (ROCEPHIN )  IV 2 g (11/12/24 0903)    PRN Meds: acetaminophen  **OR** acetaminophen , acetaminophen , albuterol , ALPRAZolam , artificial tears, diphenoxylate -atropine , hydrALAZINE , lidocaine -prilocaine , metoprolol  tartrate, ondansetron  **OR** ondansetron  (ZOFRAN ) IV, ondansetron , sodium chloride  flush  Physical Exam Vitals reviewed.  Constitutional:      General: She is not in acute distress. HENT:     Head: Normocephalic and atraumatic.  Cardiovascular:     Rate and Rhythm: Normal rate.  Pulmonary:     Effort: Pulmonary effort is normal.  Skin:    General: Skin is warm and dry.  Neurological:     Mental Status: She is alert. She is disoriented.  Psychiatric:        Mood and Affect: Mood normal.        Behavior: Behavior normal.             Vital Signs: BP 130/63   Pulse 83   Temp 97.6 F (36.4 C) (Oral)   Resp 16   Ht 5' 5 (1.651 m)   Wt 80.2 kg   SpO2  96%   BMI 29.42 kg/m  SpO2: SpO2: 96 % O2 Device: O2 Device: Room Air O2 Flow Rate:      Patient Active Problem List   Diagnosis Date Noted   Hospice care patient 11/11/2024   Acute renal failure 11/09/2024   Sepsis due to gram-negative UTI (HCC)  11/09/2024   Electrolyte disturbance 10/27/2024   Low serum albumin 10/12/2024   ABLA (acute blood loss anemia) 09/28/2024   Vaginal bleeding 09/18/2024   Macrocytic anemia 09/18/2024   Hypocalcemia 09/18/2024   Obesity, Class I, BMI 30-34.9 09/18/2024   Insomnia 09/18/2024   GI bleed 09/17/2024   Hypokalemia 08/31/2024   Dysuria 08/31/2024   DNR (do not resuscitate) 07/23/2024   Pancytopenia, acquired (HCC) 06/16/2024   Anemia due to antineoplastic chemotherapy 05/26/2024   Constipation 05/24/2024   Hypomagnesemia 05/16/2024   Goals of care, counseling/discussion 05/12/2024   Vaginal cancer (HCC) 04/28/2024   Idiopathic peripheral neuropathy 04/28/2024   Hearing deficit, bilateral 04/28/2024   Postmenopausal vaginal bleeding 03/29/2024   Urinary incontinence 03/29/2024   Acute cystitis with hematuria 03/08/2024   Chronic diastolic (congestive) heart failure (HCC) 01/28/2023   Lymphoproliferative disease (HCC) 01/28/2023   Idiopathic pulmonary fibrosis (HCC) 08/18/2022   Osteopenia 07/30/2022   Persistent atrial fibrillation (HCC)    Monoclonal B-cell lymphocytosis 01/21/2022   Bilateral leg edema 05/24/2015   GERD (gastroesophageal reflux disease) 07/02/2014   Hyperlipidemia 05/16/2013   Acquired hypothyroidism 05/16/2013   Essential hypertension, benign 05/16/2013    Palliative Care Assessment & Plan   Patient Profile: 88 y.o. female  with past medical history of stage IV vaginal cancer s/p concurrent chemoradiation (completed 07/2024), diastolic CHF, osteoarthritis hypothyroidism, hypertension, atrial fibrillation on Eliquis , depression, and osteoporosis admitted on 11/09/2024 from cancer center with increasing left  lower extremity edema, chest tightness, and palpitations.    Workup indicates UTI/sepsis. Given fluids and antibiotics.  Today's Discussion: Chart reviewed and updates received updates from nursing and attending provider. Patient had rapid response last night for A-fib with RVR and associated chest pain. Attending provider has increased metoprolol  dose for rate control. Plan is to keep patient here today while monitoring her heart rate once this is stabilized she will discharge home with home hospice.  Spoke to patient's granddaughter Brooke by phone. Discussed the patient's A-fib. She feels good about the patient staying another day and then discharging home with hospice. She plans to visit later today.  12:20 Family requested to speak with PMT. Patient's granddaughter, grandson, and son at bedside. Answered all questions. Emotional support and therapeutic listening provided. PMT will continue to support.  Recommendations/Plan: DNR/DNI Discharge with home hospice- likely 12/15 granddaughter has already set up services with [facility] Continued PMT support    Code Status:    Code Status Orders  (From admission, onward)           Start     Ordered   11/09/24 2057  Do not attempt resuscitation (DNR)- Limited -Do Not Intubate (DNI)  Continuous       Question Answer Comment  If pulseless and not breathing No CPR or chest compressions.   In Pre-Arrest Conditions (Patient Is Breathing and Has A Pulse) Do not intubate. Provide all appropriate non-invasive medical interventions. Avoid ICU transfer unless indicated or required.   Consent: Discussion documented in EHR or advanced directives reviewed      11/09/24 2058         Extensive chart review has been completed prior to seeing the patient including labs, vital signs, imaging, progress/consult notes, orders, medications, and available advance directive documents.  Care plan was discussed with bedside RN and Dr. Zella  Time  spent: 50 minutes  Thank you for allowing the Palliative Medicine Team to assist in the care of this patient.    Stephane CHRISTELLA Palin, NP  Please contact  Palliative Medicine Team phone at [phone] for questions and concerns.

## 2024-11-12 NOTE — Progress Notes (Addendum)
°   11/11/24 2209  Assess: MEWS Score  BP 94/63  MAP (mmHg) 74  Pulse Rate (!) 169  SpO2 96 %  O2 Device Room Air  Assess: MEWS Score  MEWS Temp 0  MEWS Systolic 1  MEWS Pulse 3  MEWS RR 0  MEWS LOC 0  MEWS Score 4  MEWS Score Color Red  Assess: if the MEWS score is Yellow or Red  Were vital signs accurate and taken at a resting state? Yes  Does the patient meet 2 or more of the SIRS criteria? No  MEWS guidelines implemented  Yes, red  Treat  MEWS Interventions Considered administering scheduled or prn medications/treatments as ordered  Take Vital Signs  Increase Vital Sign Frequency  Red: Q1hr x2, continue Q4hrs until patient remains green for 12hrs  Escalate  MEWS: Escalate Red: Discuss with charge nurse and notify provider. Consider notifying RRT. If remains red for 2 hours consider need for higher level of care  Notify: Charge Nurse/RN  Name of Charge Nurse/RN Notified Renita, RN  Provider Notification  Provider Name/Title Lavanda Horns, NP, GEANNIE Hue NP  Date Provider Notified 11/11/24  Time Provider Notified 2141  Method of Notification Page (Chat)  Notification Reason Other (Comment) (SVT sustaining 180's)  Provider response See new orders;At bedside  Date of Provider Response 11/11/24  Time of Provider Response 2141  Notify: Rapid Response  Name of Rapid Response RN Notified Maisie, RRT  Date Rapid Response Notified 11/11/24  Time Rapid Response Notified 2142  Assess: SIRS CRITERIA  SIRS Temperature  0  SIRS Respirations  0  SIRS Pulse 1  SIRS WBC 0  SIRS Score Sum  1   At 2129 pt had 6 beat run NSVT pt is asymptomatic.  At 2141 pt HR sustaining 180s with mid chest pain. No dizziness, SOB, headache, nausea or vomiting. The Provider and RRT notified (present at bedside). EKG and vitals obtained New orders: Metoprolol  2.5 mg IV + 0.9 mg 250 NS bolus Oxygen 2L [place]  Labs - troponin, bmp, cbc Intervention effective HR sustaining  80-90s no signs of distress  noted at this time.

## 2024-11-12 NOTE — Plan of Care (Signed)
°  Problem: Respiratory: Goal: Ability to maintain adequate ventilation will improve Outcome: Progressing   Problem: Clinical Measurements: Goal: Respiratory complications will improve Outcome: Progressing Goal: Cardiovascular complication will be avoided Outcome: Progressing   Problem: Nutrition: Goal: Adequate nutrition will be maintained Outcome: Progressing   Problem: Elimination: Goal: Will not experience complications related to urinary retention Outcome: Progressing   Problem: Pain Managment: Goal: General experience of comfort will improve and/or be controlled Outcome: Progressing   Problem: Safety: Goal: Ability to remain free from injury will improve Outcome: Progressing

## 2024-11-12 NOTE — TOC Progression Note (Signed)
 Transition of Care [facility]) - Progression Note    Patient Details  Name: [name] MRN: [id number] Date of Birth: [birth year]  Transition of Care [facility]) CM/SW Contact  Heather DELENA Saltness, LCSW Phone Number: 11/12/2024, 1:36 PM  Clinical Narrative:    Per attending provider and palliative care team, pt to discharge home with hospice services through [facility] tomorrow 12/15.   Expected Discharge Plan: Home w Hospice Care Barriers to Discharge: Continued Medical Work up  Expected Discharge Plan and Services In-house Referral: NA Discharge Planning Services: CM Consult   Living arrangements for the past 2 months: Single Family Home                 DME Arranged: N/A DME Agency: NA       HH Arranged: NA HH Agency: NA         Social Drivers of Health (SDOH) Interventions SDOH Screenings   Food Insecurity: No Food Insecurity (11/09/2024)  Housing: Low Risk (11/09/2024)  Transportation Needs: No Transportation Needs (11/09/2024)  Utilities: Not At Risk (11/09/2024)  Alcohol  Screen: Low Risk (07/07/2024)  Depression (PHQ2-9): Medium Risk (10/30/2024)  Financial Resource Strain: Low Risk (07/07/2024)  Physical Activity: Inactive (07/07/2024)  Social Connections: Moderately Integrated (11/09/2024)  Stress: No Stress Concern Present (07/07/2024)  Tobacco Use: Low Risk (11/10/2024)  Health Literacy: Adequate Health Literacy (07/07/2024)    Readmission Risk Interventions    11/10/2024   12:45 PM  Readmission Risk Prevention Plan  Transportation Screening Complete  Medication Review (RN Care Manager) Complete  PCP or Specialist appointment within 3-5 days of discharge Complete  HRI or Home Care Consult Complete  SW Recovery Care/Counseling Consult Complete  Palliative Care Screening Complete  Skilled Nursing Facility Patient Refused    Signed: Heather Saltness, MSW, LCSW Clinical Social Worker Inpatient Care Management 11/12/2024 1:37 PM

## 2024-11-12 NOTE — Progress Notes (Signed)
 [facility] Progress Note Patient: [name] FMW:[id number] DOB: 1936/08/18  DOA: 11/09/2024 DOS: the patient was seen and examined on 11/12/2024  Brief Hospital Course: Patient with PMH of stage IV vaginal cancer, status post chemoradiation, depression, HTN, chronic A-fib on Eliquis , HFpEF, presented to the hospital with complaints of chest pain. Patient was originally seen in oncology clinic for complaints of vaginal bleeding.  She reported bleeding as spotting. Patient was seen by Dr. Viktoria GYN oncology in the clinic at which time upon exam there was no significant bleeding seen although there was concern for vulvovesicular fistula. Patient was waiting in the cancer center receiving IV fluid at which time she had some chest pain and was brought to the hospital for further workup. Currently plan is to treat what is treatable in the hospital with goal to go home with hospice. She had A-fib with RVR overnight into 2/14, with associated chest pain.  Assessment and Plan: Chronic A-fib with RVR. Initially rate controlled.  Now with RVR overnight again, with some associated complaints of chest pain. Continue Lopressor  orally we will slightly increase her dose for rate control. Was previously on anticoagulation which is on hold. Was given potassium and magnesium  overnight, will recheck these electrolytes this morning to make sure they are not contributing to continued A-fib RVR.  Currently patient without chest pain.  Acute UTI. Urine culture multiple species recollection suggested. Blood culture ordered.  Currently no growth for 48 hours. UA with significant pyuria and nitrite positive. Currently on IV ceftriaxone  which I will continue.  Switch to oral cefadroxil  upon discharge. Was given IV fluid for concern for sepsis although sepsis ruled out.  Acute kidney injury.   Combination of obstructive uropathy as well as prerenal etiology. Patient has normal creatinine in early  November. Creatinine trending up with currently creatinine 1.6 upon admission. Likely combination of obstructive uropathy in the setting of bilateral hydronephrosis secondary to the vaginal mass as well as dehydration with prerenal etiology. Appreciate GYN oncology consultation, suggestion is for consideration for bilateral PCN although patient not a good candidate for such intervention. Plan is conservative management for bilateral hydronephrosis. Goal is comfort and hospice upon discharge.  Symptomatic anemia. Secondary to acute on chronic blood loss. Blood loss in the setting of vaginal bleeding. Hemoglobin dropped down to 6.8. Status post 1 PRBC transfusion with improvement in hemoglobin to 9.0. Iron level low.  B12 stable 429.  Folic acid  relatively low 7.1.  Reticulocyte count elevated. Monitor for now.  NumberGoals of care conversation. Discussed with palliative care, GYN oncology as well as medical oncology. Goal is clear, continue to treat treatable conditions in the hospital with once medically stable discharge home with hospice. Continue DNR. Extensive discussion with family again on 12/13.  Answered all questions.  Patient is desirous to go home.  Family wants to arrange equipments in her house before discharge.  Social worker was informed to notify [facility] to arrange home hospice upon discharge.  Patient will be discharged tomorrow home with hospice.  History of hypertension. Chronic HFpEF. Left leg edema. Hypotension. Patient's blood pressure was low upon admission. Blood pressure now improving. Resuming home medication. Left leg edema was evaluated with ultrasound which was negative for a DVT. Continue torsemide .  Acute metabolic encephalopathy. In the setting of UTI. Monitor for now. Pneumonic focal deficit.  Pulmonary fibrosis. Currently appears to be stable. Has some basal crackles. Continue diuresis.  Hypothyroidism. Continuing Synthroid .    Subjective: No nausea no vomiting no fever no  chills.  Patient denies any acute complaint.  Granddaughter at bedside had a question about patient being more foggy.  Patient wants to go home.  Physical Exam: Basilar crackles.,  Patient resting comfortably not in any acute distress. S1-S2 present Bowel sound present No edema on the right.  Left leg edema noted.  Data Reviewed: I have Reviewed nursing notes, Vitals, and Lab results. Since last encounter, pertinent lab results CBC and BMP   . I have ordered test including CBC and BMP  . I have discussed pt's care plan and test results with palliative care  .   Disposition: Status is: Inpatient Remains inpatient appropriate because: Monitor for improvement in RVR and arrangement for home hospice  Family Communication: No family at bedside, will call granddaughter this afternoon. Level of care: Telemetry continue for now. Vitals:   11/11/24 2227 11/11/24 2243 11/11/24 2324 11/12/24 0332  BP: 121/60  118/62 130/63  Pulse: 86  89 83  Resp:   16 16  Temp: 99.1 F (37.3 C) 98.6 F (37 C) 98.4 F (36.9 C) 97.6 F (36.4 C)  TempSrc: Oral Oral Oral Oral  SpO2:   98% 96%  Weight:      Height:         Author: Chiquetta Langner CHRISTELLA Gail, MD 11/12/2024 8:20 AM  Please look on [email or web address] to find out who is on call.

## 2024-11-13 ENCOUNTER — Inpatient Hospital Stay

## 2024-11-13 ENCOUNTER — Ambulatory Visit: Admitting: Family Medicine

## 2024-11-13 ENCOUNTER — Encounter: Payer: Self-pay | Admitting: Family Medicine

## 2024-11-13 ENCOUNTER — Encounter: Payer: Self-pay | Admitting: Hematology and Oncology

## 2024-11-13 ENCOUNTER — Telehealth: Payer: Self-pay

## 2024-11-13 DIAGNOSIS — N179 Acute kidney failure, unspecified: Secondary | ICD-10-CM | POA: Diagnosis not present

## 2024-11-13 DIAGNOSIS — N390 Urinary tract infection, site not specified: Secondary | ICD-10-CM | POA: Diagnosis not present

## 2024-11-13 DIAGNOSIS — I5032 Chronic diastolic (congestive) heart failure: Secondary | ICD-10-CM

## 2024-11-13 DIAGNOSIS — I4892 Unspecified atrial flutter: Secondary | ICD-10-CM | POA: Diagnosis not present

## 2024-11-13 DIAGNOSIS — I4719 Other supraventricular tachycardia: Secondary | ICD-10-CM | POA: Diagnosis not present

## 2024-11-13 DIAGNOSIS — A4150 Gram-negative sepsis, unspecified: Secondary | ICD-10-CM | POA: Diagnosis not present

## 2024-11-13 LAB — TYPE AND SCREEN
ABO/RH(D): O POS
Antibody Screen: NEGATIVE
Unit division: 0

## 2024-11-13 LAB — BPAM RBC
Blood Product Expiration Date: 202601042359
ISSUE DATE / TIME: 202512121256
Unit Type and Rh: 5100

## 2024-11-13 MED ORDER — METOPROLOL SUCCINATE ER 50 MG PO TB24: 50.0000 mg | ORAL_TABLET | Freq: Every day | ORAL | Status: DC

## 2024-11-13 MED ORDER — OXYCODONE HCL 5 MG PO TABS: 2.5000 mg | ORAL_TABLET | ORAL | 0 refills | Status: DC | PRN

## 2024-11-13 MED ORDER — HEPARIN SOD (PORK) LOCK FLUSH 100 UNIT/ML IV SOLN
500.0000 [IU] | Freq: Once | INTRAVENOUS | Status: DC
  Filled 2024-11-13: qty 5

## 2024-11-13 MED ORDER — ALPRAZOLAM 0.5 MG PO TABS: 0.5000 mg | ORAL_TABLET | Freq: Three times a day (TID) | ORAL | 0 refills | Status: DC | PRN

## 2024-11-13 MED ORDER — METOPROLOL TARTRATE 25 MG PO TABS: 25.0000 mg | ORAL_TABLET | Freq: Two times a day (BID) | ORAL | 0 refills | Status: DC | PRN

## 2024-11-13 MED ORDER — BUTALBITAL-APAP-CAFFEINE 50-325-40 MG PO TABS: 1.0000 | ORAL_TABLET | Freq: Four times a day (QID) | ORAL | 0 refills | Status: DC | PRN

## 2024-11-13 MED ORDER — ONDANSETRON 4 MG PO TBDP: 4.0000 mg | ORAL_TABLET | Freq: Three times a day (TID) | ORAL | 0 refills | Status: DC | PRN

## 2024-11-13 MED ORDER — CEFADROXIL 500 MG PO CAPS: 500.0000 mg | ORAL_CAPSULE | Freq: Every day | ORAL | 0 refills | Status: DC

## 2024-11-13 MED ORDER — METOPROLOL TARTRATE 5 MG/5ML IV SOLN: 5.0000 mg | INTRAVENOUS | Status: DC | PRN

## 2024-11-13 MED ORDER — METOPROLOL SUCCINATE ER 50 MG PO TB24: 50.0000 mg | ORAL_TABLET | Freq: Every day | ORAL | 0 refills | Status: DC

## 2024-11-13 NOTE — Progress Notes (Signed)
 2020: Telemetry monitoring showed the patient fluctuating between Sinus Tachycardia and Atrial Fibrillation with rates up to the 150s; readings were confirmed with the telemetry technician. The on-call physician was notified of the rhythm changes and the patient's positive response to PRN Metoprolol  during the day shift, inquiring about the appropriateness of giving an additional dose of metoprolol . While awaiting a response, telemetry called reported a burst of SVT at 181 bpm. The physician was updated immediately, and Metoprolol  5mg  IV was administered. The patient remained asymptomatic and resting comfortably throughout the event. Subsequently, the patient converted to Sinus Rhythm with a heart rate in the 90s.

## 2024-11-13 NOTE — TOC Transition Note (Signed)
 Transition of Care [facility]) - Discharge Note   Patient Details  Name: [name] MRN: [id number] Date of Birth: 04-16-1936  Transition of Care [facility]) CM/SW Contact:  Toy LITTIE Agar, RN Phone Number:[phone]  11/13/2024, 1:35 PM   Clinical Narrative:    Patient discharging home with hospice services. Patient is currently followed be [facility]. CM spoke with [facility] will follow up once patient arrives home. No other needs noted at this time.    Final next level of care: Home w Hospice Care Barriers to Discharge: No Barriers Identified   Patient Goals and CMS Choice Patient states their goals for this hospitalization and ongoing recovery are:: Home CMS Medicare.gov Compare Post Acute Care list provided to:: Patient Choice offered to / list presented to : Patient [address] ownership interest in [facility].provided to:: Patient    Discharge Placement                       Discharge Plan and Services Additional resources added to the After Visit Summary for   In-house Referral: NA Discharge Planning Services: CM Consult            DME Arranged: N/A DME Agency: NA       HH Arranged: NA HH Agency: Other - See comment [facility]) Date HH Agency Contacted: 11/13/24 Time HH Agency Contacted: 1335 Representative spoke with at [facility] Agency: Amiel  Social Drivers of Health (SDOH) Interventions SDOH Screenings   Food Insecurity: No Food Insecurity (11/09/2024)  Housing: Low Risk (11/09/2024)  Transportation Needs: No Transportation Needs (11/09/2024)  Utilities: Not At Risk (11/09/2024)  Alcohol  Screen: Low Risk (07/07/2024)  Depression (PHQ2-9): Medium Risk (10/30/2024)  Financial Resource Strain: Low Risk (07/07/2024)  Physical Activity: Inactive (07/07/2024)  Social Connections: Moderately Integrated (11/09/2024)  Stress: No Stress Concern Present (07/07/2024)  Tobacco Use: Low Risk  (11/10/2024)  Health Literacy: Adequate Health Literacy (07/07/2024)     Readmission Risk Interventions    11/10/2024   12:45 PM  Readmission Risk Prevention Plan  Transportation Screening Complete  Medication Review (RN Care Manager) Complete  PCP or Specialist appointment within 3-5 days of discharge Complete  HRI or Home Care Consult Complete  SW Recovery Care/Counseling Consult Complete  Palliative Care Screening Complete  Skilled Nursing Facility Patient Refused

## 2024-11-13 NOTE — Discharge Summary (Addendum)
 Discharge Summary  [name] FMW:[id number] DOB: 1936-09-07  PCP: [name], MD  Admit date: 11/09/2024 Discharge date: 11/13/2024   Recommendations for Outpatient Follow-up:  Please follow up with your PCP with CBC and BMP in 1-2 weeks  Discharge Diagnoses:  Active Hospital Problems   Diagnosis Date Noted   Sepsis due to gram-negative UTI (HCC) 11/09/2024   Hospice care patient 11/11/2024   Macrocytic anemia 09/18/2024   DNR (do not resuscitate) 07/23/2024   Vaginal cancer (HCC) 04/28/2024   Lymphoproliferative disease (HCC) 01/28/2023   Chronic diastolic (congestive) heart failure (HCC) 01/28/2023   Idiopathic pulmonary fibrosis (HCC) 08/18/2022   Persistent atrial fibrillation (HCC)    Monoclonal B-cell lymphocytosis 01/21/2022   GERD (gastroesophageal reflux disease) 07/02/2014   Hyperlipidemia 05/16/2013   Acquired hypothyroidism 05/16/2013   Essential hypertension, benign 05/16/2013    Resolved Hospital Problems  No resolved problems to display.   Discharge Condition: Stable   Diet recommendation: Diet Orders (From admission, onward)     Start     Ordered   11/11/24 1226  Diet regular Room service appropriate? Yes; Fluid consistency: Thin  Diet effective now       Question Answer Comment  Room service appropriate? Yes   Fluid consistency: Thin      11/11/24 1226           HPI and Brief Hospital Course:  88 year old female with a history of stage IV vaginal cancer, status post chemoradiation, as well as history of depression, hypertension, chronic A-fib on Eliquis  previously, heart failure with preserved EF who presented to the hospital with complaints of chest pain.  Initially she was seen in the oncology clinic for complaints of vaginal bleeding, no concern for acute bleeding when seen by Dr. Viktoria of [facility], there was some concern for vulval vesicular fistula.  Due to the chest pain she had at the cancer center, she was brought to the  hospital for evaluation she had A-fib with RVR.SABRA  She was started on Toprol -XL, anticoagulation was held due to bleeding and anemia.  She was found to have a UTI and treated with IV Rocephin , completed a course of antibiotics here in the hospital.  She was seen by palliative care during her hospitalization, based on discussion with family decision was made to treat medical issues in the hospital until stable, and then discharged home with hospice.  During her hospitalization, she had intermittent tachycardia, sometimes she complained of concomitant chest pain.  However not very reliable regarding this, as she is demented and quite confused at time, with poor memory.  In any case, her Toprol -XL was gradually increased, she was seen in consultation by cardiology on the day of discharge 12/15.  Ultimately, plan is to continue to hold her Eliquis , and discharged home with metoprolol  XL 50 mg p.o. daily, with as needed Lopressor .  Plan of care was discussed extensively by this writer with the patient's granddaughter over the phone on 12/14 as well as this morning on 12/15 prior to discharge.  She is in agreement with the plan, and all questions were answered to her satisfaction.  Consultations: Palliative care Oncology Cardiology  Discharge details, plan of care and follow up instructions were discussed with patient and any available family or care providers. Patient and family are in agreement with discharge from the hospital today and all questions were answered to their satisfaction.  Discharge Exam: BP 132/64 (BP Location: Right Arm)   Pulse 100   Temp 100.1 F (  37.8 C) (Oral)   Resp 18   Ht 5' 5 (1.651 m)   Wt 80.2 kg   SpO2 98%   BMI 29.42 kg/m  General:  Alert, oriented to self only, calm, in no acute distress  Eyes: EOMI, clear sclerea Neck: supple, no masses, trachea mildline  Cardiovascular: Tachycardic and irregular, no murmurs or rubs, no peripheral edema  Respiratory: clear to  auscultation bilaterally, no wheezes, no crackles  Abdomen: soft, nontender, nondistended, normal bowel tones heard  Skin: dry, no rashes  Musculoskeletal: no joint effusions, normal range of motion  Psychiatric: appropriate affect, normal speech  Neurologic: extraocular muscles intact, clear speech, moving all extremities with intact sensorium   Discharge Instructions You were cared for by a hospitalist during your hospital stay. If you have any questions about your discharge medications or the care you received while you were in the hospital after you are discharged, you can call the unit and asked to speak with the hospitalist on call if the hospitalist that took care of you is not available. Once you are discharged, your primary care physician will handle any further medical issues. Please note that NO REFILLS for any discharge medications will be authorized once you are discharged, as it is imperative that you return to your primary care physician (or establish a relationship with a primary care physician if you do not have one) for your aftercare needs so that they can reassess your need for medications and monitor your lab values.  Discharge Instructions     Increase activity slowly   Complete by: As directed       Allergies as of 11/13/2024       Reactions   Aspirin Other (See Comments)   Caused internal bleeding. Patient had ulcer at the time. Instructed by PCP - Nancey not to take any longer.   Erythromycin Shortness Of Breath   Ibuprofen Other (See Comments)   Instructed not to take by PCP - Nancey due to reaction to aspirin as well (precautionary).        Medication List     STOP taking these medications    Eliquis  5 MG Tabs tablet Generic drug: apixaban    indapamide  1.25 MG tablet Commonly known as: LOZOL    potassium chloride  SA 20 MEQ tablet Commonly known as: KLOR-CON  M   sertraline  100 MG tablet Commonly known as: ZOLOFT    torsemide  20 MG  tablet Commonly known as: DEMADEX        TAKE these medications    albuterol  108 (90 Base) MCG/ACT inhaler Commonly known as: VENTOLIN  HFA INHALE 2 PUFFS INTO THE LUNGS EVERY 6 HOURS AS NEEDED FOR WHEEZING OR SHORTNESS OF BREATH   ALPRAZolam  0.5 MG tablet Commonly known as: XANAX  Take 1 tablet (0.5 mg total) by mouth 3 (three) times daily as needed for anxiety. TAKE 1 TABLET BY MOUTH EVERY NIGHT AT BEDTIME. MAY TAKE 1/2 TABLET DURING THE DAY AS NEEDED What changed: See the new instructions.   butalbital -acetaminophen -caffeine  50-325-40 MG tablet Commonly known as: FIORICET  Take 1 tablet by mouth every 6 (six) hours as needed for headache.      diphenoxylate -atropine  2.5-0.025 MG tablet Commonly known as: LOMOTIL  Take 1 tablet by mouth 4 (four) times daily as needed for diarrhea or loose stools.   levothyroxine  125 MCG tablet Commonly known as: SYNTHROID  TAKE 1 TABLET BY MOUTH DAILY What changed: when to take this   lidocaine -prilocaine  cream Commonly known as: EMLA  Apply 1 Application topically as needed (for port access).  magnesium  gluconate 500 (27 Mg) MG Tabs tablet Commonly known as: MAGONATE Take 1 tablet (500 mg total) by mouth daily at 6 (six) AM.   metoprolol  succinate 50 MG 24 hr tablet Commonly known as: TOPROL -XL Take 1 tablet (50 mg total) by mouth daily. Take with or immediately following a meal. Start taking on: November 14, 2024 What changed:  medication strength how much to take additional instructions   metoprolol  tartrate 25 MG tablet Commonly known as: LOPRESSOR  Take 1 tablet (25 mg total) by mouth 2 (two) times daily as needed (For heart rate sustained over 120).   mirtazapine  15 MG tablet Commonly known as: Remeron  Take 1 tablet (15 mg total) by mouth at bedtime.   MULTI-VITAMIN DAILY PO Take 1 tablet by mouth daily with breakfast.   ondansetron  4 MG disintegrating tablet Commonly known as: ZOFRAN -ODT Take 1 tablet (4 mg total) by  mouth every 8 (eight) hours as needed for nausea or vomiting (DISSOLVE ORALLY).   oxyCODONE  5 MG immediate release tablet Commonly known as: Oxy IR/ROXICODONE  Take 0.5 tablets (2.5 mg total) by mouth every 4 (four) hours as needed for severe pain (pain score 7-10) (dyspnea).   pantoprazole  40 MG tablet Commonly known as: PROTONIX  TAKE 1 TABLET(40 MG) BY MOUTH DAILY What changed:  how much to take how to take this when to take this additional instructions   polyethylene glycol 17 g packet Commonly known as: MiraLax  Take 17 g by mouth daily.   pregabalin  25 MG capsule Commonly known as: LYRICA  Take 50 mg by mouth in the morning.   Systane 0.4-0.3 % Soln Generic drug: Polyethyl Glycol-Propyl Glycol Place 1 drop into both eyes 3 (three) times daily as needed (FOR IRRITATION).   TYLENOL  500 MG tablet Generic drug: acetaminophen  Take 1,000 mg by mouth every 8 (eight) hours as needed for mild pain (pain score 1-3).   Vitamin D3 50 MCG (2000 UT) Tabs Take 2,000 Units by mouth daily.       Allergies[1]  Follow-up Information     [name], MD Follow up.   Specialty: Family Medicine Why: As needed Contact information: [address] [place] [address] [phone]         [facility] Follow up.   Specialty: Hospice and Palliative Medicine Why: To Establish Care Contact information: [address] [place]  [address] [phone]                The results of significant diagnostics from this hospitalization (including imaging, microbiology, ancillary and laboratory) are listed below for reference.    Significant Diagnostic Studies: VAS US  LOWER EXTREMITY VENOUS (DVT) (7a-7p) Result Date: 11/09/2024  Lower Venous DVT Study Patient Name:  [name]  Date of Exam:   11/09/2024 Medical Rec #: [id number]        Accession #:    [id number] Date of Birth: [birth year]        Patient Gender: F Patient Age:   77 years Exam  Location:  [facility] Procedure:      VAS US  LOWER EXTREMITY VENOUS (DVT) Referring Phys: KRISTIE HORTON --------------------------------------------------------------------------------  Indications: Swelling.  Risk Factors: Cancer. Limitations: Poor ultrasound/tissue interface. Comparison Study: No prior studies. Performing Technologist: Cordella Collet RVT  Examination Guidelines: A complete evaluation includes B-mode imaging, spectral Doppler, color Doppler, and power Doppler as needed of all accessible portions of each vessel. Bilateral testing is considered an integral part of a complete examination. Limited examinations for reoccurring indications may  be performed as noted. The reflux portion of the exam is performed with the patient in reverse Trendelenburg.  +---------+---------------+---------+-----------+----------+--------------+ RIGHT    CompressibilityPhasicitySpontaneityPropertiesThrombus Aging +---------+---------------+---------+-----------+----------+--------------+ CFV      Full           Yes      Yes                                 +---------+---------------+---------+-----------+----------+--------------+ SFJ      Full                                                        +---------+---------------+---------+-----------+----------+--------------+ FV Prox  Full                                                        +---------+---------------+---------+-----------+----------+--------------+ FV Mid   Full                                                        +---------+---------------+---------+-----------+----------+--------------+ FV DistalFull                                                        +---------+---------------+---------+-----------+----------+--------------+ PFV      Full                                                        +---------+---------------+---------+-----------+----------+--------------+ POP      Full            Yes      Yes                                 +---------+---------------+---------+-----------+----------+--------------+ PTV      Full                                                        +---------+---------------+---------+-----------+----------+--------------+ PERO     Full                                                        +---------+---------------+---------+-----------+----------+--------------+   +---------+---------------+---------+-----------+----------+--------------+ LEFT     CompressibilityPhasicitySpontaneityPropertiesThrombus Aging +---------+---------------+---------+-----------+----------+--------------+ CFV      Full  Yes      Yes                                 +---------+---------------+---------+-----------+----------+--------------+ SFJ      Full                                                        +---------+---------------+---------+-----------+----------+--------------+ FV Prox  Full                                                        +---------+---------------+---------+-----------+----------+--------------+ FV Mid                  Yes      Yes                                 +---------+---------------+---------+-----------+----------+--------------+ FV Distal               Yes      Yes                                 +---------+---------------+---------+-----------+----------+--------------+ PFV      Full                                                        +---------+---------------+---------+-----------+----------+--------------+ POP      Full           Yes      Yes                                 +---------+---------------+---------+-----------+----------+--------------+ PTV      Full                                                        +---------+---------------+---------+-----------+----------+--------------+ PERO     Full                                                         +---------+---------------+---------+-----------+----------+--------------+     Summary: RIGHT: - There is no evidence of deep vein thrombosis in the lower extremity.  - No cystic structure found in the popliteal fossa.  LEFT: - There is no evidence of deep vein thrombosis in the lower extremity.  - No cystic structure found in the popliteal fossa.  *See table(s) above for measurements and observations. Electronically signed by Debby Robertson on 11/09/2024 at 10:25:28 PM.    Final  DG Chest Port 1 View Result Date: 11/09/2024 CLINICAL DATA:  Shortness of breath. EXAM: PORTABLE CHEST 1 VIEW COMPARISON:  Chest radiograph dated 11/01/2024. FINDINGS: Right-sided Port-A-Cath in similar position. No focal consolidation, pleural effusion or pneumothorax. Stable cardiac silhouette. No acute osseous pathology. Multiple surgical clips over the upper abdomen. IMPRESSION: No active disease. Electronically Signed   By: Vanetta Chou M.D.   On: 11/09/2024 16:42   DG Chest Port 1 View Result Date: 11/01/2024 CLINICAL DATA:  [id number] Tachycardia [id number], chest pressure, atrial fibrillation, history of pulmonary fibrosis and vaginal cancer EXAM: PORTABLE CHEST 1 VIEW COMPARISON:  10/12/2022, 10/27/2024 FINDINGS: Stable cardiomegaly and diffuse chronic reticulonodular interstitial opacities compatible with chronic interstitial lung disease/fibrosis. No definite superimposed acute pneumonia, collapse or consolidation. Negative for acute edema, CHF, large effusion or pneumothorax. Right IJ port catheter tip mid SVC level. Trachea midline. Aorta atherosclerotic. Postop changes in the epigastric region. IMPRESSION: 1. Stable cardiomegaly and chronic interstitial lung disease/fibrosis. 2. No acute chest process by plain radiography. Electronically Signed   By: CHRISTELLA.  Shick M.D.   On: 11/01/2024 12:23   CT ABDOMEN PELVIS W CONTRAST Result Date: 10/27/2024 EXAM: CT ABDOMEN AND PELVIS WITH CONTRAST 10/27/2024 01:11:49 AM  TECHNIQUE: CT of the abdomen and pelvis was performed with the administration of 100 mL of iohexol  (OMNIPAQUE ) 300 MG/ML solution. Multiplanar reformatted images are provided for review. Automated exposure control, iterative reconstruction, and/or weight-based adjustment of the mA/kV was utilized to reduce the radiation dose to as low as reasonably achievable. COMPARISON: 10/13/2024 CLINICAL HISTORY: Abdominal pain, acute, nonlocalized. FINDINGS: LOWER CHEST: No focal infiltrate is seen in the lung bases. Hiatal hernia is again seen and stable. LIVER: Liver is within normal limits. GALLBLADDER AND BILE DUCTS: Gallbladder has been surgically removed. No biliary ductal dilatation. SPLEEN: The spleen is within normal limits. PANCREAS: The pancreas is within normal limits. ADRENAL GLANDS: The adrenal glands are unremarkable. KIDNEYS, URETERS AND BLADDER: Kidneys demonstrate some slight decreased enhancement on the left on the initial images as well as on delayed images. Bilateral hydronephrosis and hydroureter are seen. This is slightly more prominent on the left than the right. This is felt to be related to local compression by the known enhancing mass lesion at the superior aspect of the vaginal cuff. These changes are similar to that noted on the prior exam. No cystic change is again identified and stable. The bladder is decompressed. GI AND BOWEL: Stomach again shows a moderate hiatal hernia. No obstructive or inflammatory changes of the colon are seen. The appendix is not well visualized and may have been surgically removed. No inflammatory changes to suggest appendicitis are seen. Small bowel is within normal limits. PERITONEUM AND RETROPERITONEUM: No ascites. No free air. VASCULATURE: Aortic calcifications are seen. LYMPH NODES: No significant lymphadenopathy is noted in the retroperitoneum. Left iliac adenopathy is noted, measuring up to 15 mm, best seen on image 69 of series 2. Prominent lymphadenopathy is  noted. REPRODUCTIVE ORGANS: Known enhancing mass lesion at the superior aspect of the vaginal cuff, stable in appearance. . This again represents vaginal carcinoma. BONES AND SOFT TISSUES: No acute osseous abnormality is seen. Degenerative change of the lumbar spine is noted. Fat-containing right inguinal hernia is noted. IMPRESSION: 1. Bilateral hydronephrosis and hydroureter, slightly more prominent on the left, likely related to local compression by the known enhancing mass at the superior aspect of the vaginal cuff; findings are similar to the prior exam. 2. Left iliac adenopathy measuring up to 15 mm. Electronically signed  by: Oneil Devonshire MD 10/27/2024 01:32 AM EST RP Workstation: [id number]    Microbiology: Recent Results (from the past 240 hours)  Urine Culture     Status: Abnormal   Collection Time: 11/09/24 11:12 AM   Specimen: Urine, Clean Catch  Result Value Ref Range Status   Specimen Description   Final    URINE, CLEAN CATCH Performed at [facility], [address], [place], [place] [address]    Special Requests   Final    NONE Performed at [facility], [address] [place], [place] [address]    Culture MULTIPLE SPECIES PRESENT, SUGGEST RECOLLECTION (A)  Final   Report Status 11/10/2024 FINAL  Final  Resp panel by RT-PCR (RSV, Flu A&B, Covid) Line, Central     Status: None   Collection Time: 11/09/24  4:24 PM   Specimen: Line, Central; Nasal Swab  Result Value Ref Range Status   SARS Coronavirus 2 by RT PCR NEGATIVE NEGATIVE Final    Comment: (NOTE) SARS-CoV-2 target nucleic acids are NOT DETECTED.  The SARS-CoV-2 RNA is generally detectable in upper respiratory specimens during the acute phase of infection. The lowest concentration of SARS-CoV-2 viral copies this assay can detect is 138 copies/mL. A negative result does not preclude SARS-Cov-2 infection and should not be used as the sole basis for treatment or other  patient management decisions. A negative result may occur with  improper specimen collection/handling, submission of specimen other than nasopharyngeal swab, presence of viral mutation(s) within the areas targeted by this assay, and inadequate number of viral copies(<138 copies/mL). A negative result must be combined with clinical observations, patient history, and epidemiological information. The expected result is Negative.  Fact Sheet for Patients:  bloggercourse.com  Fact Sheet for Healthcare Providers:  seriousbroker.it  This test is no t yet approved or cleared by the [place]  FDA and  has been authorized for detection and/or diagnosis of SARS-CoV-2 by FDA under an Emergency Use Authorization (EUA). This EUA will remain  in effect (meaning this test can be used) for the duration of the COVID-19 declaration under Section 564(b)(1) of the Act, 21 U.S.C.section 360bbb-3(b)(1), unless the authorization is terminated  or revoked sooner.       Influenza A by PCR NEGATIVE NEGATIVE Final   Influenza B by PCR NEGATIVE NEGATIVE Final    Comment: (NOTE) The Xpert Xpress SARS-CoV-2/FLU/RSV plus assay is intended as an aid in the diagnosis of influenza from Nasopharyngeal swab specimens and should not be used as a sole basis for treatment. Nasal washings and aspirates are unacceptable for Xpert Xpress SARS-CoV-2/FLU/RSV testing.  Fact Sheet for Patients: bloggercourse.com  Fact Sheet for Healthcare Providers: seriousbroker.it  This test is not yet approved or cleared by the [place]  FDA and has been authorized for detection and/or diagnosis of SARS-CoV-2 by FDA under an Emergency Use Authorization (EUA). This EUA will remain in effect (meaning this test can be used) for the duration of the COVID-19 declaration under Section 564(b)(1) of the Act, 21 U.S.C. section  360bbb-3(b)(1), unless the authorization is terminated or revoked.     Resp Syncytial Virus by PCR NEGATIVE NEGATIVE Final    Comment: (NOTE) Fact Sheet for Patients: bloggercourse.com  Fact Sheet for Healthcare Providers: seriousbroker.it  This test is not yet approved or cleared by the [place]  FDA and has been authorized for detection and/or diagnosis of SARS-CoV-2 by FDA under an Emergency Use Authorization (EUA). This EUA will remain in effect (meaning  this test can be used) for the duration of the COVID-19 declaration under Section 564(b)(1) of the Act, 21 U.S.C. section 360bbb-3(b)(1), unless the authorization is terminated or revoked.  Performed at [facility], [address] [place], [place] [address]   Culture, blood (routine x 2)     Status: None (Preliminary result)   Collection Time: 11/09/24  4:26 PM   Specimen: BLOOD  Result Value Ref Range Status   Specimen Description   Final    BLOOD PORTA CATH Performed at [facility], [address], [place], [place] [address]    Special Requests   Final    BOTTLES DRAWN AEROBIC AND ANAEROBIC Blood Culture results may not be optimal due to an inadequate volume of blood received in culture bottles Performed at [facility], [address] [place], [place] [address]    Culture   Final    NO GROWTH 4 DAYS Performed at [facility], [address], [place], [place] [address]    Report Status PENDING  Incomplete  Culture, blood (routine x 2)     Status: None (Preliminary result)   Collection Time: 11/10/24  1:06 AM   Specimen: BLOOD  Result Value Ref Range Status   Specimen Description   Final    BLOOD LEFT ANTECUBITAL Performed at [facility], [address], [place], [place] [address]    Special Requests   Final    BOTTLES DRAWN AEROBIC AND ANAEROBIC Blood Culture results may not  be optimal due to an inadequate volume of blood received in culture bottles Performed at [facility], [address], [place], [place] [address]    Culture   Final    NO GROWTH 3 DAYS Performed at [facility], [address] [place], [place] [address]    Report Status PENDING  Incomplete     Labs: Basic Metabolic Panel: Recent Labs  Lab 11/09/24 1623 11/10/24 0106 11/11/24 0515 11/11/24 2250 11/12/24 1427  NA 132* 133* 134* 132* 133*  K 4.4 4.5 3.9 3.6 4.6  CL 97* 100 97* 95* 97*  CO2 23 24 28 27 26   GLUCOSE 189* 113* 111* 170* 134*  BUN 21 19 18 20 21   CREATININE 1.53* 1.43* 1.65* 1.78* 2.06*  CALCIUM  8.3* 8.0* 8.5* 8.3* 8.5*  MG 1.8 2.1 1.9 1.6* 2.4   Liver Function Tests: Recent Labs  Lab 11/09/24 1023 11/09/24 1623 11/10/24 0106  AST 20 18 16   ALT 12 10 7   ALKPHOS 109 106 96  BILITOT 0.4 0.2 0.2  PROT 7.0 6.5 5.9*  ALBUMIN 3.2* 3.0* 2.7*   Recent Labs  Lab 11/09/24 1623  LIPASE 21   No results for input(s): AMMONIA in the last 168 hours. CBC: Recent Labs  Lab 11/09/24 1023 11/09/24 1623 11/10/24 0106 11/10/24 0918 11/10/24 1703 11/11/24 0204 11/11/24 2250  WBC 15.4* 14.9* 13.0* 11.6* 12.6* 12.8* 15.2*  NEUTROABS 13.3* 12.0*  --  8.6*  --   --   --   HGB 8.5* 8.2* 7.4* 6.8* 9.0* 8.8* 9.2*  HCT 26.6* 26.0* 23.6* 21.7* 28.2* 27.7* 28.2*  MCV 99.6 104.0* 103.5* 102.8* 101.8* 103.4* 100.4*  PLT 484* 457* 447* 406* 390 411* 377   Cardiac Enzymes: No results for input(s): CKTOTAL, CKMB, CKMBINDEX, TROPONINI in the last 168 hours. BNP: BNP (last 3 results) Recent Labs    10/02/24 0934  BNP 120.9*    ProBNP (last 3 results) Recent Labs  11/09/24 1623  PROBNP 3,009.0*    CBG: No results for input(s): GLUCAP in the last 168 hours.  Time spent: > 30 minutes were spent in preparing this discharge including medication reconciliation, counseling, and coordination of care.  Signed:  Adalynd Donahoe CHRISTELLA Gail, MD  [facility] 11/13/2024, 11:44 AM     [1]  Allergies Allergen Reactions   Aspirin Other (See Comments)    Caused internal bleeding. Patient had ulcer at the time. Instructed by PCP - Nancey not to take any longer.   Erythromycin Shortness Of Breath   Ibuprofen Other (See Comments)    Instructed not to take by PCP - Nancey due to reaction to aspirin as well (precautionary).

## 2024-11-13 NOTE — Plan of Care (Signed)
°  Problem: Clinical Measurements: Goal: Cardiovascular complication will be avoided Outcome: Not Progressing   Problem: Clinical Measurements: Goal: Will remain free from infection Outcome: Progressing Goal: Respiratory complications will improve Outcome: Progressing   Problem: Pain Managment: Goal: General experience of comfort will improve and/or be controlled Outcome: Progressing   Problem: Safety: Goal: Ability to remain free from injury will improve Outcome: Progressing   Problem: Skin Integrity: Goal: Risk for impaired skin integrity will decrease Outcome: Progressing

## 2024-11-13 NOTE — Progress Notes (Signed)
 Port deaccessed, Heparin  locked, instructions reviewed with granddaughter, understanding verbalized, transferred to wheelchair.

## 2024-11-13 NOTE — Consult Note (Addendum)
 Cardiology Consultation   Patient ID: [name] MRN: [id number]; DOB: [birth year]  Admit date: 11/09/2024 Date of Consult: 11/13/2024  PCP:  [name], MD   [facility] Providers Cardiologist:  Jerel Balding, MD   Patient Profile: [name] is a 88 y.o. female with a hx of stage IV vaginal cancer, chronic HFpEF, permanent atrial fibrillation, hypothyroidism, hypertension, pulmonary fibrosis, who is being seen 11/13/2024 for the evaluation of atrial fibrillation at the request of Dr. Ronald.  History of Present Illness: [name] has history of permanent atrial fibrillation.  She has had prior cardioversion with early recurrence of atrial fibrillation and severely dilated atria managed on low-dose beta-blocker.  She is asymptomatic.  She was last seen in office 03/2024 reporting shortness of breath that had been worsening over the year, he was trying to get into medications out for pulmonary fibrosis.  She was in atrial fibrillation at that time.  Additionally, patient has history of stage IV vaginal cancer and was seen prior to admission reporting chest pain and vaginal bleeding.  Troponins were in the low 30s and 40s.  Cardiology was consulted for intermittent episodes of A-fib RVR/SVT.  She actually has been predominantly sinus rhythm, did have a couple of SVT/atrial fibrillation with RVR, HR 150s+, responsive to IV and asymptomatic.  Hospital course has been complicated by UTI, metabolic encephalopathy, AKI secondary to obstructive uropathy/prerenal, anemia with hemoglobin of 6.8 requiring 1 unit of PRBC.  Has had issues with hypomagnesium that has been repleted and normalized.  Plan is to discharge to hospice, she is DNR.  Evaluated patient at the bedside, she is alert but not oriented to place, year.  She is very slow to response and very confused at this time.  However, she did not report any tachycardia, palpitations or chest pain.  Denied any shortness  of breath, orthopnea.  Reported that her biggest complaint yesterday was a headache.  Very difficult to obtain any other further history.  But otherwise reports no acute symptoms, resting comfortably and was sleeping.  proBNP 3000+, normal lactic acid.  Potassium 4.6.  Creatinine 2.06.  WBC 15.2.  Hemoglobin 9.2.  Past Medical History:  Diagnosis Date   Arthritis    CHF (congestive heart failure) (HCC)    Chronic Leukemia    Depression    Dysrhythmia    A.fib   on Eliquis    H/O hiatal hernia    Hernia    Hernia    right inguinal hernia   History of radiation therapy    Vagina- 08/21/24-08/25/24- Dr. Lynwood Nasuti   Hypertension    Hypothyroidism    Leg swelling    Osteitis pubis 05/30/2001   Osteopenia 03/30/2009   Osteopenia 07/30/2022   Bone density 2021 showed elevated FRAX patient was started on Fosamax    Pneumonia    Thyroid  disease     Past Surgical History:  Procedure Laterality Date   ABDOMINAL HYSTERECTOMY  03/1970   APPENDECTOMY  03/1966   BILATERAL SALPINGOOPHORECTOMY     CARDIOVERSION N/A 03/10/2022   Procedure: CARDIOVERSION;  Surgeon: Balding Jerel, MD;  Location: [facility];  Service: Cardiovascular;  Laterality: N/A;   CATARACT EXTRACTION W/PHACO Right 09/25/2014   Procedure: CATARACT EXTRACTION PHACO AND INTRAOCULAR LENS PLACEMENT (IOC);  Surgeon: Oneil T. Roz, MD;  Location: [facility];  Service: Ophthalmology;  Laterality: Right;  CDE:7.56   CATARACT EXTRACTION W/PHACO Left 10/09/2014   Procedure: CATARACT EXTRACTION PHACO AND INTRAOCULAR LENS PLACEMENT LEFT EYE CDE=6.51;  Surgeon: Oneil  IVAR Platts, MD;  Location: [facility];  Service: Ophthalmology;  Laterality: Left;   CHOLECYSTECTOMY  1980   COLONOSCOPY  1999   COLONOSCOPY  March 2007   Repeat 10 years   COLONOSCOPY  11/06/2003    Internal and external hemorrhoids/Two tiny rectal distal sigmoid polyps cold biopsied/Otherwise within normal limits to the cecum   COLONOSCOPY N/A 08/29/2014   Dr. Shaaron:  Sessile polyp was found at the cecum status post cold snare polypectomy, tubular adenoma. No further surveillance unless clinical status changes   COLONOSCOPY N/A 09/19/2024   Procedure: COLONOSCOPY;  Surgeon: Eartha Angelia Sieving, MD;  Location: [facility];  Service: Gastroenterology;  Laterality: N/A;   CYSTOSCOPY N/A 05/24/2024   Procedure: CYSTOSCOPY;  Surgeon: Viktoria Comer SAUNDERS, MD;  Location: [facility];  Service: Gynecology;  Laterality: N/A;   ESOPHAGOGASTRODUODENOSCOPY  2007   Dr. Shaaron: normal esophagus, small tom oderate sized hiatal hernia, Cameron lesions   ESOPHAGOGASTRODUODENOSCOPY N/A 09/19/2024   Procedure: EGD (ESOPHAGOGASTRODUODENOSCOPY);  Surgeon: Eartha Angelia, Sieving, MD;  Location: [facility];  Service: Gastroenterology;  Laterality: N/A;   EXAM UNDER ANESTHESIA, PELVIC N/A 05/24/2024   Procedure: EXAM UNDER ANESTHESIA, PELVIC;  Surgeon: Viktoria Comer SAUNDERS, MD;  Location: [facility];  Service: Gynecology;  Laterality: N/A;   IR IMAGING GUIDED PORT INSERTION  05/23/2024   SKIN CANCER EXCISION Left 11 1 2016   left arm squamous cancer   TUMOR REMOVAL  03/1968   ovarian      Scheduled Meds:  ALPRAZolam   0.5 mg Oral QHS   Chlorhexidine  Gluconate Cloth  6 each Topical Daily   levothyroxine   125 mcg Oral Q0600   magnesium  gluconate  500 mg Oral Q0600   metoprolol  succinate  37.5 mg Oral Daily   mirtazapine   15 mg Oral QHS   multivitamin  15 mL Oral Daily   pantoprazole   40 mg Oral QAC breakfast   polyethylene glycol  17 g Oral Daily   pregabalin   50 mg Oral QHS   sodium chloride  flush  10-40 mL Intracatheter Q12H   Continuous Infusions:  cefTRIAXone  (ROCEPHIN )  IV Stopped (11/12/24 0933)   PRN Meds: acetaminophen  **OR** acetaminophen , acetaminophen , albuterol , ALPRAZolam , artificial tears, butalbital -acetaminophen -caffeine , diphenoxylate -atropine , hydrALAZINE , lidocaine -prilocaine , ondansetron  **OR** ondansetron  (ZOFRAN ) IV, ondansetron , oxyCODONE , sodium  chloride flush  Allergies:   Allergies[1]  Social History:   Social History   Socioeconomic History   Marital status: Widowed    Spouse name: Not on file   Number of children: 1   Years of education: 63   Highest education level: Not on file  Occupational History   Occupation: Retired    Associate Professor: RETIRED    Comment: Ship Broker, 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   Widowed since 08-15-21. Husband W.E. Logsdon, died from Kidney Cancer.   Son-Tommy lives nearby.   2 grandson's deceased, Clint and Penne. Penne passed 02/2022 from Brain Cancer.    Social Drivers of Health   Tobacco Use:  Low Risk (11/10/2024)   Patient History    Smoking Tobacco Use: Never    Smokeless Tobacco Use: Never    Passive Exposure: Never  Financial Resource Strain: Low Risk (07/07/2024)   Overall Financial Resource Strain (CARDIA)    Difficulty of Paying Living Expenses: Not hard at all  Food Insecurity: No Food Insecurity (11/09/2024)   Epic    Worried About Programme Researcher, Broadcasting/film/video in the Last Year: Never true    Ran Out of Food in the Last Year: Never true  Transportation Needs: No Transportation Needs (11/09/2024)   Epic    Lack of Transportation (Medical): No    Lack of Transportation (Non-Medical): No  Physical Activity: Inactive (07/07/2024)   Exercise Vital Sign    Days of Exercise per Week: 0 days    Minutes of Exercise per Session: Not on file  Stress: No Stress Concern Present (07/07/2024)   [employer] of Occupational Health - Occupational  Stress Questionnaire    Feeling of Stress: Only a little  Social Connections: Moderately Integrated (11/09/2024)   Social Connection and Isolation Panel    Frequency of Communication with Friends and Family: Twice a week    Frequency of Social Gatherings with Friends and Family: Twice a week    Attends Religious Services: More than 4 times per year    Active Member of [employer] or Organizations: Yes    Attends Banker Meetings: 1 to 4 times per year    Marital Status: Widowed  Intimate Partner Violence: Not At Risk (11/09/2024)   Epic    Fear of Current or Ex-Partner: No    Emotionally Abused: No    Physically Abused: No    Sexually Abused: No  Depression (PHQ2-9): Medium Risk (10/30/2024)   Depression (PHQ2-9)    PHQ-2 Score: 5  Alcohol  Screen: Low Risk (07/07/2024)   Alcohol  Screen    Last Alcohol  Screening Score (AUDIT): 0  Housing: Low Risk (11/09/2024)   Epic    Unable to Pay for Housing in the Last Year: No    Number of Times Moved in the Last Year: 0    Homeless in the Last Year: No  Utilities: Not At Risk (11/09/2024)   Epic    Threatened with loss of utilities: No  Health Literacy: Adequate Health Literacy (07/07/2024)   B1300 Health Literacy    Frequency of need for help with medical instructions: Never    Family History:   Family History  Problem Relation Age of Onset   Dementia Mother    Heart disease Father    Cancer Father        throat   Cancer Sister        breast   Breast cancer Sister    Lung cancer Grandson    Colon cancer Neg Hx    Lung disease Neg Hx    Autoimmune disease Neg Hx      ROS:  Please see the history of present illness.  All other ROS reviewed and negative.     Physical Exam/Data: Vitals:   11/12/24 0332 11/12/24 1316 11/12/24 2114 11/13/24 0459  BP: 130/63 (!) 114/59 (!) 126/55 132/64  Pulse: 83 83 86 100  Resp: 16 16 18 18   Temp: 97.6 F (36.4 C) 98.2 F (36.8 C) 100 F (37.8 C) 100.1 F (37.8 C)  TempSrc: Oral   Oral Oral  SpO2: 96% 99% 96% 98%  Weight:      Height:  Intake/Output Summary (Last 24 hours) at 11/13/2024 0846 Last data filed at 11/12/2024 1828 Gross per 24 hour  Intake 350 ml  Output 100 ml  Net 250 ml      11/10/2024   10:39 PM 11/09/2024   11:36 AM 11/09/2024   10:52 AM  Last 3 Weights  Weight (lbs) 176 lb 12.9 oz 170 lb 170 lb  Weight (kg) 80.2 kg 77.111 kg 77.111 kg     Body mass index is 29.42 kg/m.  General:  Confused HEENT: normal Neck: no JVD Vascular: No carotid bruits; Distal pulses 2+ bilaterally Cardiac:  normal S1, S2; RRR. Intermittent ectopy. 2/6 murmur Lungs:  clear to auscultation bilaterally, no wheezing, rhonchi or rales  Abd: soft, nontender, no hepatomegaly  Ext: 1+ LLE Musculoskeletal:  No deformities, BUE and BLE strength normal and equal Skin: warm and dry  Neuro:  CNs 2-12 intact, no focal abnormalities noted Psych:  Normal affect  EKG:  The EKG was personally reviewed and demonstrates: Currently sinus rhythm with first-degree AV block.  PR interval 230.  Other EKGs demonstrating A-fib RVR/SVT with rate related changes. Telemetry:  Telemetry was personally reviewed and demonstrates: See above.  Atrial fibrillation/SVT, NSR.  Relevant CV Studies: Echocardiogram 05/05/2024 1. Left ventricular ejection fraction, by estimation, is 55 to 60%. The  left ventricle has normal function. The left ventricle has no regional  wall motion abnormalities. There is mild concentric left ventricular  hypertrophy. Left ventricular diastolic  function could not be evaluated.   2. Right ventricular systolic function is mildly reduced. The right  ventricular size is mildly enlarged. There is normal pulmonary artery  systolic pressure. The estimated right ventricular systolic pressure is  35.9 mmHg.   3. Left atrial size was severely dilated.   4. Right atrial size was severely dilated.   5. The mitral valve is abnormal. Mild mitral valve  regurgitation. No  evidence of mitral stenosis.   6. Tricuspid valve regurgitation is mild to moderate.   7. The aortic valve is tricuspid. There is mild calcification of the  aortic valve. Aortic valve regurgitation is not visualized. Aortic valve  sclerosis/calcification is present, without any evidence of aortic  stenosis.   8. The inferior vena cava is normal in size with greater than 50%  respiratory variability, suggesting right atrial pressure of 3 mmHg.    Laboratory Data: High Sensitivity Troponin:  No results for input(s): TROPONINIHS in the last 720 hours.  Recent Labs  Lab 11/01/24 1259 11/09/24 1623 11/09/24 1833 11/10/24 0918 11/11/24 2250  TRNPT 42* 38* 41* 51* 46*      Chemistry Recent Labs  Lab 11/11/24 0515 11/11/24 2250 11/12/24 1427  NA 134* 132* 133*  K 3.9 3.6 4.6  CL 97* 95* 97*  CO2 28 27 26   GLUCOSE 111* 170* 134*  BUN 18 20 21   CREATININE 1.65* 1.78* 2.06*  CALCIUM  8.5* 8.3* 8.5*  MG 1.9 1.6* 2.4  GFRNONAA 30* 27* 23*  ANIONGAP 9 10 11     Recent Labs  Lab 11/09/24 1023 11/09/24 1623 11/10/24 0106  PROT 7.0 6.5 5.9*  ALBUMIN 3.2* 3.0* 2.7*  AST 20 18 16   ALT 12 10 7   ALKPHOS 109 106 96  BILITOT 0.4 0.2 0.2   Lipids No results for input(s): CHOL, TRIG, HDL, LABVLDL, LDLCALC, CHOLHDL in the last 168 hours.  Hematology Recent Labs  Lab 11/10/24 1703 11/11/24 0204 11/11/24 2250  WBC 12.6* 12.8* 15.2*  RBC 2.77* 2.68* 2.81*  HGB 9.0* 8.8*  9.2*  HCT 28.2* 27.7* 28.2*  MCV 101.8* 103.4* 100.4*  MCH 32.5 32.8 32.7  MCHC 31.9 31.8 32.6  RDW 15.4 15.9* 15.2  PLT 390 411* 377   Thyroid   Recent Labs  Lab 11/10/24 0106  TSH 1.610    BNP Recent Labs  Lab 11/09/24 1623  PROBNP 3,009.0*    DDimer No results for input(s): DDIMER in the last 168 hours.  Radiology/Studies:  VAS US  LOWER EXTREMITY VENOUS (DVT) (7a-7p) Result Date: 11/09/2024  Lower Venous DVT Study Patient Name:  KELIAH HARNED  Date of Exam:    11/09/2024 Medical Rec #: [id number]        Accession #:    [id number] Date of Birth: Jan 26, 1936        Patient Gender: F Patient Age:   46 years Exam Location:  [facility] Procedure:      VAS US  LOWER EXTREMITY VENOUS (DVT) Referring Phys: KRISTIE HORTON --------------------------------------------------------------------------------  Indications: Swelling.  Risk Factors: Cancer. Limitations: Poor ultrasound/tissue interface. Comparison Study: No prior studies. Performing Technologist: Cordella Collet RVT  Examination Guidelines: A complete evaluation includes B-mode imaging, spectral Doppler, color Doppler, and power Doppler as needed of all accessible portions of each vessel. Bilateral testing is considered an integral part of a complete examination. Limited examinations for reoccurring indications may be performed as noted. The reflux portion of the exam is performed with the patient in reverse Trendelenburg.  +---------+---------------+---------+-----------+----------+--------------+ RIGHT    CompressibilityPhasicitySpontaneityPropertiesThrombus Aging +---------+---------------+---------+-----------+----------+--------------+ CFV      Full           Yes      Yes                                 +---------+---------------+---------+-----------+----------+--------------+ SFJ      Full                                                        +---------+---------------+---------+-----------+----------+--------------+ FV Prox  Full                                                        +---------+---------------+---------+-----------+----------+--------------+ FV Mid   Full                                                        +---------+---------------+---------+-----------+----------+--------------+ FV DistalFull                                                        +---------+---------------+---------+-----------+----------+--------------+ PFV      Full                                                         +---------+---------------+---------+-----------+----------+--------------+  POP      Full           Yes      Yes                                 +---------+---------------+---------+-----------+----------+--------------+ PTV      Full                                                        +---------+---------------+---------+-----------+----------+--------------+ PERO     Full                                                        +---------+---------------+---------+-----------+----------+--------------+   +---------+---------------+---------+-----------+----------+--------------+ LEFT     CompressibilityPhasicitySpontaneityPropertiesThrombus Aging +---------+---------------+---------+-----------+----------+--------------+ CFV      Full           Yes      Yes                                 +---------+---------------+---------+-----------+----------+--------------+ SFJ      Full                                                        +---------+---------------+---------+-----------+----------+--------------+ FV Prox  Full                                                        +---------+---------------+---------+-----------+----------+--------------+ FV Mid                  Yes      Yes                                 +---------+---------------+---------+-----------+----------+--------------+ FV Distal               Yes      Yes                                 +---------+---------------+---------+-----------+----------+--------------+ PFV      Full                                                        +---------+---------------+---------+-----------+----------+--------------+ POP      Full           Yes      Yes                                 +---------+---------------+---------+-----------+----------+--------------+  PTV      Full                                                         +---------+---------------+---------+-----------+----------+--------------+ PERO     Full                                                        +---------+---------------+---------+-----------+----------+--------------+     Summary: RIGHT: - There is no evidence of deep vein thrombosis in the lower extremity.  - No cystic structure found in the popliteal fossa.  LEFT: - There is no evidence of deep vein thrombosis in the lower extremity.  - No cystic structure found in the popliteal fossa.  *See table(s) above for measurements and observations. Electronically signed by Debby Robertson on 11/09/2024 at 10:25:28 PM.    Final    DG Chest Port 1 View Result Date: 11/09/2024 CLINICAL DATA:  Shortness of breath. EXAM: PORTABLE CHEST 1 VIEW COMPARISON:  Chest radiograph dated 11/01/2024. FINDINGS: Right-sided Port-A-Cath in similar position. No focal consolidation, pleural effusion or pneumothorax. Stable cardiac silhouette. No acute osseous pathology. Multiple surgical clips over the upper abdomen. IMPRESSION: No active disease. Electronically Signed   By: Vanetta Chou M.D.   On: 11/09/2024 16:42     Assessment and Plan:  Permanent atrial fibrillation PSVT Previously felt to be permanent atrial fibrillation however on telemetry here she has been predominantly sinus rhythm.  She has had intermittent episodes of A-fib RVR/SVT in which she has been asymptomatic.  Episodes are relatively short,  longest episodes lasting between 1 to 2 hours at a time.    Overall would manage conservatively as she is going to discharge to hospice.  Currently in sinus rhythm. PTA she was on Eliquis , but not currently on.  Suspect likely related to anemia felt secondary to vaginal bleeding requiring 1 unit of PRBC.  Do not see any immediate family noted on chart or in the room, would defer to POA and MD to discuss continued use of Eliquis . Hemoglobin seems stable now.  Continue with Toprol -XL 37.5 mg.  Can increase to  twice daily dosing if she has more RVR related events. Would avoid amiodarone given underlying pulmonary fibrosis although could be considered palliatively if we have run into issues with BP ands since overall life expentancy is poor Either way, no plans for DCCV given severely dilated atria and early recurrence of afib previosly, even if she turns more persistent.  Continue to supplement magnesium  as needed  Chronic HFpEF Appears euvolemic with some mild lower extremity edema on the left side.  Negative DVT.  Has no respiratory complaints. Normally on torsemide  but she has had AKI here second to obstructive uropathy with uptrending creatinine.  Okay to hold for now.  Chest pain Confused currently but denied any symptoms to me.  Troponins are flat in the 30s and 40s.  Would manage conservatively.  Acute UTI Metabolic encephalopathy On antibiotics and very confused.   AKI secondary to obstructive uropathy/prerenal Has uptrending creatinine.  Hold diuretic  Anemia Presented with vaginal bleeding.  Status post 1 unit PRBC  GOC Plan to DC to hospice.  Risk Assessment/Risk Scores:   New York  Heart Association (NYHA) Functional Class NYHA Class II  CHA2DS2-VASc Score = 5   This indicates a 7.2% annual risk of stroke. The patient's score is based upon: CHF History: 1 HTN History: 1 Diabetes History: 0 Stroke History: 0 Vascular Disease History: 0 Age Score: 2 Gender Score: 1     For questions or updates, please contact [facility] Please consult [email or web address] for contact info under      Signed, Thom LITTIE Sluder, PA-C  11/13/2024 8:46 AM   Personally seen and examined. Agree with above.  88 year old with stage IV metastatic vaginal cancer chronic diastolic heart failure with paroxysmal atrial fibrillation pulmonary fibrosis seen for evaluation of paroxysmal atrial fibrillation.  Here thankfully she is predominantly been in normal sinus rhythm.  I do believe  in the past she was thought to be permanent A-fib.  Hemoglobin 6.8 required 1 unit of packed red blood cells.  Plan is to discharge to hospice, DNR.  Encephalopathy.  Echo in June 2025 showed EF of 60% with dilated atria.  Troponin flat in the 40s.  Creatinine ranging from 1.6-2.0.  Currently resting comfortably. Irreg irreg.   Paroxysmal atrial fibrillation - Currently predominantly sinus rhythm with intermittent episodes of A-fib RVR/PSVT asymptomatic.  May be a degree of atrial tachycardia as well.  With her hemoglobin requiring packed red blood cells, I would be hesitant to utilize long-term anticoagulation with Eliquis  at this time especially if she is planning to be discharged on hospice.  Try to minimize bleeding risks. - Continue with Toprol -XL nut will increase from 37.5 mg to 50 mg - Try to avoid amiodarone given her pulmonary fibrosis.  Obviously if palliation is needed we can utilize this if necessary.  Chronic diastolic heart failure - Mild lower extremity edema on left side.  Negative DVT.  Normally on torsemide  but AKI noted here.  Obstructive uropathy.  Okay to hold loop diuretic for now.  Seems euvolemic.  Chest pain - Troponins flat.  Myocardial injury in the setting of her underlying cancer/anemia.  Troponins are in the 40s.  No further evaluation necessary.  We will go ahead and sign off.  Please let us  know if we can be of further assistance.  Oneil Parchment, MD     [1]  Allergies Allergen Reactions   Aspirin Other (See Comments)    Caused internal bleeding. Patient had ulcer at the time. Instructed by PCP - Nancey not to take any longer.   Erythromycin Shortness Of Breath   Ibuprofen Other (See Comments)    Instructed not to take by PCP - Nancey due to reaction to aspirin as well (precautionary).

## 2024-11-13 NOTE — Plan of Care (Signed)
  Problem: Fluid Volume: Goal: Hemodynamic stability will improve Outcome: Progressing   Problem: Clinical Measurements: Goal: Diagnostic test results will improve Outcome: Progressing Goal: Signs and symptoms of infection will decrease Outcome: Progressing   Problem: Respiratory: Goal: Ability to maintain adequate ventilation will improve Outcome: Progressing   Problem: Fluid Volume: Goal: Hemodynamic stability will improve Outcome: Progressing   Problem: Clinical Measurements: Goal: Diagnostic test results will improve Outcome: Progressing Goal: Signs and symptoms of infection will decrease Outcome: Progressing   Problem: Respiratory: Goal: Ability to maintain adequate ventilation will improve Outcome: Progressing

## 2024-11-13 NOTE — Care Management Important Message (Signed)
 Important Message  Patient Details  Name: [name] MRN: [id number] Date of Birth: [birth year]   Important Message Given:  Yes - Medicare IM (copy mailed to address on file)     [name] Ada 11/13/2024, 2:17 PM

## 2024-11-13 NOTE — Progress Notes (Signed)
 Physical Therapy Treatment Patient Details Name: [name] MRN: [id number] DOB: August 29, 1936 Today's Date: 11/13/2024   History of Present Illness 88 y.o. female admitted on 11/09/2024 from cancer center with increasing left lower extremity edema, chest tightness, and palpitations. Workup indicates UTI/sepsis. with past medical history of stage IV vaginal cancer s/p concurrent chemoradiation (completed 07/2024), diastolic CHF, osteoarthritis hypothyroidism, hypertension, atrial fibrillation on Eliquis , depression, and osteoporosis    PT Comments  PT - Cognition Comments: AxO x 3 pleasant Lady.  Feeling very bad and c/o frontal heache.  Grimacing.  Visibly feeling poorly. Required Total Assist + 2 to transfer to seated EOB.  Used bed pad to assist with swival/scoot.  Pt c/o MAX frontal HA and mild dizziness.  Nurse present during session.  BP light 92/68 and RA 99%.  Assisted back to supine and positioned to comfort.  Also assisted with breakfast tray set.    Per chart review, Pt plans to return home and resume Hospice Services.     If plan is discharge home, recommend the following: Two people to help with walking and/or transfers;A lot of help with bathing/dressing/bathroom;Assistance with cooking/housework;Assist for transportation;Help with stairs or ramp for entrance   Can travel by private vehicle        Equipment Recommendations  [place] lift;Hospital bed    Recommendations for Other Services       Precautions / Restrictions Precautions Precautions: Fall Precaution/Restrictions Comments: falls at home Restrictions Weight Bearing Restrictions Per Provider Order: No     Mobility  Bed Mobility Overal bed mobility: Needs Assistance Bed Mobility: Supine to Sit, Sit to Supine     Supine to sit: Max assist, +2 for physical assistance, +2 for safety/equipment Sit to supine: Max assist, Total assist, +2 for physical assistance, +2 for safety/equipment   General bed mobility  comments: required increased assist and only able to tolerate sitting EOX x 2 min due to Max c/o frontal HA and mild dizziness.  Nurse present during session.  BP light 92/68 and RA 99%.    Transfers                   General transfer comment: returned back to bed and positioned to comfort.    Ambulation/Gait                   Stairs             Wheelchair Mobility     Tilt Bed    Modified Rankin (Stroke Patients Only)       Balance                                            Communication Communication Communication: No apparent difficulties Factors Affecting Communication: Hearing impaired  Cognition Arousal: Alert     PT - Cognitive impairments: No apparent impairments                       PT - Cognition Comments: AxO x 3 pleasant Lady.  Feeling very bad and c/o frontal heache.  Grimacing.  Visibly feeling poorly. Following commands: Intact      Cueing Cueing Techniques: Verbal cues  Exercises      General Comments        Pertinent Vitals/Pain Pain Assessment Pain Assessment: Faces Pain Location: HA Pain Descriptors / Indicators: Crying, Constant Pain Intervention(s):  Monitored during session, Patient requesting pain meds-RN notified    Home Living                          Prior Function            PT Goals (current goals can now be found in the care plan section) Progress towards PT goals: Progressing toward goals    Frequency    Min 2X/week      PT Plan      Co-evaluation              AM-PAC PT 6 Clicks Mobility   Outcome Measure  Help needed turning from your back to your side while in a flat bed without using bedrails?: Total Help needed moving from lying on your back to sitting on the side of a flat bed without using bedrails?: Total Help needed moving to and from a bed to a chair (including a wheelchair)?: Total Help needed standing up from a chair using  your arms (e.g., wheelchair or bedside chair)?: Total Help needed to walk in hospital room?: Total Help needed climbing 3-5 steps with a railing? : Total 6 Click Score: 6    End of Session Equipment Utilized During Treatment: Gait belt Activity Tolerance: Patient limited by fatigue;Patient limited by pain Patient left: in bed;with call bell/phone within reach;with nursing/sitter in room Nurse Communication: Mobility status PT Visit Diagnosis: Other abnormalities of gait and mobility (R26.89);Difficulty in walking, not elsewhere classified (R26.2)     Time: 8987-8961 PT Time Calculation (min) (ACUTE ONLY): 26 min  Charges:    $Therapeutic Activity: 23-37 mins PT General Charges $$ ACUTE PT VISIT: 1 Visit                     {Nykerria Macconnell  PTA Acute  Rehabilitation Services Office M-F          [phone]

## 2024-11-13 NOTE — Plan of Care (Signed)
°  Problem: Fluid Volume: Goal: Hemodynamic stability will improve 11/13/2024 1310 by Alaina Dozier PARAS, RN Outcome: Adequate for Discharge 11/13/2024 0853 by Alaina Dozier PARAS, RN Outcome: Progressing   Problem: Clinical Measurements: Goal: Diagnostic test results will improve 11/13/2024 1310 by Alaina Dozier PARAS, RN Outcome: Adequate for Discharge 11/13/2024 0853 by Alaina Dozier PARAS, RN Outcome: Progressing Goal: Signs and symptoms of infection will decrease 11/13/2024 1310 by Alaina Dozier PARAS, RN Outcome: Adequate for Discharge 11/13/2024 0853 by Alaina Dozier PARAS, RN Outcome: Progressing   Problem: Respiratory: Goal: Ability to maintain adequate ventilation will improve 11/13/2024 1310 by Alaina Dozier PARAS, RN Outcome: Adequate for Discharge 11/13/2024 0853 by Alaina Dozier PARAS, RN Outcome: Progressing   Problem: Education: Goal: Knowledge of General Education information will improve Description: Including pain rating scale, medication(s)/side effects and non-pharmacologic comfort measures Outcome: Adequate for Discharge   Problem: Health Behavior/Discharge Planning: Goal: Ability to manage health-related needs will improve Outcome: Adequate for Discharge   Problem: Clinical Measurements: Goal: Ability to maintain clinical measurements within normal limits will improve Outcome: Adequate for Discharge Goal: Will remain free from infection Outcome: Adequate for Discharge Goal: Diagnostic test results will improve Outcome: Adequate for Discharge Goal: Respiratory complications will improve Outcome: Adequate for Discharge Goal: Cardiovascular complication will be avoided Outcome: Adequate for Discharge   Problem: Activity: Goal: Risk for activity intolerance will decrease Outcome: Adequate for Discharge   Problem: Nutrition: Goal: Adequate nutrition will be maintained Outcome: Adequate for Discharge   Problem: Coping: Goal:  Level of anxiety will decrease Outcome: Adequate for Discharge   Problem: Elimination: Goal: Will not experience complications related to bowel motility Outcome: Adequate for Discharge Goal: Will not experience complications related to urinary retention Outcome: Adequate for Discharge   Problem: Pain Managment: Goal: General experience of comfort will improve and/or be controlled Outcome: Adequate for Discharge   Problem: Safety: Goal: Ability to remain free from injury will improve Outcome: Adequate for Discharge   Problem: Skin Integrity: Goal: Risk for impaired skin integrity will decrease Outcome: Adequate for Discharge

## 2024-11-13 NOTE — Telephone Encounter (Signed)
 Called granddaughter to see if it okay to cancel PET scan on 12/26. Soleil is being d/ced home with hospice. Per [name], ok to cancel. PET scan canceled.

## 2024-11-14 ENCOUNTER — Inpatient Hospital Stay

## 2024-11-14 LAB — CULTURE, BLOOD (ROUTINE X 2): Culture: NO GROWTH

## 2024-11-15 ENCOUNTER — Inpatient Hospital Stay

## 2024-11-15 LAB — CULTURE, BLOOD (ROUTINE X 2): Culture: NO GROWTH

## 2024-11-16 ENCOUNTER — Inpatient Hospital Stay

## 2024-11-17 ENCOUNTER — Inpatient Hospital Stay

## 2024-11-24 ENCOUNTER — Other Ambulatory Visit (HOSPITAL_COMMUNITY)

## 2024-11-26 NOTE — Progress Notes (Deleted)
 "  GI Office Note    Referring Provider: [name], MD Primary Care Physician:  [name], MD Primary Gastroenterologist: Lamar HERO.Rourk, MD  Date:  11/26/2024  ID:  [name], DOB Mar 14, 1936, MRN [id number]   Chief Complaint   No chief complaint on file.  History of Present Illness  [name] is a 88 y.o. female with a history of vaginal cancer, depression, pulmonary fibrosis, osteopenia, HTN, hypothyroidism, leukemia, CHF, and A-fib on Eliquis  presenting today with complaint of ***.   Colonoscopy 2015: Sessile polyp in the cecum, remainder of colon normal.   Last seen in the office June 2017 by Dr. Shaaron.  She presented for follow-up of her reflux.  Noted history of colonic adenomas removed.  She was doing well on pantoprazole  40 mg every other day and having trouble with her weight.  She was trying to obtain a more healthy BMI and she started walking with her husband every day.  Denied any melena or rectal bleeding.  No abdominal pain dysphagia or odynophagia.  She is advised continue pantoprazole  40 mg daily to every other day and office visit 1 year or as needed.  Reflux information provided.  During this visit it was noted that she had a history of heme positive stool a year prior with recent EGD and colonoscopy up-to-date.   Per review of her chart she has recently been undergoing radiation for vaginal cancer.  Last session 07/05/2024.   See PCP 07/26/2024 noting a single bout of melena on 8/25 without abdominal pain.  Taking her Protonix  and holding her Eliquis .  Overall feeling well.  No additional bowel movement since the 1 episode of melena.  Cystitis symptoms improving on Cipro .  iFOBT + 07/26/2024.   PCP visit 08/02/2024 again noting frequent urination with small amounts.  Having fullness and discomfort in the bladder area.  In regards to bowel movements she had a significant could have melena that occurred unexpectedly in the shower which was alarming  embarrassing as a send stools have been darker than normal but not as severe as the prior episode.  Advised to continue holding blood thinner.   OV 08/08/2024.  Reported 2 episodes of melena which occurred as loose stool that happened in the shower without any warning.  After all this her bowel movements returned to normal color and consistency has been dark brown and she denied any abdominal pain changes appetite chest pain or shortness of breath.  When the first episode happened in the shower she did have some mild abdominal cramping but this resolved.  She also reports soreness in her perineal area. Advised ongoing follow-up with oncology and GYN.  Resume Eliquis  and monitor for melena.  Advised CBC and iron panel in 2 weeks.  Consider endoscopic evaluation of black stool reoccurs or significant drop in hemoglobin.  Consider iron supplementation if anemia does not improve.   On 10/8 patient requested test to see if she has blood present in her stool, had been having dark brown watery stools.  Advise FOBT x 3 to assess for presence of blood in the stool.  On 10/9 patient reported still having watery stools and feeling weak.  Advised ED evaluation if she began to feel worse.  Last office visit 09/13/2024.***. Advised on EGD and Colonoscopy for evaluation of anemia. Advised PPI BID. Advised to avoid NSAIDs, use imodium as needed. Advised ongoing follow up with GYN and oncology.   Hospitalized at [facility] 09/17/2024 - 09/20/24 and seen for  consultation by our team on 09/18/24. She underwent EGD and colonoscopy inpatient as noted below. She presented with vaginal and rectal bleeding.   EGD 09/19/2024: - 9 cm hiatal hernia with a few Cameron ulcers.  - Normal stomach.  - Normal examined duodenum.  - No specimens collected. - Use Pantoprazole  40 mg BID  Colonoscopy 09/19/2024: - Tortuous colon.  - Non- bleeding internal hemorrhoids.  - No specimens collected. - Upon scope withdrawal, it was noticed the  patient had a gush of fresh blood. - Reinspection showed a clot adhered to internal hemorrhoids. - Start daily miralax  and advised anusol  suppositories BID for 7 days, resume Eliquis  in 24 hours.   Admitted to [facility] 11/09/24-11/13/24 for chest pain. She was noted to have a UTI and treated and seen by palliative. Discussions with patient and family revealed plan for treating issues and discharge home with hospice. Her eliquis  was restarted. Appears she had drop in hgb during this hospitalization and received some blood. Concern for vulval vesicular fistula and had had some vaginal bleeding. For her Afib for BB was titrated.   Today:     Wt Readings from Last 6 Encounters:  11/10/24 176 lb 12.9 oz (80.2 kg)  11/09/24 170 lb (77.1 kg)  11/09/24 170 lb (77.1 kg)  11/01/24 175 lb (79.4 kg)  10/26/24 176 lb 12.8 oz (80.2 kg)  10/12/24 176 lb 12.8 oz (80.2 kg)    There is no height or weight on file to calculate BMI.   Current Outpatient Medications  Medication Sig Dispense Refill   albuterol  (VENTOLIN  HFA) 108 (90 Base) MCG/ACT inhaler INHALE 2 PUFFS INTO THE LUNGS EVERY 6 HOURS AS NEEDED FOR WHEEZING OR SHORTNESS OF BREATH 18 g 1   ALPRAZolam  (XANAX ) 0.5 MG tablet Take 1 tablet (0.5 mg total) by mouth 3 (three) times daily as needed for anxiety. TAKE 1 TABLET BY MOUTH EVERY NIGHT AT BEDTIME. MAY TAKE 1/2 TABLET DURING THE DAY AS NEEDED 15 tablet 0   butalbital -acetaminophen -caffeine  (FIORICET ) 50-325-40 MG tablet Take 1 tablet by mouth every 6 (six) hours as needed for headache. 14 tablet 0   Cholecalciferol (VITAMIN D3) 2000 UNITS TABS Take 2,000 Units by mouth daily.     diphenoxylate -atropine  (LOMOTIL ) 2.5-0.025 MG tablet Take 1 tablet by mouth 4 (four) times daily as needed for diarrhea or loose stools.     levothyroxine  (SYNTHROID ) 125 MCG tablet TAKE 1 TABLET BY MOUTH DAILY (Patient taking differently: Take 125 mcg by mouth daily before breakfast.) 90 tablet 1   lidocaine -prilocaine   (EMLA ) cream Apply 1 Application topically as needed (for port access).     magnesium  gluconate (MAGONATE) 500 (27 Mg) MG TABS tablet Take 1 tablet (500 mg total) by mouth daily at 6 (six) AM. (Patient not taking: Reported on 11/09/2024) 30 tablet 2   metoprolol  succinate (TOPROL -XL) 50 MG 24 hr tablet Take 1 tablet (50 mg total) by mouth daily. Take with or immediately following a meal. 30 tablet 0   metoprolol  tartrate (LOPRESSOR ) 25 MG tablet Take 1 tablet (25 mg total) by mouth 2 (two) times daily as needed (For heart rate sustained over 120). 30 tablet 0   mirtazapine  (REMERON ) 15 MG tablet Take 1 tablet (15 mg total) by mouth at bedtime. 30 tablet 1   Multiple Vitamin (MULTI-VITAMIN DAILY PO) Take 1 tablet by mouth daily with breakfast.     ondansetron  (ZOFRAN -ODT) 4 MG disintegrating tablet Take 1 tablet (4 mg total) by mouth every 8 (eight) hours as needed  for nausea or vomiting (DISSOLVE ORALLY). 20 tablet 0   oxyCODONE  (OXY IR/ROXICODONE ) 5 MG immediate release tablet Take 0.5 tablets (2.5 mg total) by mouth every 4 (four) hours as needed for severe pain (pain score 7-10) (dyspnea). 10 tablet 0   pantoprazole  (PROTONIX ) 40 MG tablet TAKE 1 TABLET(40 MG) BY MOUTH DAILY (Patient taking differently: Take 40 mg by mouth daily before breakfast.) 90 tablet 1   polyethylene glycol (MIRALAX ) 17 g packet Take 17 g by mouth daily. (Patient not taking: Reported on 11/09/2024) 14 each 0   pregabalin  (LYRICA ) 25 MG capsule Take 50 mg by mouth in the morning.     SYSTANE 0.4-0.3 % SOLN Place 1 drop into both eyes 3 (three) times daily as needed (FOR IRRITATION).     TYLENOL  500 MG tablet Take 1,000 mg by mouth every 8 (eight) hours as needed for mild pain (pain score 1-3).     No current facility-administered medications for this visit.    Past Medical History:  Diagnosis Date   Arthritis    CHF (congestive heart failure) (HCC)    Chronic Leukemia    Depression    Dysrhythmia    A.fib   on  Eliquis    H/O hiatal hernia    Hernia    Hernia    right inguinal hernia   History of radiation therapy    Vagina- 08/21/24-08/25/24- Dr. Lynwood Nasuti   Hypertension    Hypothyroidism    Leg swelling    Osteitis pubis 05/30/2001   Osteopenia 03/30/2009   Osteopenia 07/30/2022   Bone density 2021 showed elevated FRAX patient was started on Fosamax    Pneumonia    Thyroid  disease     Past Surgical History:  Procedure Laterality Date   ABDOMINAL HYSTERECTOMY  03/1970   APPENDECTOMY  03/1966   BILATERAL SALPINGOOPHORECTOMY     CARDIOVERSION N/A 03/10/2022   Procedure: CARDIOVERSION;  Surgeon: Francyne Headland, MD;  Location: [facility];  Service: Cardiovascular;  Laterality: N/A;   CATARACT EXTRACTION W/PHACO Right 09/25/2014   Procedure: CATARACT EXTRACTION PHACO AND INTRAOCULAR LENS PLACEMENT (IOC);  Surgeon: Oneil T. Roz, MD;  Location: [facility];  Service: Ophthalmology;  Laterality: Right;  CDE:7.56   CATARACT EXTRACTION W/PHACO Left 10/09/2014   Procedure: CATARACT EXTRACTION PHACO AND INTRAOCULAR LENS PLACEMENT LEFT EYE CDE=6.51;  Surgeon: Oneil T. Roz, MD;  Location: [facility];  Service: Ophthalmology;  Laterality: Left;   CHOLECYSTECTOMY  1980   COLONOSCOPY  1999   COLONOSCOPY  March 2007   Repeat 10 years   COLONOSCOPY  11/06/2003    Internal and external hemorrhoids/Two tiny rectal distal sigmoid polyps cold biopsied/Otherwise within normal limits to the cecum   COLONOSCOPY N/A 08/29/2014   Dr. Shaaron: Sessile polyp was found at the cecum status post cold snare polypectomy, tubular adenoma. No further surveillance unless clinical status changes   COLONOSCOPY N/A 09/19/2024   Procedure: COLONOSCOPY;  Surgeon: Eartha Angelia Sieving, MD;  Location: [facility];  Service: Gastroenterology;  Laterality: N/A;   CYSTOSCOPY N/A 05/24/2024   Procedure: CYSTOSCOPY;  Surgeon: Viktoria Comer SAUNDERS, MD;  Location: [facility];  Service: Gynecology;  Laterality: N/A;    ESOPHAGOGASTRODUODENOSCOPY  2007   Dr. Shaaron: normal esophagus, small tom oderate sized hiatal hernia, Cameron lesions   ESOPHAGOGASTRODUODENOSCOPY N/A 09/19/2024   Procedure: EGD (ESOPHAGOGASTRODUODENOSCOPY);  Surgeon: Eartha Angelia, Sieving, MD;  Location: [facility];  Service: Gastroenterology;  Laterality: N/A;   EXAM UNDER ANESTHESIA, PELVIC N/A 05/24/2024   Procedure: EXAM UNDER  ANESTHESIA, PELVIC;  Surgeon: Viktoria Comer SAUNDERS, MD;  Location: [facility];  Service: Gynecology;  Laterality: N/A;   IR IMAGING GUIDED PORT INSERTION  05/23/2024   SKIN CANCER EXCISION Left 11 1 2016   left arm squamous cancer   TUMOR REMOVAL  03/1968   ovarian    Family History  Problem Relation Age of Onset   Dementia Mother    Heart disease Father    Cancer Father        throat   Cancer Sister        breast   Breast cancer Sister    Lung cancer Grandson    Colon cancer Neg Hx    Lung disease Neg Hx    Autoimmune disease Neg Hx     Allergies as of 11/27/2024 - Review Complete 11/09/2024  Allergen Reaction Noted   Aspirin Other (See Comments) 04/15/2012   Erythromycin Shortness Of Breath 04/15/2012   Ibuprofen Other (See Comments) 04/15/2012    Social History   Socioeconomic History   Marital status: Widowed    Spouse name: Not on file   Number of children: 1   Years of education: 47   Highest education level: Not on file  Occupational History   Occupation: Retired    Associate Professor: RETIRED    Comment: Ship Broker, 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   Widowed since 2021/07/08. Husband W.E. Santibanez, died from Kidney Cancer.   Son-Tommy lives nearby.   2 grandson's deceased, Clint and Penne. Penne passed 02/2022 from Brain Cancer.    Social Drivers of Health   Tobacco Use: Low Risk (11/10/2024)   Patient History    Smoking Tobacco Use: Never    Smokeless Tobacco Use: Never    Passive Exposure: Never  Financial Resource Strain: Low Risk (07/07/2024)   Overall Financial Resource Strain (CARDIA)    Difficulty of Paying Living Expenses: Not hard at all  Food Insecurity: No Food Insecurity (11/09/2024)   Epic    Worried About Programme Researcher, Broadcasting/film/video in the Last Year: Never true    Ran Out of Food in the Last Year: Never true  Transportation Needs: No Transportation Needs (11/09/2024)   Epic    Lack of Transportation (Medical): No    Lack of Transportation (Non-Medical): No  Physical Activity: Inactive (07/07/2024)   Exercise Vital Sign    Days of Exercise per Week: 0 days    Minutes of Exercise per Session: Not on file  Stress: No Stress Concern Present (07/07/2024)   [employer] of Occupational Health - Occupational Stress Questionnaire    Feeling of Stress: Only a little  Social Connections: Moderately Integrated (11/09/2024)   Social Connection and Isolation Panel    Frequency of Communication with Friends and Family: Twice a week    Frequency of Social Gatherings with Friends and Family: Twice a week    Attends Religious Services: More than 4 times per year    Active Member  of Clubs or Organizations: Yes    Attends Banker Meetings: 1 to 4 times per year    Marital Status: Widowed  Depression (PHQ2-9): Medium Risk (10/30/2024)   Depression (PHQ2-9)    PHQ-2 Score: 5  Alcohol  Screen: Low Risk (07/07/2024)   Alcohol  Screen    Last Alcohol  Screening Score (AUDIT): 0  Housing: Low Risk (11/09/2024)   Epic    Unable to Pay for Housing in the Last Year: No    Number of Times Moved in the Last Year: 0     Homeless in the Last Year: No  Utilities: Not At Risk (11/09/2024)   Epic    Threatened with loss of utilities: No  Health Literacy: Adequate Health Literacy (07/07/2024)   B1300 Health Literacy    Frequency of need for help with medical instructions: Never    Review of Systems   Gen: Denies fever, chills, anorexia. Denies fatigue, weakness, weight loss.  CV: Denies chest pain, palpitations, syncope, peripheral edema, and claudication. Resp: Denies dyspnea at rest, cough, wheezing, coughing up blood, and pleurisy. GI: See HPI Derm: Denies rash, itching, dry skin Psych: Denies depression, anxiety, memory loss, confusion. No homicidal or suicidal ideation.  Heme: Denies bruising, bleeding, and enlarged lymph nodes.  Physical Exam   There were no vitals taken for this visit.  General:   Alert and oriented. No distress noted. Pleasant and cooperative.  Head:  Normocephalic and atraumatic. Eyes:  Conjuctiva clear without scleral icterus. Mouth:  Oral mucosa pink and moist. Good dentition. No lesions. Lungs:  Clear to auscultation bilaterally. No wheezes, rales, or rhonchi. No distress.  Heart:  S1, S2 present without murmurs appreciated.  Abdomen:  +BS, soft, non-tender and non-distended. No rebound or guarding. No HSM or masses noted. Rectal: *** Msk:  Symmetrical without gross deformities. Normal posture. Extremities:  Without edema. Neurologic:  Alert and  oriented x4 Psych:  Alert and cooperative. Normal mood and affect.  Assessment & Plan  KHALEAH DUER is a 88 y.o. female presenting today with ***   Follow up   Follow up ***    Sheena Melia, MSN, FNP-BC, AGACNP-BC [facility] "

## 2024-11-27 ENCOUNTER — Ambulatory Visit: Admitting: Gastroenterology

## 2024-11-28 ENCOUNTER — Inpatient Hospital Stay

## 2024-11-28 ENCOUNTER — Inpatient Hospital Stay: Admitting: Hematology and Oncology

## 2024-11-29 ENCOUNTER — Encounter: Payer: Self-pay | Admitting: *Deleted

## 2024-11-30 DEATH — deceased

## 2024-12-11 ENCOUNTER — Ambulatory Visit: Payer: PPO | Admitting: Hematology

## 2024-12-11 ENCOUNTER — Other Ambulatory Visit: Payer: PPO

## 2025-07-13 ENCOUNTER — Ambulatory Visit
